# Patient Record
Sex: Female | Born: 1950 | Race: White | Hispanic: No | Marital: Single | Smoking: Never smoker
Health system: Southern US, Community
[De-identification: ages and names within clinical notes are randomized; demographics above are authoritative.]

## PROBLEM LIST (undated history)

## (undated) DIAGNOSIS — K529 Noninfective gastroenteritis and colitis, unspecified: Secondary | ICD-10-CM

## (undated) DIAGNOSIS — I272 Pulmonary hypertension, unspecified: Secondary | ICD-10-CM

## (undated) DIAGNOSIS — Z8719 Personal history of other diseases of the digestive system: Secondary | ICD-10-CM

## (undated) DIAGNOSIS — M1990 Unspecified osteoarthritis, unspecified site: Secondary | ICD-10-CM

## (undated) DIAGNOSIS — E079 Disorder of thyroid, unspecified: Secondary | ICD-10-CM

## (undated) DIAGNOSIS — C801 Malignant (primary) neoplasm, unspecified: Secondary | ICD-10-CM

## (undated) DIAGNOSIS — I509 Heart failure, unspecified: Secondary | ICD-10-CM

## (undated) DIAGNOSIS — J45909 Unspecified asthma, uncomplicated: Secondary | ICD-10-CM

## (undated) DIAGNOSIS — I77819 Aortic ectasia, unspecified site: Secondary | ICD-10-CM

## (undated) DIAGNOSIS — I480 Paroxysmal atrial fibrillation: Secondary | ICD-10-CM

## (undated) DIAGNOSIS — I77810 Thoracic aortic ectasia: Secondary | ICD-10-CM

## (undated) DIAGNOSIS — K219 Gastro-esophageal reflux disease without esophagitis: Secondary | ICD-10-CM

## (undated) DIAGNOSIS — I10 Essential (primary) hypertension: Secondary | ICD-10-CM

## (undated) DIAGNOSIS — E0590 Thyrotoxicosis, unspecified without thyrotoxic crisis or storm: Secondary | ICD-10-CM

## (undated) HISTORY — DX: Thyrotoxicosis, unspecified without thyrotoxic crisis or storm: E05.90

## (undated) HISTORY — DX: Paroxysmal atrial fibrillation: I48.0

## (undated) HISTORY — PX: OTHER SURGICAL HISTORY: SHX169

## (undated) HISTORY — DX: Pulmonary hypertension, unspecified: I27.20

## (undated) HISTORY — DX: Thoracic aortic ectasia: I77.810

## (undated) HISTORY — DX: Aortic ectasia, unspecified site: I77.819

---
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
## 1998-04-22 ENCOUNTER — Ambulatory Visit (HOSPITAL_COMMUNITY): Admission: RE | Admit: 1998-04-22 | Discharge: 1998-04-22 | Payer: Self-pay | Admitting: Obstetrics & Gynecology

## 1998-06-17 ENCOUNTER — Other Ambulatory Visit: Admission: RE | Admit: 1998-06-17 | Discharge: 1998-06-17 | Payer: Self-pay | Admitting: Obstetrics & Gynecology

## 1998-08-04 ENCOUNTER — Emergency Department (HOSPITAL_COMMUNITY): Admission: EM | Admit: 1998-08-04 | Discharge: 1998-08-04 | Payer: Self-pay

## 1998-08-08 ENCOUNTER — Encounter: Payer: Self-pay | Admitting: Internal Medicine

## 1999-01-07 ENCOUNTER — Other Ambulatory Visit: Admission: RE | Admit: 1999-01-07 | Discharge: 1999-01-07 | Payer: Self-pay | Admitting: Obstetrics & Gynecology

## 1999-06-17 ENCOUNTER — Ambulatory Visit (HOSPITAL_COMMUNITY): Admission: RE | Admit: 1999-06-17 | Discharge: 1999-06-17 | Payer: Self-pay | Admitting: Obstetrics & Gynecology

## 1999-06-17 ENCOUNTER — Encounter: Payer: Self-pay | Admitting: Obstetrics & Gynecology

## 1999-12-04 ENCOUNTER — Other Ambulatory Visit: Admission: RE | Admit: 1999-12-04 | Discharge: 1999-12-04 | Payer: Self-pay | Admitting: Obstetrics & Gynecology

## 1999-12-04 ENCOUNTER — Encounter (INDEPENDENT_AMBULATORY_CARE_PROVIDER_SITE_OTHER): Payer: Self-pay

## 1999-12-20 ENCOUNTER — Emergency Department (HOSPITAL_COMMUNITY): Admission: EM | Admit: 1999-12-20 | Discharge: 1999-12-20 | Payer: Self-pay | Admitting: Emergency Medicine

## 1999-12-23 ENCOUNTER — Encounter (HOSPITAL_COMMUNITY): Admission: RE | Admit: 1999-12-23 | Discharge: 1999-12-27 | Payer: Self-pay | Admitting: Emergency Medicine

## 2000-01-03 ENCOUNTER — Encounter (HOSPITAL_COMMUNITY): Admission: RE | Admit: 2000-01-03 | Discharge: 2000-01-03 | Payer: Self-pay | Admitting: Emergency Medicine

## 2000-01-17 ENCOUNTER — Emergency Department (HOSPITAL_COMMUNITY): Admission: EM | Admit: 2000-01-17 | Discharge: 2000-01-17 | Payer: Self-pay

## 2000-09-08 ENCOUNTER — Emergency Department (HOSPITAL_COMMUNITY): Admission: EM | Admit: 2000-09-08 | Discharge: 2000-09-09 | Payer: Self-pay | Admitting: Emergency Medicine

## 2000-09-08 ENCOUNTER — Encounter: Payer: Self-pay | Admitting: Emergency Medicine

## 2000-10-12 ENCOUNTER — Other Ambulatory Visit: Admission: RE | Admit: 2000-10-12 | Discharge: 2000-10-12 | Payer: Self-pay | Admitting: Obstetrics & Gynecology

## 2001-10-18 ENCOUNTER — Other Ambulatory Visit: Admission: RE | Admit: 2001-10-18 | Discharge: 2001-10-18 | Payer: Self-pay | Admitting: Obstetrics & Gynecology

## 2004-04-16 ENCOUNTER — Inpatient Hospital Stay (HOSPITAL_COMMUNITY): Admission: EM | Admit: 2004-04-16 | Discharge: 2004-04-22 | Payer: Self-pay | Admitting: Emergency Medicine

## 2004-04-16 ENCOUNTER — Encounter (INDEPENDENT_AMBULATORY_CARE_PROVIDER_SITE_OTHER): Payer: Self-pay | Admitting: *Deleted

## 2004-04-16 IMAGING — CR DG ABDOMEN ACUTE W/ 1V CHEST
3 series · 3 of 3 positions shown · non-contrast
Comparison: none

CLINICAL DATA: Nausea, vomiting, abdominal pain.
 ACUTE ABDOMEN SERIES WITH CHEST 
 The bowel gas pattern is normal.  There is no free peritoneal air.  The soft tissues have a normal appearance.  The upright chest view included with the study demonstrates borderline cardiac size.  There are no infiltrative or edematous changes. 
 IMPRESSION
 Borderline cardiac size.  Normal bowel gas pattern and no evidence for free peritoneal air.

[view not recorded (1 of 3)]
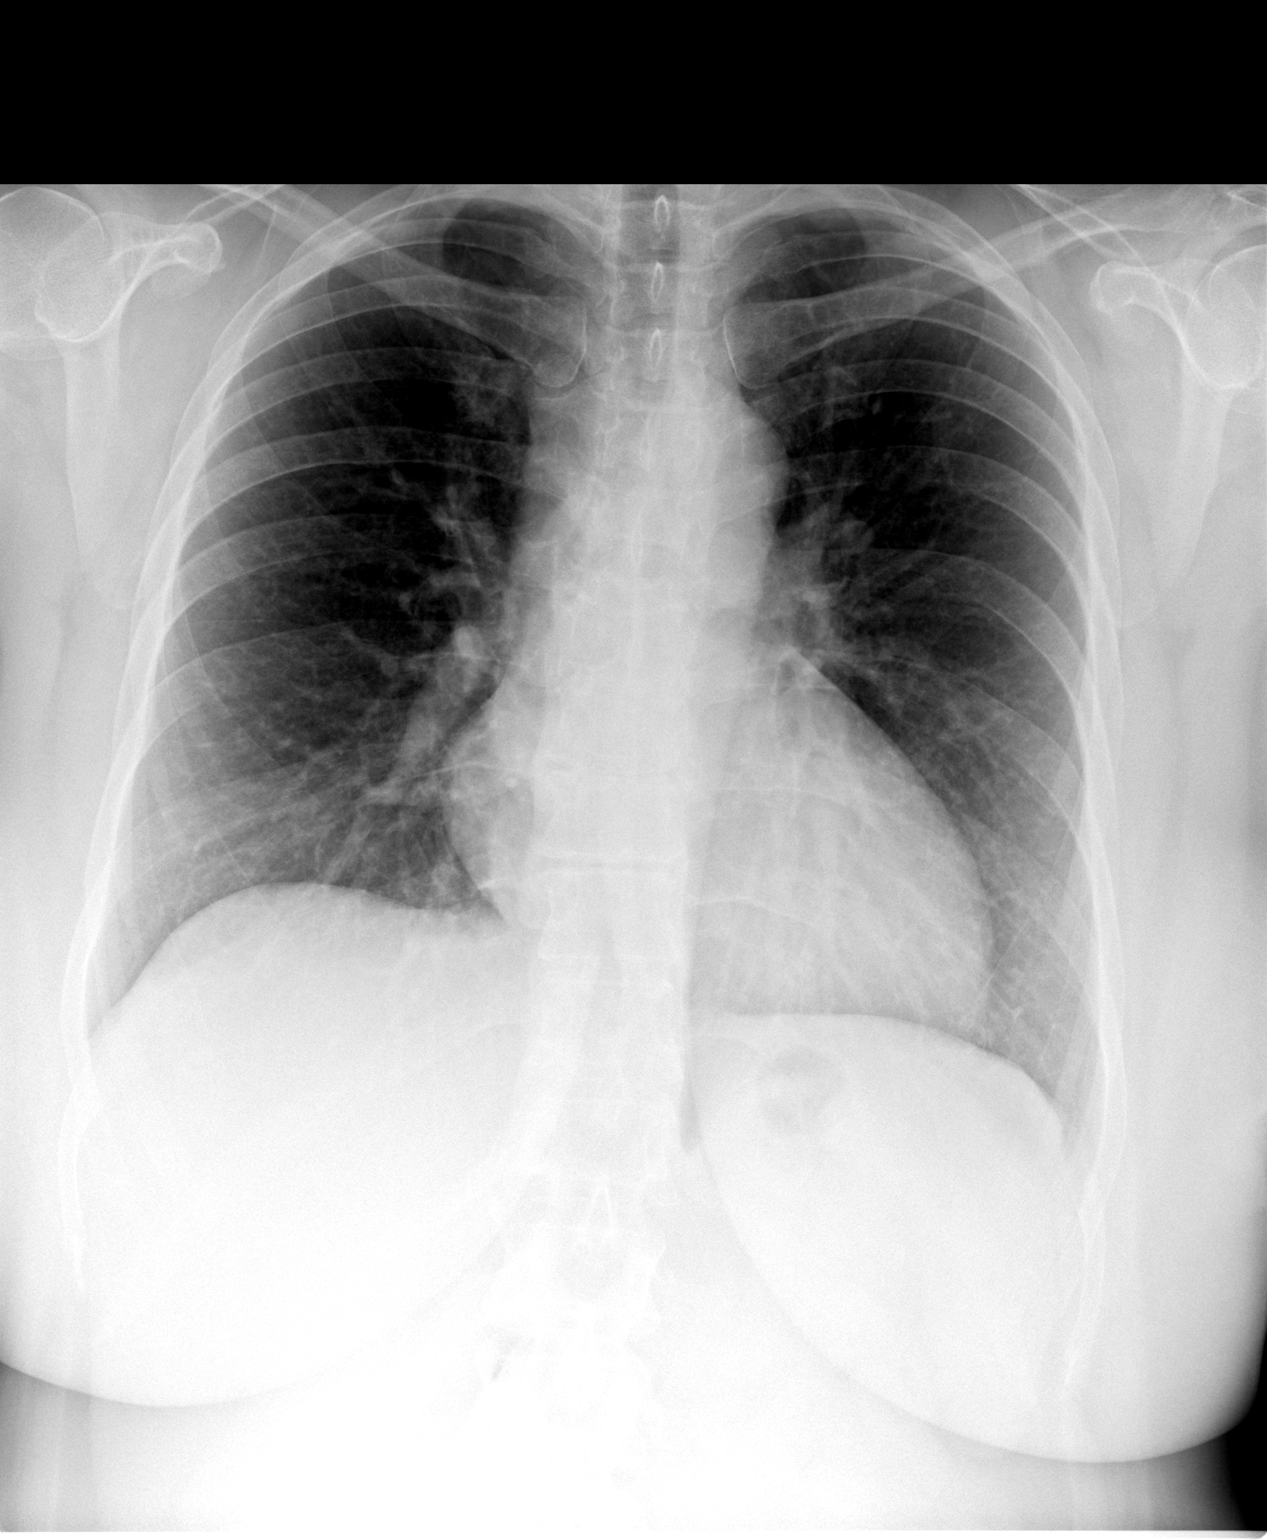

[view not recorded (2 of 3)]
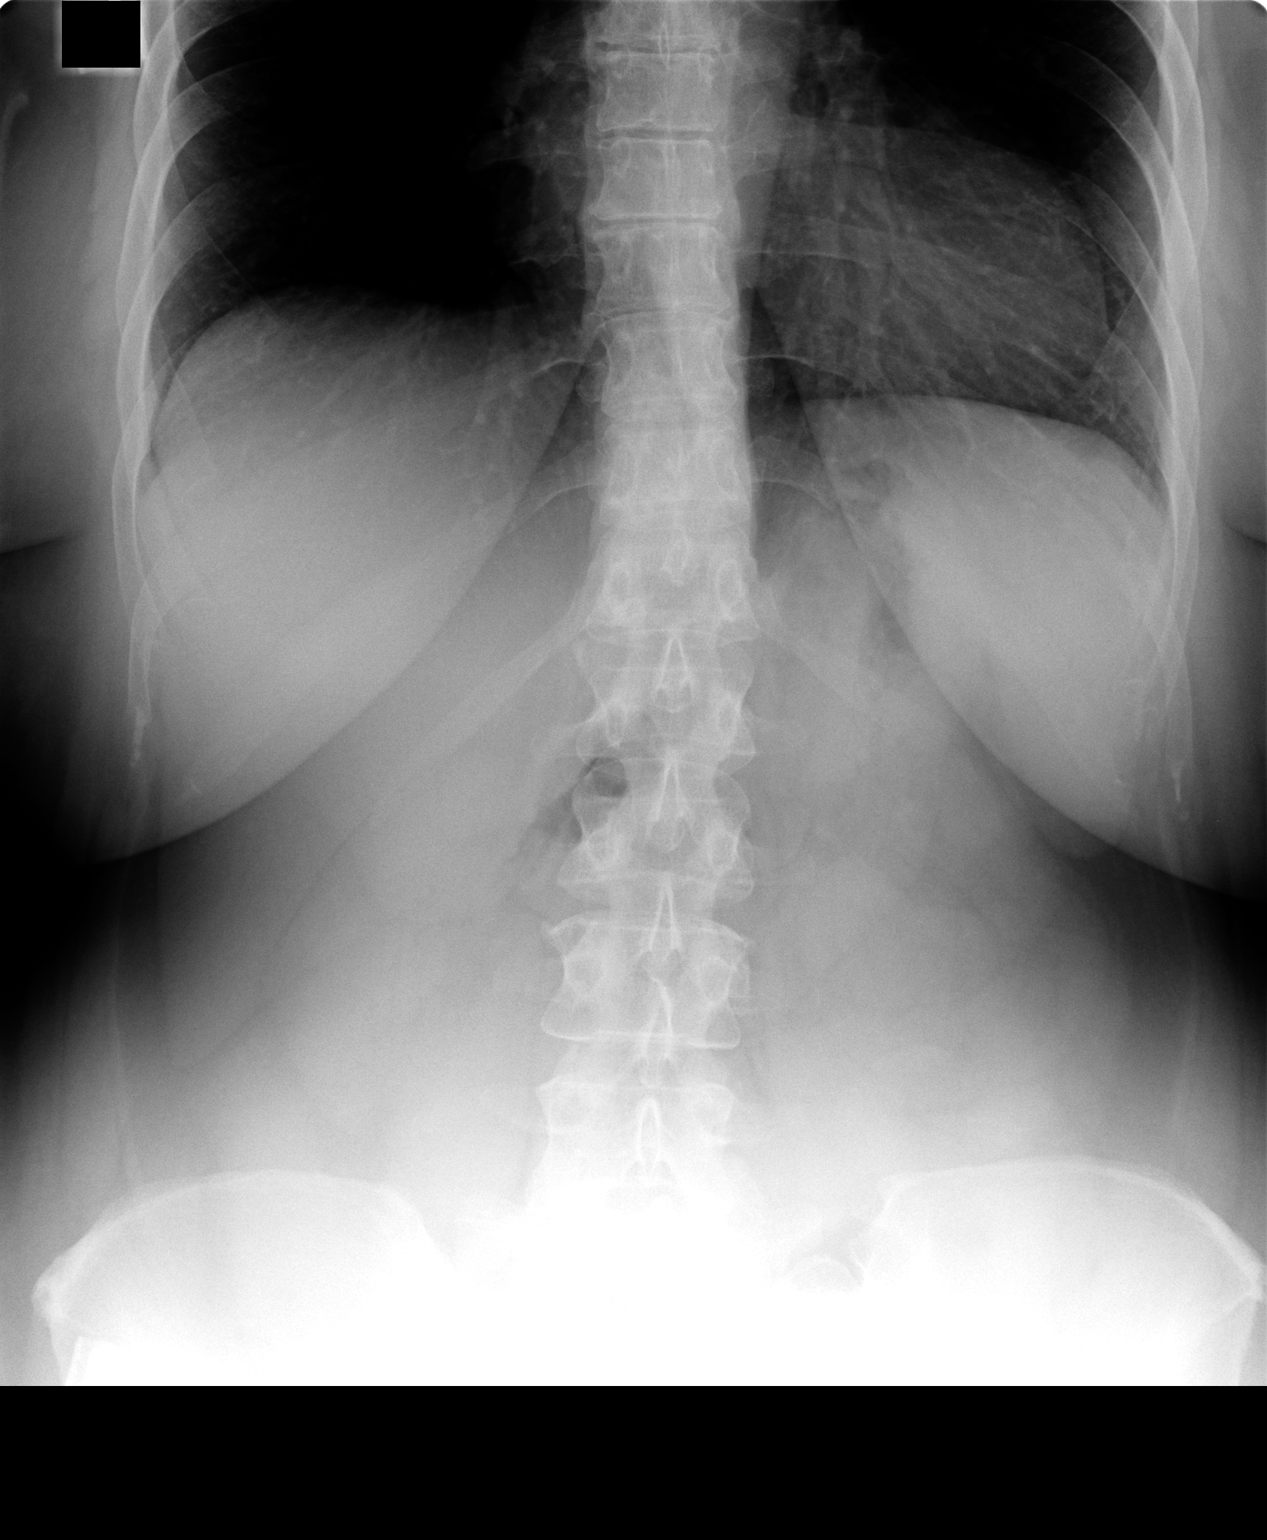

[view not recorded (3 of 3)]
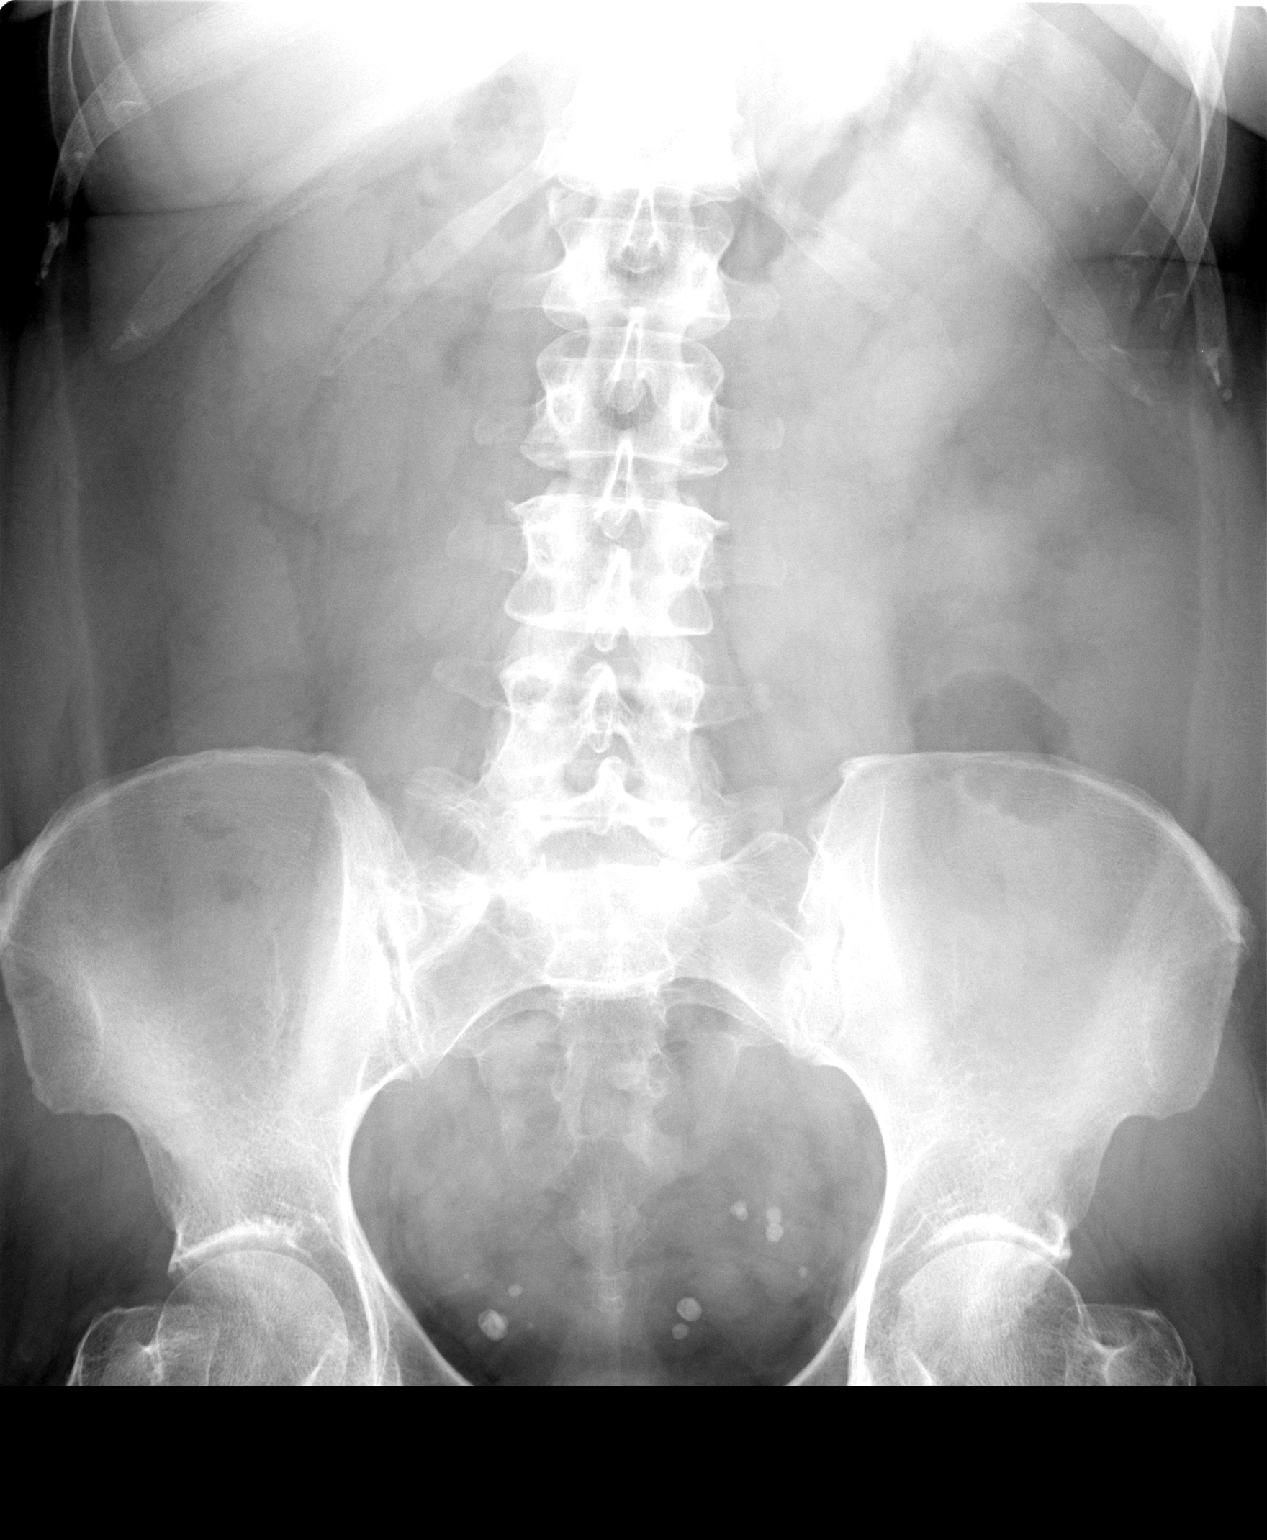

[3 of 3 positions shown; findings below may reference images not displayed]

## 2004-04-16 IMAGING — CT CT PELVIS W/ CM
1 of 6 series · 13 of 32 positions shown, 18 images · IV contrast (omnipaque)
Comparison: none

CLINICAL DATA: Marked diarrhea, diffuse abdominal pain, low grade fever.  Please evaluate.
 CT OF THE ABDOMEN WITH IV CONTRAST ? [DATE].
 Multidetector helical study was performed during IV contrast enhancement with 150 cc of Omnipaque 300.   
 There are two small cysts seen associated with the lower poles of the kidneys.  The kidneys otherwise have a normal appearance.  The liver, spleen, pancreas, and adrenal glands have a normal appearance.  
 There is bowel wall thickening associated with the right colon, particularly the cecum, and extending into the hepatic flexure region.  There is no contrast within the transverse colon and I am uncertain as to whether there is bile wall thickening within the transverse colon as well.  Significant bowel wall thickening is not see associated with the terminal ileum.  There are several mildly enlarged mesenteric lymph nodes seen medial to the cecum.  A lymph node measuring 6 x 17 mm in size is seen medial to the cecum on image sequence #53.  In this size range these may well represent reactive lymph nodes.
 IMPRESSION
 1.  Bowel wall thickening associated with the right colon and possibly extending into the transverse colon.  There is relative sparing of the terminal ileum and these changes favor ulcerative colitis.  The patient gives no history of antibiotic use to suggest pseudomembranous colitis.  
 2.  Several small lower pole right renal cysts.
 CT OF THE PELVIS WITH IV CONTRAST:
 Bowel wall thickening is seen associated with the cecum and right colon.  Please see CT of the abdomen for findings related to this.  There is no pelvic mass or adenopathy.  There is no evidence for diverticulitis.
 1.  Bowel wall thickening associated with the right colon.  Please see CT abdomen report for discussion with regards to these findings.
 2.  There are mildly enlarged mesenteric lymph nodes seen medial to the right colon most likely representing reactive lymph nodes.

[Series 5: — · axial · 0.63mm/px · z∈[-676,-282]mm · 13 of 91 slices shown, 18 images]
[im 6/91  soft-tissue]
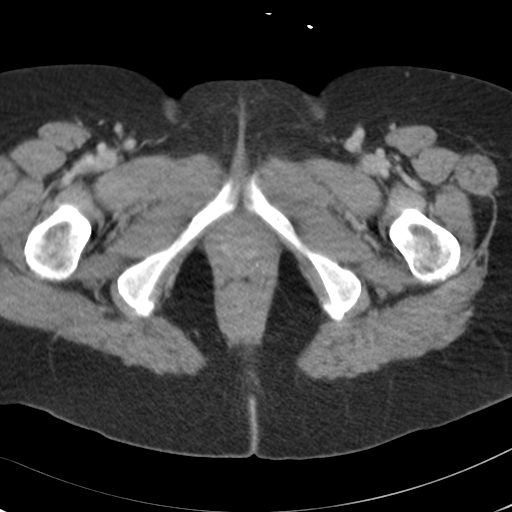
[im 6/91  bone]
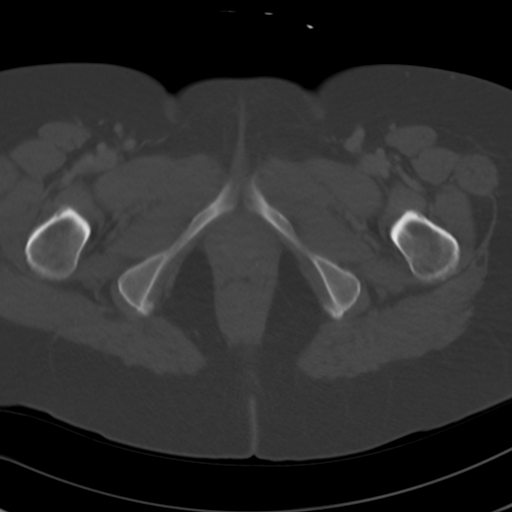
[im 12/91  soft-tissue]
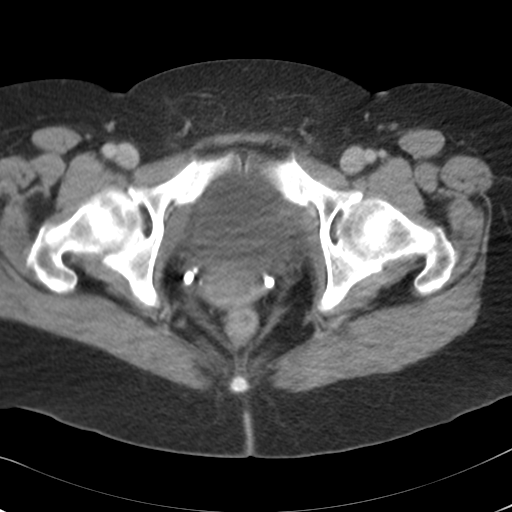
[im 23/91  soft-tissue]
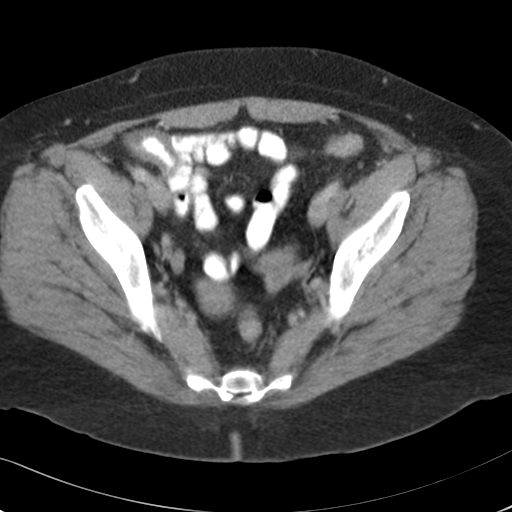
[im 29/91  soft-tissue]
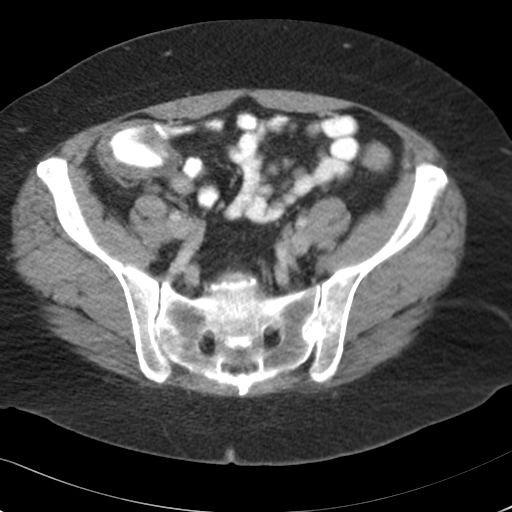
[im 34/91  soft-tissue]
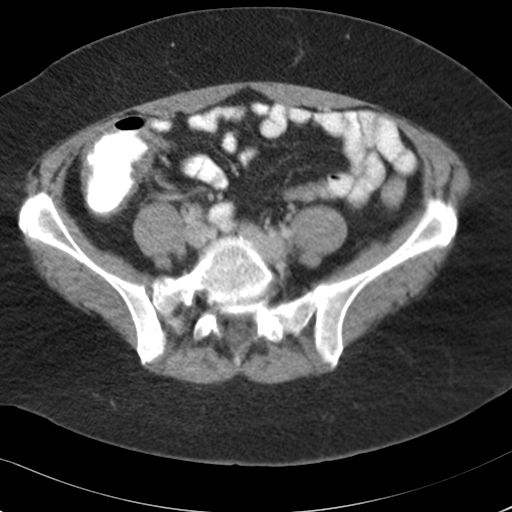
[im 40/91  soft-tissue]
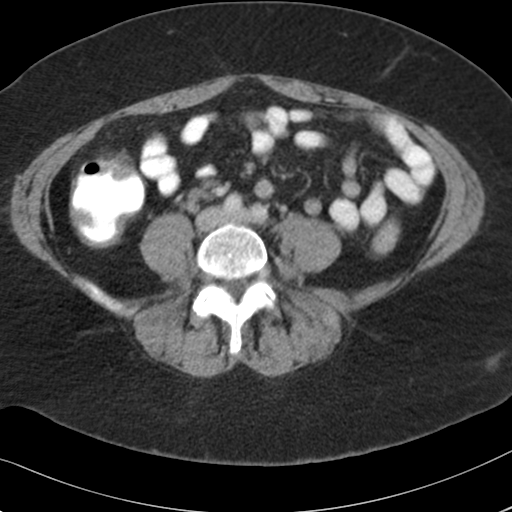
[im 51/91  soft-tissue]
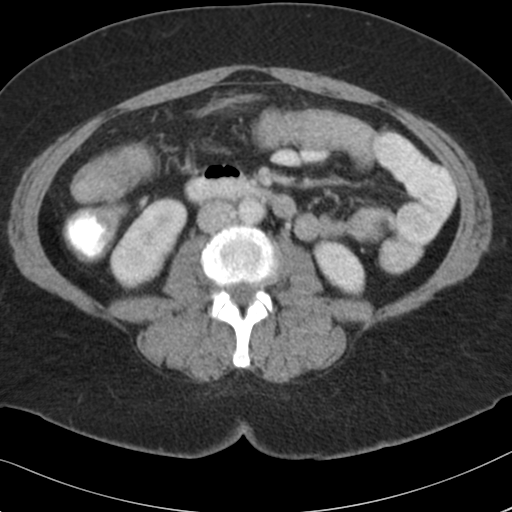
[im 57/91  soft-tissue]
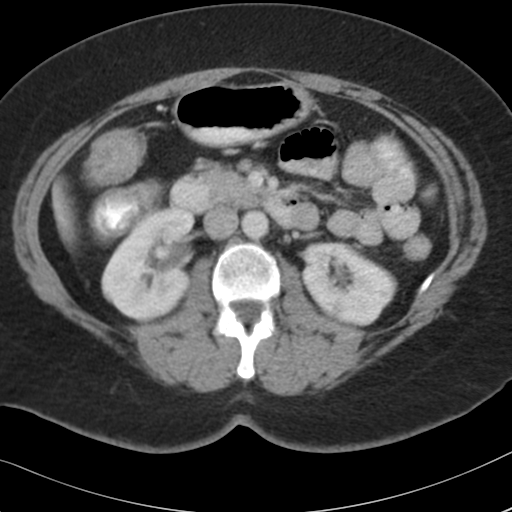
[im 62/91  soft-tissue]
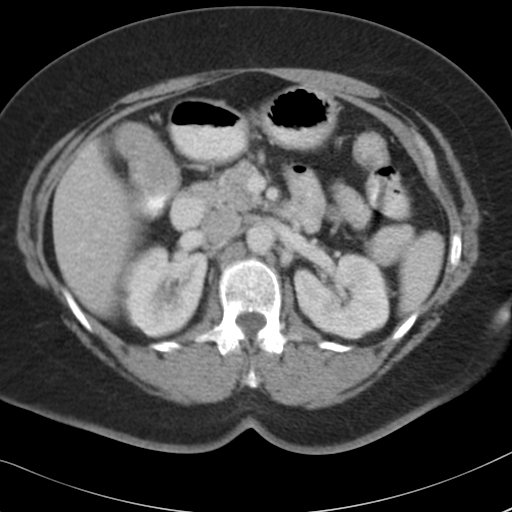
[im 62/91  bone]
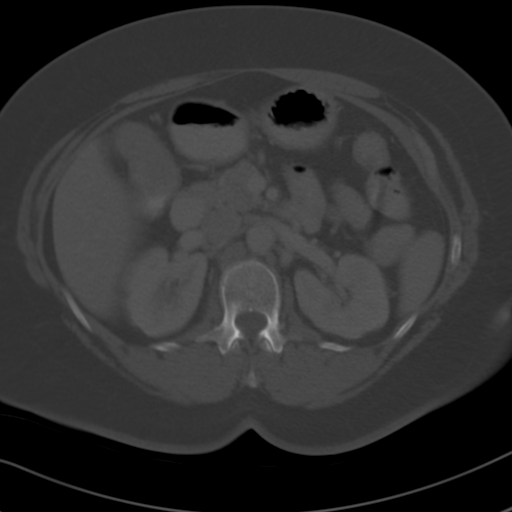
[im 68/91  soft-tissue]
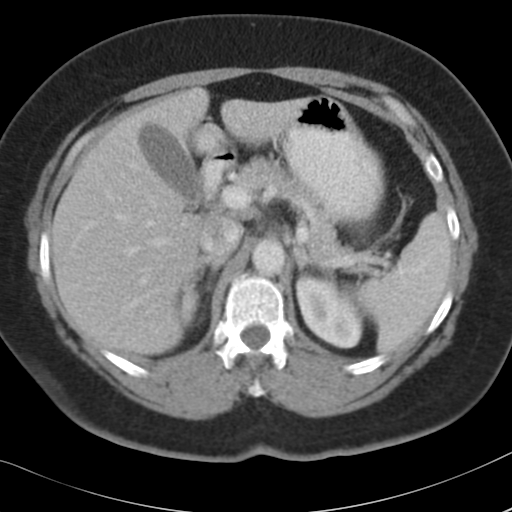
[im 68/91  lung]
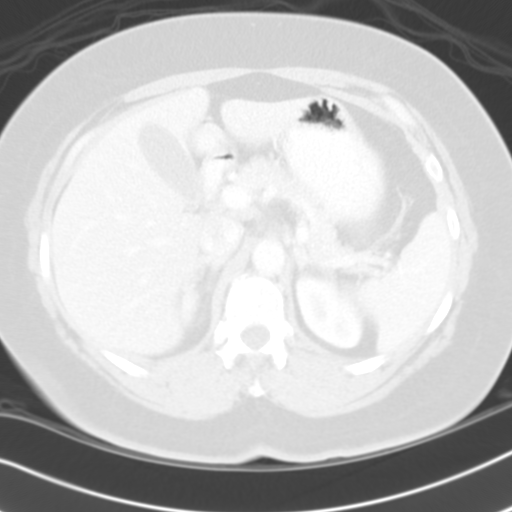
[im 74/91  lung]
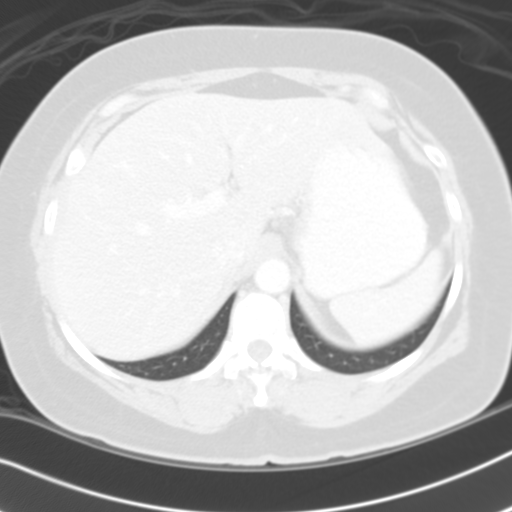
[im 79/91  soft-tissue]
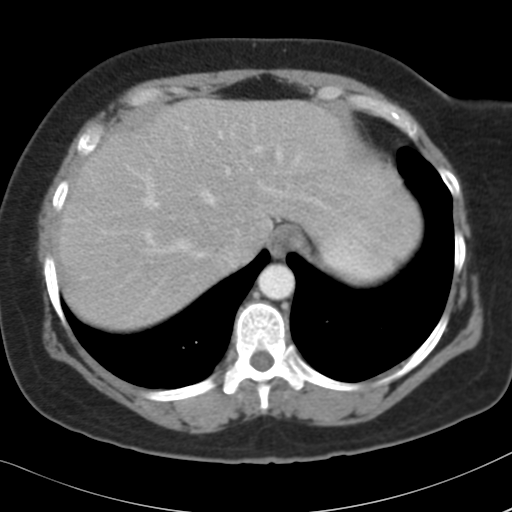
[im 79/91  lung]
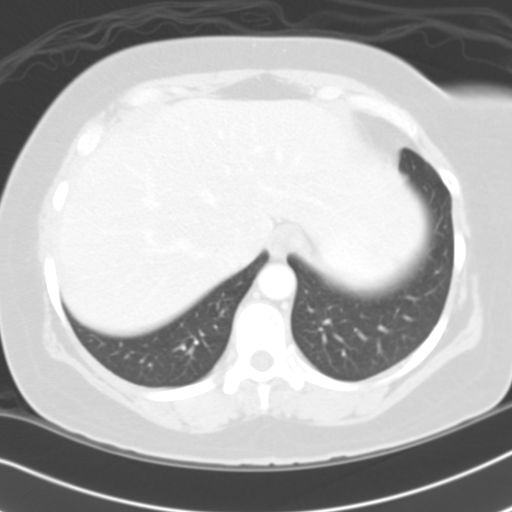
[im 85/91  soft-tissue]
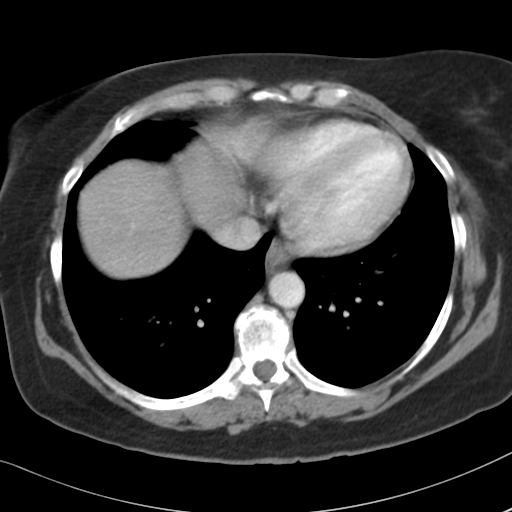
[im 85/91  lung]
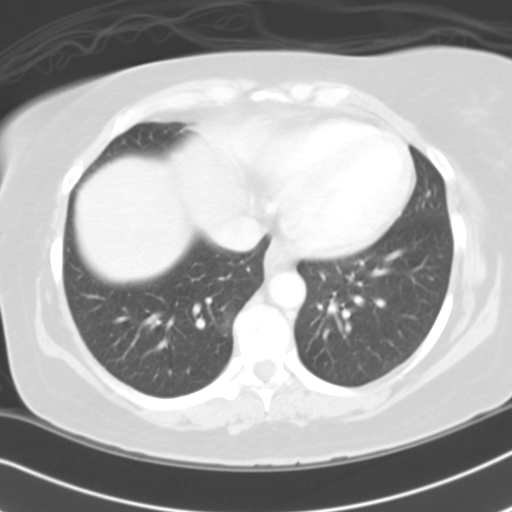

[13 of 32 positions shown; findings below may reference images not displayed]

## 2004-04-22 ENCOUNTER — Encounter (INDEPENDENT_AMBULATORY_CARE_PROVIDER_SITE_OTHER): Payer: Self-pay | Admitting: *Deleted

## 2004-04-22 ENCOUNTER — Encounter: Payer: Self-pay | Admitting: Internal Medicine

## 2004-05-15 ENCOUNTER — Encounter: Payer: Self-pay | Admitting: Internal Medicine

## 2010-08-20 ENCOUNTER — Telehealth: Payer: Self-pay | Admitting: Internal Medicine

## 2010-08-20 ENCOUNTER — Ambulatory Visit: Payer: Self-pay | Admitting: Gastroenterology

## 2010-08-20 DIAGNOSIS — K625 Hemorrhage of anus and rectum: Secondary | ICD-10-CM | POA: Insufficient documentation

## 2010-08-20 DIAGNOSIS — K648 Other hemorrhoids: Secondary | ICD-10-CM | POA: Insufficient documentation

## 2010-08-21 LAB — CONVERTED CEMR LAB
ALT: 21 units/L (ref 0–35)
AST: 18 units/L (ref 0–37)
Albumin: 4.5 g/dL (ref 3.5–5.2)
Alkaline Phosphatase: 99 units/L (ref 39–117)
BUN: 13 mg/dL (ref 6–23)
Basophils Absolute: 0 10*3/uL (ref 0.0–0.1)
Basophils Relative: 0.5 % (ref 0.0–3.0)
CO2: 30 meq/L (ref 19–32)
CRP, High Sensitivity: 3.37 (ref 0.00–5.00)
Calcium: 9.7 mg/dL (ref 8.4–10.5)
Chloride: 100 meq/L (ref 96–112)
Creatinine, Ser: 0.9 mg/dL (ref 0.4–1.2)
Eosinophils Absolute: 0.1 10*3/uL (ref 0.0–0.7)
Eosinophils Relative: 1.9 % (ref 0.0–5.0)
GFR calc non Af Amer: 66.06 mL/min (ref >60)
Glucose, Bld: 89 mg/dL (ref 70–99)
HCT: 40.4 % (ref 36.0–46.0)
Hemoglobin: 13.9 g/dL (ref 12.0–15.0)
Lymphocytes Relative: 28.8 % (ref 12.0–46.0)
Lymphs Abs: 1.9 10*3/uL (ref 0.7–4.0)
MCHC: 34.5 g/dL (ref 30.0–36.0)
MCV: 93.1 fL (ref 78.0–100.0)
Monocytes Absolute: 0.6 10*3/uL (ref 0.1–1.0)
Monocytes Relative: 8.5 % (ref 3.0–12.0)
Neutro Abs: 4 10*3/uL (ref 1.4–7.7)
Neutrophils Relative %: 60.3 % (ref 43.0–77.0)
Platelets: 302 10*3/uL (ref 150.0–400.0)
Potassium: 4.5 meq/L (ref 3.5–5.1)
RBC: 4.34 M/uL (ref 3.87–5.11)
RDW: 13.8 % (ref 11.5–14.6)
Sed Rate: 10 mm/hr (ref 0–22)
Sodium: 138 meq/L (ref 135–145)
Total Bilirubin: 0.5 mg/dL (ref 0.3–1.2)
Total Protein: 7.3 g/dL (ref 6.0–8.3)
WBC: 6.7 10*3/uL (ref 4.5–10.5)

## 2010-08-26 DIAGNOSIS — K5289 Other specified noninfective gastroenteritis and colitis: Secondary | ICD-10-CM

## 2010-08-26 DIAGNOSIS — Z8719 Personal history of other diseases of the digestive system: Secondary | ICD-10-CM | POA: Insufficient documentation

## 2010-08-26 DIAGNOSIS — M1990 Unspecified osteoarthritis, unspecified site: Secondary | ICD-10-CM

## 2010-08-26 DIAGNOSIS — J45909 Unspecified asthma, uncomplicated: Secondary | ICD-10-CM | POA: Insufficient documentation

## 2010-08-26 DIAGNOSIS — H409 Unspecified glaucoma: Secondary | ICD-10-CM | POA: Insufficient documentation

## 2010-09-01 ENCOUNTER — Ambulatory Visit: Payer: Self-pay | Admitting: Internal Medicine

## 2010-10-12 ENCOUNTER — Encounter: Payer: Self-pay | Admitting: Emergency Medicine

## 2010-10-21 NOTE — Assessment & Plan Note (Signed)
Summary: Gastroenterology  Christina [name]   MR#:  [id number] Page #  NAME:  Christina [name], Christina [name]    OFFICE NO:  [id number]  DATE:  05/15/04  DOB:  12-25-2050  The patient returns after hospital visit.  She was hospitalized for acute colitis.  The etiology was not clear.  Inflammatory bowel disease was suspected.  On colonoscopy on April 22, 2004, she had scattered ulcerations throughout the colon which were quite nonspecific.  She was treated for suspected Crohn's disease with mesalamine            1.2 grams 3 times a day and with antibiotics.  After about a week of treatment, she started feeling back to having abdominal pain and diarrhea.  We sent her Cipro 250 mg p.o. b.i.d. which resulted in marked improvement of all of her condition.  Her temperature has decreased, and her diarrhea stopped; and for the past several days, she has been having normal bowel movements.  Biopsies of the colon were quite nonspecific.  It showed focal cryptitis and generally suspected infectious colitis rather than Crohn's disease.    PHYSICAL EXAMINATION:  Blood pressure 142/100, pulse 78, weight 210 pounds which represents about 15 pound weight loss.  Her abdominal exam today was unremarkable.  She had minimal discomfort in the right lower quadrant but normal upper abdomen and left side of the abdomen.  Rectal exam was not repeated.    IMPRESSION:  A 60 year old white female with acute colitis most likely infectious in etiology, although it is not still completely clear.  It appears that the most helpful medication has been the antibiotic.  The stool cultures have been negative, but I still suspect we are dealing with infectious cause.    PLAN:   1.  Continue Cipro until completed which will be a total of 10 days.  2.  Decrease Asacol to 800 mg p.o. b.i.d. for another 4 weeks, then discontinue.   3.  Increase the roughage in her diet.  She is currently on a low-residue diet. 4.  Call us if the symptoms recur.       Hedwig Morton. Juanda Chance,  M.D.  [id number] D:  05/15/04; T:  ; Job #[id number]

## 2010-10-21 NOTE — Procedures (Signed)
Summary: Colonoscopy   Colonoscopy  Procedure date:  04/22/2004  Findings:      Results: Hemorrhoids.     Results: Colitis.       Location:  [facility].    Procedures Next Due Date:    Colonoscopy: 04/2009  Patient Name: Christina [name], Christina [name] MRN: [id number] Procedure Procedures: Colonoscopy CPT: 54098.    with multiple biopsies. CPT: [phone]. There were greater than 10 biopsies taken.  Personnel: Endoscopist: Dora L. Juanda Chance, MD.  Exam Location: Exam performed in Endoscopy Suite.  Patient Consent: Procedure, Alternatives, Risks and Benefits discussed, consent obtained,  Indications  Evaluation of: Inflammatory Bowel Disease. Infectious Colitis.  Symptoms: Diarrhea Patient is having increased frequency of stools. Hematochezia. Abdominal pain / bloating.  History  Current Medications: Patient is not currently taking Coumadin.  Pre-Exam Physical: Performed Apr 22, 2004. Entire physical exam was normal.  Exam Exam: Extent of exam reached: Cecum, extent intended: Cecum.  The cecum was identified by appendiceal orifice and IC valve. Colon retroflexion performed. Images taken. ASA Classification: I. Tolerance: good.  Monitoring: Pulse and BP monitoring, Oximetry used. Supplemental O2 given.  Colon Prep Used Miralax for colon prep. Prep results: good.  Fluoroscopy: Fluoroscopy was not used.  Sedation Meds: Fentanyl 100 mcg. given IV. Versed 9 mg. given IV.  Findings MISCELLANEOUS COLITIS: Ascending Colon to Transverse Colon. Erythema present. Erosions present, bleeding absent, Activity level mild, aphthous ulcers present. Biopsy/Mucosal Abn. taken. ICD9: Colitis, Unspecified: 558.9.  MISCELLANEOUS COLITIS: Cecum to Sigmoid Colon. Erosions present, taken. ICD9: Colitis, Unspecified: 558.9. Comments: 2-3 mm superficial circular ulcers scattered truout the colon, sparing rectu and cecal pouch, more severe in hepatic flexure, no active bleeding.  - HEMORRHOIDS:  Internal. Size: Grade I. Not bleeding. ICD9: Hemorrhoids, Internal: 455.0.   Assessment Abnormal examination, see findings above.  Diagnoses: 558.9: Colitis, Unspecified.  455.0: Hemorrhoids, Internal.   Comments: s/p random biopsies of the R and L colon Comments: mild resolving colitis, appears nonspecific Events  Unplanned Interventions: No intervention was required.  Unplanned Events: There were no complications. Plans Medication Plan: Await pathology. 5-ASA: Asacol 1200 QOD, starting Apr 22, 2004  Antispasmodics: Hyoscyamine SL 0.125 mg ac, starting Apr 22, 2004   Patient Education: Patient given standard instructions for: Yearly hemoccult testing recommended. Patient instructed to get routine colonoscopy every 5 years.  Comments: low residue diet Disposition: After procedure patient sent to recovery.    cc: Crista Curb, MD  This report was created from the original endoscopy report, which was reviewed and signed by the above listed endoscopist.

## 2010-10-21 NOTE — Procedures (Signed)
Summary: EGD  EGD   Imported By: Lamona Curl CMA (AAMA) 08/27/2010 16:18:32  _____________________________________________________________________  External Attachment:    Type:   Image     Comment:   External Document

## 2010-10-21 NOTE — Discharge Summary (Signed)
Summary: Colitis   NAME:  Christina [name], Christina [name]                             ACCOUNT NO.:  [id number]   MEDICAL RECORD NO.:  [id number]                   PATIENT TYPE:  INP   LOCATION:  0340                                 FACILITY:  [facility]   PHYSICIAN:  Corinna L. Lendell Caprice, MD             DATE OF BIRTH:  October 12, 1950   DATE OF ADMISSION:  04/16/2004  DATE OF DISCHARGE:  04/22/2004                                 DISCHARGE SUMMARY   DIAGNOSES:  1. Colitis, rule out Crohn's.  2. Osteoarthritis.  3. History of asthma.  4. History of hypertension.  5. Ocular hypertension.   DISCHARGE MEDICATIONS:  1. Phenergan 25 mg p.o. q.6h. p.r.n. nausea or vomiting.  2. Asacol 400 mg three tablets t.i.d.  3. Levsin 0.125 mg sublingually q.a.c. p.r.n. cramping.  4. She may continue her other outpatient medications which include aspirin a     day.  5. Flexeril as needed.   FOLLOWUP:  She is to follow up with Dr. Juanda Chance in three weeks.  She is to  follow up with Dr. Dagoberto Ligas for eye examination.   ACTIVITY:  She may return to work next week.   DIET:  Low residue.  She may take Tylenol as needed for pain or fever.   CONSULTS:  Dr. Lina Sar   PROCEDURE:  Colonoscopy with biopsy showing nonspecific colitis, aphthous  ulcer, and hemorrhoids.   SPECIAL STUDIES AND RADIOLOGY:  Ova/parasites of the stool negative.  C.  difficile negative.  Stool culture negative.  UA negative for nitrites or  leukocyte esterase.  Specific gravity was greater than 1.040 and trace  ketones.  Complete metabolic panel was significant for a potassium of 3.0,  otherwise fairly unremarkable.  Lipase was 21.  White count on admission was  15,000 with 94% neutrophils.  At discharge her white count had normalized  and her hemoglobin was 11.5, hematocrit 34.7.  Acute abdominal series showed  nothing acute.  CT of the abdomen and pelvis showed thickening of the right  colon bowel wall possibly extending into the transverse  colon.   HISTORY AND HOSPITAL COURSE:  Christina [name] is a very pleasant 60 year old white  female who had been vomiting, having abdominal pain and bloody diarrhea and  presented to the emergency room.  She was seen in her primary care  physician's office and treated empirically for diverticulitis with Cipro but  at that time she had no bleeding per rectum.  She has reported bloody  mucousy stools in the past, but apparently has never had a colonoscopy.  She  had an EGD by Dr. Victorino Dike about 10 years ago.  Her mother had ulcerative  colitis in her 67s.  On examination the patient had a heart rate of 119,  otherwise her vitals were normal.  She appeared in no acute distress.  She  had soft, tender abdomen on the right.  No  rebound tenderness.  She had a  CAT scan which was suggestive of a colitis.  The patient had serial H&Hs  which remained stable.  Her bloody diarrhea resolved and she was started on  empiric IV ciprofloxacin and Flagyl.  She was initially nothing by mouth but  her diet was able to be advanced.  She had vomiting for quite some time  despite resolution of the diarrhea.  She also continued to have right-sided  abdominal pain and some headaches.  I was concerned that the antibiotics may  be causing the vomiting and they were held.  Her vomiting did improve once  these were stopped.  [facility] was consulted and Dr. Juanda Chance performed the  above colonoscopy.  Biopsies of the colon are at this time pending.  She is  started empirically on Asacol pending these results and is to follow up with  her as an outpatient.                                               Corinna L. Lendell Caprice, MD    CLS/MEDQ  D:  04/22/2004  T:  04/22/2004  Job:  [id number]   cc:   Lina Sar, M.D. [facility], M.D.   Oley Balm Georgina Pillion, M.D.  [address]  [place]  [place] [address]  Fax: [phone]

## 2010-10-21 NOTE — Assessment & Plan Note (Signed)
Summary: rectal bleeding/Christina [name]   History of Present Illness Visit Type: Initial Visit Primary GI MD: Lina Sar MD Primary Provider: Lajean Manes, MD Chief Complaint: rectal bleed, BRB on tissue & in toilet; started as bloody mucous- nonw it is blood only in toilet History of Present Illness:   Christina [name] last seen in 2005 when she was evaluated for hematochezia. Over the years she had had occasional rectal bleeding (small volume). One month ago Christina [name] had an episode of severe abdominal pain and since then has had bloody mucoid stools on a regular basis. Her stools are not loose, she hasn't had any further pain.  She takes Naproxen, no other NSAIDS.   GI Review of Systems    Reports abdominal pain, acid reflux, and  bloating.      Denies belching, chest pain, dysphagia with liquids, dysphagia with solids, heartburn, loss of appetite, nausea, vomiting, vomiting blood, weight loss, and  weight gain.      Reports black tarry stools, diarrhea, irritable bowel syndrome, rectal bleeding, and  rectal pain.     Denies anal fissure, change in bowel habit, constipation, diverticulosis, fecal incontinence, heme positive stool, hemorrhoids, jaundice, light color stool, and  liver problems. Preventive Screening-Counseling & Management  Alcohol-Tobacco     Smoking Status: never      Drug Use:  no.     Current Medications (verified): 1)  Bactrim 400-80 Mg Tabs (Sulfamethoxazole-Trimethoprim) .... Two Times A Day 2)  Loratadine-D 24hr 10-240 Mg Xr24h-Tab (Loratadine-Pseudoephedrine) .... As Needed 3)  Acetaminophen 500 Mg Caps (Acetaminophen) .... Take 2 Capsule Two Times A Day 4)  Ultimate Probiotic Formula  Caps (Lactobacillus) .... Once Daily 5)  Flaxseed Oil 1000 Mg Caps (Flaxseed (Linseed)) .... Take 1 Capsule By Mouth Three Times A Day 6)  Resvenatrol 10mg  .... Two Times A Day 7)  Hydrocortisone Acetate 25 Mg Supp (Hydrocortisone Acetate) .... Use Suppository Rectally in The Am and At  Bedtime  Allergies (verified): 1)  ! * Hctz  Past History:  Past Medical History: Arthritis Asthma Infectious Colitis GI Bleed Hypertension Peptic Ulcer Disease  Past Surgical History: Unremarkable  Family History: Family History of Diabetes:Brother Family History of Irritable Bowel Syndrome:Mother  Social History: Occupation:Socail Worker Christina [name] has never smoked.  Alcohol Use - no Daily Caffeine Use Illicit Drug Use - no Smoking Status:  never Drug Use:  no  Review of Systems       The Christina [name] complains of arthritis/joint pain.  The Christina [name] denies allergy/sinus, anemia, anxiety-new, back pain, blood in urine, breast changes/lumps, change in vision, confusion, cough, coughing up blood, depression-new, fainting, fatigue, fever, headaches-new, hearing problems, heart murmur, heart rhythm changes, itching, menstrual pain, muscle pains/cramps, night sweats, nosebleeds, pregnancy symptoms, shortness of breath, skin rash, sleeping problems, sore throat, swelling of feet/legs, swollen lymph glands, thirst - excessive , urination - excessive , urination changes/pain, urine leakage, vision changes, and voice change.    Vital Signs:  Christina [name] profile:   60 year old female Height:      61 inches Weight:      213.13 pounds BMI:     40.42 Pulse rate:   84 / minute Pulse rhythm:   regular BP sitting:   154 / 98  (left arm) Cuff size:   regular  Vitals Entered By: June McMurray CMA Duncan Dull) (August 20, 2010 3:18 PM)  Physical Exam  General:  Well developed, well nourished, no acute distress. Head:  Normocephalic and atraumatic. Neck:  no obvious masses  Lungs:  Clear  throughout to auscultation. Heart:  Regular rate and rhythm; no murmurs, rubs,  or bruits. Abdomen:  Abdomen soft, nontender, nondistended. No obvious masses or hepatomegaly.Normal bowel sounds.  Rectal:  Internal hemorrhoids on anoscopic exam. No external lesions Msk:  Symmetrical with no gross deformities.  Normal posture. Neurologic:  Alert and  oriented x4;  grossly normal neurologically. Skin:  Intact without significant lesions or rashes. Cervical Nodes:  No significant cervical adenopathy. Psych:  Alert and cooperative. Normal mood and affect.  Impression & Recommendations:  Problem # 1:  RECTAL BLEEDING (ICD-569.3) Assessment Deteriorated One month history of bloody, mucoid stools which started with episode of abdominal pain. No abdominal pain since that one episode. Her bleeding could be secondary to internal hemorrhoids but it should be noted that she had acute colitis diagnosed by colonoscopy 2005.  It has been 7 years since her colonoscopy and given her symptoms I feel Christina [name] should have it repeated. Christina [name] is reluctant to proceed  (woke up during last colonoscopy). She wants to try treating the hemorrhoids first. Will obtain basic labs, treat hemorrhoids, hold NSAID and have her return to see Dr. Juanda Chance in a few weeks.  Orders: TLB-CBC Platelet - w/Differential (85025-CBCD) TLB-CMP (Comprehensive Metabolic Pnl) (80053-COMP) TLB-CRP-High Sensitivity (C-Reactive Protein) (86140-FCRP) TLB-Sedimentation Rate (ESR) (85652-ESR)  Problem # 2:  HEMORRHOIDS-INTERNAL (ICD-455.0) Assessment: Deteriorated Treat with steroid suppositories.  Christina [name] Instructions: 1)  Please go to lab, basement level. 2)  We sent a prescription for Hydrocortisone suppositories to [facility] [address] in [place].  3)  Hold Naproxen at least until you next appt. with Dr. Juanda Chance. May use Tylenol instead. 4)  Copy sent to : Lajean Manes, MD 5)  We made you a follow up appointment with Dr. Juanda Chance for 09-04-2010 at 3:15PM.  Prescriptions: HYDROCORTISONE ACETATE 25 MG SUPP (HYDROCORTISONE ACETATE) Use Suppository rectally in the AM and at bedtime  #14 x 0   Entered by:   Lowry Ram NCMA   Authorized by:   Willette Cluster NP   Signed by:   Lowry Ram NCMA on 08/20/2010   Method used:   [facility] to        [facility]   [id number]       [address]       [place], [place]  [address]       Ph: [phone] or [phone]       Fax: [phone]   RxID:   [id number]

## 2010-10-21 NOTE — Procedures (Signed)
Summary: Colonoscopy   Patient Name: Christina, [name] MRN: [id number] Procedure Procedures: Colonoscopy CPT: 25366.    with multiple biopsies. CPT: [phone]. There were greater than 10 biopsies taken.  Personnel: Endoscopist: Deiondre Harrower L. Juanda Chance, MD.  Exam Location: Exam performed in Endoscopy Suite.  Patient Consent: Procedure, Alternatives, Risks and Benefits discussed, consent obtained,  Indications  Evaluation of: Inflammatory Bowel Disease. Infectious Colitis.  Symptoms: Diarrhea Patient is having increased frequency of stools. Hematochezia. Abdominal pain / bloating.  History  Current Medications: Patient is not currently taking Coumadin.  Pre-Exam Physical: Performed Apr 22, 2004. Entire physical exam was normal.  Exam Exam: Extent of exam reached: Cecum, extent intended: Cecum.  The cecum was identified by appendiceal orifice and IC valve. Colon retroflexion performed. Images taken. ASA Classification: I. Tolerance: good.  Monitoring: Pulse and BP monitoring, Oximetry used. Supplemental O2 given.  Colon Prep Used Miralax for colon prep. Prep results: good.  Fluoroscopy: Fluoroscopy was not used.  Sedation Meds: Fentanyl 100 mcg. given IV. Versed 9 mg. given IV.  Findings MISCELLANEOUS COLITIS: Ascending Colon to Transverse Colon. Erythema present. Erosions present, bleeding absent, Activity level mild, aphthous ulcers present. Biopsy/Mucosal Abn. taken. ICD9: Colitis, Unspecified: 558.9.  MISCELLANEOUS COLITIS: Cecum to Sigmoid Colon. Erosions present, taken. ICD9: Colitis, Unspecified: 558.9. Comments: 2-3 mm superficial circular ulcers scattered truout the colon, sparing rectu and cecal pouch, more severe in hepatic flexure, no active bleeding.  - HEMORRHOIDS: Internal. Size: Grade I. Not bleeding. ICD9: Hemorrhoids, Internal: 455.0.   Assessment Abnormal examination, see findings above.  Diagnoses: 558.9: Colitis, Unspecified.  455.0: Hemorrhoids,  Internal.   Comments: s/p random biopsies of the R and L colon Comments: mild resolving colitis, appears nonspecific Events  Unplanned Interventions: No intervention was required.  Unplanned Events: There were no complications. Plans Medication Plan: Await pathology. 5-ASA: Asacol 1200 QOD, starting Apr 22, 2004  Antispasmodics: Hyoscyamine SL 0.125 mg ac, starting Apr 22, 2004   Patient Education: Patient given standard instructions for: Yearly hemoccult testing recommended. Patient instructed to get routine colonoscopy every 5 years.  Comments: low residue diet Disposition: After procedure patient sent to recovery.       cc: Crista Curb, M.D. This report was created from the original endoscopy report, which was reviewed and signed by the above listed endoscopist. .

## 2010-10-21 NOTE — Progress Notes (Signed)
Summary: triage  Phone Note Call from Patient Call back at Home Phone [phone]   Caller: Patient Call For: Dr. Juanda Chance Reason for Call: Talk to Nurse Details for Reason: triage Summary of Call: Pt. hasn't been here in several years, but is a pt. of Dr. Juanda Chance.  She is having "severe rectal bleeding" and needs to be seen by someone. Initial call taken by: Schuyler Amor,  August 20, 2010 9:39 AM  Follow-up for Phone Call        Patient calling to report rectal bleeding off and on for a few weeks that was bloody mucous. For the last few days, the bleeding has increased. Bright red blood in stool and in toilet water.  Patient reports hx of colitis and abcess in 2005 which required hospitalizaton. Patient wants to be seen.. Scheduled with Willette Cluster, RNP today at 3:30 PM. Follow-up by: Jesse Fall RN,  August 20, 2010 9:54 AM  Additional Follow-up for Phone Call Additional follow up Details #1::        reviewed and agree. Additional Follow-up by: Hart Carwin MD,  August 20, 2010 10:06 PM

## 2010-10-23 NOTE — Assessment & Plan Note (Signed)
Summary: F/U Recta; b;eedomg. saw P.Guenther ACNP   History of Present Illness Visit Type: Follow-up Visit Primary GI MD: Lina Sar MD Primary Teyah Rossy: Lajean Manes, MD Chief Complaint: RECTAL BLEEDING HAS CLEARED UP History of Present Illness:   This is a 60 year old white female who is here for a followup visit from 08/20/10 at which time she had rectal bleeding. She was treated with Anusol-HC suppositories for this with complete resolution of her bleeding. She denies any diarrhea or constipation. Her hemoglobin was 13.9. Patient has a history of colitis for which she was hospitalized in August 2005. Her colonoscopy at that time showed acute cryptitis. Her white cell counts were 15,000 and a CT scan of the abdomen showed thickening of the right colon. She was treated with Cipro and Flagyl with complete resolution. Her mother had colitis when she was in her  her 31s. Pt  was treated with mesalamine for a short period of time but discontinued it and really has not had a recurrence. She would take her boyfriends Pentasa occasionally, but they broke up 2 years ago and she has not taken any Pentasa.   GI Review of Systems      Denies abdominal pain, acid reflux, belching, bloating, chest pain, dysphagia with liquids, dysphagia with solids, heartburn, loss of appetite, nausea, vomiting, vomiting blood, weight loss, and  weight gain.        Denies anal fissure, black tarry stools, change in bowel habit, constipation, diarrhea, diverticulosis, fecal incontinence, heme positive stool, hemorrhoids, irritable bowel syndrome, jaundice, light color stool, liver problems, rectal bleeding, and  rectal pain.    Current Medications (verified): 1)  Bactrim 400-80 Mg Tabs (Sulfamethoxazole-Trimethoprim) .... Two Times A Day 2)  Loratadine 10 Mg Tabs (Loratadine) .... As Needed Once Daily 3)  Acetaminophen 500 Mg Caps (Acetaminophen) .... Take 2 Capsule Two Times A Day 4)  Flaxseed Oil 1000 Mg Caps  (Flaxseed (Linseed)) .... Take 1 Capsule By Mouth Three Times A Day 5)  Resvenatrol 10mg  .... Two Times A Day 6)  Aleve 220 Mg Tabs (Naproxen Sodium) .... As Needed  Allergies (verified): 1)  ! * Hctz  Past History:  Past Medical History: Reviewed history from 08/20/2010 and no changes required. Arthritis Asthma Infectious Colitis GI Bleed Hypertension Peptic Ulcer Disease  Past Surgical History: Reviewed history from 08/20/2010 and no changes required. Unremarkable  Family History: Reviewed history from 08/26/2010 and no changes required. Family History of Diabetes:Brother, Maternal Aunts Family History of Irritable Bowel Syndrome:Mother Family History of Colitis: Mother Family History of Breast Cancer: Maternal Aunt Family History of Heart Disease: Father  Social History: Reviewed history from 08/26/2010 and no changes required. Occupation:Social Worker Patient has never smoked.  Alcohol Use - no Daily Caffeine Use Illicit Drug Use - no  Review of Systems       The patient complains of arthritis/joint pain.  The patient denies allergy/sinus, anemia, anxiety-new, back pain, blood in urine, breast changes/lumps, change in vision, confusion, cough, coughing up blood, depression-new, fainting, fatigue, fever, headaches-new, hearing problems, heart murmur, heart rhythm changes, itching, menstrual pain, muscle pains/cramps, night sweats, nosebleeds, pregnancy symptoms, shortness of breath, skin rash, sleeping problems, sore throat, swelling of feet/legs, swollen lymph glands, thirst - excessive , urination - excessive , urination changes/pain, urine leakage, vision changes, and voice change.         Pertinent positive and negative review of systems were noted in the above HPI. All other ROS was otherwise negative.   Vital Signs:  Patient profile:   60 year old female Height:      61 inches Weight:      215.25 pounds BMI:     40.82 Pulse rate:   72 / minute Pulse rhythm:    regular BP sitting:   138 / 90  (left arm) Cuff size:   regular  Vitals Entered By: June McMurray CMA Duncan Dull) (September 01, 2010 2:59 PM)  Physical Exam  General:  Well developed, well nourished, no acute distress.   Impression & Recommendations:  Problem # 1:  HEMORRHOIDS-INTERNAL (ICD-455.0) Patient is status post low-volume rectal bleeding completely resolved on three days of Anusol-HC suppositories. We will give her refills to be used p.r.n.  Problem # 2:  Hx of COLITIS, ACUTE (ICD-558.9) Patient had acute colitis in 2005 rasing the question of possible inflammatory bowel disease. The patient remains asymptomatic. Her last colonoscopy was in 2005. She would like to wait on a repeat colonoscopy at this time. If her bleeding recurs, I would suggest a colonoscopy and repeat biopsies.  Patient Instructions: 1)  Anusol-HC suppositories one q.h.s. 2)  If the bleeding reccurs, consider a repeat colonoscopy. 3)  Otherwise, a repeat colonoscopy will be due in August 2015. 4)  Copy sent to : Dr D.Massey 5)  The medication list was reviewed and reconciled.  All changed / newly prescribed medications were explained.  A complete medication list was provided to the patient / caregiver. Prescriptions: ANUSOL-HC 25 MG SUPP (HYDROCORTISONE ACETATE) Insert 1 suppository into rectum at bedtime  #12 x 0   Entered by:   Lamona Curl CMA (AAMA)   Authorized by:   Hart Carwin MD   Signed by:   Lamona Curl CMA (AAMA) on 09/01/2010   Method used:   Electronically to        [facility]  [address] [id number] (retail)       [address]       [place], [place]  [address]       Ph: [phone] or [phone]       Fax: [phone]   RxID:   [id number]

## 2011-02-06 NOTE — Discharge Summary (Signed)
NAMEJERUSALEM, BROWNSTEIN                             ACCOUNT NO.:  [id number]   MEDICAL RECORD NO.:  [id number]                   PATIENT TYPE:  INP   LOCATION:  0340                                 FACILITY:  [facility]   PHYSICIAN:  Corinna L. Lendell Caprice, MD             DATE OF BIRTH:  Feb 17, 1951   DATE OF ADMISSION:  04/16/2004  DATE OF DISCHARGE:  04/22/2004                                 DISCHARGE SUMMARY   DIAGNOSES:  1. Colitis, rule out Crohn's.  2. Osteoarthritis.  3. History of asthma.  4. History of hypertension.  5. Ocular hypertension.   DISCHARGE MEDICATIONS:  1. Phenergan 25 mg p.o. q.6h. p.r.n. nausea or vomiting.  2. Asacol 400 mg three tablets t.i.d.  3. Levsin 0.125 mg sublingually q.a.c. p.r.n. cramping.  4. She may continue her other outpatient medications which include aspirin a     day.  5. Flexeril as needed.   FOLLOWUP:  She is to follow up with Dr. Juanda Chance in three weeks.  She is to  follow up with Dr. Dagoberto Ligas for eye examination.   ACTIVITY:  She may return to work next week.   DIET:  Low residue.  She may take Tylenol as needed for pain or fever.   CONSULTS:  Dr. Lina Sar   PROCEDURE:  Colonoscopy with biopsy showing nonspecific colitis, aphthous  ulcer, and hemorrhoids.   SPECIAL STUDIES AND RADIOLOGY:  Ova/parasites of the stool negative.  C.  difficile negative.  Stool culture negative.  UA negative for nitrites or  leukocyte esterase.  Specific gravity was greater than 1.040 and trace  ketones.  Complete metabolic panel was significant for a potassium of 3.0,  otherwise fairly unremarkable.  Lipase was 21.  White count on admission was  15,000 with 94% neutrophils.  At discharge her white count had normalized  and her hemoglobin was 11.5, hematocrit 34.7.  Acute abdominal series showed  nothing acute.  CT of the abdomen and pelvis showed thickening of the right  colon bowel wall possibly extending into the transverse colon.   HISTORY AND  HOSPITAL COURSE:  Christina [name] is a very pleasant 60 year old white  female who had been vomiting, having abdominal pain and bloody diarrhea and  presented to the emergency room.  She was seen in her primary care  physician's office and treated empirically for diverticulitis with Cipro but  at that time she had no bleeding per rectum.  She has reported bloody  mucousy stools in the past, but apparently has never had a colonoscopy.  She  had an EGD by Dr. Victorino Dike about 10 years ago.  Her mother had ulcerative  colitis in her 42s.  On examination the patient had a heart rate of 119,  otherwise her vitals were normal.  She appeared in no acute distress.  She  had soft, tender abdomen on the right.  No rebound tenderness.  She  had a  CAT scan which was suggestive of a colitis.  The patient had serial H&Hs  which remained stable.  Her bloody diarrhea resolved and she was started on  empiric IV ciprofloxacin and Flagyl.  She was initially nothing by mouth but  her diet was able to be advanced.  She had vomiting for quite some time  despite resolution of the diarrhea.  She also continued to have right-sided  abdominal pain and some headaches.  I was concerned that the antibiotics may  be causing the vomiting and they were held.  Her vomiting did improve once  these were stopped.  [facility] was consulted and Dr. Juanda Chance performed the  above colonoscopy.  Biopsies of the colon are at this time pending.  She is  started empirically on Asacol pending these results and is to follow up with  her as an outpatient.                                               Corinna L. Lendell Caprice, MD    CLS/MEDQ  D:  04/22/2004  T:  04/22/2004  Job:  [id number]   cc:   Lina Sar, M.D. [facility], M.D.   Oley Balm Georgina Pillion, M.D.  [address]  [place]  [place] [address]  Fax: [phone]

## 2011-02-06 NOTE — H&P (Signed)
Christina [name], Christina [name]                             ACCOUNT NO.:  [id number]   MEDICAL RECORD NO.:  [id number]                   PATIENT TYPE:  EMS   LOCATION:  ED                                   FACILITY:  [facility]   PHYSICIAN:  Melissa L. Ladona Ridgel, MD               DATE OF BIRTH:  [birth year]   DATE OF ADMISSION:  04/16/2004  DATE OF DISCHARGE:                                HISTORY & PHYSICAL   CHIEF COMPLAINT:  Vomiting since Monday and abdominal pain.   PRIMARY CARE PHYSICIAN:  Primary care physician had been Dr. Georgina Pillion, however  she had not seen him due to insurance issues.   HISTORY OF PRESENT ILLNESS:  The patient is a 60 year old white female who  states that she started on Monday around midday with nausea and vomiting.  This was associated with chills and diarrhea. The patient states that she  went to the urgent care center and was given Phenergan and antibiotics,  however she was unable to keep down because of the vomiting. She also noted  today that she had bright red blood in her stool and therefore decided to  come into the emergency room.  She states that no one at home is ill, that  she has been eating at home, although did eat at the [facility] but this  was after the symptoms had started. She does have contact with her two dogs  who are not ill at this time.   REVIEW OF SYSTEMS:  Positive fever, chills, nausea, vomiting, diarrhea, and  abdominal pain. No dysuria, frequency, or hesitancy. Postoperative headache,  dizziness, bright red blood per rectum. No hematuria or hematemesis.   SOCIAL HISTORY:  She denies tobacco use or ethanol use. She works as a  Child psychotherapist.   FAMILY HISTORY:  Mother had ulcerative colitis diagnosed in her 53s and is  deceased secondary to pneumonia. Her father is deceased secondary to CHF and  that was approximately 11 years ago.   PAST SURGICAL HISTORY:  None.   PAST MEDICAL HISTORY:  1. 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  2. Ocular  hypertension.  3. Asthma for which she doe not require treatment.  4. Hypertension for which he is not currently taking any medication.   ALLERGIES:  She states HYDROCHLOROTHIAZIDE caused her blood pressure to go  up and down. She therefore discontinued taking it.   MEDICATIONS:  1. Flexeril.  2. Aspirin 325 mg, coated.  3. Tylenol p.r.n.   PHYSICAL EXAMINATION:  VITAL SIGNS: Temperature 96.7, blood pressure 144/99,  pulse 119, respiratory rate 20, and saturation 97%.  GENERAL: She is comfortable, mildly obese white female in no acute distress.  HEENT: Normocephalic and atraumatic. Pupils equal, round, and reactive to  light. Extraocular movements are intact.  Mucous membranes are slightly dry.  NECK: Supple. There is no JVD, no lymph nodes, no carotid bruits.  CHEST: Clear to  auscultation. No rales, rhonchi, or wheezes.  CARDIOVASCULAR: Regular rate and rhythm. Positive S1 and S2. No S3 or S4.  No murmurs, rubs, or gallops are heard.  ABDOMEN: Soft, nondistended, nontender without guarding or rebound. She does  have diffuse tenderness on palpation, but it is very minimal. She has no  hepatosplenomegaly.  EXTREMITIES: No clubbing, cyanosis, or edema. She has 2+ pulses. Deep tendon  reflexes are 2. Power is 5/5. Cranial nerves II-XII are intact. She appears  nonfocal.   LABORATORY DATA:  White count 14.9  with a 94% neutrophil count, hemoglobin  14.5, hematocrit 43.3, platelet count 315,000. Sodium 133, potassium 3.0,  chloride 99, CO2 24, BUN 9, creatinine 1.1, glucose 130. Her urinalysis is  negative. Her acute abdominal series shows no free air and an appropriate  bowel gas pattern. CT of the abdomen shows bowel thickening at the cecum to  the right colon, sparing the terminal ileum. There are enlarged lymph nodes  related to the medial aspect of the right colon.   ASSESSMENT/PLAN:  This is a 60 year old white female with nausea, vomiting,  diarrhea, and acute abdominal pain  with some bright red blood. She was  treated for diverticular disease times one day as an outpatient, but was  unable to keep the pills down. A CT shows possible distribution of bowel  inflammation consistent with ulcerative colitis or Crohn's. Will  admit the  patient to a general medical bed.  1. GI--will rule out ulcerative colitis or Crohn's colitis. For tonight we     are going to hydrate her, continue her Cipro, and add Flagyl. Will check     stool cultures in the morning. Will consult with GI and have them do a     possible colonoscopy. She will be NPO until then.  2. GU--she has no infection at this time. Will follow I&O's rigidly.  3. Endocrine--she has increased blood sugars, but will check a hemoglobin     A1C and leave sliding scale insulin should she need that.  4. Cardiovascular--she has no current issues or complaints, although she     does give a history of hypertension. We will monitor her here.  5. Pulmonary--she is stable. We will encourage incentive spirometry.  6. Pain control and nausea treatment will be undertaken.  7. We will replete her potassium.                                               Melissa L. Ladona Ridgel, MD    MLT/MEDQ  D:  04/16/2004  T:  04/16/2004  Job:  [id number]   cc:   Oley Balm. Georgina Pillion, M.D.  [address]  [place]  [place] [address]  Fax: [phone]

## 2014-04-23 ENCOUNTER — Encounter (HOSPITAL_COMMUNITY): Payer: Self-pay | Admitting: Pharmacy Technician

## 2014-04-25 NOTE — H&P (Signed)
TOTAL KNEE ADMISSION H&P  Patient is being admitted for left total knee arthroplasty.  Subjective:  Chief Complaint:    Left knee OA / pain.  HPI: Christina [name], 63 y.o. female, has a history of pain and functional disability in the left knee due to arthritis and has failed non-surgical conservative treatments for greater than 12 weeks to include NSAID's and/or analgesics, corticosteriod injections, use of assistive devices and activity modification.  Onset of symptoms was gradual, starting >10 years ago with gradually worsening course since that time. The patient noted no past surgery on the left knee(s).  Patient currently rates pain in the left knee(s) at 10 out of 10 with activity. Patient has worsening of pain with activity and weight bearing, pain that interferes with activities of daily living, pain with passive range of motion, crepitus and joint swelling.  Patient has evidence of periarticular osteophytes and joint space narrowing by imaging studies.  There is no active infection.  Risks, benefits and expectations were discussed with the patient.  Risks including but not limited to the risk of anesthesia, blood clots, nerve damage, blood vessel damage, failure of the prosthesis, infection and up to and including death.  Patient understand the risks, benefits and expectations and wishes to proceed with surgery.   PCP: Mateo Flow, MD  D/C Plans:      Home with HHPT  Post-op Meds:       No Rx given   Tranexamic Acid:      To be given - IV    Decadron:      Is to be given  FYI:     ASA post-op  Norco post-op  ? CPM    Past Medical History  Diagnosis Date  . Hypertension     hx of 10 years ago   . Asthma     hx of   . GERD (gastroesophageal reflux disease)   . H/O hiatal hernia   . Arthritis   . Colitis     mild       No past surgical history on file.    Allergies  Allergen Reactions  . Hctz [Hydrochlorothiazide] Other (See Comments)    Blood pressure way up then way  down      History  Substance Use Topics  . Smoking status: Never Smoker   . Smokeless tobacco: Never Used  . Alcohol Use: No      Review of Systems  Constitutional: Negative.   Eyes: Negative.   Respiratory: Negative.   Cardiovascular: Negative.   Gastrointestinal: Positive for diarrhea.  Genitourinary: Negative.   Musculoskeletal: Positive for joint pain.  Skin: Negative.   Neurological: Positive for headaches.  Endo/Heme/Allergies: Negative.   Psychiatric/Behavioral: Negative.     Objective:  Physical Exam  Constitutional: She is oriented to person, place, and time. She appears well-developed and well-nourished.  HENT:  Head: Normocephalic and atraumatic.  Mouth/Throat: Oropharynx is clear and moist.  Eyes: Pupils are equal, round, and reactive to light.  Neck: Neck supple. No JVD present. No tracheal deviation present. No thyromegaly present.  Cardiovascular: Normal rate, regular rhythm, normal heart sounds and intact distal pulses.   Respiratory: Effort normal and breath sounds normal. No stridor. No respiratory distress. She has no wheezes.  GI: Soft. There is no tenderness. There is no guarding.  Musculoskeletal:       Left knee: She exhibits decreased range of motion, swelling and bony tenderness. She exhibits no deformity, no laceration, no erythema and normal alignment.  Tenderness found.  Lymphadenopathy:    She has no cervical adenopathy.  Neurological: She is alert and oriented to person, place, and time.  Skin: Skin is warm and dry.  Psychiatric: She has a normal mood and affect.     Labs:  Estimated body mass index is 40.69 kg/(m^2) as calculated from the following:   Height as of 09/01/10: 5\' 1"  (1.549 m).   Weight as of 09/01/10: 97.637 kg (215 lb 4 oz).   Imaging Review Plain radiographs demonstrate severe degenerative joint disease of the left knee(s). The overall alignment is neutral. The bone quality appears to be good for age and reported  activity level.  Assessment/Plan:  End stage arthritis, left knee   The patient history, physical examination, clinical judgment of the provider and imaging studies are consistent with end stage degenerative joint disease of the left knee(s) and total knee arthroplasty is deemed medically necessary. The treatment options including medical management, injection therapy arthroscopy and arthroplasty were discussed at length. The risks and benefits of total knee arthroplasty were presented and reviewed. The risks due to aseptic loosening, infection, stiffness, patella tracking problems, thromboembolic complications and other imponderables were discussed. The patient acknowledged the explanation, agreed to proceed with the plan and consent was signed. Patient is being admitted for inpatient treatment for surgery, pain control, PT, OT, prophylactic antibiotics, VTE prophylaxis, progressive ambulation and ADL's and discharge planning. The patient is planning to be discharged home with home health services.     [place] Adeoluwa Silvers   PA-C  04/25/2014, 12:59 PM

## 2014-04-26 NOTE — Progress Notes (Signed)
Need orders in EPIC.  Surgery on 05/07/14 for left knee replacement.  Need order for correct consent form in EPIC. Consent order reads for right consent form.  Thank You.  Preop on 04/30/14 at 0800am.

## 2014-04-27 NOTE — Patient Instructions (Signed)
DESERI LOSS  04/27/2014   Your procedure is scheduled on: 05/07/14     Report to [facility].  Follow the Signs to [place] at 0930       am  Call this number if you have problems the morning of surgery: [phone]   Remember:   Do not eat food or drink liquids after midnight.   Take these medicines the morning of surgery with A SIP OF WATER:    Do not wear jewelry, make-up or nail polish.  Do not wear lotions, powders, or perfumes. , deodorant.    Do not shave 48 hours prior to surgery.   Do not bring valuables to the hospital.  Contacts, dentures or bridgework may not be worn into surgery.  Leave suitcase in the car. After surgery it may be brought to your room.  For patients admitted to the hospital, checkout time is 11:00 AM the day of  discharge.       [place] - Preparing for Surgery Before surgery, you can play an important role.  Because skin is not sterile, your skin needs to be as free of germs as possible.  You can reduce the number of germs on your skin by washing with CHG (chlorahexidine gluconate) soap before surgery.  CHG is an antiseptic cleaner which kills germs and bonds with the skin to continue killing germs even after washing. Please DO NOT use if you have an allergy to CHG or antibacterial soaps.  If your skin becomes reddened/irritated stop using the CHG and inform your nurse when you arrive at Short Stay. Do not shave (including legs and underarms) for at least 48 hours prior to the first CHG shower.  You may shave your face/neck. Please follow these instructions carefully:  1.  Shower with CHG Soap the night before surgery and the  morning of Surgery.  2.  If you choose to wash your hair, wash your hair first as usual with your  normal  shampoo.  3.  After you shampoo, rinse your hair and body thoroughly to remove the  shampoo.                           4.  Use CHG as you would any other liquid soap.  You can apply chg directly  to the skin  and wash                       Gently with a scrungie or clean washcloth.  5.  Apply the CHG Soap to your body ONLY FROM THE NECK DOWN.   Do not use on face/ open                           Wound or open sores. Avoid contact with eyes, ears mouth and genitals (private parts).                       Wash face,  Genitals (private parts) with your normal soap.             6.  Wash thoroughly, paying special attention to the area where your surgery  will be performed.  7.  Thoroughly rinse your body with warm water from the neck down.  8.  DO NOT shower/wash with your normal soap after using and rinsing off  the CHG Soap.  9.  Pat yourself dry with a clean towel.            10.  Wear clean pajamas.            11.  Place clean sheets on your bed the night of your first shower and do not  sleep with pets. Day of Surgery : Do not apply any lotions/deodorants the morning of surgery.  Please wear clean clothes to the hospital/surgery center.  FAILURE TO FOLLOW THESE INSTRUCTIONS MAY RESULT IN THE CANCELLATION OF YOUR SURGERY PATIENT SIGNATURE_________________________________  NURSE SIGNATURE__________________________________  ________________________________________________________________________  WHAT IS A BLOOD TRANSFUSION? Blood Transfusion Information  A transfusion is the replacement of blood or some of its parts. Blood is made up of multiple cells which provide different functions.  Red blood cells carry oxygen and are used for blood loss replacement.  White blood cells fight against infection.  Platelets control bleeding.  Plasma helps clot blood.  Other blood products are available for specialized needs, such as hemophilia or other clotting disorders. BEFORE THE TRANSFUSION  Who gives blood for transfusions?   Healthy volunteers who are fully evaluated to make sure their blood is safe. This is blood bank blood. Transfusion therapy is the safest it has ever been in  the practice of medicine. Before blood is taken from a donor, a complete history is taken to make sure that person has no history of diseases nor engages in risky social behavior (examples are intravenous drug use or sexual activity with multiple partners). The donor's travel history is screened to minimize risk of transmitting infections, such as malaria. The donated blood is tested for signs of infectious diseases, such as HIV and hepatitis. The blood is then tested to be sure it is compatible with you in order to minimize the chance of a transfusion reaction. If you or a relative donates blood, this is often done in anticipation of surgery and is not appropriate for emergency situations. It takes many days to process the donated blood. RISKS AND COMPLICATIONS Although transfusion therapy is very safe and saves many lives, the main dangers of transfusion include:   Getting an infectious disease.  Developing a transfusion reaction. This is an allergic reaction to something in the blood you were given. Every precaution is taken to prevent this. The decision to have a blood transfusion has been considered carefully by your caregiver before blood is given. Blood is not given unless the benefits outweigh the risks. AFTER THE TRANSFUSION  Right after receiving a blood transfusion, you will usually feel much better and more energetic. This is especially true if your red blood cells have gotten low (anemic). The transfusion raises the level of the red blood cells which carry oxygen, and this usually causes an energy increase.  The nurse administering the transfusion will monitor you carefully for complications. HOME CARE INSTRUCTIONS  No special instructions are needed after a transfusion. You may find your energy is better. Speak with your caregiver about any limitations on activity for underlying diseases you may have. SEEK MEDICAL CARE IF:   Your condition is not improving after your  transfusion.  You develop redness or irritation at the intravenous (IV) site. SEEK IMMEDIATE MEDICAL CARE IF:  Any of the following symptoms occur over the next 12 hours:  Shaking chills.  You have a temperature by mouth above 102 F (38.9 C), not controlled by medicine.  Chest, back, or muscle pain.  People around you feel you are not acting correctly or  are confused.  Shortness of breath or difficulty breathing.  Dizziness and fainting.  You get a rash or develop hives.  You have a decrease in urine output.  Your urine turns a dark color or changes to pink, red, or brown. Any of the following symptoms occur over the next 10 days:  You have a temperature by mouth above 102 F (38.9 C), not controlled by medicine.  Shortness of breath.  Weakness after normal activity.  The white part of the eye turns yellow (jaundice).  You have a decrease in the amount of urine or are urinating less often.  Your urine turns a dark color or changes to pink, red, or brown. Document Released: 09/04/2000 Document Revised: 11/30/2011 Document Reviewed: 04/23/2008 ExitCare Patient Information 2014 [place].  _______________________________________________________________________  Incentive Spirometer  An incentive spirometer is a tool that can help keep your lungs clear and active. This tool measures how well you are filling your lungs with each breath. Taking long deep breaths may help reverse or decrease the chance of developing breathing (pulmonary) problems (especially infection) following:  A long period of time when you are unable to move or be active. BEFORE THE PROCEDURE   If the spirometer includes an indicator to show your best effort, your nurse or respiratory therapist will set it to a desired goal.  If possible, sit up straight or lean slightly forward. Try not to slouch.  Hold the incentive spirometer in an upright position. INSTRUCTIONS FOR USE  1. Sit on the  edge of your bed if possible, or sit up as far as you can in bed or on a chair. 2. Hold the incentive spirometer in an upright position. 3. Breathe out normally. 4. Place the mouthpiece in your mouth and seal your lips tightly around it. 5. Breathe in slowly and as deeply as possible, raising the piston or the ball toward the top of the column. 6. Hold your breath for 3-5 seconds or for as long as possible. Allow the piston or ball to fall to the bottom of the column. 7. Remove the mouthpiece from your mouth and breathe out normally. 8. Rest for a few seconds and repeat Steps 1 through 7 at least 10 times every 1-2 hours when you are awake. Take your time and take a few normal breaths between deep breaths. 9. The spirometer may include an indicator to show your best effort. Use the indicator as a goal to work toward during each repetition. 10. After each set of 10 deep breaths, practice coughing to be sure your lungs are clear. If you have an incision (the cut made at the time of surgery), support your incision when coughing by placing a pillow or rolled up towels firmly against it. Once you are able to get out of bed, walk around indoors and cough well. You may stop using the incentive spirometer when instructed by your caregiver.  RISKS AND COMPLICATIONS  Take your time so you do not get dizzy or light-headed.  If you are in pain, you may need to take or ask for pain medication before doing incentive spirometry. It is harder to take a deep breath if you are having pain. AFTER USE  Rest and breathe slowly and easily.  It can be helpful to keep track of a log of your progress. Your caregiver can provide you with a simple table to help with this. If you are using the spirometer at home, follow these instructions: [place] IF:  You are having difficultly using the spirometer.  You have trouble using the spirometer as often as instructed.  Your pain medication is not giving enough  relief while using the spirometer.  You develop fever of 100.5 F (38.1 C) or higher. SEEK IMMEDIATE MEDICAL CARE IF:   You cough up bloody sputum that had not been present before.  You develop fever of 102 F (38.9 C) or greater.  You develop worsening pain at or near the incision site. MAKE SURE YOU:   Understand these instructions.  Will watch your condition.  Will get help right away if you are not doing well or get worse. Document Released: 01/18/2007 Document Revised: 11/30/2011 Document Reviewed: 03/21/2007 ExitCare Patient Information 2014 ExitCare, [place].   ________________________________________________________________________    Please read over the following fact sheets that you were given: MRSA Information, coughing and deep breathing exercises, leg exercises

## 2014-04-30 ENCOUNTER — Encounter (INDEPENDENT_AMBULATORY_CARE_PROVIDER_SITE_OTHER): Payer: Self-pay

## 2014-04-30 ENCOUNTER — Encounter (HOSPITAL_COMMUNITY)
Admission: RE | Admit: 2014-04-30 | Discharge: 2014-04-30 | Disposition: A | Discharge status: Home / Self Care | Payer: BC Managed Care – PPO | Source: Ambulatory Visit | Attending: Orthopedic Surgery | Admitting: Orthopedic Surgery

## 2014-04-30 ENCOUNTER — Ambulatory Visit (HOSPITAL_COMMUNITY)
Admission: RE | Admit: 2014-04-30 | Discharge: 2014-04-30 | Disposition: A | Discharge status: Home / Self Care | Payer: BC Managed Care – PPO | Source: Ambulatory Visit | Attending: Orthopedic Surgery | Admitting: Orthopedic Surgery

## 2014-04-30 ENCOUNTER — Encounter (HOSPITAL_COMMUNITY): Payer: Self-pay

## 2014-04-30 DIAGNOSIS — I10 Essential (primary) hypertension: Secondary | ICD-10-CM | POA: Insufficient documentation

## 2014-04-30 DIAGNOSIS — Z01818 Encounter for other preprocedural examination: Secondary | ICD-10-CM | POA: Insufficient documentation

## 2014-04-30 DIAGNOSIS — Z01812 Encounter for preprocedural laboratory examination: Secondary | ICD-10-CM | POA: Insufficient documentation

## 2014-04-30 DIAGNOSIS — Z01810 Encounter for preprocedural cardiovascular examination: Secondary | ICD-10-CM | POA: Insufficient documentation

## 2014-04-30 HISTORY — DX: Essential (primary) hypertension: I10

## 2014-04-30 HISTORY — DX: Gastro-esophageal reflux disease without esophagitis: K21.9

## 2014-04-30 HISTORY — DX: Unspecified osteoarthritis, unspecified site: M19.90

## 2014-04-30 HISTORY — DX: Personal history of other diseases of the digestive system: Z87.19

## 2014-04-30 HISTORY — DX: Noninfective gastroenteritis and colitis, unspecified: K52.9

## 2014-04-30 HISTORY — DX: Unspecified asthma, uncomplicated: J45.909

## 2014-04-30 LAB — URINALYSIS, ROUTINE W REFLEX MICROSCOPIC
BILIRUBIN URINE: NEGATIVE
Glucose, UA: NEGATIVE mg/dL
Hgb urine dipstick: NEGATIVE
Ketones, ur: NEGATIVE mg/dL
Leukocytes, UA: NEGATIVE
Nitrite: NEGATIVE
PROTEIN: NEGATIVE mg/dL
SPECIFIC GRAVITY, URINE: 1.024 (ref 1.005–1.030)
UROBILINOGEN UA: 0.2 mg/dL (ref 0.0–1.0)
pH: 6 (ref 5.0–8.0)

## 2014-04-30 LAB — BASIC METABOLIC PANEL
Anion gap: 16 — ABNORMAL HIGH (ref 5–15)
BUN: 14 mg/dL (ref 6–23)
CHLORIDE: 99 meq/L (ref 96–112)
CO2: 24 mEq/L (ref 19–32)
Calcium: 9.6 mg/dL (ref 8.4–10.5)
Creatinine, Ser: 1.03 mg/dL (ref 0.50–1.10)
GFR calc non Af Amer: 58 mL/min — ABNORMAL LOW (ref >90)
GFR, EST AFRICAN AMERICAN: 67 mL/min — AB (ref >90)
GLUCOSE: 89 mg/dL (ref 70–99)
POTASSIUM: 4.6 meq/L (ref 3.7–5.3)
Sodium: 139 mEq/L (ref 137–147)

## 2014-04-30 LAB — APTT: aPTT: 27 seconds (ref 24–37)

## 2014-04-30 LAB — CBC
HCT: 37.2 % (ref 36.0–46.0)
HEMOGLOBIN: 12.6 g/dL (ref 12.0–15.0)
MCH: 29.8 pg (ref 26.0–34.0)
MCHC: 33.9 g/dL (ref 30.0–36.0)
MCV: 87.9 fL (ref 78.0–100.0)
Platelets: 319 10*3/uL (ref 150–400)
RBC: 4.23 MIL/uL (ref 3.87–5.11)
RDW: 13.6 % (ref 11.5–15.5)
WBC: 4.1 10*3/uL (ref 4.0–10.5)

## 2014-04-30 LAB — ABO/RH: ABO/RH(D): B POS

## 2014-04-30 LAB — PROTIME-INR
INR: 1.01 (ref 0.00–1.49)
PROTHROMBIN TIME: 13.3 s (ref 11.6–15.2)

## 2014-04-30 LAB — SURGICAL PCR SCREEN
MRSA, PCR: NEGATIVE
Staphylococcus aureus: NEGATIVE

## 2014-04-30 IMAGING — CR DG CHEST 2V
2 series · 2 of 2 positions shown · non-contrast
Comparison: None.

CLINICAL DATA: Preoperative evaluation; hypertension

EXAM:
CHEST  2 VIEW

[w chest pa]
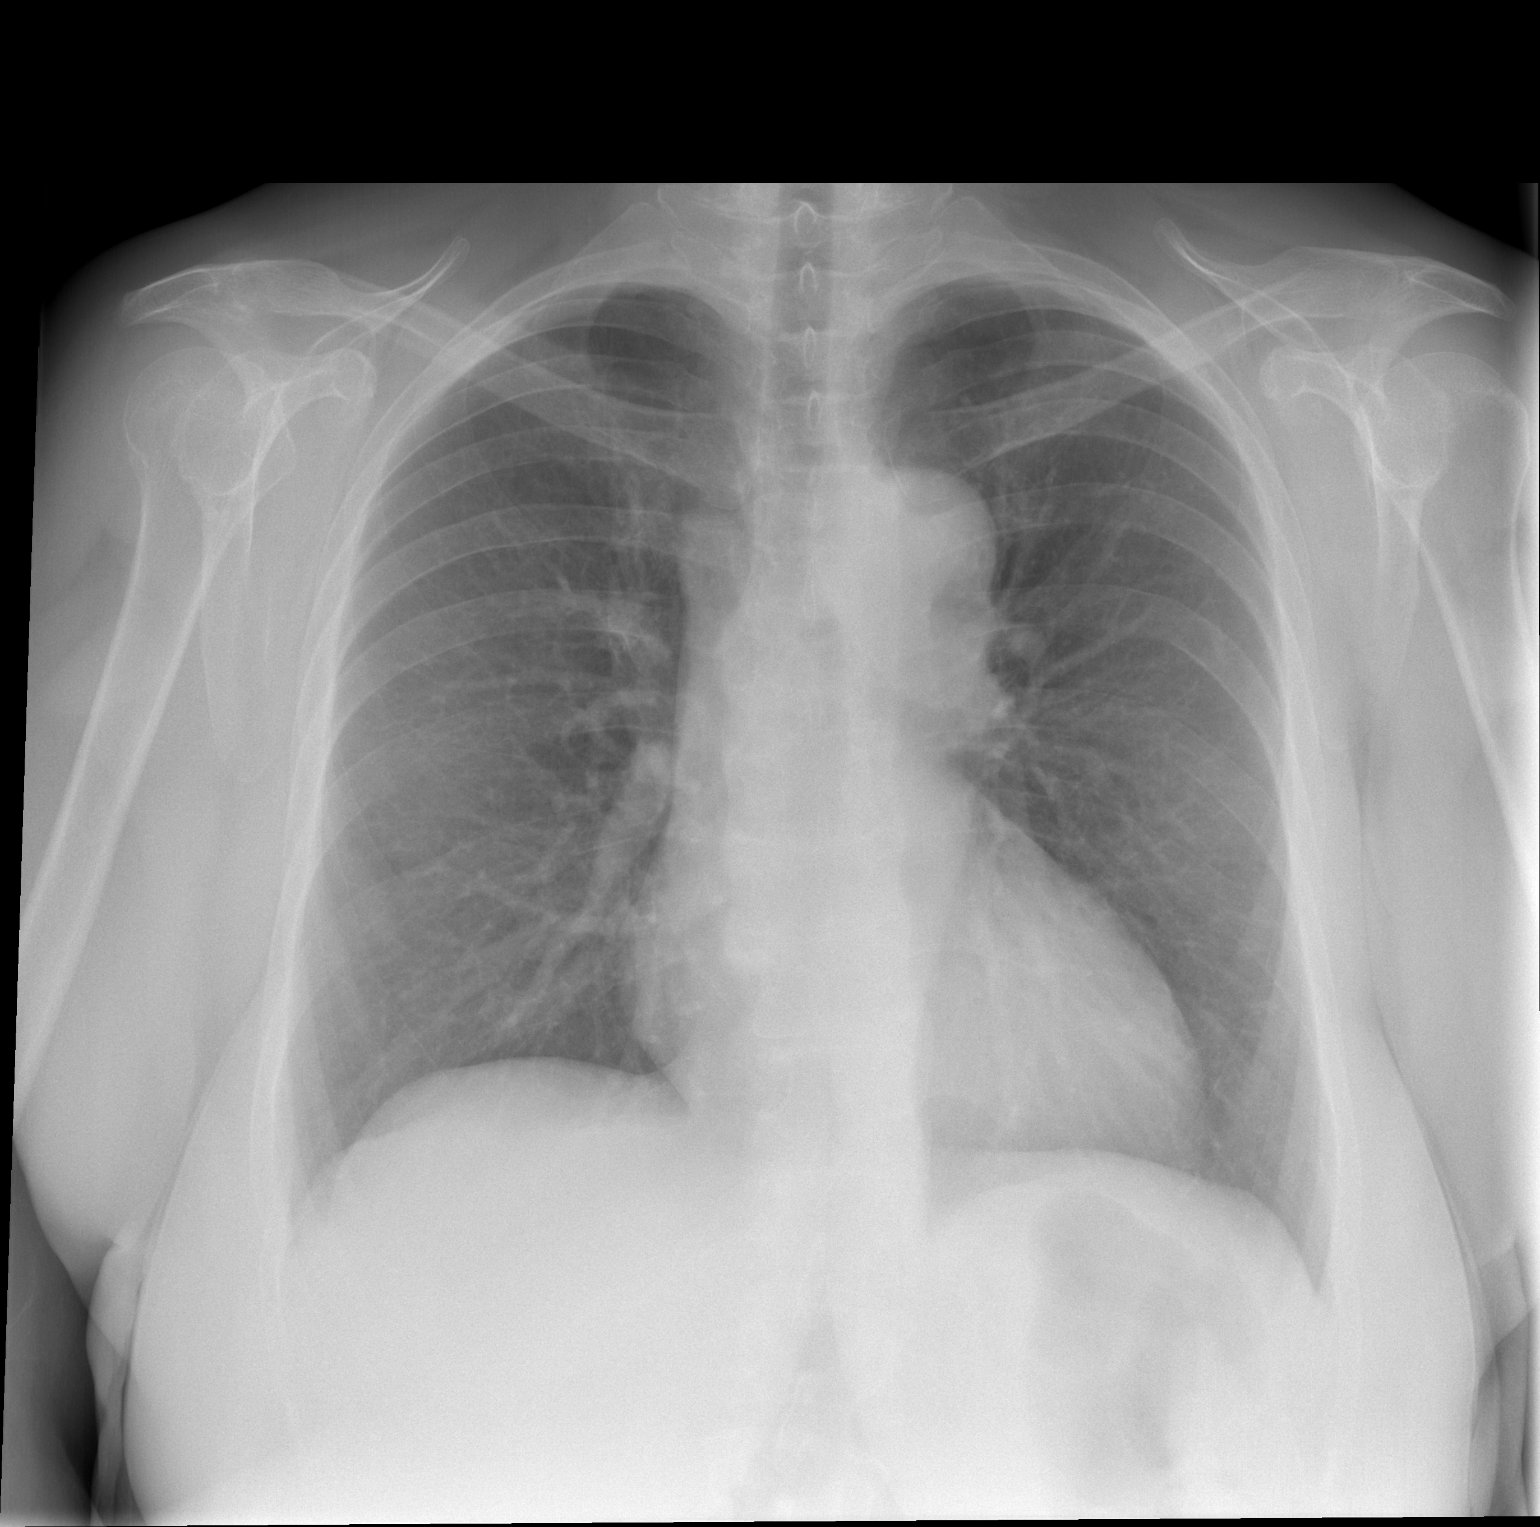

[w chest lat]
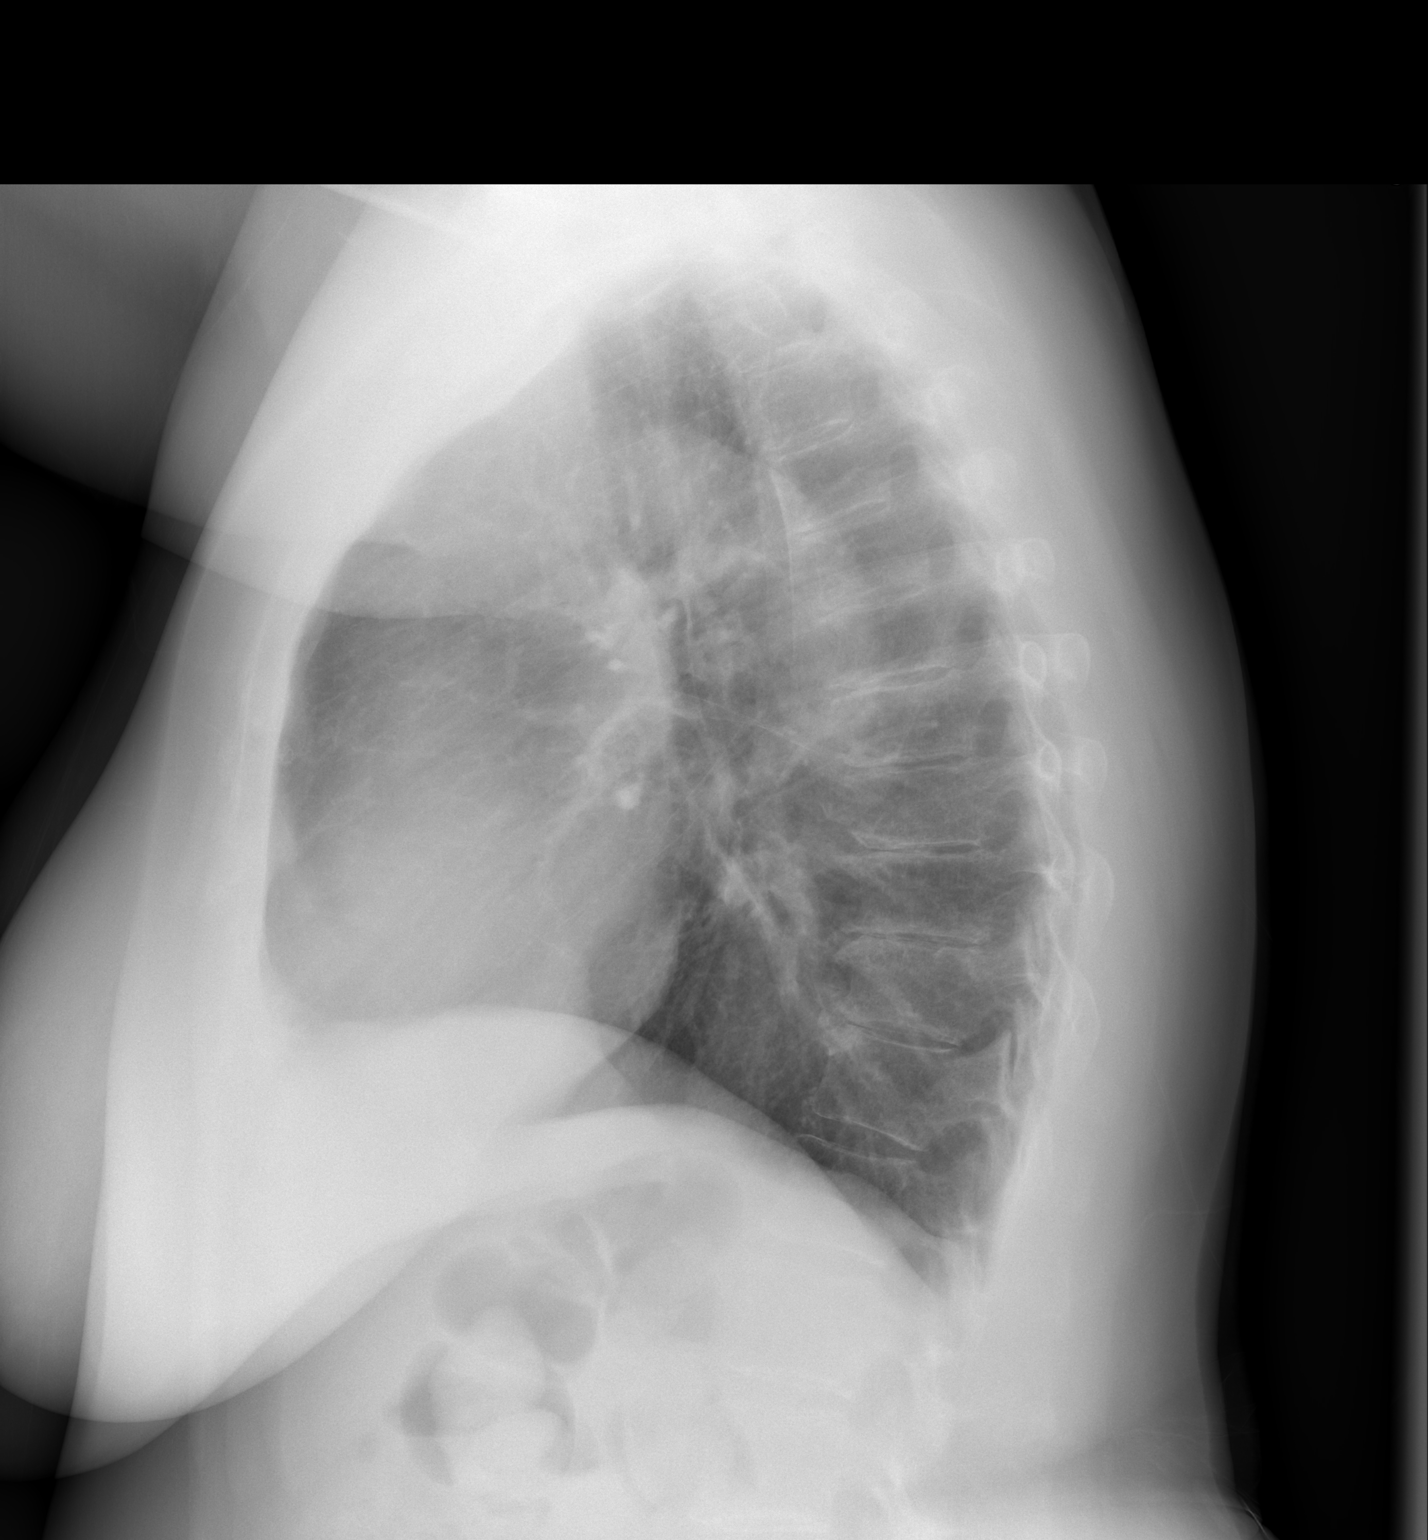

[2 of 2 positions shown; findings below may reference images not displayed]

FINDINGS: No edema or consolidation. Heart size and pulmonary vascularity are
normal. No adenopathy. There is degenerative change in the thoracic
spine.
IMPRESSION: No edema or consolidation.

## 2014-04-30 NOTE — Progress Notes (Signed)
Requested last office visit note from Dr Humphrey Rolls .  Also faxed to Dr Humphrey Rolls blood pressure readings from preop visit.  Have instructed patient to also call with blood pressure readings.

## 2014-04-30 NOTE — Progress Notes (Signed)
Front teeth are crowned.

## 2014-04-30 NOTE — Progress Notes (Signed)
Preop office visit from Dr Humphrey Rolls 04/04/14 on chart.

## 2014-05-07 ENCOUNTER — Encounter (HOSPITAL_COMMUNITY): Payer: BC Managed Care – PPO | Admitting: Certified Registered"

## 2014-05-07 ENCOUNTER — Inpatient Hospital Stay (HOSPITAL_COMMUNITY)
Admission: RE | Admit: 2014-05-07 | Discharge: 2014-05-09 | DRG: 470 | Disposition: A | Discharge status: Home Health | Payer: BC Managed Care – PPO | Source: Ambulatory Visit | Attending: Orthopedic Surgery | Admitting: Orthopedic Surgery

## 2014-05-07 ENCOUNTER — Encounter (HOSPITAL_COMMUNITY)
Admission: RE | Disposition: A | Discharge status: Home Health | Payer: Self-pay | Source: Ambulatory Visit | Attending: Orthopedic Surgery

## 2014-05-07 ENCOUNTER — Inpatient Hospital Stay (HOSPITAL_COMMUNITY): Payer: BC Managed Care – PPO | Admitting: Certified Registered"

## 2014-05-07 ENCOUNTER — Encounter (HOSPITAL_COMMUNITY): Payer: Self-pay | Admitting: Certified Registered"

## 2014-05-07 DIAGNOSIS — M810 Age-related osteoporosis without current pathological fracture: Secondary | ICD-10-CM | POA: Diagnosis present

## 2014-05-07 DIAGNOSIS — I10 Essential (primary) hypertension: Secondary | ICD-10-CM | POA: Diagnosis present

## 2014-05-07 DIAGNOSIS — E669 Obesity, unspecified: Secondary | ICD-10-CM | POA: Diagnosis present

## 2014-05-07 DIAGNOSIS — M6580 Other synovitis and tenosynovitis, unspecified site: Secondary | ICD-10-CM | POA: Diagnosis present

## 2014-05-07 DIAGNOSIS — Z96653 Presence of artificial knee joint, bilateral: Secondary | ICD-10-CM

## 2014-05-07 DIAGNOSIS — M25569 Pain in unspecified knee: Secondary | ICD-10-CM | POA: Diagnosis present

## 2014-05-07 DIAGNOSIS — D62 Acute posthemorrhagic anemia: Secondary | ICD-10-CM | POA: Diagnosis not present

## 2014-05-07 DIAGNOSIS — K219 Gastro-esophageal reflux disease without esophagitis: Secondary | ICD-10-CM | POA: Diagnosis present

## 2014-05-07 DIAGNOSIS — M24569 Contracture, unspecified knee: Secondary | ICD-10-CM | POA: Diagnosis present

## 2014-05-07 DIAGNOSIS — M1710 Unilateral primary osteoarthritis, unspecified knee: Secondary | ICD-10-CM | POA: Diagnosis present

## 2014-05-07 DIAGNOSIS — Z96652 Presence of left artificial knee joint: Secondary | ICD-10-CM

## 2014-05-07 HISTORY — PX: TOTAL KNEE ARTHROPLASTY: SHX125

## 2014-05-07 LAB — TYPE AND SCREEN
ABO/RH(D): B POS
ANTIBODY SCREEN: NEGATIVE

## 2014-05-07 SURGERY — ARTHROPLASTY, KNEE, TOTAL
Anesthesia: Spinal | Site: Knee | Laterality: Left

## 2014-05-07 MED ORDER — HYDROCODONE-ACETAMINOPHEN 7.5-325 MG PO TABS
1.0000 | ORAL_TABLET | ORAL | Status: DC
Start: 2014-05-07 — End: 2014-05-09
  Administered 2014-05-07 (×3): 2 via ORAL
  Administered 2014-05-08: 1 via ORAL
  Administered 2014-05-08 (×2): 2 via ORAL
  Administered 2014-05-08: 1 via ORAL
  Administered 2014-05-09 (×3): 2 via ORAL
  Filled 2014-05-07 (×9): qty 2
  Filled 2014-05-07: qty 1

## 2014-05-07 MED ORDER — PROPOFOL 10 MG/ML IV BOLUS
INTRAVENOUS | Status: DC | PRN
  Administered 2014-05-07 (×2): 20 mg via INTRAVENOUS

## 2014-05-07 MED ORDER — SODIUM CHLORIDE 0.9 % IJ SOLN
INTRAMUSCULAR | Status: DC | PRN
  Administered 2014-05-07: 9 mL

## 2014-05-07 MED ORDER — FERROUS SULFATE 325 (65 FE) MG PO TABS
325.0000 mg | ORAL_TABLET | Freq: Three times a day (TID) | ORAL | Status: DC
  Administered 2014-05-07: 325 mg via ORAL
  Filled 2014-05-07 (×8): qty 1

## 2014-05-07 MED ORDER — MEPERIDINE HCL 50 MG/ML IJ SOLN
6.2500 mg | INTRAMUSCULAR | Status: DC | PRN
  Administered 2014-05-07: 12.5 mg via INTRAVENOUS

## 2014-05-07 MED ORDER — MIDAZOLAM HCL 5 MG/5ML IJ SOLN
INTRAMUSCULAR | Status: DC | PRN
  Administered 2014-05-07: 2 mg via INTRAVENOUS

## 2014-05-07 MED ORDER — CEFAZOLIN SODIUM-DEXTROSE 2-3 GM-% IV SOLR
2.0000 g | INTRAVENOUS | Status: AC
  Administered 2014-05-07: 2 g via INTRAVENOUS

## 2014-05-07 MED ORDER — DEXAMETHASONE SODIUM PHOSPHATE 10 MG/ML IJ SOLN
10.0000 mg | Freq: Once | INTRAMUSCULAR | Status: AC
  Administered 2014-05-07: 10 mg via INTRAVENOUS

## 2014-05-07 MED ORDER — LIDOCAINE HCL (CARDIAC) 20 MG/ML IV SOLN
INTRAVENOUS | Status: AC
  Filled 2014-05-07: qty 5

## 2014-05-07 MED ORDER — METOCLOPRAMIDE HCL 5 MG/ML IJ SOLN
5.0000 mg | Freq: Three times a day (TID) | INTRAMUSCULAR | Status: DC | PRN
  Administered 2014-05-07 – 2014-05-08 (×2): 10 mg via INTRAVENOUS
  Filled 2014-05-07 (×2): qty 2

## 2014-05-07 MED ORDER — ONDANSETRON HCL 4 MG/2ML IJ SOLN
4.0000 mg | Freq: Four times a day (QID) | INTRAMUSCULAR | Status: DC | PRN
Start: 2014-05-07 — End: 2014-05-09
  Administered 2014-05-08: 4 mg via INTRAVENOUS
  Filled 2014-05-07: qty 2

## 2014-05-07 MED ORDER — POTASSIUM CHLORIDE 2 MEQ/ML IV SOLN
INTRAVENOUS | Status: DC
  Administered 2014-05-07 (×2): via INTRAVENOUS
  Filled 2014-05-07 (×11): qty 1000

## 2014-05-07 MED ORDER — PROPOFOL 10 MG/ML IV BOLUS
INTRAVENOUS | Status: AC
  Filled 2014-05-07: qty 20

## 2014-05-07 MED ORDER — BUPIVACAINE-EPINEPHRINE (PF) 0.25% -1:200000 IJ SOLN
INTRAMUSCULAR | Status: AC
  Filled 2014-05-07: qty 30

## 2014-05-07 MED ORDER — PROMETHAZINE HCL 25 MG/ML IJ SOLN: 6.2500 mg | INTRAMUSCULAR | Status: DC | PRN

## 2014-05-07 MED ORDER — BUPIVACAINE-EPINEPHRINE (PF) 0.25% -1:200000 IJ SOLN
INTRAMUSCULAR | Status: DC | PRN
  Administered 2014-05-07: 30 mL

## 2014-05-07 MED ORDER — DEXAMETHASONE SODIUM PHOSPHATE 10 MG/ML IJ SOLN
INTRAMUSCULAR | Status: AC
  Filled 2014-05-07: qty 1

## 2014-05-07 MED ORDER — BUPIVACAINE LIPOSOME 1.3 % IJ SUSP
20.0000 mL | Freq: Once | INTRAMUSCULAR | Status: AC
  Administered 2014-05-07: 20 mL
  Filled 2014-05-07: qty 20

## 2014-05-07 MED ORDER — HYDROMORPHONE HCL PF 1 MG/ML IJ SOLN
0.5000 mg | INTRAMUSCULAR | Status: DC | PRN
  Administered 2014-05-07 (×2): 1 mg via INTRAVENOUS
  Administered 2014-05-09: 0.5 mg via INTRAVENOUS
  Filled 2014-05-07 (×3): qty 1

## 2014-05-07 MED ORDER — ONDANSETRON HCL 4 MG/2ML IJ SOLN
INTRAMUSCULAR | Status: DC | PRN
Start: 2014-05-07 — End: 2014-05-07
  Administered 2014-05-07: 4 mg via INTRAVENOUS

## 2014-05-07 MED ORDER — MAGNESIUM CITRATE PO SOLN: 1.0000 | Freq: Once | ORAL | Status: AC | PRN

## 2014-05-07 MED ORDER — HYDROMORPHONE HCL PF 1 MG/ML IJ SOLN: 0.2500 mg | INTRAMUSCULAR | Status: DC | PRN

## 2014-05-07 MED ORDER — ASPIRIN EC 325 MG PO TBEC
325.0000 mg | DELAYED_RELEASE_TABLET | Freq: Two times a day (BID) | ORAL | Status: DC
  Administered 2014-05-08 – 2014-05-09 (×3): 325 mg via ORAL
  Filled 2014-05-07 (×5): qty 1

## 2014-05-07 MED ORDER — LACTATED RINGERS IV SOLN: INTRAVENOUS | Status: DC

## 2014-05-07 MED ORDER — METHOCARBAMOL 1000 MG/10ML IJ SOLN
500.0000 mg | Freq: Four times a day (QID) | INTRAVENOUS | Status: DC | PRN
  Administered 2014-05-07: 500 mg via INTRAVENOUS
  Filled 2014-05-07: qty 5

## 2014-05-07 MED ORDER — ONDANSETRON HCL 4 MG/2ML IJ SOLN
INTRAMUSCULAR | Status: AC
  Filled 2014-05-07: qty 2

## 2014-05-07 MED ORDER — CEFAZOLIN SODIUM-DEXTROSE 2-3 GM-% IV SOLR
INTRAVENOUS | Status: AC
  Filled 2014-05-07: qty 50

## 2014-05-07 MED ORDER — SODIUM CHLORIDE 0.9 % IR SOLN
Status: DC | PRN
  Administered 2014-05-07: 1000 mL

## 2014-05-07 MED ORDER — 0.9 % SODIUM CHLORIDE (POUR BTL) OPTIME
TOPICAL | Status: DC | PRN
  Administered 2014-05-07: 1000 mL

## 2014-05-07 MED ORDER — KETOROLAC TROMETHAMINE 30 MG/ML IJ SOLN
INTRAMUSCULAR | Status: DC | PRN
  Administered 2014-05-07: 30 mg

## 2014-05-07 MED ORDER — TRANEXAMIC ACID 100 MG/ML IV SOLN
1000.0000 mg | Freq: Once | INTRAVENOUS | Status: AC
  Administered 2014-05-07: 1000 mg via INTRAVENOUS
  Filled 2014-05-07: qty 10

## 2014-05-07 MED ORDER — DIPHENHYDRAMINE HCL 25 MG PO TABS
25.0000 mg | ORAL_TABLET | Freq: Four times a day (QID) | ORAL | Status: DC | PRN
  Filled 2014-05-07: qty 1

## 2014-05-07 MED ORDER — DIPHENHYDRAMINE HCL 25 MG PO CAPS: 25.0000 mg | ORAL_CAPSULE | Freq: Four times a day (QID) | ORAL | Status: DC | PRN

## 2014-05-07 MED ORDER — ALUM & MAG HYDROXIDE-SIMETH 200-200-20 MG/5ML PO SUSP: 30.0000 mL | ORAL | Status: DC | PRN

## 2014-05-07 MED ORDER — DOCUSATE SODIUM 100 MG PO CAPS
100.0000 mg | ORAL_CAPSULE | Freq: Two times a day (BID) | ORAL | Status: DC
  Administered 2014-05-07 – 2014-05-09 (×5): 100 mg via ORAL

## 2014-05-07 MED ORDER — CELECOXIB 200 MG PO CAPS
200.0000 mg | ORAL_CAPSULE | Freq: Two times a day (BID) | ORAL | Status: DC
  Filled 2014-05-07 (×6): qty 1

## 2014-05-07 MED ORDER — CEFAZOLIN SODIUM-DEXTROSE 2-3 GM-% IV SOLR
2.0000 g | Freq: Four times a day (QID) | INTRAVENOUS | Status: AC
  Administered 2014-05-07 (×2): 2 g via INTRAVENOUS
  Filled 2014-05-07 (×2): qty 50

## 2014-05-07 MED ORDER — MEPERIDINE HCL 50 MG/ML IJ SOLN
INTRAMUSCULAR | Status: AC
  Filled 2014-05-07: qty 1

## 2014-05-07 MED ORDER — FENTANYL CITRATE 0.05 MG/ML IJ SOLN
INTRAMUSCULAR | Status: AC
  Filled 2014-05-07: qty 2

## 2014-05-07 MED ORDER — BUPIVACAINE IN DEXTROSE 0.75-8.25 % IT SOLN
INTRATHECAL | Status: DC | PRN
  Administered 2014-05-07: 1.6 mL via INTRATHECAL

## 2014-05-07 MED ORDER — MIDAZOLAM HCL 2 MG/2ML IJ SOLN
INTRAMUSCULAR | Status: AC
  Filled 2014-05-07: qty 2

## 2014-05-07 MED ORDER — LIDOCAINE HCL (CARDIAC) 20 MG/ML IV SOLN
INTRAVENOUS | Status: DC | PRN
  Administered 2014-05-07: 20 mg via INTRAVENOUS

## 2014-05-07 MED ORDER — CHLORHEXIDINE GLUCONATE 4 % EX LIQD: 60.0000 mL | Freq: Once | CUTANEOUS | Status: DC

## 2014-05-07 MED ORDER — PSEUDOEPHEDRINE HCL 30 MG PO TABS
30.0000 mg | ORAL_TABLET | ORAL | Status: DC | PRN
  Filled 2014-05-07: qty 1

## 2014-05-07 MED ORDER — LACTATED RINGERS IV SOLN
INTRAVENOUS | Status: AC
  Administered 2014-05-07: 10:00:00 via INTRAVENOUS
  Administered 2014-05-07: 1000 mL via INTRAVENOUS

## 2014-05-07 MED ORDER — SODIUM CHLORIDE 0.9 % IJ SOLN
INTRAMUSCULAR | Status: AC
  Filled 2014-05-07: qty 10

## 2014-05-07 MED ORDER — ONDANSETRON HCL 4 MG PO TABS: 4.0000 mg | ORAL_TABLET | Freq: Four times a day (QID) | ORAL | Status: DC | PRN

## 2014-05-07 MED ORDER — POLYETHYLENE GLYCOL 3350 17 G PO PACK: 17.0000 g | PACK | Freq: Two times a day (BID) | ORAL | Status: DC

## 2014-05-07 MED ORDER — METHOCARBAMOL 500 MG PO TABS
500.0000 mg | ORAL_TABLET | Freq: Four times a day (QID) | ORAL | Status: DC | PRN
  Administered 2014-05-08 – 2014-05-09 (×4): 500 mg via ORAL
  Filled 2014-05-07 (×4): qty 1

## 2014-05-07 MED ORDER — BISACODYL 10 MG RE SUPP: 10.0000 mg | Freq: Every day | RECTAL | Status: DC | PRN

## 2014-05-07 MED ORDER — PHENYLEPHRINE HCL 10 MG PO TABS: 10.0000 mg | ORAL_TABLET | ORAL | Status: DC | PRN

## 2014-05-07 MED ORDER — PHENOL 1.4 % MT LIQD: 1.0000 | OROMUCOSAL | Status: DC | PRN

## 2014-05-07 MED ORDER — KETOROLAC TROMETHAMINE 30 MG/ML IJ SOLN
INTRAMUSCULAR | Status: AC
  Filled 2014-05-07: qty 1

## 2014-05-07 MED ORDER — MENTHOL 3 MG MT LOZG: 1.0000 | LOZENGE | OROMUCOSAL | Status: DC | PRN

## 2014-05-07 MED ORDER — FENTANYL CITRATE 0.05 MG/ML IJ SOLN
INTRAMUSCULAR | Status: DC | PRN
  Administered 2014-05-07 (×2): 50 ug via INTRAVENOUS

## 2014-05-07 MED ORDER — METOCLOPRAMIDE HCL 10 MG PO TABS: 5.0000 mg | ORAL_TABLET | Freq: Three times a day (TID) | ORAL | Status: DC | PRN

## 2014-05-07 MED ORDER — TRETINOIN 0.1 % EX CREA
TOPICAL_CREAM | Freq: Every day | CUTANEOUS | Status: DC
Start: 2014-05-07 — End: 2014-05-08

## 2014-05-07 MED ORDER — PROPOFOL INFUSION 10 MG/ML OPTIME
INTRAVENOUS | Status: DC | PRN
  Administered 2014-05-07: 100 ug/kg/min via INTRAVENOUS

## 2014-05-07 MED ORDER — DEXAMETHASONE SODIUM PHOSPHATE 10 MG/ML IJ SOLN
10.0000 mg | Freq: Once | INTRAMUSCULAR | Status: DC
  Filled 2014-05-07: qty 1

## 2014-05-07 SURGICAL SUPPLY — 60 items
ADH SKN CLS APL DERMABOND .7 (GAUZE/BANDAGES/DRESSINGS) ×1
BAG SPEC THK2 15X12 ZIP CLS (MISCELLANEOUS)
BAG ZIPLOCK 12X15 (MISCELLANEOUS) IMPLANT
BANDAGE ELASTIC 6 VELCRO ST LF (GAUZE/BANDAGES/DRESSINGS) ×3 IMPLANT
BANDAGE ESMARK 6X9 LF (GAUZE/BANDAGES/DRESSINGS) ×1 IMPLANT
BLADE SAW SGTL 13.0X1.19X90.0M (BLADE) ×3 IMPLANT
BNDG CMPR 9X6 STRL LF SNTH (GAUZE/BANDAGES/DRESSINGS) ×1
BNDG ESMARK 6X9 LF (GAUZE/BANDAGES/DRESSINGS) ×3
BOWL SMART MIX CTS (DISPOSABLE) ×3 IMPLANT
CAP KNEE ATTUNE RP ×2 IMPLANT
CEMENT HV SMART SET (Cement) ×4 IMPLANT
CUFF TOURN SGL QUICK 34 (TOURNIQUET CUFF) ×3
CUFF TRNQT CYL 34X4X40X1 (TOURNIQUET CUFF) ×1 IMPLANT
DERMABOND ADVANCED (GAUZE/BANDAGES/DRESSINGS) ×2
DERMABOND ADVANCED .7 DNX12 (GAUZE/BANDAGES/DRESSINGS) ×1 IMPLANT
DRAPE EXTREMITY T 121X128X90 (DRAPE) ×3 IMPLANT
DRAPE POUCH INSTRU U-SHP 10X18 (DRAPES) ×3 IMPLANT
DRAPE U-SHAPE 47X51 STRL (DRAPES) ×3 IMPLANT
DRSG AQUACEL AG ADV 3.5X10 (GAUZE/BANDAGES/DRESSINGS) ×3 IMPLANT
DURAPREP 26ML APPLICATOR (WOUND CARE) ×6 IMPLANT
ELECT REM PT RETURN 9FT ADLT (ELECTROSURGICAL) ×3
ELECTRODE REM PT RTRN 9FT ADLT (ELECTROSURGICAL) ×1 IMPLANT
FACESHIELD WRAPAROUND (MASK) ×12 IMPLANT
FACESHIELD WRAPAROUND OR TEAM (MASK) ×5 IMPLANT
GLOVE BIO SURGEON STRL SZ8 (GLOVE) ×2 IMPLANT
GLOVE BIOGEL PI IND STRL 6.5 (GLOVE) IMPLANT
GLOVE BIOGEL PI IND STRL 7.5 (GLOVE) ×2 IMPLANT
GLOVE BIOGEL PI IND STRL 8 (GLOVE) ×1 IMPLANT
GLOVE BIOGEL PI IND STRL 8.5 (GLOVE) IMPLANT
GLOVE BIOGEL PI INDICATOR 6.5 (GLOVE) ×2
GLOVE BIOGEL PI INDICATOR 7.5 (GLOVE) ×4
GLOVE BIOGEL PI INDICATOR 8 (GLOVE) ×2
GLOVE BIOGEL PI INDICATOR 8.5 (GLOVE) ×2
GLOVE ECLIPSE 8.0 STRL XLNG CF (GLOVE) ×6 IMPLANT
GLOVE ORTHO TXT STRL SZ7.5 (GLOVE) ×6 IMPLANT
GLOVE SURG SS PI 7.5 STRL IVOR (GLOVE) ×2 IMPLANT
GOWN SPEC L3 XXLG W/TWL (GOWN DISPOSABLE) ×5 IMPLANT
GOWN STRL REUS W/TWL LRG LVL3 (GOWN DISPOSABLE) ×3 IMPLANT
GOWN STRL REUS W/TWL XL LVL3 (GOWN DISPOSABLE) ×3 IMPLANT
HANDPIECE INTERPULSE COAX TIP (DISPOSABLE) ×3
KIT BASIN OR (CUSTOM PROCEDURE TRAY) ×3 IMPLANT
MANIFOLD NEPTUNE II (INSTRUMENTS) ×3 IMPLANT
NDL SAFETY ECLIPSE 18X1.5 (NEEDLE) ×1 IMPLANT
NEEDLE HYPO 18GX1.5 SHARP (NEEDLE) ×3
PACK TOTAL JOINT (CUSTOM PROCEDURE TRAY) ×3 IMPLANT
POSITIONER SURGICAL ARM (MISCELLANEOUS) ×3 IMPLANT
SET HNDPC FAN SPRY TIP SCT (DISPOSABLE) ×1 IMPLANT
SET PAD KNEE POSITIONER (MISCELLANEOUS) ×3 IMPLANT
SUCTION FRAZIER 12FR DISP (SUCTIONS) ×3 IMPLANT
SUT MNCRL AB 4-0 PS2 18 (SUTURE) ×3 IMPLANT
SUT VIC AB 1 CT1 36 (SUTURE) ×3 IMPLANT
SUT VIC AB 2-0 CT1 27 (SUTURE) ×9
SUT VIC AB 2-0 CT1 TAPERPNT 27 (SUTURE) ×3 IMPLANT
SUT VLOC 180 0 24IN GS25 (SUTURE) ×3 IMPLANT
SYRINGE 60CC LL (MISCELLANEOUS) ×3 IMPLANT
TOWEL OR 17X26 10 PK STRL BLUE (TOWEL DISPOSABLE) ×3 IMPLANT
TOWEL OR NON WOVEN STRL DISP B (DISPOSABLE) ×2 IMPLANT
TRAY FOLEY CATH 14FRSI W/METER (CATHETERS) ×3 IMPLANT
WATER STERILE IRR 1500ML POUR (IV SOLUTION) ×3 IMPLANT
WRAP KNEE MAXI GEL POST OP (GAUZE/BANDAGES/DRESSINGS) ×3 IMPLANT

## 2014-05-07 NOTE — Evaluation (Signed)
Physical Therapy Evaluation Patient Details Name: Christina [name] MRN: [id number] DOB: Mar 25, 1951 Today's Date: 05/07/2014   History of Present Illness  LTKA, to have RTKA in 4 weeks  Clinical Impression  Pt tolerated OOB. Pt will benefit from PT to address problems listed in note    Follow Up Recommendations Home health PT;Supervision/Assistance - 24 hour    Equipment Recommendations  Rolling walker with 5" wheels    Recommendations for Other Services       Precautions / Restrictions Precautions Precautions: Knee;Fall      Mobility  Bed Mobility Overal bed mobility: Needs Assistance Bed Mobility: Supine to Sit     Supine to sit: HOB elevated;Min assist     General bed mobility comments: support LLE  Transfers Overall transfer level: Needs assistance Equipment used: Rolling walker (2 wheeled) Transfers: Sit to/from [employer] Transfers Sit to Stand: +2 safety/equipment;Min assist Stand pivot transfers: Min assist;+2 safety/equipment       General transfer comment: pt c/o mildly dizzy, cues for technique  Ambulation/Gait                Stairs            Wheelchair Mobility    Modified Rankin (Stroke Patients Only)       Balance                                             Pertinent Vitals/Pain Pain Assessment: No/denies pain Pain Score: 0-No pain    Home Living Family/patient expects to be discharged to:: Private residence Living Arrangements: Other relatives Available Help at Discharge: Family Type of Home: House       Home Layout: One level Home Equipment: Shower seat Additional Comments: high rise toilet    Prior Function Level of Independence: Independent               Hand Dominance        Extremity/Trunk Assessment   Upper Extremity Assessment: Overall WFL for tasks assessed           Lower Extremity Assessment: RLE deficits/detail;LLE deficits/detail RLE Deficits / Details:  noted decreased knee extension LLE Deficits / Details: SLR with min assist, knee flexion to 60     Communication   Communication: No difficulties  Cognition Arousal/Alertness: Awake/alert Behavior During Therapy: WFL for tasks assessed/performed Overall Cognitive Status: Within Functional Limits for tasks assessed                      General Comments      Exercises Total Joint Exercises Ankle Circles/Pumps: AROM;Both;10 reps Quad Sets: AROM;Both;10 reps;Supine      Assessment/Plan    PT Assessment Patient needs continued PT services  PT Diagnosis Difficulty walking   PT Problem List Decreased strength;Decreased range of motion;Decreased activity tolerance;Decreased mobility;Decreased safety awareness;Decreased knowledge of use of DME;Decreased knowledge of precautions  PT Treatment Interventions DME instruction;Gait training;Stair training;Functional mobility training;Therapeutic exercise;Patient/family education   PT Goals (Current goals can be found in the Care Plan section) Acute Rehab PT Goals Patient Stated Goal: to get these knees fixed PT Goal Formulation: With patient Time For Goal Achievement: 05/11/14 Potential to Achieve Goals: Good    Frequency 7X/week   Barriers to discharge        Co-evaluation  End of Session   Activity Tolerance: Patient tolerated treatment well Patient left: in chair;with call bell/phone within reach Nurse Communication: Mobility status         Time: 0383-3383 PT Time Calculation (min): 28 min   Charges:   PT Evaluation $Initial PT Evaluation Tier I: 1 Procedure PT Treatments $Gait Training: 23-37 mins   PT G Codes:          Claretha Cooper 05/07/2014, 5:32 PM

## 2014-05-07 NOTE — Op Note (Signed)
NAME:  Christina [name]                      MEDICAL RECORD NO.:  [id number]                             FACILITY:  [facility]      PHYSICIAN:  Pietro Cassis. Alvan Dame, M.D.  DATE OF BIRTH:  [birth year]      DATE OF PROCEDURE:  05/07/2014                                     OPERATIVE REPORT         PREOPERATIVE DIAGNOSIS:  Left knee osteoarthritis.      POSTOPERATIVE DIAGNOSIS:  Left knee osteoarthritis.      FINDINGS:  The patient was noted to have complete loss of cartilage and   bone-on-bone arthritis with associated osteophytes in all three compartments of   the knee with a significant synovitis, associated effusion and at least 10 degree flexion contracture.      PROCEDURE:  Left total knee replacement.      COMPONENTS USED:  DePuy Attune rotating platform posterior stabilized knee   system, a size 4 femur, 5 tibia, size 7 mm PS AOX insert, and 35 anatomic AOX patellar   button.      SURGEON:  Pietro Cassis. Alvan Dame, M.D.      ASSISTANT:  Danae Orleans, PA-C.      ANESTHESIA:  Spinal.      SPECIMENS:  None.      COMPLICATION:  None.      DRAINS:  None  EBL: <100cc      TOURNIQUET TIME:   Total Tourniquet Time Documented: Thigh (Left) - 36 minutes Total: Thigh (Left) - 36 minutes  .      The patient was stable to the recovery room.      INDICATION FOR PROCEDURE:  [name] is a 63 y.o. female patient of   mine.  The patient had been seen, evaluated, and treated conservatively in the   office with medication, activity modification, and injections.  The patient had   radiographic changes of bone-on-bone arthritis with endplate sclerosis and osteophytes noted.      The patient failed conservative measures including medication, injections, and activity modification, and at this point was ready for more definitive measures.   Based on the radiographic changes and failed conservative measures, the patient   decided to proceed with total knee replacement.  Risks of infection,   DVT,  component failure, need for revision surgery, postop course, and   expectations were all   discussed and reviewed.  Consent was obtained for benefit of pain   relief.      PROCEDURE IN DETAIL:  The patient was brought to the operative theater.   Once adequate anesthesia, preoperative antibiotics, 2 gm of Ancef administered, the patient was positioned supine with the left thigh tourniquet placed.  The  left lower extremity was prepped and draped in sterile fashion.  A time-   out was performed identifying the patient, planned procedure, and   extremity.      The left lower extremity was placed in the [facility] leg holder.  The leg was   exsanguinated, tourniquet elevated to 250 mmHg.  A midline incision was   made followed by median parapatellar arthrotomy.  Following initial   exposure, attention was first directed to the patella.  Precut   measurement was noted to be 25 mm.  I resected down to 14 mm and used a   35 patellar button to restore patellar height as well as cover the cut   surface.     She was noted at this point to have significantly osteoporotic bone quality.  There were no related complications due to this but care was taken due to these findings.  The lug holes were drilled and a metal shim was placed to protect the   patella from retractors and saw blades.      At this point, attention was now directed to the femur.  The femoral   canal was opened with a drill, irrigated to try to prevent fat emboli.  An   intramedullary rod was passed at 3 degrees valgus, 10 mm of bone was   resected off the distal femur due to her flexion contracture.  Following this resection, the tibia was   subluxated anteriorly.  Using the extramedullary guide, 2 mm of bone was resected off   the proximal medial tibia.  We confirmed the gap would be   stable medially and laterally with a size 6 insert as well as confirmed   the cut was perpendicular in the coronal plane, checking with an alignment  rod.      Once this was done, I sized the femur to be a size 4 in the anterior-   posterior dimension, chose a standard component based on medial and   lateral dimension.  The size 4 rotation block was then pinned in   position anterior referenced using the tensioning device to set rotation and match flexion and extension gaps.  The   anterior, posterior, and  chamfer cuts were made without difficulty nor   notching making certain that I was along the anterior cortex to help   with flexion gap stability.      The final box cut was made off the lateral aspect of distal femur.      At this point, the tibia was sized to be a size 5, the size 5 tray was   then pinned in position through the medial third of the tubercle,   drilled, and keel punched.  Trial reduction was now carried with a 4 femur,  5 tibia, a size7 mm insert, and the 35 patella botton.  The knee was brought to   extension, full extension with good flexion stability with the patella   tracking through the trochlea without application of pressure.  Given   all these findings, the trial components removed.  Final components were   opened and cement was mixed.  The knee was irrigated with normal saline   solution and pulse lavage.  The synovial lining was   then injected with 20cc of Exparel, 30cc 0.25% Marcaine with epinephrine and 1 cc of Toradol,   total of 61 cc.      The knee was irrigated.  Final implants were then cemented onto clean and   dried cut surfaces of bone with the knee brought to extension with a size 7   mm trial insert.      Once the cement had fully cured, the excess cement was removed   throughout the knee.  I confirmed I was satisfied with the range of   motion and stability, and the final size 7 mm PS insert was chosen.  It was  placed into the knee.      The tourniquet had been let down at 36 minutes.  No significant   hemostasis required.  The extensor mechanism was then reapproximated using #1  Vicryl with the knee   in flexion.  The remaining wound was closed with 2-0 Vicryl and running 4-0 Monocryl.   The knee was cleaned, dried, dressed sterilely using Dermabond and   Aquacel dressing.  The patient was then   brought to recovery room in stable condition, tolerating the procedure   well.   Please note that Physician Assistant, Danae Orleans, was present for the entirety of the case, and was utilized for pre-operative positioning, peri-operative retractor management, general facilitation of the procedure.  He was also utilized for primary wound closure at the end of the case.              Pietro Cassis Alvan Dame, M.D.    05/07/2014 12:06 PM

## 2014-05-07 NOTE — Anesthesia Postprocedure Evaluation (Signed)
  Anesthesia Post-op Note  Patient: Christina [name]  Procedure(s) Performed: Procedure(s) (LRB): LEFT TOTAL KNEE ARTHROPLASTY (Left)  Patient Location: PACU  Anesthesia Type: Spinal  Level of Consciousness: awake and alert   Airway and Oxygen Therapy: Patient Spontanous Breathing  Post-op Pain: mild  Post-op Assessment: Post-op Vital signs reviewed, Patient's Cardiovascular Status Stable, Respiratory Function Stable, Patent Airway and No signs of Nausea or vomiting  Last Vitals:  Filed Vitals:   05/07/14 1523  BP: 134/83  Pulse:   Temp: 36.3 C  Resp: 15    Post-op Vital Signs: stable   Complications: No apparent anesthesia complications

## 2014-05-07 NOTE — Progress Notes (Signed)
[facility]  [facility] is providing the following services: RW (patient declined commode)  If patient discharges after hours, please call [phone].   Linward Headland 05/07/2014, 12:41 PM

## 2014-05-07 NOTE — Interval H&P Note (Signed)
History and Physical Interval Note:  05/07/2014 7:26 AM  Christina [name]  has presented today for surgery, with the diagnosis of LEFT KNEE OA  The various methods of treatment have been discussed with the patient and family. After consideration of risks, benefits and other options for treatment, the patient has consented to  Procedure(s): LEFT TOTAL KNEE ARTHROPLASTY (Left) as a surgical intervention .  The patient's history has been reviewed, patient examined, no change in status, stable for surgery.  I have reviewed the patient's chart and labs.  Questions were answered to the patient's satisfaction.     Mauri Pole

## 2014-05-07 NOTE — Anesthesia Preprocedure Evaluation (Addendum)
Anesthesia Evaluation  Patient identified by MRN, date of birth, ID band Patient awake    Reviewed: Allergy & Precautions, H&P , NPO status , Patient's Chart, lab work & pertinent test results  Airway Mallampati: II TM Distance: >3 FB Neck ROM: Full    Dental no notable dental hx. (+) Caps,    Pulmonary neg pulmonary ROS,  breath sounds clear to auscultation  Pulmonary exam normal       Cardiovascular hypertension, negative cardio ROS  Rhythm:Regular Rate:Normal     Neuro/Psych negative neurological ROS  negative psych ROS   GI/Hepatic negative GI ROS, Neg liver ROS,   Endo/Other  negative endocrine ROS  Renal/GU negative Renal ROS  negative genitourinary   Musculoskeletal negative musculoskeletal ROS (+)   Abdominal   Peds negative pediatric ROS (+)  Hematology negative hematology ROS (+)   Anesthesia Other Findings   Reproductive/Obstetrics negative OB ROS                          Anesthesia Physical Anesthesia Plan  ASA: II  Anesthesia Plan: Spinal   Post-op Pain Management:    Induction:   Airway Management Planned: Simple Face Mask  Additional Equipment:   Intra-op Plan:   Post-operative Plan:   Informed Consent: I have reviewed the patients History and Physical, chart, labs and discussed the procedure including the risks, benefits and alternatives for the proposed anesthesia with the patient or authorized representative who has indicated his/her understanding and acceptance.   Dental advisory given  Plan Discussed with: CRNA  Anesthesia Plan Comments:         Anesthesia Quick Evaluation

## 2014-05-07 NOTE — Anesthesia Procedure Notes (Signed)
Spinal  Patient location during procedure: OR Start time: 05/07/2014 8:48 AM End time: 05/07/2014 8:52 AM Staffing Anesthesiologist: Montez Hageman CRNA/Resident: Lajuana Carry E Performed by: resident/CRNA  Preanesthetic Checklist Completed: patient identified, site marked, surgical consent, pre-op evaluation, timeout performed, IV checked, risks and benefits discussed and monitors and equipment checked Spinal Block Patient position: sitting Prep: Betadine Patient monitoring: heart rate, continuous pulse ox and blood pressure Approach: midline Location: L3-4 Injection technique: single-shot Needle Needle type: Sprotte  Needle gauge: 24 G Needle length: 10 cm Assessment Sensory level: T6 Additional Notes Time out performed, kit expiration checked, sitting position, sterile prep and drape. Clear CSF, neg heme first attempt. Clear CSF pre/post injection. Tol well, return to supine

## 2014-05-07 NOTE — Transfer of Care (Signed)
Immediate Anesthesia Transfer of Care Note  Patient: Christina [name]  Procedure(s) Performed: Procedure(s) (LRB): LEFT TOTAL KNEE ARTHROPLASTY (Left)  Patient Location: PACU  Anesthesia Type: Spinal  Level of Consciousness: sedated, patient cooperative and responds to stimulation  Airway & Oxygen Therapy: Patient Spontanous Breathing and Patient connected to face mask oxgen  Post-op Assessment: Report given to PACU RN and Post -op Vital signs reviewed and stable  Post vital signs: Reviewed and stable  Complications: No apparent anesthesia complications

## 2014-05-08 ENCOUNTER — Encounter (HOSPITAL_COMMUNITY): Payer: Self-pay | Admitting: Orthopedic Surgery

## 2014-05-08 LAB — CBC
HCT: 31.8 % — ABNORMAL LOW (ref 36.0–46.0)
HEMOGLOBIN: 10.8 g/dL — AB (ref 12.0–15.0)
MCH: 30.5 pg (ref 26.0–34.0)
MCHC: 34 g/dL (ref 30.0–36.0)
MCV: 89.8 fL (ref 78.0–100.0)
Platelets: 236 10*3/uL (ref 150–400)
RBC: 3.54 MIL/uL — ABNORMAL LOW (ref 3.87–5.11)
RDW: 13.7 % (ref 11.5–15.5)
WBC: 12.7 10*3/uL — AB (ref 4.0–10.5)

## 2014-05-08 LAB — BASIC METABOLIC PANEL
Anion gap: 13 (ref 5–15)
BUN: 9 mg/dL (ref 6–23)
CO2: 20 meq/L (ref 19–32)
Calcium: 8.9 mg/dL (ref 8.4–10.5)
Chloride: 101 mEq/L (ref 96–112)
Creatinine, Ser: 0.71 mg/dL (ref 0.50–1.10)
GFR calc non Af Amer: >90 mL/min (ref >90)
Glucose, Bld: 138 mg/dL — ABNORMAL HIGH (ref 70–99)
POTASSIUM: 4.7 meq/L (ref 3.7–5.3)
Sodium: 134 mEq/L — ABNORMAL LOW (ref 137–147)

## 2014-05-08 MED ORDER — HYDROCODONE-ACETAMINOPHEN 7.5-325 MG PO TABS: 1.0000 | ORAL_TABLET | ORAL | Status: DC | PRN

## 2014-05-08 MED ORDER — METHOCARBAMOL 500 MG PO TABS: 500.0000 mg | ORAL_TABLET | Freq: Four times a day (QID) | ORAL | Status: DC | PRN

## 2014-05-08 MED ORDER — ASPIRIN 325 MG PO TBEC: 325.0000 mg | DELAYED_RELEASE_TABLET | Freq: Two times a day (BID) | ORAL | Status: DC

## 2014-05-08 MED ORDER — PROMETHAZINE HCL 25 MG/ML IJ SOLN
6.2500 mg | Freq: Four times a day (QID) | INTRAMUSCULAR | Status: DC | PRN
  Administered 2014-05-08: 12.5 mg via INTRAVENOUS
  Filled 2014-05-08: qty 1

## 2014-05-08 MED ORDER — DSS 100 MG PO CAPS: 100.0000 mg | ORAL_CAPSULE | Freq: Two times a day (BID) | ORAL | Status: DC

## 2014-05-08 MED ORDER — POLYETHYLENE GLYCOL 3350 17 G PO PACK: 17.0000 g | PACK | Freq: Two times a day (BID) | ORAL | Status: DC

## 2014-05-08 MED ORDER — FERROUS SULFATE 325 (65 FE) MG PO TABS: 325.0000 mg | ORAL_TABLET | Freq: Three times a day (TID) | ORAL | Status: DC

## 2014-05-08 NOTE — Progress Notes (Signed)
Physical Therapy Treatment Patient Details Name: Christina [name] MRN: [id number] DOB: [birth year] Today's Date: 05/08/2014    History of Present Illness LTKA, to have RTKA in 4 weeks    PT Comments    Pt all of sudden became nauseated/emesis.. Plans DC tomorrow.  Follow Up Recommendations  Home health PT;Supervision/Assistance - 24 hour     Equipment Recommendations  Rolling walker with 5" wheels    Recommendations for Other Services       Precautions / Restrictions Precautions Precautions: Knee;Fall Restrictions Weight Bearing Restrictions: No    Mobility  Bed Mobility   Bed Mobility: Supine to Sit     Supine to sit: [facility] elevated;Min assist     General bed mobility comments: cues for safety from high bed.  Transfers Overall transfer level: Needs assistance Equipment used: Rolling walker (2 wheeled) Transfers: Sit to/from Stand Sit to Stand: Min assist         General transfer comment: some dizziness reported. verbal cues for hand placement.   Ambulation/Gait Ambulation/Gait assistance: Min assist Ambulation Distance (Feet): 25 Feet (10' first, limited by N/V then ambulated x 25') Assistive device: Rolling walker (2 wheeled) Gait Pattern/deviations: Step-to pattern;Decreased step length - left     General Gait Details: pt tolerated  well, some dizziness, N/V.    Stairs            Wheelchair Mobility    Modified Rankin (Stroke Patients Only)       Balance                                    Cognition Arousal/Alertness: Awake/alert Behavior During Therapy: WFL for tasks assessed/performed Overall Cognitive Status: Within Functional Limits for tasks assessed                      Exercises Total Joint Exercises Ankle Circles/Pumps: AROM;Both;10 reps Quad Sets: AROM;Both;10 reps;Supine Short Arc Quad: AAROM;Left;10 reps;Supine Heel Slides: AAROM;Left;10 reps;Supine Hip ABduction/ADduction: AAROM;Left;10  reps;Supine Straight Leg Raises: AAROM;Left;10 reps;Supine Goniometric ROM: 10-50 L    General Comments General comments (skin integrity, edema, etc.): min assist in standing to manage gown.      Pertinent Vitals/Pain Pain Assessment: 0-10 Pain Score: 1  Pain Location: L knee Pain Descriptors / Indicators: Aching Pain Intervention(s): Repositioned;Ice applied    Home Living Family/patient expects to be discharged to:: Private residence Living Arrangements: Other relatives Available Help at Discharge: Family Type of Home: House     Home Layout: One level Home Equipment: Shower seat Additional Comments: high rise toilet. has a grab bar that can attach onto tub to help stand at toilet. vanity on the other side.    Prior Function Level of Independence: Independent          PT Goals (current goals can now be found in the care plan section) Acute Rehab PT Goals Patient Stated Goal: to be independent again. Progress towards PT goals: Progressing toward goals    Frequency  7X/week    PT Plan Current plan remains appropriate    Co-evaluation             End of Session   Activity Tolerance: Treatment limited secondary to medical complications (Comment) (N/V)       Time: 9924-2683 PT Time Calculation (min): 41 min  Charges:  $Gait Training: 8-22 mins $Therapeutic Exercise: 8-22 mins  G Codes:      Claretha Cooper 05/08/2014, 10:48 AM Tresa Endo PT [phone]

## 2014-05-08 NOTE — Progress Notes (Signed)
Patient ID: Christina [name], female   DOB: Oct 17, 1950, 63 y.o.   MRN: [id number] Subjective: 1 Day Post-Op Procedure(s) (LRB): LEFT TOTAL KNEE ARTHROPLASTY (Left)    Patient reports pain as mild. Tolerating post op course well.  Anxious about recovery process  Objective:   VITALS:   Filed Vitals:   05/08/14 0634  BP: 117/76  Pulse: 67  Temp: 98.1 F (36.7 C)  Resp:     Neurovascular intact Incision: dressing C/D/I  LABS  Recent Labs  05/08/14 0518  HGB 10.8*  HCT 31.8*  WBC 12.7*  PLT 236     Recent Labs  05/08/14 0518  NA 134*  K 4.7  BUN 9  CREATININE 0.71  GLUCOSE 138*    No results found for this basename: LABPT, INR,  in the last 72 hours   Assessment/Plan: 1 Day Post-Op Procedure(s) (LRB): LEFT TOTAL KNEE ARTHROPLASTY (Left)   Advance diet Up with therapy Plan for discharge tomorrow to home  Plan to go home with CPM per home health

## 2014-05-08 NOTE — Progress Notes (Signed)
CARE MANAGEMENT NOTE 05/08/2014  Patient:  Christina [name], Christina [name]   Account Number:  [id number]  Date Initiated:  05/08/2014  Documentation initiated by:  DAVIS,RHONDA  Subjective/Objective Assessment:   left total knee replacement     Action/Plan:   home with hhc  and dme   Anticipated DC Date:  05/11/2014   Anticipated DC Plan:  [place] referral  NA      DC Planning Services  CM consult      [facility] Choice  NA   Choice offered to / List presented to:  C-1 Patient   DME arranged  [place]      DME agency  [place] arranged  HH-2 PT      [place].   Status of service:  In process, will continue to follow Medicare Important Message given?  NA - LOS <3 / Initial given by admissions (If response is "NO", the following Medicare IM given date fields will be blank) Date Medicare IM given:   Medicare IM given by:   Date Additional Medicare IM given:   Additional Medicare IM given by:    Discharge Disposition:    Per UR Regulation:  Reviewed for med. necessity/level of care/duration of stay  If discussed at [place] of Stay Meetings, dates discussed:    Comments:  Rhonda Davis,RN,BSN,CCM tct-Brent Haskell with [employer]/will contact patient and arrange for delivery ofr cpm for home.

## 2014-05-08 NOTE — Progress Notes (Signed)
Physical Therapy Treatment Patient Details Name: Christina [name] MRN: [id number] DOB: Aug 19, 1951 Today's Date: 05/08/2014    History of Present Illness LTKA, to have RTKA in 4 weeks    PT Comments    Less nausea with meds. Possible Dc tomorrow.  Follow Up Recommendations  Home health PT;Supervision/Assistance - 24 hour     Equipment Recommendations  Rolling walker with 5" wheels    Recommendations for Other Services       Precautions / Restrictions Precautions Precautions: Knee;Fall    Mobility  Bed Mobility   Bed Mobility: Sit to Supine     Supine to sit: Min assist     General bed mobility comments: support L leg  Transfers   Equipment used: Rolling walker (2 wheeled) Transfers: Sit to/from Stand Sit to Stand: Min guard         General transfer comment: cues for hand and L leg position  Ambulation/Gait Ambulation/Gait assistance: Min assist Ambulation Distance (Feet): 20 Feet (x2) Assistive device: Rolling walker (2 wheeled)       General Gait Details: pt has had nausea but tolerated  this session without.   Stairs            Wheelchair Mobility    Modified Rankin (Stroke Patients Only)       Balance                                    Cognition Arousal/Alertness: Awake/alert                          Exercises      General Comments        Pertinent Vitals/Pain Pain Score: 3  Pain Location: L knee Pain Descriptors / Indicators: Aching Pain Intervention(s): RN gave pain meds during session;Ice applied    Home Living                      Prior Function            PT Goals (current goals can now be found in the care plan section) Progress towards PT goals: Progressing toward goals    Frequency  7X/week    PT Plan Current plan remains appropriate    Co-evaluation             End of Session   Activity Tolerance: Patient tolerated treatment well Patient left: in bed;with call  bell/phone within reach     Time: 1510-1526 PT Time Calculation (min): 16 min  Charges:  $Gait Training: 8-22 mins                    G Codes:      Claretha Cooper 05/08/2014, 6:17 PM

## 2014-05-08 NOTE — Evaluation (Signed)
Occupational Therapy Evaluation Patient Details Name: Christina [name] MRN: [id number] DOB: [birth year] Today's Date: 05/08/2014    History of Present Illness LTKA, to have RTKA in 4 weeks   Clinical Impression   Pt became nauseous after toilet transfer and vomited multiple times. Nursing made aware. She mobilized on and off toilet with min assist and walker. She will benefit from continued OT to progress ADL independence.    Follow Up Recommendations  No OT follow up;Supervision/Assistance - 24 hour    Equipment Recommendations  None recommended by OT    Recommendations for Other Services       Precautions / Restrictions Precautions Precautions: Knee;Fall Restrictions Weight Bearing Restrictions: No      Mobility Bed Mobility                  Transfers Overall transfer level: Needs assistance Equipment used: Rolling walker (2 wheeled) Transfers: Sit to/from Stand Sit to Stand: Min assist         General transfer comment: some dizziness reported. verbal cues for hand placement. Min assist to descend safely as pt tried to sit prematurely due to need to vomit.     Balance                                            ADL Overall ADL's : Needs assistance/impaired Eating/Feeding: Independent;Sitting   Grooming: Wash/dry hands;Minimal assistance;Standing   Upper Body Bathing: Set up;Sitting;Supervision/ safety   Lower Body Bathing: Moderate assistance;Sit to/from stand   Upper Body Dressing : Set up;Supervision/safety;Sitting   Lower Body Dressing: Moderate assistance;Sit to/from stand   Toilet Transfer: Minimal assistance;Ambulation;Comfort height toilet;RW;Grab bars   Toileting- Clothing Manipulation and Hygiene: Minimal assistance;Sit to/from stand         General ADL Comments: Pt states she has a clamp on grab bar for tub that she can use to help stand from toilet. She has a vanity on the other side. Demonstrated AE but pt states  sister can assist also. She was nauseous during session and vomited about 5 times at end of session. Nursing called to room.  Pt tried to sit down quickly when nausea came on before she fully was up against chair so educated pt on making sure she backs up to the chair fully before letting go of walker.      Vision                     Perception     Praxis      Pertinent Vitals/Pain Pain Assessment: 0-10 Pain Score: 1  Pain Location: L knee Pain Descriptors / Indicators: Aching Pain Intervention(s): Repositioned;Ice applied     Hand Dominance     Extremity/Trunk Assessment Upper Extremity Assessment Upper Extremity Assessment: Overall WFL for tasks assessed           Communication Communication Communication: No difficulties   Cognition Arousal/Alertness: Awake/alert Behavior During Therapy: WFL for tasks assessed/performed Overall Cognitive Status: Within Functional Limits for tasks assessed                     General Comments       Exercises       Shoulder Instructions      Home Living Family/patient expects to be discharged to:: Private residence Living Arrangements: Other relatives Available Help at Discharge: Family Type of Home:  House       Home Layout: One level     Bathroom Shower/Tub: Teacher, early years/pre: Handicapped height     Home Equipment: Shower seat   Additional Comments: high rise toilet. has a grab bar that can attach onto tub to help stand at toilet. vanity on the other side.      Prior Functioning/Environment Level of Independence: Independent             OT Diagnosis: Generalized weakness   OT Problem List: Decreased strength;Decreased knowledge of use of DME or AE   OT Treatment/Interventions: Self-care/ADL training;Patient/family education;Therapeutic activities;DME and/or AE instruction    OT Goals(Current goals can be found in the care plan section) Acute Rehab OT Goals Patient Stated  Goal: to be independent again. OT Goal Formulation: With patient Time For Goal Achievement: 05/15/14 Potential to Achieve Goals: Good  OT Frequency: Min 2X/week   Barriers to D/C:            Co-evaluation              End of Session Equipment Utilized During Treatment: Gait belt;Rolling walker CPM Left Knee CPM Left Knee: Off  Activity Tolerance: Other (comment) (nausea and some dizziness) Patient left: in chair;with call bell/phone within reach   Time: 9179-1505 OT Time Calculation (min): 24 min Charges:  OT General Charges $OT Visit: 1 Procedure OT Evaluation $Initial OT Evaluation Tier I: 1 Procedure OT Treatments $Therapeutic Activity: 8-22 mins G-Codes:    Jules Schick [phone] 05/08/2014, 10:05 AM

## 2014-05-09 DIAGNOSIS — E669 Obesity, unspecified: Secondary | ICD-10-CM | POA: Diagnosis present

## 2014-05-09 DIAGNOSIS — D62 Acute posthemorrhagic anemia: Secondary | ICD-10-CM | POA: Diagnosis not present

## 2014-05-09 LAB — CBC
HEMATOCRIT: 28.7 % — AB (ref 36.0–46.0)
Hemoglobin: 9.7 g/dL — ABNORMAL LOW (ref 12.0–15.0)
MCH: 30.1 pg (ref 26.0–34.0)
MCHC: 33.8 g/dL (ref 30.0–36.0)
MCV: 89.1 fL (ref 78.0–100.0)
Platelets: 220 10*3/uL (ref 150–400)
RBC: 3.22 MIL/uL — ABNORMAL LOW (ref 3.87–5.11)
RDW: 14.1 % (ref 11.5–15.5)
WBC: 9.4 10*3/uL (ref 4.0–10.5)

## 2014-05-09 LAB — BASIC METABOLIC PANEL
ANION GAP: 10 (ref 5–15)
BUN: 8 mg/dL (ref 6–23)
CO2: 25 mEq/L (ref 19–32)
Calcium: 8.7 mg/dL (ref 8.4–10.5)
Chloride: 100 mEq/L (ref 96–112)
Creatinine, Ser: 0.77 mg/dL (ref 0.50–1.10)
GFR calc non Af Amer: 89 mL/min — ABNORMAL LOW (ref >90)
Glucose, Bld: 95 mg/dL (ref 70–99)
Potassium: 4.2 mEq/L (ref 3.7–5.3)
Sodium: 135 mEq/L — ABNORMAL LOW (ref 137–147)

## 2014-05-09 NOTE — Progress Notes (Signed)
Physical Therapy Treatment Patient Details Name: Christina [name] MRN: [id number] DOB: April 03, 1951 Today's Date: 05/09/2014    History of Present Illness LTKA, to have RTKA in 4 weeks    PT Comments    *Good progress with mobility. Pt walked 29' with RW.  Pain limits activity tolerance. Ready to DC home from PT standpoint. **  Follow Up Recommendations  Home health PT;Supervision/Assistance - 24 hour     Equipment Recommendations  Rolling walker with 5" wheels    Recommendations for Other Services       Precautions / Restrictions Precautions Precautions: Knee;Fall Restrictions Weight Bearing Restrictions: No    Mobility  Bed Mobility Overal bed mobility: Needs Assistance Bed Mobility: Supine to Sit     Supine to sit: Min guard;HOB elevated        Transfers Overall transfer level: Modified independent Equipment used: Rolling walker (2 wheeled) Transfers: Sit to/from Stand Sit to Stand: Modified independent (Device/Increase time)         General transfer comment: verbal cues hand placement.  Ambulation/Gait Ambulation/Gait assistance: Modified independent (Device/Increase time) Ambulation Distance (Feet): 90 Feet Assistive device: Rolling walker (2 wheeled) Gait Pattern/deviations: Step-to pattern   Gait velocity interpretation: Below normal speed for age/gender General Gait Details: good sequencing, no LOB, pt noted RLE feels shorter than LLE   Stairs Stairs: Yes Stairs assistance: Min assist Stair Management: No rails;With walker;Backwards Number of Stairs: 1 General stair comments: pt's sister present for stair training, verbal cues for technique  Wheelchair Mobility    Modified Rankin (Stroke Patients Only)       Balance                                    Cognition Arousal/Alertness: Awake/alert Behavior During Therapy: WFL for tasks assessed/performed Overall Cognitive Status: Within Functional Limits for tasks assessed                      Exercises Total Joint Exercises Ankle Circles/Pumps: AROM;Both;10 reps Quad Sets: AROM;Both;10 reps;Supine Short Arc Quad: AAROM;Left;10 reps;Supine Heel Slides: AAROM;Left;10 reps;Supine Hip ABduction/ADduction: AAROM;Left;10 reps;Supine Straight Leg Raises: AAROM;Left;10 reps;Supine Goniometric ROM: 10-40* L knee    General Comments        Pertinent Vitals/Pain Pain Assessment: 0-10 Pain Score: 6  Pain Location: L knee with walking Pain Descriptors / Indicators: Aching Pain Intervention(s): Monitored during session;Ice applied;Premedicated before session    Home Living                      Prior Function            PT Goals (current goals can now be found in the care plan section) Acute Rehab PT Goals Patient Stated Goal: to be independent again, be able to keep up with a dog Time For Goal Achievement: 05/11/14 Potential to Achieve Goals: Good Progress towards PT goals: Progressing toward goals    Frequency  7X/week    PT Plan Current plan remains appropriate    Co-evaluation             End of Session Equipment Utilized During Treatment: Gait belt Activity Tolerance: Patient tolerated treatment well Patient left: in chair;with call bell/phone within reach;with family/visitor present     Time: 2563-8937 PT Time Calculation (min): 23 min  Charges:  $Gait Training: 8-22 mins $Therapeutic Exercise: 8-22 mins  G Codes:      Philomena Doheny 05/09/2014, 10:25 AM [phone]

## 2014-05-09 NOTE — Care Management Note (Signed)
    Page 1 of 2   05/09/2014     12:07:32 PM CARE MANAGEMENT NOTE 05/09/2014  Patient:  Christina [name], Christina [name]   Account Number:  [id number]  Date Initiated:  05/08/2014  Documentation initiated by:  DAVIS,RHONDA  Subjective/Objective Assessment:   left total knee replacement     Action/Plan:   home with hhc  and dme   Anticipated DC Date:  05/11/2014   Anticipated DC Plan:  [facility] referral  NA      DC Planning Services  CM consult      [facility] Choice  NA   Choice offered to / List presented to:  C-1 Patient   DME arranged  [place]      DME agency  [place] arranged  HH-2 PT      [place].   Status of service:  Completed, signed off Medicare Important Message given?  NA - LOS <3 / Initial given by admissions (If response is "NO", the following Medicare IM given date fields will be blank) Date Medicare IM given:   Medicare IM given by:   Date Additional Medicare IM given:   Additional Medicare IM given by:    Discharge Disposition:  [place]  Per UR Regulation:  Reviewed for med. necessity/level of care/duration of stay  If discussed at [place] of Stay Meetings, dates discussed:    Comments:  Rhonda Davis,RN,BSN,CCM tct-Brent Haskell with [employer]/will contact patient and arrange for delivery ofr cpm for home.

## 2014-05-09 NOTE — Progress Notes (Signed)
Occupational Therapy Treatment Patient Details Name: Christina [name] MRN: [id number] DOB: Jan 01, 1951 Today's Date: 05/09/2014    History of present illness LTKA, to have RTKA in 4 weeks   OT comments  Pt did well. Practiced toilet transfers and dressing tasks. Discussed safety considerations with all.   Follow Up Recommendations  Supervision/Assistance - 24 hour;No OT follow up    Equipment Recommendations  None recommended by OT    Recommendations for Other Services      Precautions / Restrictions Precautions Precautions: Knee;Fall Restrictions Weight Bearing Restrictions: No       Mobility Bed Mobility Overal bed mobility: Needs Assistance Bed Mobility: Supine to Sit     Supine to sit: Min guard;HOB elevated        Transfers Overall transfer level: Needs assistance Equipment used: Rolling walker (2 wheeled) Transfers: Sit to/from Stand Sit to Stand: Min guard         General transfer comment: verbal cues hand placement.    Balance                                   ADL       Grooming: Min guard;Standing               Lower Body Dressing: Min guard;Sit to/from stand Lower Body Dressing Details (indicate cue type and reason): for donning underwear and skirt only. she plans to wear slide on shoe and not wear socks. see below. Toilet Transfer: Min guard;Comfort height toilet;Grab bars;RW   Writer and Hygiene: Min guard;Sit to/from stand         General ADL Comments: Pt only had slide in sandal and discussed slip on shoe with a backing on them for safety. She plans to not wear socks. Her sister is available to assist but she did well with donning underwear and skirt without AE. Needed min cues to remember to use walker all the way to the chair and not leave to the side and step around to the chair and also use walker when standing to pull up clothing. She plans to sponge bathe another day or so and let HH assess  tub transfers. No dizziness reported today.       Vision                     Perception     Praxis      Cognition   Behavior During Therapy: WFL for tasks assessed/performed Overall Cognitive Status: Within Functional Limits for tasks assessed                       Extremity/Trunk Assessment               Exercises     Shoulder Instructions       General Comments      Pertinent Vitals/ Pain       Pain Assessment: 0-10 Pain Score: 2  Pain Descriptors / Indicators: Sore Pain Intervention(s): Ice applied;Repositioned  Home Living                                          Prior Functioning/Environment              Frequency Min 2X/week     Progress Toward Goals  OT Goals(current goals can now be found in the care plan section)  Progress towards OT goals: Progressing toward goals     Plan Discharge plan remains appropriate    Co-evaluation                 End of Session Equipment Utilized During Treatment: Rolling walker CPM Left Knee CPM Left Knee: Off   Activity Tolerance Patient tolerated treatment well   Patient Left in chair;with call bell/phone within reach   Nurse Communication          Time: 6283-1517 OT Time Calculation (min): 36 min  Charges: OT General Charges $OT Visit: 1 Procedure OT Treatments $Self Care/Home Management : 8-22 mins $Therapeutic Activity: 8-22 mins  Jules Schick [phone] 05/09/2014, 9:22 AM

## 2014-05-09 NOTE — Progress Notes (Signed)
     Subjective: 2 Days Post-Op Procedure(s) (LRB): LEFT TOTAL KNEE ARTHROPLASTY (Left)   Patient reports pain as mild, pain controlled. Small increase of pain during the night, otherwise no events throughout the night. Ready to be discharged home.  Objective:   VITALS:   Filed Vitals:   05/09/14 0441  BP: 117/75  Pulse: 64  Temp: 98 F (36.7 C)  Resp: 16    Dorsiflexion/Plantar flexion intact Incision: dressing C/D/I No cellulitis present Compartment soft  LABS  Recent Labs  05/08/14 0518 05/09/14 0424  HGB 10.8* 9.7*  HCT 31.8* 28.7*  WBC 12.7* 9.4  PLT 236 220     Recent Labs  05/08/14 0518 05/09/14 0424  NA 134* 135*  K 4.7 4.2  BUN 9 8  CREATININE 0.71 0.77  GLUCOSE 138* 95     Assessment/Plan: 2 Days Post-Op Procedure(s) (LRB): LEFT TOTAL KNEE ARTHROPLASTY (Left) Up with therapy Discharge home with home health Follow up in 2 weeks at [facility]. Follow up with OLIN,Bernardo Brayman D in 2 weeks.  Contact information:  [facility] [address] [place] [phone]    Expected ABLA  Treated with iron and will observe  Obese (BMI 30-39.9) Estimated body mass index is 37.67 kg/(m^2) as calculated from the following:   Height as of this encounter: 5\' 2"  (1.575 m).   Weight as of this encounter: 93.441 kg (206 lb). Patient also counseled that weight may inhibit the healing process Patient counseled that losing weight will help with future health issues      Christina [name]   PAC  05/09/2014, 9:30 AM

## 2014-05-17 NOTE — Discharge Summary (Signed)
Physician Discharge Summary  Patient ID: GIOVANNINA MUN MRN: [id number] DOB/AGE: 1950-10-25 63 y.o.  Admit date: 05/07/2014 Discharge date: 05/09/2014   Procedures:  Procedure(s) (LRB): LEFT TOTAL KNEE ARTHROPLASTY (Left)  Attending Physician:  Dr. Paralee Cancel   Admission Diagnoses:   Left knee OA / pain  Discharge Diagnoses:  Principal Problem:   S/P left TKA Active Problems:   Postoperative anemia due to acute blood loss   Obese  Past Medical History  Diagnosis Date  . Hypertension     hx of 10 years ago   . Asthma     hx of   . GERD (gastroesophageal reflux disease)   . H/O hiatal hernia   . Arthritis   . Colitis     mild     HPI: Christina [name], 63 y.o. female, has a history of pain and functional disability in the left knee due to arthritis and has failed non-surgical conservative treatments for greater than 12 weeks to include NSAID's and/or analgesics, corticosteriod injections, use of assistive devices and activity modification. Onset of symptoms was gradual, starting >10 years ago with gradually worsening course since that time. The patient noted no past surgery on the left knee(s). Patient currently rates pain in the left knee(s) at 10 out of 10 with activity. Patient has worsening of pain with activity and weight bearing, pain that interferes with activities of daily living, pain with passive range of motion, crepitus and joint swelling. Patient has evidence of periarticular osteophytes and joint space narrowing by imaging studies. There is no active infection. Risks, benefits and expectations were discussed with the patient. Risks including but not limited to the risk of anesthesia, blood clots, nerve damage, blood vessel damage, failure of the prosthesis, infection and up to and including death. Patient understand the risks, benefits and expectations and wishes to proceed with surgery.  PCP: Mateo Flow, MD   Discharged Condition: good  Hospital Course:  Patient  underwent the above stated procedure on 05/07/2014. Patient tolerated the procedure well and brought to the recovery room in good condition and subsequently to the floor.  POD #1 BP: 117/76 ; Pulse: 67 ; Temp: 98.1 F (36.7 C) Patient reports pain as mild. Tolerating post op course well. Anxious about recovery process. Dorsiflexion/plantar flexion intact, incision: dressing C/D/I, no cellulitis present and compartment soft.   LABS  Basename    HGB  10.8  HCT  31.8   POD #2  BP: 117/75 ; Pulse: 64 ; Temp: 98 F (36.7 C) ; Resp: 16  Patient reports pain as mild, pain controlled. Small increase of pain during the night, otherwise no events throughout the night. Ready to be discharged home. Dorsiflexion/plantar flexion intact, incision: dressing C/D/I, no cellulitis present and compartment soft.   LABS  Basename    HGB  9.7  HCT  28.7    Discharge Exam: General appearance: alert, cooperative and no distress Extremities: Homans sign is negative, no sign of DVT, no edema, redness or tenderness in the calves or thighs and no ulcers, gangrene or trophic changes  Disposition: Home with follow up in 2 weeks   Follow-up Information   Follow up with Mauri Pole, MD. Schedule an appointment as soon as possible for a visit in 2 weeks.   Specialty:  Orthopedic Surgery   Contact information:   [address] [place] [address] [phone]       Discharge Instructions   Call MD / Call 911  Complete by:  As directed   If you experience chest pain or shortness of breath, CALL 911 and be transported to the hospital emergency room.  If you develope a fever above 101 F, pus (white drainage) or increased drainage or redness at the wound, or calf pain, call your surgeon's office.     Change dressing    Complete by:  As directed   Maintain surgical dressing for 10-14 days, or until follow up in the clinic.     Constipation Prevention    Complete by:  As directed    Drink plenty of fluids.  Prune juice may be helpful.  You may use a stool softener, such as Colace (over the counter) 100 mg twice a day.  Use MiraLax (over the counter) for constipation as needed.     Diet - low sodium heart healthy    Complete by:  As directed      Discharge instructions    Complete by:  As directed   Maintain surgical dressing for 10-14 days, or until follow up in the clinic. Follow up in 2 weeks at [facility]. Call with any questions or concerns.     Increase activity slowly as tolerated    Complete by:  As directed      TED hose    Complete by:  As directed   Use stockings (TED hose) for 2 weeks on both leg(s).  You may remove them at night for sleeping.     Weight bearing as tolerated    Complete by:  As directed   Laterality:  left  Extremity:  Lower             Medication List    STOP taking these medications       acetaminophen 650 MG CR tablet  Commonly known as:  TYLENOL     meloxicam 15 MG tablet  Commonly known as:  MOBIC      TAKE these medications       ACZONE EX  Apply topically. Patient uses daily in am     aspirin 325 MG EC tablet  Take 1 tablet (325 mg total) by mouth 2 (two) times daily.     chlorpheniramine 4 MG tablet  Commonly known as:  CHLOR-TRIMETON  Take 4 mg by mouth 2 (two) times daily as needed for allergies.     DSS 100 MG Caps  Take 100 mg by mouth 2 (two) times daily.     ferrous sulfate 325 (65 FE) MG tablet  Take 1 tablet (325 mg total) by mouth 3 (three) times daily after meals.     FLAX SEEDS PO  Take by mouth.     HYDROcodone-acetaminophen 7.5-325 MG per tablet  Commonly known as:  NORCO  Take 1-2 tablets by mouth every 4 (four) hours as needed for moderate pain.     methocarbamol 500 MG tablet  Commonly known as:  ROBAXIN  Take 1 tablet (500 mg total) by mouth every 6 (six) hours as needed for muscle spasms.     phenylephrine 10 MG Tabs tablet  Commonly known as:  SUDAFED PE  Take 10 mg  by mouth every 4 (four) hours as needed.     polyethylene glycol packet  Commonly known as:  MIRALAX / GLYCOLAX  Take 17 g by mouth 2 (two) times daily.     RED WINE EXTRACT PO  Take 1 capsule by mouth daily.     sulfamethoxazole-trimethoprim 800-160 MG per tablet  Commonly  known as:  BACTRIM DS  Take 1 tablet by mouth 2 (two) times daily.     tretinoin 0.1 % cream  Commonly known as:  RETIN-A  Apply topically at bedtime.          Signed: [place] P[place]. Donica Derouin   PA-C  05/17/2014, 1:45 PM

## 2014-05-24 ENCOUNTER — Encounter (HOSPITAL_COMMUNITY): Payer: Self-pay | Admitting: Pharmacy Technician

## 2014-05-31 NOTE — Progress Notes (Signed)
To [employer], PA  - Please enter preop orders in epic for Christina [name] - she is coming to [facility] tomorrow for her preop visit.  Thanks

## 2014-06-01 ENCOUNTER — Encounter (HOSPITAL_COMMUNITY): Payer: Self-pay

## 2014-06-01 ENCOUNTER — Encounter (HOSPITAL_COMMUNITY)
Admission: RE | Admit: 2014-06-01 | Discharge: 2014-06-01 | Disposition: A | Discharge status: Home / Self Care | Payer: BC Managed Care – PPO | Source: Ambulatory Visit | Attending: Orthopedic Surgery | Admitting: Orthopedic Surgery

## 2014-06-01 ENCOUNTER — Encounter (INDEPENDENT_AMBULATORY_CARE_PROVIDER_SITE_OTHER): Payer: Self-pay

## 2014-06-01 DIAGNOSIS — M1710 Unilateral primary osteoarthritis, unspecified knee: Secondary | ICD-10-CM | POA: Insufficient documentation

## 2014-06-01 DIAGNOSIS — M24569 Contracture, unspecified knee: Secondary | ICD-10-CM | POA: Insufficient documentation

## 2014-06-01 DIAGNOSIS — Z96659 Presence of unspecified artificial knee joint: Secondary | ICD-10-CM | POA: Insufficient documentation

## 2014-06-01 DIAGNOSIS — M25669 Stiffness of unspecified knee, not elsewhere classified: Secondary | ICD-10-CM | POA: Diagnosis present

## 2014-06-01 DIAGNOSIS — Z01812 Encounter for preprocedural laboratory examination: Secondary | ICD-10-CM | POA: Diagnosis present

## 2014-06-01 LAB — URINALYSIS, ROUTINE W REFLEX MICROSCOPIC
GLUCOSE, UA: NEGATIVE mg/dL
Hgb urine dipstick: NEGATIVE
KETONES UR: NEGATIVE mg/dL
Leukocytes, UA: NEGATIVE
Nitrite: NEGATIVE
PROTEIN: NEGATIVE mg/dL
Specific Gravity, Urine: 1.025 (ref 1.005–1.030)
Urobilinogen, UA: 0.2 mg/dL (ref 0.0–1.0)
pH: 5.5 (ref 5.0–8.0)

## 2014-06-01 LAB — CBC
HCT: 37.3 % (ref 36.0–46.0)
Hemoglobin: 12.2 g/dL (ref 12.0–15.0)
MCH: 30.3 pg (ref 26.0–34.0)
MCHC: 32.7 g/dL (ref 30.0–36.0)
MCV: 92.6 fL (ref 78.0–100.0)
Platelets: 362 10*3/uL (ref 150–400)
RBC: 4.03 MIL/uL (ref 3.87–5.11)
RDW: 14.4 % (ref 11.5–15.5)
WBC: 4.2 10*3/uL (ref 4.0–10.5)

## 2014-06-01 LAB — SURGICAL PCR SCREEN
MRSA, PCR: NEGATIVE
STAPHYLOCOCCUS AUREUS: NEGATIVE

## 2014-06-01 LAB — BASIC METABOLIC PANEL
Anion gap: 13 (ref 5–15)
BUN: 10 mg/dL (ref 6–23)
CO2: 24 mEq/L (ref 19–32)
CREATININE: 0.82 mg/dL (ref 0.50–1.10)
Calcium: 9.5 mg/dL (ref 8.4–10.5)
Chloride: 100 mEq/L (ref 96–112)
GFR, EST AFRICAN AMERICAN: 88 mL/min — AB (ref >90)
GFR, EST NON AFRICAN AMERICAN: 76 mL/min — AB (ref >90)
Glucose, Bld: 92 mg/dL (ref 70–99)
POTASSIUM: 4.5 meq/L (ref 3.7–5.3)
Sodium: 137 mEq/L (ref 137–147)

## 2014-06-01 LAB — PROTIME-INR
INR: 1 (ref 0.00–1.49)
Prothrombin Time: 13.2 seconds (ref 11.6–15.2)

## 2014-06-01 LAB — APTT: aPTT: 29 seconds (ref 24–37)

## 2014-06-01 NOTE — Patient Instructions (Addendum)
YOUR SURGERY IS SCHEDULED AT [facility]  ON:  Tuesday  9/15  REPORT TO  SHORT STAY CENTER AT:  6:00 AM   PLEASE COME IN THE [place] ENTRANCE AND FOLLOW SIGNS TO SHORT STAY CENTER.  DO NOT EAT OR DRINK ANYTHING AFTER MIDNIGHT THE NIGHT BEFORE YOUR SURGERY.  YOU MAY BRUSH YOUR TEETH, RINSE OUT YOUR MOUTH--BUT NO WATER, NO FOOD, NO CHEWING GUM, NO MINTS, NO CANDIES, NO CHEWING TOBACCO.  PLEASE TAKE THE FOLLOWING MEDICATIONS THE AM OF YOUR SURGERY WITH A FEW SIPS OF WATER:  PREVACID.  OXYCODONE IF NEEDED FOR PAIN   DO NOT BRING VALUABLES, MONEY, CREDIT CARDS.  DO NOT WEAR JEWELRY, MAKE-UP, NAIL POLISH AND NO METAL PINS OR CLIPS IN YOUR HAIR. CONTACT LENS, DENTURES / PARTIALS, GLASSES SHOULD NOT BE WORN TO SURGERY AND IN MOST CASES-HEARING AIDS WILL NEED TO BE REMOVED.  BRING YOUR GLASSES CASE, ANY EQUIPMENT NEEDED FOR YOUR CONTACT LENS. FOR PATIENTS ADMITTED TO THE HOSPITAL--CHECK OUT TIME THE DAY OF DISCHARGE IS 11:00 AM.  ALL INPATIENT ROOMS ARE PRIVATE - WITH BATHROOM, TELEPHONE, TELEVISION AND WIFI INTERNET.  PLEASE BE AWARE THAT YOU MAY NEED ADDITIONAL BLOOD DRAWN DAY OF YOUR SURGERY  _______________________________________________________________________   [facility] - Preparing for Surgery Before surgery, you can play an important role.  Because skin is not sterile, your skin needs to be as free of germs as possible.  You can reduce the number of germs on your skin by washing with CHG (chlorahexidine gluconate) soap before surgery.  CHG is an antiseptic cleaner which kills germs and bonds with the skin to continue killing germs even after washing. Please DO NOT use if you have an allergy to CHG or antibacterial soaps.  If your skin becomes reddened/irritated stop using the CHG and inform your nurse when you arrive at Short Stay. Do not shave (including legs and underarms) for at least 48 hours prior to the first CHG shower.  You may shave your  face/neck. Please follow these instructions carefully:  1.  Shower with CHG Soap the night before surgery and the  morning of Surgery.  2.  If you choose to wash your hair, wash your hair first as usual with your  normal  shampoo.  3.  After you shampoo, rinse your hair and body thoroughly to remove the  shampoo.                           4.  Use CHG as you would any other liquid soap.  You can apply chg directly  to the skin and wash                       Gently with a scrungie or clean washcloth.  5.  Apply the CHG Soap to your body ONLY FROM THE NECK DOWN.   Do not use on face/ open                           Wound or open sores. Avoid contact with eyes, ears mouth and genitals (private parts).                       Wash face,  Genitals (private parts) with your normal soap.             6.  Wash thoroughly, paying special attention to the  area where your surgery  will be performed.  7.  Thoroughly rinse your body with warm water from the neck down.  8.  DO NOT shower/wash with your normal soap after using and rinsing off  the CHG Soap.                9.  Pat yourself dry with a clean towel.            10.  Wear clean pajamas.            11.  Place clean sheets on your bed the night of your first shower and do not  sleep with pets. Day of Surgery : Do not apply any lotions/deodorants the morning of surgery.  Please wear clean clothes to the hospital/surgery center.  FAILURE TO FOLLOW THESE INSTRUCTIONS MAY RESULT IN THE CANCELLATION OF YOUR SURGERY PATIENT SIGNATURE_________________________________  NURSE SIGNATURE__________________________________  ________________________________________________________________________   Adam Phenix  An incentive spirometer is a tool that can help keep your lungs clear and active. This tool measures how well you are filling your lungs with each breath. Taking long deep breaths may help reverse or decrease the chance of developing breathing  (pulmonary) problems (especially infection) following:  A long period of time when you are unable to move or be active. BEFORE THE PROCEDURE   If the spirometer includes an indicator to show your best effort, your nurse or respiratory therapist will set it to a desired goal.  If possible, sit up straight or lean slightly forward. Try not to slouch.  Hold the incentive spirometer in an upright position. INSTRUCTIONS FOR USE  1. Sit on the edge of your bed if possible, or sit up as far as you can in bed or on a chair. 2. Hold the incentive spirometer in an upright position. 3. Breathe out normally. 4. Place the mouthpiece in your mouth and seal your lips tightly around it. 5. Breathe in slowly and as deeply as possible, raising the piston or the ball toward the top of the column. 6. Hold your breath for 3-5 seconds or for as long as possible. Allow the piston or ball to fall to the bottom of the column. 7. Remove the mouthpiece from your mouth and breathe out normally. 8. Rest for a few seconds and repeat Steps 1 through 7 at least 10 times every 1-2 hours when you are awake. Take your time and take a few normal breaths between deep breaths. 9. The spirometer may include an indicator to show your best effort. Use the indicator as a goal to work toward during each repetition. 10. After each set of 10 deep breaths, practice coughing to be sure your lungs are clear. If you have an incision (the cut made at the time of surgery), support your incision when coughing by placing a pillow or rolled up towels firmly against it. Once you are able to get out of bed, walk around indoors and cough well. You may stop using the incentive spirometer when instructed by your caregiver.  RISKS AND COMPLICATIONS  Take your time so you do not get dizzy or light-headed.  If you are in pain, you may need to take or ask for pain medication before doing incentive spirometry. It is harder to take a deep breath if you  are having pain. AFTER USE  Rest and breathe slowly and easily.  It can be helpful to keep track of a log of your progress. Your caregiver can provide you with a  simple table to help with this. If you are using the spirometer at home, follow these instructions: [place] IF:   You are having difficultly using the spirometer.  You have trouble using the spirometer as often as instructed.  Your pain medication is not giving enough relief while using the spirometer.  You develop fever of 100.5 F (38.1 C) or higher. SEEK IMMEDIATE MEDICAL CARE IF:   You cough up bloody sputum that had not been present before.  You develop fever of 102 F (38.9 C) or greater.  You develop worsening pain at or near the incision site. MAKE SURE YOU:   Understand these instructions.  Will watch your condition.  Will get help right away if you are not doing well or get worse. Document Released: 01/18/2007 Document Revised: 11/30/2011 Document Reviewed: 03/21/2007 Exit[employer] Patient Information 2014 Exit[employer], [place].   ________________________________________________________________________  WHAT IS A BLOOD TRANSFUSION? Blood Transfusion Information  A transfusion is the replacement of blood or some of its parts. Blood is made up of multiple cells which provide different functions.  Red blood cells carry oxygen and are used for blood loss replacement.  White blood cells fight against infection.  Platelets control bleeding.  Plasma helps clot blood.  Other blood products are available for specialized needs, such as hemophilia or other clotting disorders. BEFORE THE TRANSFUSION  Who gives blood for transfusions?   Healthy volunteers who are fully evaluated to make sure their blood is safe. This is blood bank blood. Transfusion therapy is the safest it has ever been in the practice of medicine. Before blood is taken from a donor, a complete history is taken to make sure that person has  no history of diseases nor engages in risky social behavior (examples are intravenous drug use or sexual activity with multiple partners). The donor's travel history is screened to minimize risk of transmitting infections, such as malaria. The donated blood is tested for signs of infectious diseases, such as HIV and hepatitis. The blood is then tested to be sure it is compatible with you in order to minimize the chance of a transfusion reaction. If you or a relative donates blood, this is often done in anticipation of surgery and is not appropriate for emergency situations. It takes many days to process the donated blood. RISKS AND COMPLICATIONS Although transfusion therapy is very safe and saves many lives, the main dangers of transfusion include:   Getting an infectious disease.  Developing a transfusion reaction. This is an allergic reaction to something in the blood you were given. Every precaution is taken to prevent this. The decision to have a blood transfusion has been considered carefully by your caregiver before blood is given. Blood is not given unless the benefits outweigh the risks. AFTER THE TRANSFUSION  Right after receiving a blood transfusion, you will usually feel much better and more energetic. This is especially true if your red blood cells have gotten low (anemic). The transfusion raises the level of the red blood cells which carry oxygen, and this usually causes an energy increase.  The nurse administering the transfusion will monitor you carefully for complications. HOME CARE INSTRUCTIONS  No special instructions are needed after a transfusion. You may find your energy is better. Speak with your caregiver about any limitations on activity for underlying diseases you may have. SEEK MEDICAL CARE IF:   Your condition is not improving after your transfusion.  You develop redness or irritation at the intravenous (IV) site. [place]  CARE IF:  Any of the following  symptoms occur over the next 12 hours:  Shaking chills.  You have a temperature by mouth above 102 F (38.9 C), not controlled by medicine.  Chest, back, or muscle pain.  People around you feel you are not acting correctly or are confused.  Shortness of breath or difficulty breathing.  Dizziness and fainting.  You get a rash or develop hives.  You have a decrease in urine output.  Your urine turns a dark color or changes to pink, red, or brown. Any of the following symptoms occur over the next 10 days:  You have a temperature by mouth above 102 F (38.9 C), not controlled by medicine.  Shortness of breath.  Weakness after normal activity.  The white part of the eye turns yellow (jaundice).  You have a decrease in the amount of urine or are urinating less often.  Your urine turns a dark color or changes to pink, red, or brown. Document Released: 09/04/2000 Document Revised: 11/30/2011 Document Reviewed: 04/23/2008 [facility] Patient Information 2014 [place], [place].  _______________________________________________________________________

## 2014-06-01 NOTE — Pre-Procedure Instructions (Signed)
CXR  04-30-14 AND EKG 05-07-14 REPORTS ARE IN EPIC.

## 2014-06-02 NOTE — H&P (Signed)
TOTAL KNEE ADMISSION H&P  Patient is being admitted for right total knee arthroplasty.  Subjective:  Chief Complaint:      Right knee OA / pain.  HPI: Christina [name], 63 y.o. female, has a history of pain and functional disability in the right knee due to arthritis and has failed non-surgical conservative treatments for greater than 12 weeks to include NSAID's and/or analgesics, corticosteriod injections, use of assistive devices and activity modification.  Onset of symptoms was gradual, starting >10 years ago with gradually worsening course since that time. The patient noted prior procedures on the knee to include  arthroplasty on the left knee(s).  Patient currently rates pain in the right knee(s) at 10 out of 10 with activity. Patient has worsening of pain with activity and weight bearing, pain that interferes with activities of daily living, pain with passive range of motion, crepitus and joint swelling.  Patient has evidence of periarticular osteophytes and joint space narrowing by imaging studies. There is no active infection.  Risks, benefits and expectations were discussed with the patient.  Risks including but not limited to the risk of anesthesia, blood clots, nerve damage, blood vessel damage, failure of the prosthesis, infection and up to and including death.  Patient understand the risks, benefits and expectations and wishes to proceed with surgery.   PCP: Mateo Flow, MD  D/C Plans:      Home with HHPT  Post-op Meds:       No Rx given  Tranexamic Acid:      To be given - IV    Decadron:      Is to be given  FYI:     ASA post-op  Oxycodone post-op   Patient Active Problem List   Diagnosis Date Noted  . Postoperative anemia due to acute blood loss 05/09/2014  . Obese 05/09/2014  . S/P left TKA 05/07/2014  . GLAUCOMA 08/26/2010  . ASTHMA, UNSPECIFIED 08/26/2010  . COLITIS, ACUTE 08/26/2010  . OSTEOARTHRITIS 08/26/2010  . HIATAL HERNIA, HX OF 08/26/2010  .  HEMORRHOIDS-INTERNAL 08/20/2010  . RECTAL BLEEDING 08/20/2010   Past Medical History  Diagnosis Date  . GERD (gastroesophageal reflux disease)   . H/O hiatal hernia   . Colitis     mild   . Asthma     hx of - PT STATES MILD - NO LONGER REQUIRES INHALERS  . Arthritis     PAIN AND OA RIGHT KNEE; S/P LEFT TOTAL KNEE REPLACEMENT 05-07-14 - STILL IN PHYSICAL THERAPY  . Hypertension     hx of 10 years ago ; PT'S BLOOD PRESSURE RECENTLY ELEVATED WHILE IN HOSP FOR SURG- NOT ON ANY B/P MEDS    Past Surgical History  Procedure Laterality Date  . Total knee arthroplasty Left 05/07/2014    Procedure: LEFT TOTAL KNEE ARTHROPLASTY;  Surgeon: Mauri Pole, MD;  Location: [facility];  Service: Orthopedics;  Laterality: Left;  . Wisdom teeth extractions      No prescriptions prior to admission   Allergies  Allergen Reactions  . Hctz [Hydrochlorothiazide] Other (See Comments)    Blood pressure way up then way down   . Hydrocodone-Acetaminophen Nausea And Vomiting    History  Substance Use Topics  . Smoking status: Never Smoker   . Smokeless tobacco: Never Used  . Alcohol Use: No       Review of Systems  Constitutional: Negative.   HENT: Negative.   Eyes: Negative.   Respiratory: Negative.   Cardiovascular: Negative.   Gastrointestinal:  Positive for heartburn.  Genitourinary: Negative.   Musculoskeletal: Positive for joint pain.  Skin: Negative.   Neurological: Negative.   Endo/Heme/Allergies: Negative.   Psychiatric/Behavioral: Negative.     Objective:  Physical Exam  Constitutional: She is oriented to person, place, and time. She appears well-developed and well-nourished.  HENT:  Head: Normocephalic and atraumatic.  Mouth/Throat: Oropharynx is clear and moist.  Eyes: Pupils are equal, round, and reactive to light.  Neck: Neck supple. No JVD present. No tracheal deviation present. No thyromegaly present.  Cardiovascular: Normal rate, regular rhythm, normal heart sounds and  intact distal pulses.   Respiratory: Effort normal and breath sounds normal. No stridor. No respiratory distress. She has no wheezes.  GI: Soft. There is no tenderness. There is no guarding.  Musculoskeletal:       Right knee: She exhibits decreased range of motion, swelling and bony tenderness. She exhibits no ecchymosis, no deformity, no laceration and no erythema. Tenderness found.  Lymphadenopathy:    She has no cervical adenopathy.  Neurological: She is alert and oriented to person, place, and time.  Skin: Skin is warm and dry.  Psychiatric: She has a normal mood and affect.     Labs:  Estimated body mass index is 38.94 kg/(m^2) as calculated from the following:   Height as of 09/01/10: 5\' 1"  (1.549 m).   Weight as of 05/07/14: 93.441 kg (206 lb).   Imaging Review Plain radiographs demonstrate severe degenerative joint disease of the right knee(s). The overall alignment isneutral. The bone quality appears to be good for age and reported activity level.  Assessment/Plan:  End stage arthritis, right knee   The patient history, physical examination, clinical judgment of the provider and imaging studies are consistent with end stage degenerative joint disease of the right knee(s) and total knee arthroplasty is deemed medically necessary. The treatment options including medical management, injection therapy arthroscopy and arthroplasty were discussed at length. The risks and benefits of total knee arthroplasty were presented and reviewed. The risks due to aseptic loosening, infection, stiffness, patella tracking problems, thromboembolic complications and other imponderables were discussed. The patient acknowledged the explanation, agreed to proceed with the plan and consent was signed. Patient is being admitted for inpatient treatment for surgery, pain control, PT, OT, prophylactic antibiotics, VTE prophylaxis, progressive ambulation and ADL's and discharge planning. The patient is planning  to be discharged home with home health services.    West Pugh Nain Rudd   PA-C  06/02/2014, 2:33 PM

## 2014-06-04 NOTE — Anesthesia Preprocedure Evaluation (Addendum)
Anesthesia Evaluation  Patient identified by MRN, date of birth, ID band Patient awake    Reviewed: Allergy & Precautions, H&P , NPO status , Patient's Chart, lab work & pertinent test results  Airway Mallampati: II TM Distance: >3 FB Neck ROM: Full    Dental no notable dental hx. (+) Caps,    Pulmonary asthma ,  breath sounds clear to auscultation  Pulmonary exam normal       Cardiovascular hypertension, Rhythm:Regular Rate:Normal     Neuro/Psych negative neurological ROS  negative psych ROS   GI/Hepatic Neg liver ROS, hiatal hernia, GERD-  Medicated,  Endo/Other  negative endocrine ROS  Renal/GU negative Renal ROS     Musculoskeletal  (+) Arthritis -, Osteoarthritis,    Abdominal   Peds  Hematology negative hematology ROS (+) anemia ,   Anesthesia Other Findings   Reproductive/Obstetrics negative OB ROS                           Anesthesia Physical  Anesthesia Plan  ASA: II  Anesthesia Plan: Spinal   Post-op Pain Management:    Induction:   Airway Management Planned: Simple Face Mask  Additional Equipment:   Intra-op Plan:   Post-operative Plan:   Informed Consent: I have reviewed the patients History and Physical, chart, labs and discussed the procedure including the risks, benefits and alternatives for the proposed anesthesia with the patient or authorized representative who has indicated his/her understanding and acceptance.   Dental advisory given  Plan Discussed with: CRNA  Anesthesia Plan Comments:         Anesthesia Quick Evaluation

## 2014-06-05 ENCOUNTER — Encounter (HOSPITAL_COMMUNITY): Payer: BC Managed Care – PPO | Admitting: Anesthesiology

## 2014-06-05 ENCOUNTER — Encounter (HOSPITAL_COMMUNITY)
Admission: RE | Disposition: A | Discharge status: Home Health | Payer: Self-pay | Source: Ambulatory Visit | Attending: Orthopedic Surgery

## 2014-06-05 ENCOUNTER — Inpatient Hospital Stay (HOSPITAL_COMMUNITY): Payer: BC Managed Care – PPO | Admitting: Anesthesiology

## 2014-06-05 ENCOUNTER — Inpatient Hospital Stay (HOSPITAL_COMMUNITY)
Admission: RE | Admit: 2014-06-05 | Discharge: 2014-06-07 | DRG: 470 | Disposition: A | Discharge status: Home Health | Payer: BC Managed Care – PPO | Source: Ambulatory Visit | Attending: Orthopedic Surgery | Admitting: Orthopedic Surgery

## 2014-06-05 ENCOUNTER — Encounter (HOSPITAL_COMMUNITY): Payer: Self-pay | Admitting: *Deleted

## 2014-06-05 DIAGNOSIS — Z96653 Presence of artificial knee joint, bilateral: Secondary | ICD-10-CM

## 2014-06-05 DIAGNOSIS — K219 Gastro-esophageal reflux disease without esophagitis: Secondary | ICD-10-CM | POA: Diagnosis present

## 2014-06-05 DIAGNOSIS — M1710 Unilateral primary osteoarthritis, unspecified knee: Secondary | ICD-10-CM | POA: Diagnosis present

## 2014-06-05 DIAGNOSIS — Z01812 Encounter for preprocedural laboratory examination: Secondary | ICD-10-CM

## 2014-06-05 DIAGNOSIS — Z96659 Presence of unspecified artificial knee joint: Secondary | ICD-10-CM | POA: Diagnosis not present

## 2014-06-05 DIAGNOSIS — J45909 Unspecified asthma, uncomplicated: Secondary | ICD-10-CM | POA: Diagnosis present

## 2014-06-05 DIAGNOSIS — M25569 Pain in unspecified knee: Secondary | ICD-10-CM | POA: Diagnosis present

## 2014-06-05 DIAGNOSIS — D62 Acute posthemorrhagic anemia: Secondary | ICD-10-CM | POA: Diagnosis not present

## 2014-06-05 DIAGNOSIS — M2569 Stiffness of other specified joint, not elsewhere classified: Secondary | ICD-10-CM | POA: Diagnosis present

## 2014-06-05 DIAGNOSIS — E669 Obesity, unspecified: Secondary | ICD-10-CM | POA: Diagnosis present

## 2014-06-05 DIAGNOSIS — H409 Unspecified glaucoma: Secondary | ICD-10-CM | POA: Diagnosis present

## 2014-06-05 DIAGNOSIS — Z6838 Body mass index (BMI) 38.0-38.9, adult: Secondary | ICD-10-CM | POA: Diagnosis not present

## 2014-06-05 DIAGNOSIS — I10 Essential (primary) hypertension: Secondary | ICD-10-CM | POA: Diagnosis present

## 2014-06-05 DIAGNOSIS — M2560 Stiffness of unspecified joint, not elsewhere classified: Secondary | ICD-10-CM

## 2014-06-05 DIAGNOSIS — M6580 Other synovitis and tenosynovitis, unspecified site: Secondary | ICD-10-CM | POA: Diagnosis present

## 2014-06-05 HISTORY — PX: KNEE CLOSED REDUCTION: SHX995

## 2014-06-05 HISTORY — PX: TOTAL KNEE ARTHROPLASTY: SHX125

## 2014-06-05 LAB — TYPE AND SCREEN
ABO/RH(D): B POS
Antibody Screen: NEGATIVE

## 2014-06-05 SURGERY — ARTHROPLASTY, KNEE, TOTAL
Anesthesia: Spinal | Site: Knee | Laterality: Right

## 2014-06-05 MED ORDER — SODIUM CHLORIDE 0.9 % IV SOLN
INTRAVENOUS | Status: DC
  Administered 2014-06-05: 17:00:00 via INTRAVENOUS
  Filled 2014-06-05 (×7): qty 1000

## 2014-06-05 MED ORDER — METHOCARBAMOL 1000 MG/10ML IJ SOLN
500.0000 mg | Freq: Four times a day (QID) | INTRAVENOUS | Status: DC | PRN
  Administered 2014-06-05: 500 mg via INTRAVENOUS
  Filled 2014-06-05: qty 5

## 2014-06-05 MED ORDER — KETOROLAC TROMETHAMINE 30 MG/ML IJ SOLN
INTRAMUSCULAR | Status: DC | PRN
  Administered 2014-06-05: 30 mg

## 2014-06-05 MED ORDER — LIDOCAINE HCL (CARDIAC) 20 MG/ML IV SOLN
INTRAVENOUS | Status: AC
  Filled 2014-06-05: qty 5

## 2014-06-05 MED ORDER — BUPIVACAINE IN DEXTROSE 0.75-8.25 % IT SOLN
INTRATHECAL | Status: DC | PRN
  Administered 2014-06-05: 1.8 mL via INTRATHECAL

## 2014-06-05 MED ORDER — SODIUM CHLORIDE 0.9 % IR SOLN
Status: DC | PRN
  Administered 2014-06-05: 1000 mL

## 2014-06-05 MED ORDER — ONDANSETRON HCL 4 MG PO TABS
4.0000 mg | ORAL_TABLET | Freq: Four times a day (QID) | ORAL | Status: DC | PRN
Start: 2014-06-05 — End: 2014-06-07

## 2014-06-05 MED ORDER — FERROUS SULFATE 325 (65 FE) MG PO TABS
325.0000 mg | ORAL_TABLET | Freq: Three times a day (TID) | ORAL | Status: DC
  Administered 2014-06-05 – 2014-06-07 (×5): 325 mg via ORAL
  Filled 2014-06-05 (×8): qty 1

## 2014-06-05 MED ORDER — CEFAZOLIN SODIUM-DEXTROSE 2-3 GM-% IV SOLR
2.0000 g | Freq: Four times a day (QID) | INTRAVENOUS | Status: AC
  Administered 2014-06-05 (×2): 2 g via INTRAVENOUS
  Filled 2014-06-05 (×2): qty 50

## 2014-06-05 MED ORDER — MIDAZOLAM HCL 5 MG/5ML IJ SOLN
INTRAMUSCULAR | Status: DC | PRN
  Administered 2014-06-05: 2 mg via INTRAVENOUS

## 2014-06-05 MED ORDER — CEFAZOLIN SODIUM-DEXTROSE 2-3 GM-% IV SOLR
2.0000 g | INTRAVENOUS | Status: AC
  Administered 2014-06-05: 2 g via INTRAVENOUS

## 2014-06-05 MED ORDER — HYDROMORPHONE HCL PF 1 MG/ML IJ SOLN
0.5000 mg | INTRAMUSCULAR | Status: DC | PRN
  Administered 2014-06-05 (×2): 0.5 mg via INTRAVENOUS
  Administered 2014-06-05: 1 mg via INTRAVENOUS
  Filled 2014-06-05 (×3): qty 1

## 2014-06-05 MED ORDER — METOCLOPRAMIDE HCL 10 MG PO TABS: 5.0000 mg | ORAL_TABLET | Freq: Three times a day (TID) | ORAL | Status: DC | PRN

## 2014-06-05 MED ORDER — DIPHENHYDRAMINE HCL 25 MG PO CAPS: 25.0000 mg | ORAL_CAPSULE | Freq: Four times a day (QID) | ORAL | Status: DC | PRN

## 2014-06-05 MED ORDER — DEXAMETHASONE SODIUM PHOSPHATE 10 MG/ML IJ SOLN
10.0000 mg | Freq: Once | INTRAMUSCULAR | Status: DC
  Filled 2014-06-05: qty 1

## 2014-06-05 MED ORDER — FENTANYL CITRATE 0.05 MG/ML IJ SOLN
INTRAMUSCULAR | Status: AC
  Filled 2014-06-05: qty 2

## 2014-06-05 MED ORDER — PROPOFOL 10 MG/ML IV BOLUS
INTRAVENOUS | Status: DC | PRN
  Administered 2014-06-05: 10 mg via INTRAVENOUS
  Administered 2014-06-05: 20 mg via INTRAVENOUS

## 2014-06-05 MED ORDER — METHOCARBAMOL 500 MG PO TABS
500.0000 mg | ORAL_TABLET | Freq: Four times a day (QID) | ORAL | Status: DC | PRN
  Administered 2014-06-05 – 2014-06-07 (×4): 500 mg via ORAL
  Filled 2014-06-05 (×5): qty 1

## 2014-06-05 MED ORDER — PROPOFOL INFUSION 10 MG/ML OPTIME
INTRAVENOUS | Status: DC | PRN
  Administered 2014-06-05: 100 ug/kg/min via INTRAVENOUS

## 2014-06-05 MED ORDER — PROPOFOL 10 MG/ML IV BOLUS
INTRAVENOUS | Status: AC
  Filled 2014-06-05: qty 20

## 2014-06-05 MED ORDER — BUPIVACAINE-EPINEPHRINE (PF) 0.25% -1:200000 IJ SOLN
INTRAMUSCULAR | Status: DC | PRN
  Administered 2014-06-05: 30 mL

## 2014-06-05 MED ORDER — TRANEXAMIC ACID 100 MG/ML IV SOLN
1000.0000 mg | Freq: Once | INTRAVENOUS | Status: AC
  Administered 2014-06-05: 1000 mg via INTRAVENOUS
  Filled 2014-06-05: qty 10

## 2014-06-05 MED ORDER — PROMETHAZINE HCL 25 MG PO TABS
12.5000 mg | ORAL_TABLET | Freq: Four times a day (QID) | ORAL | Status: DC | PRN
  Administered 2014-06-07: 12.5 mg via ORAL
  Filled 2014-06-05: qty 1

## 2014-06-05 MED ORDER — FENTANYL CITRATE 0.05 MG/ML IJ SOLN
INTRAMUSCULAR | Status: DC | PRN
  Administered 2014-06-05 (×2): 50 ug via INTRAVENOUS

## 2014-06-05 MED ORDER — ALUM & MAG HYDROXIDE-SIMETH 200-200-20 MG/5ML PO SUSP: 30.0000 mL | ORAL | Status: DC | PRN

## 2014-06-05 MED ORDER — ONDANSETRON HCL 4 MG/2ML IJ SOLN
INTRAMUSCULAR | Status: DC | PRN
  Administered 2014-06-05: 4 mg via INTRAVENOUS

## 2014-06-05 MED ORDER — KETOROLAC TROMETHAMINE 30 MG/ML IJ SOLN
INTRAMUSCULAR | Status: AC
  Filled 2014-06-05: qty 1

## 2014-06-05 MED ORDER — LACTATED RINGERS IV SOLN
INTRAVENOUS | Status: DC | PRN
  Administered 2014-06-05 (×2): via INTRAVENOUS

## 2014-06-05 MED ORDER — CELECOXIB 200 MG PO CAPS
200.0000 mg | ORAL_CAPSULE | Freq: Two times a day (BID) | ORAL | Status: DC
  Administered 2014-06-05 – 2014-06-07 (×4): 200 mg via ORAL
  Filled 2014-06-05 (×5): qty 1

## 2014-06-05 MED ORDER — TRETINOIN 0.1 % EX CREA: TOPICAL_CREAM | Freq: Every day | CUTANEOUS | Status: DC

## 2014-06-05 MED ORDER — BUPIVACAINE LIPOSOME 1.3 % IJ SUSP
INTRAMUSCULAR | Status: DC | PRN
  Administered 2014-06-05: 20 mL

## 2014-06-05 MED ORDER — DEXAMETHASONE SODIUM PHOSPHATE 10 MG/ML IJ SOLN
10.0000 mg | Freq: Once | INTRAMUSCULAR | Status: AC
  Administered 2014-06-05: 10 mg via INTRAVENOUS

## 2014-06-05 MED ORDER — STERILE WATER FOR IRRIGATION IR SOLN
Status: DC | PRN
  Administered 2014-06-05: 1500 mL

## 2014-06-05 MED ORDER — CHLORHEXIDINE GLUCONATE 4 % EX LIQD: 60.0000 mL | Freq: Once | CUTANEOUS | Status: DC

## 2014-06-05 MED ORDER — HYDROMORPHONE HCL PF 1 MG/ML IJ SOLN: 0.2500 mg | INTRAMUSCULAR | Status: DC | PRN

## 2014-06-05 MED ORDER — ONDANSETRON HCL 4 MG/2ML IJ SOLN: 4.0000 mg | Freq: Four times a day (QID) | INTRAMUSCULAR | Status: DC | PRN

## 2014-06-05 MED ORDER — BUPIVACAINE-EPINEPHRINE (PF) 0.25% -1:200000 IJ SOLN
INTRAMUSCULAR | Status: AC
  Filled 2014-06-05: qty 30

## 2014-06-05 MED ORDER — LIDOCAINE HCL (CARDIAC) 20 MG/ML IV SOLN
INTRAVENOUS | Status: DC | PRN
  Administered 2014-06-05: 20 mg via INTRAVENOUS

## 2014-06-05 MED ORDER — ONDANSETRON HCL 4 MG/2ML IJ SOLN
INTRAMUSCULAR | Status: AC
  Filled 2014-06-05: qty 2

## 2014-06-05 MED ORDER — BUPIVACAINE LIPOSOME 1.3 % IJ SUSP
20.0000 mL | Freq: Once | INTRAMUSCULAR | Status: DC
  Filled 2014-06-05: qty 20

## 2014-06-05 MED ORDER — SODIUM CHLORIDE 0.9 % IJ SOLN
INTRAMUSCULAR | Status: AC
  Filled 2014-06-05: qty 10

## 2014-06-05 MED ORDER — BISACODYL 10 MG RE SUPP: 10.0000 mg | Freq: Every day | RECTAL | Status: DC | PRN

## 2014-06-05 MED ORDER — LACTATED RINGERS IV SOLN: INTRAVENOUS | Status: DC

## 2014-06-05 MED ORDER — 0.9 % SODIUM CHLORIDE (POUR BTL) OPTIME
TOPICAL | Status: DC | PRN
  Administered 2014-06-05: 1000 mL

## 2014-06-05 MED ORDER — DAPSONE 5 % EX GEL: 1.0000 "application " | Freq: Every day | CUTANEOUS | Status: DC

## 2014-06-05 MED ORDER — PANTOPRAZOLE SODIUM 20 MG PO TBEC
20.0000 mg | DELAYED_RELEASE_TABLET | Freq: Every day | ORAL | Status: DC
  Administered 2014-06-06 – 2014-06-07 (×2): 20 mg via ORAL
  Filled 2014-06-05 (×2): qty 1

## 2014-06-05 MED ORDER — MENTHOL 3 MG MT LOZG: 1.0000 | LOZENGE | OROMUCOSAL | Status: DC | PRN

## 2014-06-05 MED ORDER — MAGNESIUM CITRATE PO SOLN: 1.0000 | Freq: Once | ORAL | Status: AC | PRN

## 2014-06-05 MED ORDER — MIDAZOLAM HCL 2 MG/2ML IJ SOLN
INTRAMUSCULAR | Status: AC
  Filled 2014-06-05: qty 2

## 2014-06-05 MED ORDER — DOCUSATE SODIUM 100 MG PO CAPS
100.0000 mg | ORAL_CAPSULE | Freq: Two times a day (BID) | ORAL | Status: DC
  Administered 2014-06-05 – 2014-06-06 (×3): 100 mg via ORAL

## 2014-06-05 MED ORDER — ASPIRIN EC 325 MG PO TBEC
325.0000 mg | DELAYED_RELEASE_TABLET | Freq: Two times a day (BID) | ORAL | Status: DC
  Administered 2014-06-06 – 2014-06-07 (×3): 325 mg via ORAL
  Filled 2014-06-05 (×5): qty 1

## 2014-06-05 MED ORDER — OXYCODONE HCL 5 MG PO TABS
5.0000 mg | ORAL_TABLET | ORAL | Status: DC
  Administered 2014-06-05: 10 mg via ORAL
  Administered 2014-06-05: 5 mg via ORAL
  Administered 2014-06-05 – 2014-06-07 (×10): 15 mg via ORAL
  Filled 2014-06-05 (×2): qty 3
  Filled 2014-06-05: qty 2
  Filled 2014-06-05 (×3): qty 3
  Filled 2014-06-05: qty 1
  Filled 2014-06-05 (×2): qty 3
  Filled 2014-06-05: qty 2
  Filled 2014-06-05: qty 1
  Filled 2014-06-05 (×2): qty 3

## 2014-06-05 MED ORDER — MEPERIDINE HCL 50 MG/ML IJ SOLN
INTRAMUSCULAR | Status: AC
  Filled 2014-06-05: qty 1

## 2014-06-05 MED ORDER — SULFAMETHOXAZOLE-TMP DS 800-160 MG PO TABS
1.0000 | ORAL_TABLET | Freq: Two times a day (BID) | ORAL | Status: DC
  Administered 2014-06-06 (×2): 1 via ORAL
  Filled 2014-06-05 (×5): qty 1

## 2014-06-05 MED ORDER — PROMETHAZINE HCL 25 MG/ML IJ SOLN: 6.2500 mg | INTRAMUSCULAR | Status: DC | PRN

## 2014-06-05 MED ORDER — POLYETHYLENE GLYCOL 3350 17 G PO PACK
17.0000 g | PACK | Freq: Two times a day (BID) | ORAL | Status: DC
  Administered 2014-06-06 (×2): 17 g via ORAL

## 2014-06-05 MED ORDER — MEPERIDINE HCL 50 MG/ML IJ SOLN
6.2500 mg | INTRAMUSCULAR | Status: DC | PRN
  Administered 2014-06-05 (×2): 6.25 mg via INTRAVENOUS

## 2014-06-05 MED ORDER — METOCLOPRAMIDE HCL 5 MG/ML IJ SOLN: 5.0000 mg | Freq: Three times a day (TID) | INTRAMUSCULAR | Status: DC | PRN

## 2014-06-05 MED ORDER — SODIUM CHLORIDE 0.9 % IJ SOLN
INTRAMUSCULAR | Status: DC | PRN
  Administered 2014-06-05: 9 mL

## 2014-06-05 MED ORDER — PHENOL 1.4 % MT LIQD: 1.0000 | OROMUCOSAL | Status: DC | PRN

## 2014-06-05 MED ORDER — DEXAMETHASONE SODIUM PHOSPHATE 10 MG/ML IJ SOLN
INTRAMUSCULAR | Status: AC
  Filled 2014-06-05: qty 1

## 2014-06-05 MED ORDER — CEFAZOLIN SODIUM-DEXTROSE 2-3 GM-% IV SOLR
INTRAVENOUS | Status: AC
  Filled 2014-06-05: qty 50

## 2014-06-05 SURGICAL SUPPLY — 53 items
ADH SKN CLS APL DERMABOND .7 (GAUZE/BANDAGES/DRESSINGS) ×2
BAG SPEC THK2 15X12 ZIP CLS (MISCELLANEOUS) ×2
BAG ZIPLOCK 12X15 (MISCELLANEOUS) ×3 IMPLANT
BANDAGE ELASTIC 6 VELCRO ST LF (GAUZE/BANDAGES/DRESSINGS) ×4 IMPLANT
BANDAGE ESMARK 6X9 LF (GAUZE/BANDAGES/DRESSINGS) ×2 IMPLANT
BLADE SAW SGTL 13.0X1.19X90.0M (BLADE) ×4 IMPLANT
BNDG CMPR 9X6 STRL LF SNTH (GAUZE/BANDAGES/DRESSINGS) ×2
BNDG ESMARK 6X9 LF (GAUZE/BANDAGES/DRESSINGS) ×4
BOWL SMART MIX CTS (DISPOSABLE) ×4 IMPLANT
CAP KNEE ATTUNE RP ×3 IMPLANT
CEMENT HV SMART SET (Cement) ×4 IMPLANT
CUFF TOURN SGL QUICK 34 (TOURNIQUET CUFF) ×4
CUFF TRNQT CYL 34X4X40X1 (TOURNIQUET CUFF) ×2 IMPLANT
DERMABOND ADVANCED (GAUZE/BANDAGES/DRESSINGS) ×2
DERMABOND ADVANCED .7 DNX12 (GAUZE/BANDAGES/DRESSINGS) ×2 IMPLANT
DRAPE EXTREMITY TIBURON (DRAPES) ×4 IMPLANT
DRAPE POUCH INSTRU U-SHP 10X18 (DRAPES) ×4 IMPLANT
DRAPE U-SHAPE 47X51 STRL (DRAPES) ×4 IMPLANT
DRSG AQUACEL AG ADV 3.5X10 (GAUZE/BANDAGES/DRESSINGS) ×4 IMPLANT
DURAPREP 26ML APPLICATOR (WOUND CARE) ×8 IMPLANT
ELECT REM PT RETURN 9FT ADLT (ELECTROSURGICAL) ×4
ELECTRODE REM PT RTRN 9FT ADLT (ELECTROSURGICAL) ×2 IMPLANT
FACESHIELD WRAPAROUND (MASK) ×20 IMPLANT
FACESHIELD WRAPAROUND OR TEAM (MASK) ×10 IMPLANT
GLOVE BIOGEL PI IND STRL 7.5 (GLOVE) ×2 IMPLANT
GLOVE BIOGEL PI IND STRL 8.5 (GLOVE) ×2 IMPLANT
GLOVE BIOGEL PI INDICATOR 7.5 (GLOVE) ×4
GLOVE BIOGEL PI INDICATOR 8.5 (GLOVE) ×2
GLOVE ECLIPSE 8.0 STRL XLNG CF (GLOVE) ×2 IMPLANT
GLOVE ORTHO TXT STRL SZ7.5 (GLOVE) ×8 IMPLANT
GOWN SPEC L3 XXLG W/TWL (GOWN DISPOSABLE) ×4 IMPLANT
GOWN STRL REUS W/TWL LRG LVL3 (GOWN DISPOSABLE) ×4 IMPLANT
HANDPIECE INTERPULSE COAX TIP (DISPOSABLE) ×4
KIT BASIN OR (CUSTOM PROCEDURE TRAY) ×4 IMPLANT
MANIFOLD NEPTUNE II (INSTRUMENTS) ×4 IMPLANT
NDL SAFETY ECLIPSE 18X1.5 (NEEDLE) ×2 IMPLANT
NEEDLE HYPO 18GX1.5 SHARP (NEEDLE) ×8
PACK TOTAL JOINT (CUSTOM PROCEDURE TRAY) ×4 IMPLANT
POSITIONER SURGICAL ARM (MISCELLANEOUS) ×4 IMPLANT
SET HNDPC FAN SPRY TIP SCT (DISPOSABLE) ×2 IMPLANT
SET PAD KNEE POSITIONER (MISCELLANEOUS) ×4 IMPLANT
SUCTION FRAZIER 12FR DISP (SUCTIONS) ×4 IMPLANT
SUT MNCRL AB 4-0 PS2 18 (SUTURE) ×4 IMPLANT
SUT VIC AB 1 CT1 36 (SUTURE) ×4 IMPLANT
SUT VIC AB 2-0 CT1 27 (SUTURE) ×12
SUT VIC AB 2-0 CT1 TAPERPNT 27 (SUTURE) ×6 IMPLANT
SUT VLOC 180 0 24IN GS25 (SUTURE) ×4 IMPLANT
SYR 50ML LL SCALE MARK (SYRINGE) ×4 IMPLANT
TOWEL OR 17X26 10 PK STRL BLUE (TOWEL DISPOSABLE) ×4 IMPLANT
TOWEL OR NON WOVEN STRL DISP B (DISPOSABLE) ×3 IMPLANT
TRAY FOLEY CATH 14FRSI W/METER (CATHETERS) ×4 IMPLANT
WATER STERILE IRR 1500ML POUR (IV SOLUTION) ×4 IMPLANT
WRAP KNEE MAXI GEL POST OP (GAUZE/BANDAGES/DRESSINGS) ×4 IMPLANT

## 2014-06-05 NOTE — Progress Notes (Signed)
Demerol 6.25 mg IVP given for shaking and shivering 

## 2014-06-05 NOTE — Anesthesia Procedure Notes (Signed)
Spinal  Patient location during procedure: OR Start time: 06/05/2014 8:26 AM End time: 06/05/2014 8:31 AM Staffing Anesthesiologist: Nickie Retort CRNA/Resident: Lajuana Carry E Performed by: resident/CRNA  Preanesthetic Checklist Completed: patient identified, site marked, surgical consent, pre-op evaluation, timeout performed, IV checked, risks and benefits discussed and monitors and equipment checked Spinal Block Patient position: sitting Prep: Betadine Patient monitoring: heart rate, continuous pulse ox and blood pressure Approach: midline Location: L3-4 Injection technique: single-shot Needle Needle type: Sprotte  Needle gauge: 24 G Needle length: 10 cm Additional Notes Kit expiration checked, Time out performed.Sitting position, sterile prep and drape, lido skin wheal @ L3-4, spinal needle to same w/ clear CSF first attempt. Neg heme, neg paresthesia. Clear CSF pre/post injection. Pt. tol well, return to supine.

## 2014-06-05 NOTE — Progress Notes (Signed)
Demerol 6.25mg  IVP repeated for shaking and shivering.

## 2014-06-05 NOTE — Transfer of Care (Signed)
Immediate Anesthesia Transfer of Care Note  Patient: Christina [name]  Procedure(s) Performed: Procedure(s) (LRB): RIGHT TOTAL KNEE ARTHROPLASTY (Right) CLOSED MANIPULATION KNEE (Left)  Patient Location: PACU  Anesthesia Type: Spinal  Level of Consciousness: sedated, patient cooperative and responds to stimulation  Airway & Oxygen Therapy: Patient Spontanous Breathing and Patient connected to face mask oxgen  Post-op Assessment: Report given to PACU RN and Post -op Vital signs reviewed and stable  Post vital signs: Reviewed and stable  Complications: No apparent anesthesia complications

## 2014-06-05 NOTE — Op Note (Signed)
Christina [name], KOHLMAN NO.:  [id number]  MEDICAL RECORD NO.:  [id number]  LOCATION:  9767                         FACILITY:  [facility]  PHYSICIAN:  Pietro Cassis. Alvan Dame, M.D.  DATE OF BIRTH:  [birth year]  DATE OF PROCEDURE:  06/05/2014 DATE OF DISCHARGE:                              OPERATIVE REPORT   PREOPERATIVE DIAGNOSES: 1. Status post left total knee arthroplasty with postoperative pain,     stiffness consistent with arthrofibrosis. 2. Right knee osteoarthritis.  POSTOPERATIVE DIAGNOSES: 1. Status post left total knee arthroplasty with postoperative pain,     stiffness consistent with arthrofibrosis. 2. Right knee osteoarthritis.  PROCEDURE:  This dictated operative note is for the first procedure which is the manipulation of her left total knee arthroplasty.  Please note that her right total knee arthroplasty is in the system by templated total knee arthroplasty template.  SURGEON:  Pietro Cassis. Alvan Dame, M.D.  ASSISTANT:  None for the manipulation.  Nehemiah Massed, PA-C for the right total knee arthroplasty as documented otherwise.  ANESTHESIA:  Spinal.  SPECIMENS:  None.  COMPLICATIONS:  None.  TOURNIQUET:  Not utilized for this manipulation.  INDICATION FOR PROCEDURE:  [name] is a 63 year old female who had presented to the office for evaluation of severe bilateral knee osteoarthritis with severe flexion and extension contractures.  She had undergone a left total knee arthroplasty in August of this year.  She had limited efforts at physical therapy due to some denials to insurance claims.  She had been working hard on her own.  As was discussed with her and her perioperative visits that we would plan to manipulate her to try to maximize her recovery over left knee given the significant contractures preoperatively.  After reviewing risks and benefits, the risks of persistent stiffness based on her preoperative range of motion, any musculoskeletal  complications, consent was obtained in addition to the right total knee arthroplasty for maximal functional improvement.  PROCEDURE IN DETAIL:  The patient was brought to the operative theater. Once adequate anesthesia, preoperative antibiotics, Ancef had been administered for the total knee procedure.  She was positioned supine. The right lower extremity was prepared for the right total knee.  A time- out was performed identifying the patient, planned procedure, this left knee manipulation.  Once this was carried out, I examined her knee under anesthesia found that she lacked just 5 degrees of extension passively and was flexing to about 90 degrees.  This represents a very good range of motion for her.  I was able to passively stretch her out straight and lysing some adhesions posteriorly and then I was able to flex her back to 110 degrees, lysing adhesions anteriorly.  I was satisfied with her range of motion particularly as compared to what her preoperative state was and her current right total knee replacement.  At the conclusion of this case, we began the procedure on the right knee again documented in separate operative note.     Pietro Cassis Alvan Dame, M.D.     MDO/MEDQ  D:  06/05/2014  T:  06/05/2014  Job:  [id number]

## 2014-06-05 NOTE — Evaluation (Signed)
Physical Therapy Evaluation Patient Details Name: Christina [name] MRN: [id number] DOB: Jan 03, 1951 Today's Date: 06/05/2014   History of Present Illness  R TKA  Clinical Impression  Did ambulate 8 feet. Dizzy and nausea. Plans Dc to home > Pty will benefit from PT to address problems listed in chart.   Follow Up Recommendations Home health PT;Supervision/Assistance - 24 hour    Equipment Recommendations  None recommended by PT    Recommendations for Other Services       Precautions / Restrictions Precautions Precautions: Knee Precaution Comments: jusyt had L 1 month ago      Mobility  Bed Mobility Overal bed mobility: Needs Assistance Bed Mobility: Supine to Sit;Sit to Supine     Supine to sit: Min assist Sit to supine: Min assist   General bed mobility comments: self helps with hands  Transfers Overall transfer level: Needs assistance Equipment used: Rolling walker (2 wheeled) Transfers: Sit to/from [facility] Sit to Stand: Min assist;From elevated surface Stand pivot transfers: Min assist       General transfer comment: cuesnfor hand palcement,   Ambulation/Gait Ambulation/Gait assistance: Min assist Ambulation Distance (Feet): 8 Feet Assistive device: Rolling walker (2 wheeled) Gait Pattern/deviations: Step-to pattern;Decreased step length - right;Decreased step length - left     General Gait Details: limited by nausea and dizziness.  Stairs            Wheelchair Mobility    Modified Rankin (Stroke Patients Only)       Balance                                             Pertinent Vitals/Pain Pain Assessment: 0-10 Pain Score: 4  Pain Location: R knee Pain Descriptors / Indicators: Aching Pain Intervention(s): Premedicated before session;Ice applied    Home Living Family/patient expects to be discharged to:: Private residence Living Arrangements: Other relatives Available Help at Discharge:  Family Type of Home: House Home Access: Stairs to enter   Technical brewer of Steps: 2 Home Layout: One level Home Equipment: Shower seat Additional Comments: high rise toilet. has a grab bar that can attach onto tub to help stand at toilet. vanity on the other side.    Prior Function Level of Independence: Independent with assistive device(s)               Hand Dominance        Extremity/Trunk Assessment   Upper Extremity Assessment: Overall WFL for tasks assessed           Lower Extremity Assessment: RLE deficits/detail;LLE deficits/detail RLE Deficits / Details: 10-60 LLE Deficits / Details: 5-60     Communication   Communication: No difficulties  Cognition Arousal/Alertness: Awake/alert Behavior During Therapy: WFL for tasks assessed/performed Overall Cognitive Status: Within Functional Limits for tasks assessed                      General Comments      Exercises        Assessment/Plan    PT Assessment Patient needs continued PT services  PT Diagnosis Abnormality of gait;Acute pain   PT Problem List Decreased strength;Decreased range of motion;Decreased activity tolerance;Decreased safety awareness;Decreased knowledge of precautions;Decreased knowledge of use of DME;Pain  PT Treatment Interventions DME instruction;Gait training;Stair training;Functional mobility training;Therapeutic activities;Therapeutic exercise;Patient/family education   PT Goals (Current goals can be found  in the Care Plan section) Acute Rehab PT Goals Patient Stated Goal: to walk straight PT Goal Formulation: With patient Time For Goal Achievement: 06/08/14 Potential to Achieve Goals: Good    Frequency 7X/week   Barriers to discharge        Co-evaluation               End of Session   Activity Tolerance: Patient limited by fatigue Patient left: in bed;with call bell/phone within reach Nurse Communication: Mobility status         Time:  1840-1905 PT Time Calculation (min): 25 min   Charges:   PT Evaluation $Initial PT Evaluation Tier I: 1 Procedure PT Treatments $Gait Training: 23-37 mins   PT G Codes:          Claretha Cooper 06/05/2014, 7:12 PM

## 2014-06-05 NOTE — Interval H&P Note (Signed)
History and Physical Interval Note:  06/05/2014 6:54 AM  Christina [name]  has presented today for surgery, with the diagnosis of OA RIGHT KNEE  The various methods of treatment have been discussed with the patient and family. After consideration of risks, benefits and other options for treatment, the patient has consented to  Procedure(s): RIGHT TOTAL KNEE ARTHROPLASTY (Right) and manipulation under anesthesia of her LEFT knee status post total knee arthroplasty as a surgical intervention .  The patient's history has been reviewed, patient examined, no change in status, stable for surgery.  I have reviewed the patient's chart and labs.  Questions were answered to the patient's satisfaction.     Mauri Pole

## 2014-06-05 NOTE — Progress Notes (Signed)
Shaking and shivering stopped. 

## 2014-06-05 NOTE — Op Note (Signed)
NAME:  Christina [name]                      MEDICAL RECORD NO.:  [id number]                             FACILITY:  [facility]      PHYSICIAN:  Pietro Cassis. Alvan Dame, M.D.  DATE OF BIRTH:  08/30/1951      DATE OF PROCEDURE:  06/05/2014                                     OPERATIVE REPORT         PREOPERATIVE DIAGNOSIS:  Right knee osteoarthritis.      POSTOPERATIVE DIAGNOSIS:  Right knee osteoarthritis.      FINDINGS:  The patient was noted to have complete loss of cartilage and   bone-on-bone arthritis with associated osteophytes in all three compartments of   the knee with a significant synovitis and associated effusion and flexion and extension contracures      PROCEDURE:  Right total knee replacement.      COMPONENTS USED:  DePuy Attune rotating platform posterior stabilized knee   system, a size 4 femur, 4 tibia, size 7 mm AOX PS insert, and 35 anatomic patellar   button.      SURGEON:  Pietro Cassis. Alvan Dame, M.D.      ASSISTANT:  Nehemiah Massed, PA-C.      ANESTHESIA:  Spinal.      SPECIMENS:  None.      COMPLICATION:  None.      DRAINS:  None.  EBL: <100cc      TOURNIQUET TIME:   Total Tourniquet Time Documented: Thigh (Right) - 34 minutes Total: Thigh (Right) - 34 minutes  .      The patient was stable to the recovery room.      INDICATION FOR PROCEDURE:  Christina [name] is a 63 y.o. female patient of   mine.  The patient had been seen, evaluated, and treated conservatively in the   office with medication, activity modification, and injections.  The patient had   radiographic changes of bone-on-bone arthritis with endplate sclerosis and osteophytes noted.      The patient failed conservative measures including medication, injections, and activity modification, and at this point was ready for more definitive measures.   Based on the radiographic changes and failed conservative measures, the patient   decided to proceed with total knee replacement.  Risks of infection,   DVT,  component failure, need for revision surgery, postop course, and   expectations were all   discussed and reviewed.  Consent was obtained for benefit of pain   relief.      PROCEDURE IN DETAIL:  The patient was brought to the operative theater.   Once adequate anesthesia, preoperative antibiotics, 2 gm of Ancef administered, the patient was positioned supine with the right thigh tourniquet placed.  The  righ lower extremity was prepped and draped in sterile fashion after completion of the left knee manipulation procedure.  A time-   out was performed identifying the patient, planned procedure, and   extremity.      The left lower extremity was placed in the [facility] leg holder.  The leg was   exsanguinated, tourniquet elevated to 250 mmHg.  A midline incision was  made followed by median parapatellar arthrotomy.  Following initial   exposure, attention was first directed to the patella.  Precut   measurement was noted to be 22-23 mm.  I resected down to 13-14 mm and used a   35 anatomic patellar button to restore patellar height as well as cover the cut   surface.      The lug holes were drilled and a metal shim was placed to protect the   patella from retractors and saw blades.      At this point, attention was now directed to the femur.  The femoral   canal was opened with a drill, irrigated to try to prevent fat emboli.  An   intramedullary rod was passed at 3 degrees valgus, 10 mm of bone was   resected off the distal femur due to significant flexion contracture.  Following this resection, the tibia was   subluxated anteriorly.  Using the extramedullary guide, 4 mm of bone was resected off   the proximal medial tibia.  We confirmed the gap would be   stable medially and laterally with a size 6 insert as well as confirmed   the cut was perpendicular in the coronal plane, checking with an alignment rod.      Once this was done, I sized the femur to be a size 4 in the anterior-    posterior dimension, chose a standard component based on medial and   lateral dimension.  The size 4 rotation block was then pinned in   position anterior referenced using the tensioning device to set rotation and also balance extension and flexion gaps.  The   anterior, posterior, and  chamfer cuts were made without difficulty nor   notching making certain that I was along the anterior cortex to help   with flexion gap stability.      The final box cut was made off the lateral aspect of distal femur.      At this point, the tibia was sized to be a size 4, the size 4 tray was   then pinned in position through the medial third of the tubercle,   drilled, and keel punched.  Trial reduction was now carried with a 4 femur,  4 tibia, a size 6 then size 7 mm insert, and the 35 patella botton.  The knee was brought to   extension, full extension with good flexion stability with the patella   tracking through the trochlea without application of pressure.  Given   all these findings, the trial components removed.  Final components were   opened and cement was mixed.  The knee was irrigated with normal saline   solution and pulse lavage.  The synovial lining was   then injected with 20cc of Exparel, 30cc of 0.25% Marcaine with epinephrine and 1 cc of Toradol,   total of 61 cc.      The knee was irrigated.  Final implants were then cemented onto clean and   dried cut surfaces of bone with the knee brought to extension with a 7 mm trial insert.      Once the cement had fully cured, the excess cement was removed   throughout the knee.  I confirmed I was satisfied with the range of   motion and stability, and the final 7 mm AOX insert was chosen.  It was   placed into the knee.      The tourniquet had been let down at 34 minutes.  No significant   hemostasis required.  The   extensor mechanism was then reapproximated using #1 Vicryl and #0 V-lock sutures with the knee   in flexion.  The    remaining wound was closed with 2-0 Vicryl and running 4-0 Monocryl.   The knee was cleaned, dried, dressed sterilely using Dermabond and   Aquacel dressing.  The patient was then   brought to recovery room in stable condition, tolerating the procedure   well.   Please note that Physician Assistant, Nehemiah Massed, PA-C, was present for the entirety of the case, and was utilized for pre-operative positioning, peri-operative retractor management, general facilitation of the procedure.  He was also utilized for primary wound closure at the end of the case.              Pietro Cassis Alvan Dame, M.D.    06/05/2014 10:05 AM

## 2014-06-05 NOTE — Brief Op Note (Signed)
06/05/2014  9:58 AM  PATIENT:  Christina [name]  63 y.o. female  PRE-OPERATIVE DIAGNOSIS:  1. 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  Right knee osteoarthritis with significant contractures  POST-OPERATIVE DIAGNOSIS:  1. 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  Right knee osteoarthritis with significant contractures   PROCEDURE:  Procedure(s): 1. CLOSED MANIPULATION KNEE (Left) 2. RIGHT TOTAL KNEE ARTHROPLASTY (Right)   SURGEON:  Surgeon(s) and Role:    * Mauri Pole, MD - Primary  PHYSICIAN ASSISTANT: None for (1) manipulation, Nehemiah Massed, PA-C for (2) right TKR  ANESTHESIA:   spinal  EBL:  Total I/O In: 1000 [I.V.:1000] Out: -   BLOOD ADMINISTERED:none  DRAINS: none   LOCAL MEDICATIONS USED:  NONE  SPECIMEN:  No Specimen  DISPOSITION OF SPECIMEN:  N/A  COUNTS:  NO closed porcedure, manipulation  TOURNIQUET:   Not used for manipulation  DICTATION: .Other Dictation: Dictation Number [id number]  PLAN OF CARE: Admit to inpatient   PATIENT DISPOSITION:  PACU - hemodynamically stable.   Delay start of Pharmacological VTE agent (>24hrs) due to surgical blood loss or risk of bleeding: no

## 2014-06-06 LAB — CBC
HCT: 31.3 % — ABNORMAL LOW (ref 36.0–46.0)
Hemoglobin: 10.1 g/dL — ABNORMAL LOW (ref 12.0–15.0)
MCH: 30 pg (ref 26.0–34.0)
MCHC: 32.3 g/dL (ref 30.0–36.0)
MCV: 92.9 fL (ref 78.0–100.0)
PLATELETS: 291 10*3/uL (ref 150–400)
RBC: 3.37 MIL/uL — AB (ref 3.87–5.11)
RDW: 13.8 % (ref 11.5–15.5)
WBC: 13.7 10*3/uL — ABNORMAL HIGH (ref 4.0–10.5)

## 2014-06-06 LAB — BASIC METABOLIC PANEL
ANION GAP: 13 (ref 5–15)
BUN: 11 mg/dL (ref 6–23)
CALCIUM: 8.9 mg/dL (ref 8.4–10.5)
CO2: 23 meq/L (ref 19–32)
CREATININE: 0.67 mg/dL (ref 0.50–1.10)
Chloride: 101 mEq/L (ref 96–112)
GFR calc Af Amer: >90 mL/min (ref >90)
GFR calc non Af Amer: >90 mL/min (ref >90)
Glucose, Bld: 130 mg/dL — ABNORMAL HIGH (ref 70–99)
Potassium: 4.1 mEq/L (ref 3.7–5.3)
Sodium: 137 mEq/L (ref 137–147)

## 2014-06-06 MED ORDER — ASPIRIN 325 MG PO TBEC: 325.0000 mg | DELAYED_RELEASE_TABLET | Freq: Two times a day (BID) | ORAL | Status: AC

## 2014-06-06 MED ORDER — FERROUS SULFATE 325 (65 FE) MG PO TABS: 325.0000 mg | ORAL_TABLET | Freq: Three times a day (TID) | ORAL | Status: DC

## 2014-06-06 MED ORDER — OXYCODONE HCL 5 MG PO TABS: 5.0000 mg | ORAL_TABLET | ORAL | Status: DC | PRN

## 2014-06-06 MED ORDER — TIZANIDINE HCL 4 MG PO TABS: 4.0000 mg | ORAL_TABLET | Freq: Four times a day (QID) | ORAL | Status: DC | PRN

## 2014-06-06 MED ORDER — POLYETHYLENE GLYCOL 3350 17 G PO PACK: 17.0000 g | PACK | Freq: Two times a day (BID) | ORAL | Status: DC

## 2014-06-06 NOTE — Progress Notes (Signed)
Physical Therapy Treatment Patient Details Name: Christina [name] MRN: [id number] DOB: [birth year] Today's Date: 06/06/2014    History of Present Illness R TKA    PT Comments    Patient mobilizing well. really working on knee ROM.  Follow Up Recommendations  Home health PT;Supervision/Assistance - 24 hour     Equipment Recommendations  None recommended by PT    Recommendations for Other Services       Precautions / Restrictions Precautions Precautions: Knee Precaution Comments: just had L 1 month ago    Mobility  Bed Mobility   Bed Mobility: Supine to Sit;Sit to Supine     Supine to sit: Supervision Sit to supine: Supervision   General bed mobility comments: self helps with hands  Transfers Overall transfer level: Needs assistance Equipment used: Rolling walker (2 wheeled) Transfers: Sit to/from Stand Sit to Stand: Supervision         General transfer comment: cues for hand placement   Ambulation/Gait Ambulation/Gait assistance: Min guard Ambulation Distance (Feet): 50 Feet Assistive device: Rolling walker (2 wheeled) Gait Pattern/deviations: Step-to pattern;Decreased step length - right;Decreased step length - left;Antalgic     General Gait Details: tolerated well,  mild dizziness.    Stairs            Wheelchair Mobility    Modified Rankin (Stroke Patients Only)       Balance                                    Cognition Arousal/Alertness: Awake/alert                          Exercises Total Joint Exercises Ankle Circles/Pumps: AROM;Both;10 reps;Supine Quad Sets: AROM;Both;10 reps;Supine Short Arc Quad: AAROM;Both;10 reps;Supine Heel Slides: AAROM;Both;10 reps;Supine Hip ABduction/ADduction: AAROM;Both;10 reps;Supine Straight Leg Raises: AAROM;Both;10 reps;Supine Goniometric ROM: L-10-70, R-10-50- supine    General Comments        Pertinent Vitals/Pain Pain Score: 5  Pain Location: both walking Pain  Descriptors / Indicators: Aching;Sharp;Tightness Pain Intervention(s): Premedicated before session;Ice applied    Home Living                      Prior Function            PT Goals (current goals can now be found in the care plan section) Progress towards PT goals: Progressing toward goals    Frequency  7X/week    PT Plan Current plan remains appropriate    Co-evaluation             End of Session   Activity Tolerance: Patient tolerated treatment well Patient left: in bed;in CPM;with call bell/phone within reach     Time: 1022-1105 PT Time Calculation (min): 43 min  Charges:  $Gait Training: 23-37 mins $Therapeutic Exercise: 38-52 mins $Self Care/Home Management: 8-22                    G Codes:      Claretha Cooper 06/06/2014, 1:00 PM Tresa Endo PT [phone]

## 2014-06-06 NOTE — Care Management Note (Signed)
    Page 1 of 1   06/06/2014     9:11:16 AM CARE MANAGEMENT NOTE 06/06/2014  Patient:  Christina [name], Christina [name]   Account Number:  [id number]  Date Initiated:  06/06/2014  Documentation initiated by:  [facility]  Subjective/Objective Assessment:   adm: RIGHT TOTAL KNEE ARTHROPLASTY (Right)  CLOSED MANIPULATION KNEE (Left)     Action/Plan:   discharge planning   Anticipated DC Date:  06/07/2014   Anticipated DC Plan:  [place]  CM consult      [facility] Choice  HOME HEALTH   Choice offered to / List presented to:  C-1 Patient        [place] arranged  HH-2 PT      [place]   Status of service:  Completed, signed off Medicare Important Message given?   (If response is "NO", the following Medicare IM given date fields will be blank) Date Medicare IM given:   Medicare IM given by:   Date Additional Medicare IM given:   Additional Medicare IM given by:    Discharge Disposition:  [place]  Per UR Regulation:    If discussed at Long Length of Stay Meetings, dates discussed:    Comments:  06/06/14 08:00 CM met with pt in room to offer choice.  Pt chooses [facility] for HHPT. No DME needed as she has DME from sx last month.   CM notified [place] Go rep, Debbie (on unit) with referral and requested HHPT and pre-authorization for [facility].  No other CM needs were communicated.  Mariane Masters, BSN, CM [phone].

## 2014-06-06 NOTE — Progress Notes (Signed)
OT Cancellation Note  Patient Details Name: RAKAYLA RICKLEFS MRN: [id number] DOB: 1951-09-13   Cancelled Treatment:    Reason Eval/Treat Not Completed: Other (comment).  Screened for OT needs by PT; pt was here recently for other knee  Lorena Clearman 06/06/2014, 7:18 AM Lesle Chris, OTR/L [phone] 06/06/2014

## 2014-06-06 NOTE — Progress Notes (Signed)
     Subjective: 1 Day Post-Op Procedure(s) (LRB): RIGHT TOTAL KNEE ARTHROPLASTY (Right) CLOSED MANIPULATION KNEE (Left)   Patient reports pain as mild, pain controlled. No events throughout the night. Feels that the left knee is already feeling a little better than prior to surgery.  Objective:   VITALS:   Filed Vitals:   06/06/14 0600  BP: 140/65  Pulse: 93  Temp: 98.1 F (36.7 C)  Resp: 20    Dorsiflexion/Plantar flexion intact Incision: dressing C/D/I No cellulitis present Compartment soft  LABS  Recent Labs  06/06/14 0450  HGB 10.1*  HCT 31.3*  WBC 13.7*  PLT 291     Recent Labs  06/06/14 0450  NA 137  K 4.1  BUN 11  CREATININE 0.67  GLUCOSE 130*     Assessment/Plan: 1 Day Post-Op Procedure(s) (LRB): RIGHT TOTAL KNEE ARTHROPLASTY (Right) CLOSED MANIPULATION KNEE (Left) Foley cath d/c'ed Advance diet Up with therapy D/C IV fluids Discharge home with home health eventually, when ready  Expected ABLA  Treated with iron and will observe  Obese (BMI 30-39.9) Estimated body mass index is 36.57 kg/(m^2) as calculated from the following:   Height as of this encounter: 5\' 2"  (1.575 m).   Weight as of this encounter: 90.719 kg (200 lb). Patient also counseled that weight may inhibit the healing process Patient counseled that losing weight will help with future health issues      Christina [name]   PAC  06/06/2014, 8:16 AM

## 2014-06-06 NOTE — Anesthesia Postprocedure Evaluation (Signed)
Anesthesia Post Note  Patient: Christina [name]  Procedure(s) Performed: Procedure(s) (LRB): RIGHT TOTAL KNEE ARTHROPLASTY (Right) CLOSED MANIPULATION KNEE (Left)  Anesthesia type: Spinal  Patient location: PACU  Post pain: Pain level controlled  Post assessment: Post-op Vital signs reviewed  Last Vitals: BP 128/80  Pulse 89  Temp(Src) 36.5 C (Oral)  Resp 16  Ht 5\' 2"  (1.575 m)  Wt 200 lb (90.719 kg)  BMI 36.57 kg/m2  SpO2 100%  Post vital signs: Reviewed  Level of consciousness: sedated  Complications: No apparent anesthesia complications

## 2014-06-06 NOTE — Progress Notes (Signed)
Physical Therapy Treatment Patient Details Name: DELENA CASEBEER MRN: [id number] DOB: Oct 05, 1950 Today's Date: 06/06/2014    History of Present Illness R TKA    PT Comments    Encouraged patient to perform heelslides  A lot.  Follow Up Recommendations  Home health PT;Supervision/Assistance - 24 hour     Equipment Recommendations  None recommended by PT    Recommendations for Other Services       Precautions / Restrictions Precautions Precautions: Knee Precaution Comments: just had L 1 month ago    Mobility  Bed Mobility                  Transfers                    Ambulation/Gait                 Stairs            Wheelchair Mobility    Modified Rankin (Stroke Patients Only)       Balance                                    Cognition Arousal/Alertness: Awake/alert                          Exercises Total Joint Exercises Ankle Circles/Pumps: AROM;Both;10 reps;Supine Quad Sets: AROM;Both;10 reps;Supine Short Arc Quad: AAROM;Both;10 reps;Supine Heel Slides: AAROM;Both;10 reps;Supine Hip ABduction/ADduction: AAROM;Both;10 reps;Supine Straight Leg Raises: AAROM;Both;10 reps;Supine Goniometric ROM: L-10-70, R-10-50- supine    General Comments        Pertinent Vitals/Pain Pain Score: 5  Pain Location: R knee, L knee 1 Pain Descriptors / Indicators: Aching;Sharp;Tightness Pain Intervention(s): Premedicated before session;Ice applied    Home Living                      Prior Function            PT Goals (current goals can now be found in the care plan section) Progress towards PT goals: Progressing toward goals    Frequency  7X/week    PT Plan Current plan remains appropriate    Co-evaluation             End of Session   Activity Tolerance: Patient tolerated treatment well Patient left: in bed;with call bell/phone within reach     Time: 0815-0859 PT Time Calculation  (min): 44 min  Charges:  $Therapeutic Exercise: 38-52 mins                    G Codes:      Claretha Cooper 06/06/2014, 12:53 PM

## 2014-06-07 LAB — CBC
HCT: 30.7 % — ABNORMAL LOW (ref 36.0–46.0)
Hemoglobin: 9.9 g/dL — ABNORMAL LOW (ref 12.0–15.0)
MCH: 30.1 pg (ref 26.0–34.0)
MCHC: 32.2 g/dL (ref 30.0–36.0)
MCV: 93.3 fL (ref 78.0–100.0)
PLATELETS: 250 10*3/uL (ref 150–400)
RBC: 3.29 MIL/uL — ABNORMAL LOW (ref 3.87–5.11)
RDW: 14 % (ref 11.5–15.5)
WBC: 10.8 10*3/uL — AB (ref 4.0–10.5)

## 2014-06-07 LAB — BASIC METABOLIC PANEL
ANION GAP: 12 (ref 5–15)
BUN: 12 mg/dL (ref 6–23)
CALCIUM: 8.9 mg/dL (ref 8.4–10.5)
CO2: 22 mEq/L (ref 19–32)
CREATININE: 0.66 mg/dL (ref 0.50–1.10)
Chloride: 101 mEq/L (ref 96–112)
GFR calc Af Amer: >90 mL/min (ref >90)
GFR calc non Af Amer: >90 mL/min (ref >90)
Glucose, Bld: 100 mg/dL — ABNORMAL HIGH (ref 70–99)
Potassium: 4 mEq/L (ref 3.7–5.3)
Sodium: 135 mEq/L — ABNORMAL LOW (ref 137–147)

## 2014-06-07 NOTE — Progress Notes (Signed)
Patient asking for scripts celebrex and phenergan, matt babish PA paged...Bethann Punches RN

## 2014-06-07 NOTE — Progress Notes (Signed)
     Subjective: 2 Days Post-Op Procedure(s) (LRB): RIGHT TOTAL KNEE ARTHROPLASTY (Right) CLOSED MANIPULATION KNEE (Left)   Patient reports pain as mild, pain controlled. No events throughout the night. Ready to be discharged home.  Objective:   VITALS:   Filed Vitals:   06/07/14 0555  BP: 135/83  Pulse: 74  Temp: 97.8 F (36.6 C)  Resp: 16    Dorsiflexion/Plantar flexion intact Incision: dressing C/D/I No cellulitis present Compartment soft  LABS  Recent Labs  06/06/14 0450 06/07/14 0623  HGB 10.1* 9.9*  HCT 31.3* 30.7*  WBC 13.7* 10.8*  PLT 291 250     Recent Labs  06/06/14 0450 06/07/14 0535  NA 137 135*  K 4.1 4.0  BUN 11 12  CREATININE 0.67 0.66  GLUCOSE 130* 100*     Assessment/Plan: 2 Days Post-Op Procedure(s) (LRB): RIGHT TOTAL KNEE ARTHROPLASTY (Right) CLOSED MANIPULATION KNEE (Left) Up with therapy Discharge home with home health eventually, when ready Follow up in 2 weeks at [facility]. Follow up with OLIN,Cinderella Christoffersen D in 2 weeks.  Contact information:  [facility] [address] [place]   PAC  06/07/2014, 8:14 AM

## 2014-06-07 NOTE — Progress Notes (Signed)
Pt discharged home. AVS and "my chart" discussed with patient. Pt verbalizes medications, signs and symptoms of infection, follow-up appointments, and dressing changes. Contacted MD for prescription of Celebrex and Phenergan and he is sending the prescription to her pharmacy. Pt says that she will not take Iron and Maylax at home. No current report or evidence of distress. Pt educated to return to ER in cases of SOB, chest pain, or dizziness.

## 2014-06-07 NOTE — Progress Notes (Signed)
Christina [name], will call scripts into patients pharmacy, pt just discharged, called and left a message on home phone... DFranklin RN

## 2014-06-07 NOTE — Progress Notes (Signed)
Physical Therapy Treatment Patient Details Name: Christina [name] MRN: [id number] DOB: 01/29/1951 Today's Date: 06/07/2014    History of Present Illness R TKA    PT Comments    POD # 2 am session.  Found pt in bathroom by herself.  C/o dizziness and fatigue assisted her from toilet to sink to wash hands then amb back to bed.  BP taken 127/45.  Assisted back to bed to rest then applied ICE.   Follow Up Recommendations  Home health PT;Supervision/Assistance - 24 hour     Equipment Recommendations  None recommended by PT    Recommendations for Other Services       Precautions / Restrictions Precautions Precautions: Knee Precaution Comments: just had L 1 month ago Restrictions Weight Bearing Restrictions: No    Mobility  Bed Mobility Overal bed mobility: Needs Assistance Bed Mobility: Supine to Sit;Sit to Supine     Supine to sit: Min guard Sit to supine: Min guard   General bed mobility comments: increased time  Transfers Overall transfer level: Needs assistance Equipment used: Rolling walker (2 wheeled) Transfers: Sit to/from Stand Sit to Stand: Supervision;Min guard            Ambulation/Gait Ambulation/Gait assistance: Min guard;Min assist Ambulation Distance (Feet): 8 Feet Assistive device: Rolling walker (2 wheeled) Gait Pattern/deviations: Step-to pattern;Decreased stance time - right;Trunk flexed Gait velocity: decreased   General Gait Details: mild c/o dizziness   Stairs            Wheelchair Mobility    Modified Rankin (Stroke Patients Only)       Balance                                    Cognition                            Exercises      General Comments        Pertinent Vitals/Pain      Home Living                      Prior Function            PT Goals (current goals can now be found in the care plan section) Progress towards PT goals: Progressing toward goals    Frequency      PT Plan      Co-evaluation             End of Session Equipment Utilized During Treatment: Gait belt Activity Tolerance: Patient limited by fatigue;Patient limited by pain Patient left: in bed;with call bell/phone within reach     Time: 0925-0935 PT Time Calculation (min): 10 min  Charges:  $Gait Training: 8-22 mins                    G Codes:      Rica Koyanagi  PTA [facility]  Acute  Rehab Pager      [phone]

## 2014-06-07 NOTE — Progress Notes (Signed)
Physical Therapy Treatment Patient Details Name: RONIN CRAGER MRN: [id number] DOB: [birth year] Today's Date: 06/07/2014    History of Present Illness R TKA    PT Comments    POD # 2 pt plans to D/C to home today.  Assisted OOB to amb and practice going up/down one step backward with RW.  Pt just had her other knee replaced a month ago and is struggling with weakness in that knee.  Follow Up Recommendations  Home health PT;Supervision/Assistance - 24 hour     Equipment Recommendations  None recommended by PT    Recommendations for Other Services       Precautions / Restrictions Precautions Precautions: Knee Precaution Comments: just had L 1 month ago Restrictions Weight Bearing Restrictions: No    Mobility  Bed Mobility Overal bed mobility: Needs Assistance Bed Mobility: Supine to Sit;Sit to Supine     Supine to sit: Min guard Sit to supine: Min guard   General bed mobility comments: increased time  Transfers Overall transfer level: Needs assistance Equipment used: Rolling walker (2 wheeled) Transfers: Sit to/from Stand Sit to Stand: Supervision;Min guard            Ambulation/Gait Ambulation/Gait assistance: Supervision;Min guard Ambulation Distance (Feet): 20 Feet Assistive device: Rolling walker (2 wheeled) Gait Pattern/deviations: Step-to pattern;Decreased stance time - right;Trunk flexed Gait velocity: decreased   General Gait Details: mild c/o dizziness and MAX c/o fatigue    Stairs Stairs: Yes Stairs assistance: Min assist Stair Management: No rails;Step to pattern;Backwards;With walker Number of Stairs: 1 General stair comments: increased time and X 2 attempts.  Performed with great difficulty.  Wheelchair Mobility    Modified Rankin (Stroke Patients Only)       Balance                                    Cognition                            Exercises      General Comments        Pertinent  Vitals/Pain      Home Living                      Prior Function            PT Goals (current goals can now be found in the care plan section) Progress towards PT goals: Progressing toward goals    Frequency       PT Plan      Co-evaluation             End of Session Equipment Utilized During Treatment: Gait belt Activity Tolerance: Patient limited by fatigue;Patient limited by pain Patient left: in bed;with call bell/phone within reach     Time: 1142-1208 PT Time Calculation (min): 26 min  Charges:  $Gait Training: 8-22 mins $Therapeutic Activity: 8-22 mins                    G Codes:      Rica Koyanagi  PTA [facility]  Acute  Rehab Pager      [phone]

## 2014-06-11 NOTE — Discharge Summary (Signed)
Physician Discharge Summary  Patient ID: Christina [name] MRN: [id number] DOB/AGE: [birth year] 63 y.o.  Admit date: 06/05/2014 Discharge date: 06/07/2014   Procedures:  Procedure(s) (LRB): RIGHT TOTAL KNEE ARTHROPLASTY (Right) CLOSED MANIPULATION KNEE (Left)  Attending Physician:  Dr. Paralee Cancel   Admission Diagnoses:   Right knee OA / pain  Discharge Diagnoses:  Principal Problem:   S/P right TKA Active Problems:   Expected blood loss anemia   Obese   S/P knee replacement  Past Medical History  Diagnosis Date  . GERD (gastroesophageal reflux disease)   . H/O hiatal hernia   . Colitis     mild   . Asthma     hx of - PT STATES MILD - NO LONGER REQUIRES INHALERS  . Arthritis     PAIN AND OA RIGHT KNEE; S/P LEFT TOTAL KNEE REPLACEMENT 05-07-14 - STILL IN PHYSICAL THERAPY  . Hypertension     hx of 10 years ago ; PT'S BLOOD PRESSURE RECENTLY ELEVATED WHILE IN HOSP FOR SURG- NOT ON ANY B/P MEDS    HPI: Christina [name], 63 y.o. female, has a history of pain and functional disability in the right knee due to arthritis and has failed non-surgical conservative treatments for greater than 12 weeks to include NSAID's and/or analgesics, corticosteriod injections, use of assistive devices and activity modification. Onset of symptoms was gradual, starting >10 years ago with gradually worsening course since that time. The patient noted prior procedures on the knee to include arthroplasty on the left knee(s). Patient currently rates pain in the right knee(s) at 10 out of 10 with activity. Patient has worsening of pain with activity and weight bearing, pain that interferes with activities of daily living, pain with passive range of motion, crepitus and joint swelling. Patient has evidence of periarticular osteophytes and joint space narrowing by imaging studies. There is no active infection. Risks, benefits and expectations were discussed with the patient. Risks including but not limited to the risk  of anesthesia, blood clots, nerve damage, blood vessel damage, failure of the prosthesis, infection and up to and including death. Patient understand the risks, benefits and expectations and wishes to proceed with surgery.   PCP: Mateo Flow, MD   Discharged Condition: good  Hospital Course:  Patient underwent the above stated procedure on 06/05/2014. Patient tolerated the procedure well and brought to the recovery room in good condition and subsequently to the floor.  POD #1 BP: 140/65 ; Pulse: 93 ; Temp: 98.1 F (36.7 C) ; Resp: 20 Patient reports pain as mild, pain controlled. No events throughout the night. Feels that the left knee is already feeling a little better than prior to surgery. Dorsiflexion/plantar flexion intact, incision: dressing C/D/I, no cellulitis present and compartment soft.   LABS  Basename    HGB  10.1  HCT  31.3   POD #2  BP: 135/83 ; Pulse: 74 ; Temp: 97.8 F (36.6 C) ; Resp: 16  Patient reports pain as mild, pain controlled. No events throughout the night. Ready to be discharged home. Dorsiflexion/plantar flexion intact, incision: dressing C/D/I, no cellulitis present and compartment soft.   LABS  Basename    HGB  9.9  HCT  30.7    Discharge Exam: General appearance: alert, cooperative and no distress Extremities: Homans sign is negative, no sign of DVT, no edema, redness or tenderness in the calves or thighs and no ulcers, gangrene or trophic changes  Disposition: Home with follow up in 2 weeks  Follow-up Information   Follow up with [facility] (home health physical therapy)    Contact information:   [address] [place] [address] [phone]       Follow up with Mauri Pole, MD. Schedule an appointment as soon as possible for a visit in 2 weeks.   Specialty:  Orthopedic Surgery   Contact information:   [address] [place] [address] [phone]       Discharge Instructions    Call MD / Call 911    Complete by:  As directed   If you experience chest pain or shortness of breath, CALL 911 and be transported to the hospital emergency room.  If you develope a fever above 101 F, pus (white drainage) or increased drainage or redness at the wound, or calf pain, call your surgeon's office.     Change dressing    Complete by:  As directed   Maintain surgical dressing for 10-14 days, or until follow up in the clinic.     Constipation Prevention    Complete by:  As directed   Drink plenty of fluids.  Prune juice may be helpful.  You may use a stool softener, such as Colace (over the counter) 100 mg twice a day.  Use MiraLax (over the counter) for constipation as needed.     Diet - low sodium heart healthy    Complete by:  As directed      Discharge instructions    Complete by:  As directed   Maintain surgical dressing for 10-14 days, or until follow up in the clinic. Follow up in 2 weeks at [facility]. Call with any questions or concerns.     Driving restrictions    Complete by:  As directed   No driving for 4 weeks     Increase activity slowly as tolerated    Complete by:  As directed      TED hose    Complete by:  As directed   Use stockings (TED hose) for 2 weeks on both leg(s).  You may remove them at night for sleeping.     Weight bearing as tolerated    Complete by:  As directed   Laterality:  bilateral  Extremity:  Lower             Medication List    STOP taking these medications       aspirin 325 MG tablet  Replaced by:  aspirin 325 MG EC tablet      TAKE these medications       acetaminophen 650 MG CR tablet  Commonly known as:  TYLENOL  Take 1,300 mg by mouth every 8 (eight) hours as needed for pain.     ACZONE 5 % topical gel  Generic drug:  Dapsone  Apply 1 application topically daily.     aspirin 325 MG EC tablet  Take 1 tablet (325 mg total) by mouth 2 (two) times daily.     celecoxib 200 MG capsule  Commonly known as:   CELEBREX  Take 200 mg by mouth 2 (two) times daily.     docusate sodium 100 MG capsule  Commonly known as:  COLACE  Take 100 mg by mouth 2 (two) times daily as needed.     ferrous sulfate 325 (65 FE) MG tablet  Take 1 tablet (325 mg total) by mouth 3 (three) times daily after meals.     FLAX SEEDS PO  Take  1,400 mg by mouth daily.     lansoprazole 15 MG capsule  Commonly known as:  PREVACID  Take 15 mg by mouth daily. TAKES IN AM     oxyCODONE 5 MG immediate release tablet  Commonly known as:  Oxy IR/ROXICODONE  Take 1-3 tablets (5-15 mg total) by mouth every 4 (four) hours as needed for severe pain.     polyethylene glycol packet  Commonly known as:  MIRALAX / GLYCOLAX  Take 17 g by mouth 2 (two) times daily.     promethazine 12.5 MG tablet  Commonly known as:  PHENERGAN  Take 12.5 mg by mouth every 6 (six) hours as needed for nausea or vomiting.     RED WINE EXTRACT PO  Take 1 capsule by mouth daily.     sulfamethoxazole-trimethoprim 800-160 MG per tablet  Commonly known as:  BACTRIM DS  Take 1 tablet by mouth 2 (two) times daily.     tiZANidine 4 MG tablet  Commonly known as:  ZANAFLEX  Take 1 tablet (4 mg total) by mouth every 6 (six) hours as needed for muscle spasms.     tretinoin 0.1 % cream  Commonly known as:  RETIN-A  Apply topically at bedtime.         Signed: [place]. Katiana Ruland   PA-C  06/11/2014, 1:11 PM

## 2018-08-29 DIAGNOSIS — Z0000 Encounter for general adult medical examination without abnormal findings: Secondary | ICD-10-CM | POA: Diagnosis not present

## 2018-12-05 DIAGNOSIS — L700 Acne vulgaris: Secondary | ICD-10-CM | POA: Diagnosis not present

## 2019-04-14 DIAGNOSIS — L700 Acne vulgaris: Secondary | ICD-10-CM | POA: Diagnosis not present

## 2019-04-14 DIAGNOSIS — Z79899 Other long term (current) drug therapy: Secondary | ICD-10-CM | POA: Diagnosis not present

## 2019-05-30 ENCOUNTER — Emergency Department (HOSPITAL_COMMUNITY): Payer: Medicare Other

## 2019-05-30 ENCOUNTER — Inpatient Hospital Stay (HOSPITAL_COMMUNITY)
Admission: EM | Admit: 2019-05-30 | Discharge: 2019-06-04 | DRG: 286 | Disposition: A | Discharge status: Home / Self Care | Payer: Medicare Other | Attending: Internal Medicine | Admitting: Internal Medicine

## 2019-05-30 ENCOUNTER — Encounter (HOSPITAL_COMMUNITY): Payer: Self-pay | Admitting: Emergency Medicine

## 2019-05-30 DIAGNOSIS — N179 Acute kidney failure, unspecified: Secondary | ICD-10-CM

## 2019-05-30 DIAGNOSIS — E669 Obesity, unspecified: Secondary | ICD-10-CM | POA: Diagnosis present

## 2019-05-30 DIAGNOSIS — Z8249 Family history of ischemic heart disease and other diseases of the circulatory system: Secondary | ICD-10-CM

## 2019-05-30 DIAGNOSIS — J810 Acute pulmonary edema: Secondary | ICD-10-CM

## 2019-05-30 DIAGNOSIS — H409 Unspecified glaucoma: Secondary | ICD-10-CM | POA: Diagnosis present

## 2019-05-30 DIAGNOSIS — R000 Tachycardia, unspecified: Secondary | ICD-10-CM | POA: Diagnosis not present

## 2019-05-30 DIAGNOSIS — Z792 Long term (current) use of antibiotics: Secondary | ICD-10-CM

## 2019-05-30 DIAGNOSIS — K219 Gastro-esophageal reflux disease without esophagitis: Secondary | ICD-10-CM | POA: Diagnosis present

## 2019-05-30 DIAGNOSIS — Z6841 Body Mass Index (BMI) 40.0 and over, adult: Secondary | ICD-10-CM | POA: Diagnosis not present

## 2019-05-30 DIAGNOSIS — Z7951 Long term (current) use of inhaled steroids: Secondary | ICD-10-CM

## 2019-05-30 DIAGNOSIS — L709 Acne, unspecified: Secondary | ICD-10-CM | POA: Diagnosis present

## 2019-05-30 DIAGNOSIS — Z885 Allergy status to narcotic agent status: Secondary | ICD-10-CM

## 2019-05-30 DIAGNOSIS — M170 Bilateral primary osteoarthritis of knee: Secondary | ICD-10-CM | POA: Diagnosis present

## 2019-05-30 DIAGNOSIS — I110 Hypertensive heart disease with heart failure: Secondary | ICD-10-CM | POA: Diagnosis not present

## 2019-05-30 DIAGNOSIS — I5021 Acute systolic (congestive) heart failure: Secondary | ICD-10-CM | POA: Diagnosis not present

## 2019-05-30 DIAGNOSIS — I509 Heart failure, unspecified: Secondary | ICD-10-CM | POA: Diagnosis not present

## 2019-05-30 DIAGNOSIS — I248 Other forms of acute ischemic heart disease: Secondary | ICD-10-CM | POA: Diagnosis present

## 2019-05-30 DIAGNOSIS — Z8349 Family history of other endocrine, nutritional and metabolic diseases: Secondary | ICD-10-CM

## 2019-05-30 DIAGNOSIS — I9589 Other hypotension: Secondary | ICD-10-CM | POA: Diagnosis not present

## 2019-05-30 DIAGNOSIS — R0789 Other chest pain: Secondary | ICD-10-CM | POA: Diagnosis not present

## 2019-05-30 DIAGNOSIS — Z6839 Body mass index (BMI) 39.0-39.9, adult: Secondary | ICD-10-CM

## 2019-05-30 DIAGNOSIS — I214 Non-ST elevation (NSTEMI) myocardial infarction: Secondary | ICD-10-CM | POA: Diagnosis not present

## 2019-05-30 DIAGNOSIS — I420 Dilated cardiomyopathy: Secondary | ICD-10-CM | POA: Diagnosis present

## 2019-05-30 DIAGNOSIS — R0902 Hypoxemia: Secondary | ICD-10-CM | POA: Diagnosis not present

## 2019-05-30 DIAGNOSIS — E876 Hypokalemia: Secondary | ICD-10-CM | POA: Diagnosis not present

## 2019-05-30 DIAGNOSIS — Z20828 Contact with and (suspected) exposure to other viral communicable diseases: Secondary | ICD-10-CM | POA: Diagnosis present

## 2019-05-30 DIAGNOSIS — Z888 Allergy status to other drugs, medicaments and biological substances status: Secondary | ICD-10-CM

## 2019-05-30 DIAGNOSIS — Z79899 Other long term (current) drug therapy: Secondary | ICD-10-CM

## 2019-05-30 DIAGNOSIS — J9601 Acute respiratory failure with hypoxia: Secondary | ICD-10-CM

## 2019-05-30 DIAGNOSIS — J45909 Unspecified asthma, uncomplicated: Secondary | ICD-10-CM | POA: Diagnosis present

## 2019-05-30 DIAGNOSIS — I4891 Unspecified atrial fibrillation: Secondary | ICD-10-CM | POA: Diagnosis not present

## 2019-05-30 DIAGNOSIS — Z209 Contact with and (suspected) exposure to unspecified communicable disease: Secondary | ICD-10-CM | POA: Diagnosis not present

## 2019-05-30 DIAGNOSIS — I428 Other cardiomyopathies: Secondary | ICD-10-CM | POA: Diagnosis present

## 2019-05-30 DIAGNOSIS — R609 Edema, unspecified: Secondary | ICD-10-CM | POA: Diagnosis not present

## 2019-05-30 DIAGNOSIS — R079 Chest pain, unspecified: Secondary | ICD-10-CM | POA: Diagnosis not present

## 2019-05-30 DIAGNOSIS — R0602 Shortness of breath: Secondary | ICD-10-CM | POA: Diagnosis not present

## 2019-05-30 DIAGNOSIS — Z96653 Presence of artificial knee joint, bilateral: Secondary | ICD-10-CM | POA: Diagnosis present

## 2019-05-30 DIAGNOSIS — E0590 Thyrotoxicosis, unspecified without thyrotoxic crisis or storm: Secondary | ICD-10-CM | POA: Diagnosis present

## 2019-05-30 DIAGNOSIS — Z96659 Presence of unspecified artificial knee joint: Secondary | ICD-10-CM

## 2019-05-30 DIAGNOSIS — Z832 Family history of diseases of the blood and blood-forming organs and certain disorders involving the immune mechanism: Secondary | ICD-10-CM

## 2019-05-30 LAB — CBC WITH DIFFERENTIAL/PLATELET
Abs Immature Granulocytes: 0.02 10*3/uL (ref 0.00–0.07)
Basophils Absolute: 0 10*3/uL (ref 0.0–0.1)
Basophils Relative: 0 %
Eosinophils Absolute: 0.1 10*3/uL (ref 0.0–0.5)
Eosinophils Relative: 1 %
HCT: 45.2 % (ref 36.0–46.0)
Hemoglobin: 14.6 g/dL (ref 12.0–15.0)
Immature Granulocytes: 0 %
Lymphocytes Relative: 10 %
Lymphs Abs: 0.9 10*3/uL (ref 0.7–4.0)
MCH: 27.8 pg (ref 26.0–34.0)
MCHC: 32.3 g/dL (ref 30.0–36.0)
MCV: 85.9 fL (ref 80.0–100.0)
Monocytes Absolute: 0.7 10*3/uL (ref 0.1–1.0)
Monocytes Relative: 7 %
Neutro Abs: 7.4 10*3/uL (ref 1.7–7.7)
Neutrophils Relative %: 82 %
Platelets: 292 10*3/uL (ref 150–400)
RBC: 5.26 MIL/uL — ABNORMAL HIGH (ref 3.87–5.11)
RDW: 14.1 % (ref 11.5–15.5)
WBC: 9.1 10*3/uL (ref 4.0–10.5)
nRBC: 0 % (ref 0.0–0.2)

## 2019-05-30 LAB — LIPID PANEL
Cholesterol: 142 mg/dL (ref 0–200)
HDL: 55 mg/dL (ref >40)
LDL Cholesterol: 79 mg/dL (ref 0–99)
Total CHOL/HDL Ratio: 2.6 RATIO
Triglycerides: 42 mg/dL (ref <150)
VLDL: 8 mg/dL (ref 0–40)

## 2019-05-30 LAB — COMPREHENSIVE METABOLIC PANEL
ALT: 21 U/L (ref 0–44)
AST: 20 U/L (ref 15–41)
Albumin: 3.5 g/dL (ref 3.5–5.0)
Alkaline Phosphatase: 102 U/L (ref 38–126)
Anion gap: 13 (ref 5–15)
BUN: 12 mg/dL (ref 8–23)
CO2: 19 mmol/L — ABNORMAL LOW (ref 22–32)
Calcium: 9.1 mg/dL (ref 8.9–10.3)
Chloride: 107 mmol/L (ref 98–111)
Creatinine, Ser: 1.04 mg/dL — ABNORMAL HIGH (ref 0.44–1.00)
GFR calc Af Amer: >60 mL/min (ref >60)
GFR calc non Af Amer: 56 mL/min — ABNORMAL LOW (ref >60)
Glucose, Bld: 130 mg/dL — ABNORMAL HIGH (ref 70–99)
Potassium: 4.2 mmol/L (ref 3.5–5.1)
Sodium: 139 mmol/L (ref 135–145)
Total Bilirubin: 0.3 mg/dL (ref 0.3–1.2)
Total Protein: 6.7 g/dL (ref 6.5–8.1)

## 2019-05-30 LAB — CBC
HCT: 43.5 % (ref 36.0–46.0)
Hemoglobin: 14.1 g/dL (ref 12.0–15.0)
MCH: 27.6 pg (ref 26.0–34.0)
MCHC: 32.4 g/dL (ref 30.0–36.0)
MCV: 85.3 fL (ref 80.0–100.0)
Platelets: 298 10*3/uL (ref 150–400)
RBC: 5.1 MIL/uL (ref 3.87–5.11)
RDW: 14.2 % (ref 11.5–15.5)
WBC: 10.9 10*3/uL — ABNORMAL HIGH (ref 4.0–10.5)
nRBC: 0 % (ref 0.0–0.2)

## 2019-05-30 LAB — SARS CORONAVIRUS 2 BY RT PCR (HOSPITAL ORDER, PERFORMED IN ~~LOC~~ HOSPITAL LAB): SARS Coronavirus 2: NEGATIVE

## 2019-05-30 LAB — TROPONIN I (HIGH SENSITIVITY)
Troponin I (High Sensitivity): 108 ng/L (ref <18)
Troponin I (High Sensitivity): 578 ng/L (ref <18)

## 2019-05-30 LAB — CREATININE, SERUM
Creatinine, Ser: 1.07 mg/dL — ABNORMAL HIGH (ref 0.44–1.00)
GFR calc Af Amer: >60 mL/min (ref >60)
GFR calc non Af Amer: 54 mL/min — ABNORMAL LOW (ref >60)

## 2019-05-30 LAB — BRAIN NATRIURETIC PEPTIDE: B Natriuretic Peptide: 243.5 pg/mL — ABNORMAL HIGH (ref 0.0–100.0)

## 2019-05-30 LAB — PHOSPHORUS: Phosphorus: 4.4 mg/dL (ref 2.5–4.6)

## 2019-05-30 LAB — MAGNESIUM: Magnesium: 1.7 mg/dL (ref 1.7–2.4)

## 2019-05-30 LAB — TSH: TSH: <0.01 u[IU]/mL — ABNORMAL LOW (ref 0.350–4.500)

## 2019-05-30 IMAGING — DX DG CHEST 1V PORT
1 series · 1 of 1 positions shown · non-contrast
Comparison: [DATE].

CLINICAL DATA: 67-year-old presenting with acute onset of shortness
of breath that began earlier today. Patient is tachycardic. Patient
awakened with mid chest pain and tightness yesterday but that has
subsequently resolved. Current history of hypertension and asthma.

EXAM:
PORTABLE CHEST 1 VIEW

[chest]
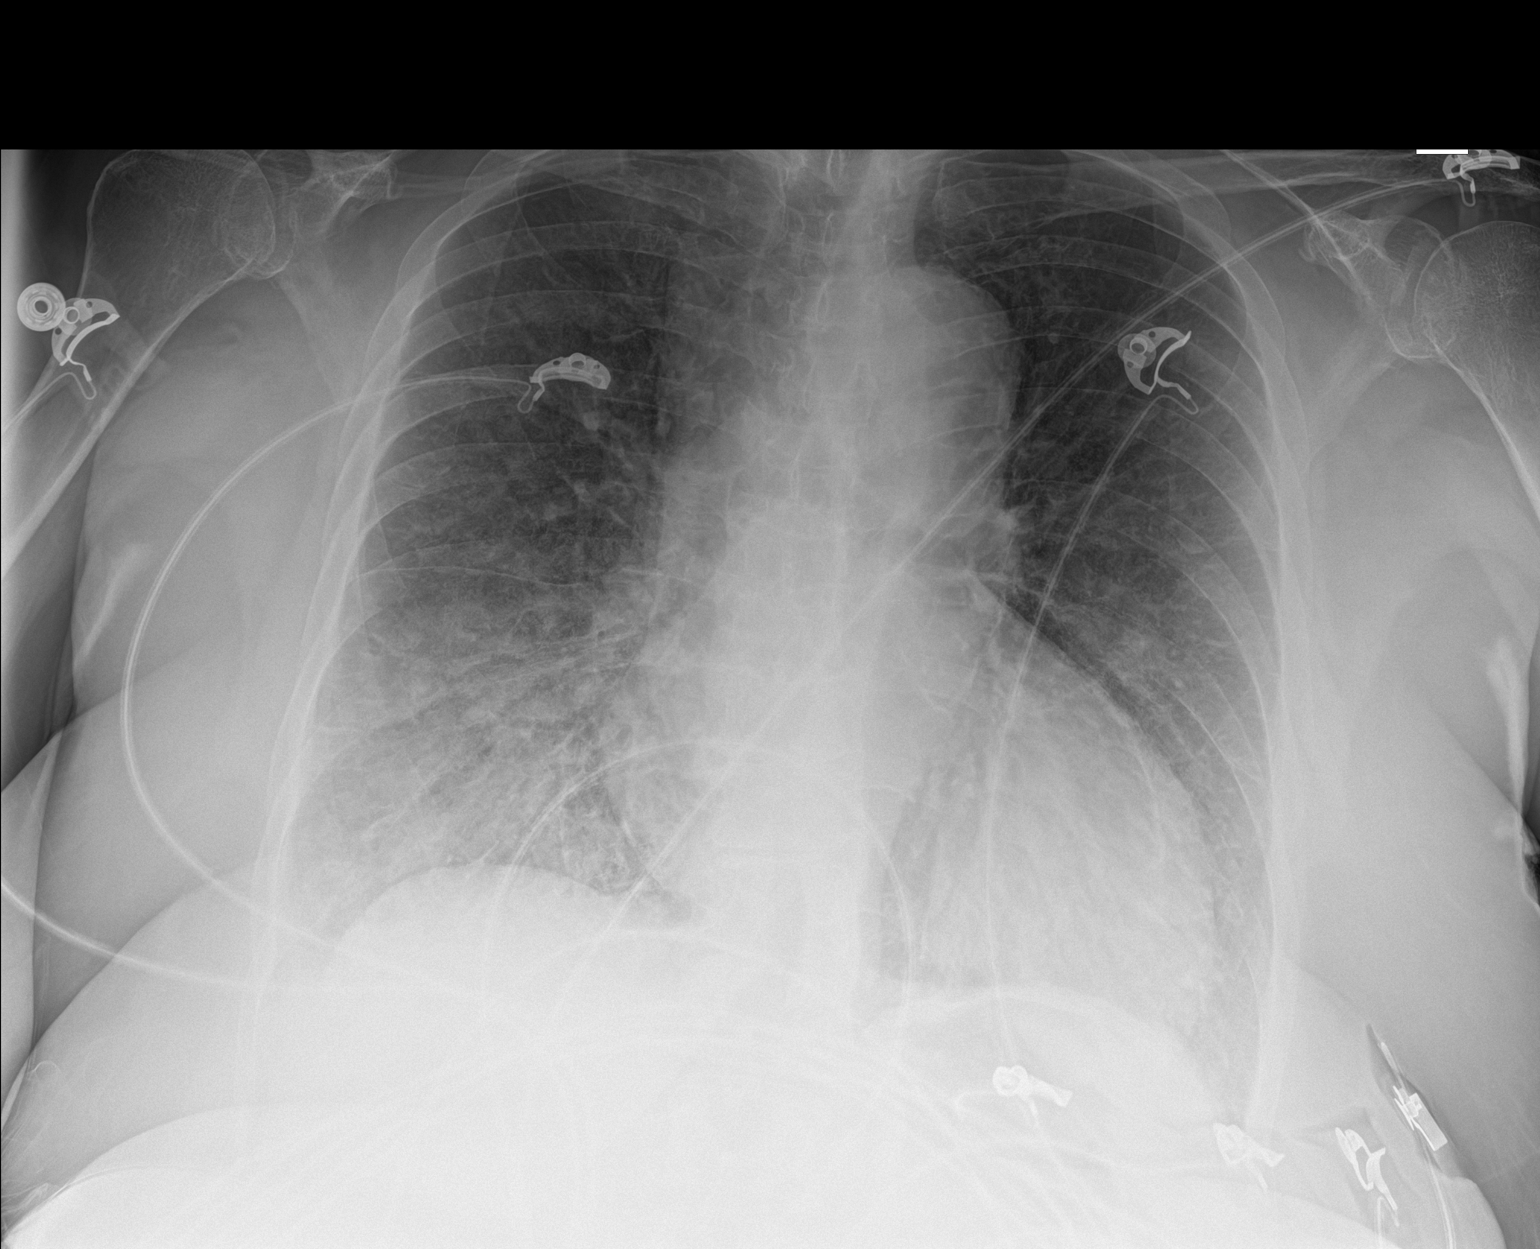

[1 of 1 positions shown; findings below may reference images not displayed]

FINDINGS: Cardiac silhouette upper normal in size to slightly enlarged for AP
portable technique. Thoracic aorta atherosclerotic. Hilar and
mediastinal contours otherwise unremarkable. Diffuse interstitial
pulmonary edema with Kerley B lines present diffusely bilaterally.
Asymmetric airspace opacities at the RIGHT lung base. Possible small
RIGHT pleural effusion.
IMPRESSION: 1. Mild CHF, with borderline to mild cardiomegaly and mild diffuse
interstitial pulmonary edema.
2. Asymmetric airspace opacities at the RIGHT lung base which may
represent asymmetric airspace edema or pneumonia. Possible small
RIGHT pleural effusion.

## 2019-05-30 MED ORDER — HEPARIN BOLUS VIA INFUSION
4000.0000 [IU] | Freq: Once | INTRAVENOUS | Status: AC
  Administered 2019-05-30: 4000 [IU] via INTRAVENOUS
  Filled 2019-05-30: qty 4000

## 2019-05-30 MED ORDER — FUROSEMIDE 10 MG/ML IJ SOLN: 40.0000 mg | Freq: Two times a day (BID) | INTRAMUSCULAR | Status: DC

## 2019-05-30 MED ORDER — ONDANSETRON HCL 4 MG PO TABS: 4.0000 mg | ORAL_TABLET | Freq: Four times a day (QID) | ORAL | Status: DC | PRN

## 2019-05-30 MED ORDER — ONDANSETRON HCL 4 MG/2ML IJ SOLN
4.0000 mg | Freq: Four times a day (QID) | INTRAMUSCULAR | Status: DC | PRN
  Administered 2019-05-30: 22:00:00 4 mg via INTRAVENOUS
  Filled 2019-05-30: qty 2

## 2019-05-30 MED ORDER — CARVEDILOL 3.125 MG PO TABS: 3.1250 mg | ORAL_TABLET | Freq: Two times a day (BID) | ORAL | Status: DC

## 2019-05-30 MED ORDER — IOHEXOL 350 MG/ML SOLN
100.0000 mL | Freq: Once | INTRAVENOUS | Status: AC | PRN
  Administered 2019-05-30: 55 mL via INTRAVENOUS

## 2019-05-30 MED ORDER — FUROSEMIDE 10 MG/ML IJ SOLN
40.0000 mg | Freq: Two times a day (BID) | INTRAMUSCULAR | Status: DC
  Administered 2019-05-30 – 2019-06-01 (×4): 40 mg via INTRAVENOUS
  Filled 2019-05-30 (×4): qty 4

## 2019-05-30 MED ORDER — ENOXAPARIN SODIUM 40 MG/0.4ML ~~LOC~~ SOLN: 40.0000 mg | SUBCUTANEOUS | Status: DC

## 2019-05-30 MED ORDER — GUAIFENESIN-DM 100-10 MG/5ML PO SYRP: 5.0000 mL | ORAL_SOLUTION | ORAL | Status: DC | PRN

## 2019-05-30 MED ORDER — FUROSEMIDE 10 MG/ML IJ SOLN
40.0000 mg | Freq: Once | INTRAMUSCULAR | Status: AC
  Administered 2019-05-30: 13:00:00 40 mg via INTRAVENOUS
  Filled 2019-05-30: qty 4

## 2019-05-30 MED ORDER — ASPIRIN 81 MG PO CHEW
81.0000 mg | CHEWABLE_TABLET | Freq: Every day | ORAL | Status: DC
  Administered 2019-05-30 – 2019-06-03 (×5): 81 mg via ORAL
  Filled 2019-05-30 (×5): qty 1

## 2019-05-30 MED ORDER — HEPARIN (PORCINE) 25000 UT/250ML-% IV SOLN
1300.0000 [IU]/h | INTRAVENOUS | Status: DC
  Administered 2019-05-30: 19:00:00 900 [IU]/h via INTRAVENOUS
  Filled 2019-05-30: qty 250

## 2019-05-30 MED ORDER — PANTOPRAZOLE SODIUM 40 MG PO TBEC
40.0000 mg | DELAYED_RELEASE_TABLET | Freq: Every day | ORAL | Status: DC
  Administered 2019-05-31 – 2019-06-04 (×5): 40 mg via ORAL
  Filled 2019-05-30 (×4): qty 1
  Filled 2019-05-30: qty 2

## 2019-05-30 MED ORDER — ATORVASTATIN CALCIUM 80 MG PO TABS
80.0000 mg | ORAL_TABLET | Freq: Every day | ORAL | Status: DC
  Filled 2019-05-30 (×5): qty 1

## 2019-05-30 NOTE — Plan of Care (Signed)
  Problem: Education: Goal: Ability to demonstrate management of disease process will improve Outcome: Progressing Goal: Ability to verbalize understanding of medication therapies will improve Outcome: Progressing Goal: Individualized Educational Video(s) Outcome: Progressing   Problem: Activity: Goal: Capacity to carry out activities will improve Outcome: Progressing   Problem: Cardiac: Goal: Ability to achieve and maintain adequate cardiopulmonary perfusion will improve Outcome: Progressing   Problem: Education: Goal: Understanding of cardiac disease, CV risk reduction, and recovery process will improve Outcome: Progressing Goal: Individualized Educational Video(s) Outcome: Progressing   Problem: Activity: Goal: Ability to tolerate increased activity will improve Outcome: Progressing   Problem: Cardiac: Goal: Ability to achieve and maintain adequate cardiovascular perfusion will improve Outcome: Progressing   Problem: Health Behavior/Discharge Planning: Goal: Ability to safely manage health-related needs after discharge will improve Outcome: Progressing   Problem: Education: Goal: Knowledge of General Education information will improve Description: Including pain rating scale, medication(s)/side effects and non-pharmacologic comfort measures Outcome: Progressing   Problem: Health Behavior/Discharge Planning: Goal: Ability to manage health-related needs will improve Outcome: Progressing   Problem: Clinical Measurements: Goal: Ability to maintain clinical measurements within normal limits will improve Outcome: Progressing Goal: Will remain free from infection Outcome: Progressing Goal: Diagnostic test results will improve Outcome: Progressing Goal: Respiratory complications will improve Outcome: Progressing Goal: Cardiovascular complication will be avoided Outcome: Progressing   Problem: Activity: Goal: Risk for activity intolerance will decrease Outcome:  Progressing   Problem: Nutrition: Goal: Adequate nutrition will be maintained Outcome: Progressing   Problem: Coping: Goal: Level of anxiety will decrease Outcome: Progressing   Problem: Elimination: Goal: Will not experience complications related to bowel motility Outcome: Progressing Goal: Will not experience complications related to urinary retention Outcome: Progressing   Problem: Pain Managment: Goal: General experience of comfort will improve Outcome: Progressing   Problem: Safety: Goal: Ability to remain free from injury will improve Outcome: Progressing   Problem: Skin Integrity: Goal: Risk for impaired skin integrity will decrease Outcome: Progressing   

## 2019-05-30 NOTE — Progress Notes (Signed)
ANTICOAGULATION CONSULT NOTE - Initial Consult  Pharmacy Consult for heparin Indication: chest pain/ACS  Allergies  Allergen Reactions  . Hydrochlorothiazide Other (See Comments) and Hypertension    Wildly fluctuates B/P low-to-high, then high-to-low, in a matter of minutes  . Hydrocodone-Acetaminophen Nausea And Vomiting  . Other Nausea And Vomiting and Other (See Comments)    Stronger pain meds (post-op) caused dehydration and constipation    Patient Measurements: Height: 5\' 1"  (154.9 cm) Weight: 216 lb 9.6 oz (98.2 kg) IBW/kg (Calculated) : 47.8 Heparin Dosing Weight: 71.3 kg  Vital Signs: Temp: 97.8 F (36.6 C) (09/08 1753) Temp Source: Oral (09/08 1753) BP: 149/84 (09/08 1753) Pulse Rate: 117 (09/08 1753)  Labs: Recent Labs    05/30/19 1134 05/30/19 1449  HGB 14.6  --   HCT 45.2  --   PLT 292  --   CREATININE 1.04*  --   TROPONINIHS 108* 578*    Estimated Creatinine Clearance: 57.9 mL/min (A) (by C-G formula based on SCr of 1.04 mg/dL (H)).   Medical History: Past Medical History:  Diagnosis Date  . 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 Asthma    hx of - PT STATES MILD - NO LONGER REQUIRES INHALERS  . Colitis    mild   . GERD (gastroesophageal reflux disease)   . H/O hiatal hernia   . Hypertension    hx of 10 years ago ; PT'S BLOOD PRESSURE RECENTLY ELEVATED WHILE IN HOSP FOR SURG- NOT ON ANY B/P MEDS    Medications:  Scheduled:  . aspirin  81 mg Oral Daily  . atorvastatin  80 mg Oral q1800  . enoxaparin (LOVENOX) injection  40 mg Subcutaneous Q24H  . furosemide  40 mg Intravenous BID  . [START ON 05/31/2019] pantoprazole  40 mg Oral Daily   Infusions:    Assessment: 68 yoF admitted for NSTEMI. CTA with no PE. Possibility of inferior wall MI. No AC PTA. Pharmacy consulted to dose heparin. Cardiac cath planned once pt stable.  CBC wnl. No bleeding noted. Scr 1.04.   Goal of  Therapy:  Heparin level 0.3-0.7 units/ml Monitor platelets by anticoagulation protocol: Yes   Plan:  Heparin bolus 4000 units Heparin infusion at 900 units/hr Heparin level in 6 hours Monitor daily heparin level, CBC, and s/sx of bleeding F/u [facility] plan post-cath  Berenice Bouton, PharmD PGY1 Pharmacy Resident Phone until 9 pm: [phone] 05/30/2019,6:00 PM

## 2019-05-30 NOTE — Progress Notes (Signed)
Vitals improving.

## 2019-05-30 NOTE — ED Provider Notes (Signed)
[facility] EMERGENCY DEPARTMENT Provider Note   CSN: [id number] Arrival date & time: 05/30/19  1117     History   Chief Complaint Chief Complaint  Patient presents with  . Shortness of Breath  . Cough    HPI Christina [name] is a 68 y.o. female with h/o GERD, HTN presents to ER by EMS for evaluation of sudden onset, persistent exertional shortness of breath that began while she was walking into [employer] at around 0945.  Reports associated wet sounding cough, exertional fatigue and palpitations.  Found to be hypoxemia SpO2 88% by EMS with HR 150s.  She was placed on 16 L NRB then 4 L [place] and SpO2 95% upon arrival.  Yesterday morning when she woke up she had diffuse chest "pressure" and "heaviness" while still laying in bed. She thought it was related to picking up a heavy toy the night before, this chest discomfort has resolved.  She denies current CP.  Reports having exertional shortness of breath and fatigue "for years".  States if she walks across her house she usually gets winded and has to sit down. This has been going on for years.  Yesterday she vacuumed her home 1400 sq ft and had to sit down several times due to exertional SOB. Has also noticed intermittent leg swelling.  When she does yard work she also gets winded and has a dry cough that eventually resolved without interventions.  She never has exertional chest pain or pressure. H/o childhood asthma but not treated for over 20 years.  She denies h/o heart failure, CAD, Mis, DVT/PE.   No recent fevers, chills, congestion, sore throat, vomiting, diarrhea.  No orthopnea, PND, calf pain, hemoptysis.       HPI  Past Medical History:  Diagnosis Date  . 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 Asthma    hx of - PT STATES MILD - NO LONGER REQUIRES INHALERS  . Colitis    mild   . GERD (gastroesophageal reflux disease)   . H/O hiatal hernia   .  Hypertension    hx of 10 years ago ; PT'S BLOOD PRESSURE RECENTLY ELEVATED WHILE IN HOSP FOR SURG- NOT ON ANY B/P MEDS    Patient Active Problem List   Diagnosis Date Noted  . CHF (congestive heart failure) ([place]) 05/30/2019  . S/P knee replacement 06/05/2014  . Expected blood loss anemia 05/09/2014  . Obese 05/09/2014  . S/P right TKA 05/07/2014  . GLAUCOMA 08/26/2010  . ASTHMA, UNSPECIFIED 08/26/2010  . COLITIS, ACUTE 08/26/2010  . OSTEOARTHRITIS 08/26/2010  . HIATAL HERNIA, HX OF 08/26/2010  . HEMORRHOIDS-INTERNAL 08/20/2010  . RECTAL BLEEDING 08/20/2010    Past Surgical History:  Procedure Laterality Date  . KNEE CLOSED REDUCTION Left 06/05/2014   Procedure: CLOSED MANIPULATION KNEE;  Surgeon: Mauri Pole, MD;  Location: [facility];  Service: Orthopedics;  Laterality: Left;  . TOTAL KNEE ARTHROPLASTY Left 05/07/2014   Procedure: LEFT TOTAL KNEE ARTHROPLASTY;  Surgeon: Mauri Pole, MD;  Location: [facility];  Service: Orthopedics;  Laterality: Left;  . TOTAL KNEE ARTHROPLASTY Right 06/05/2014   Procedure: RIGHT TOTAL KNEE ARTHROPLASTY;  Surgeon: Mauri Pole, MD;  Location: [facility];  Service: Orthopedics;  Laterality: Right;  . WISDOM TEETH EXTRACTIONS       OB History   No obstetric history on file.      Home Medications  Prior to Admission medications   Medication Sig Start Date End Date Taking? Authorizing Provider  amoxicillin (AMOXIL) 500 MG capsule Take 2,000 mg by mouth See admin instructions. Take 2,000 mg by mouth one hour prior to dental appointments 02/16/19  Yes [provider]  aspirin (GOODSENSE ASPIRIN) 325 MG tablet Take 325 mg by mouth daily.    Yes [provider]  Cholecalciferol (VITAMIN D3) 25 MCG (1000 UT) CAPS Take 1,000 Units by mouth daily.    Yes [provider]  Dapsone (ACZONE) 5 % topical gel Apply 1 application topically See admin instructions. Apply to the face daily as directed   Yes [provider]   esomeprazole (NEXIUM 24HR) 20 MG capsule Take 20 mg by mouth daily before breakfast.   Yes [provider]  Flaxseed Oil (LINSEED OIL) OIL Take 1 capsule by mouth daily.    Yes [provider]  fluticasone (FLONASE) 50 MCG/ACT nasal spray Place 1-2 sprays into both nostrils daily as needed (for seasonal allergies).   Yes [provider]  loratadine (CLARITIN) 10 MG tablet Take 10 mg by mouth daily.   Yes [provider]  Multiple Vitamins-Minerals (LUTEIN-ZEAXANTHIN) TABS Take 1 tablet by mouth daily.   Yes [provider]  naproxen sodium (ALEVE) 220 MG tablet Take 220-440 mg by mouth daily.   Yes [provider]  RESVERATROL PO Take 1 capsule by mouth daily.   Yes [provider]  sulfamethoxazole-trimethoprim (BACTRIM DS) 800-160 MG per tablet Take 1 tablet by mouth 2 (two) times daily.   Yes [provider]  tretinoin (RETIN-A) 0.1 % cream Apply 1 application topically See admin instructions. Apply to face nightly at bedtime   Yes [provider]  ferrous sulfate 325 (65 FE) MG tablet Take 1 tablet (325 mg total) by mouth 3 (three) times daily after meals. Patient not taking: Reported on 05/30/2019 06/06/14   Danae Orleans, PA-C  oxyCODONE (OXY IR/ROXICODONE) 5 MG immediate release tablet Take 1-3 tablets (5-15 mg total) by mouth every 4 (four) hours as needed for severe pain. Patient not taking: Reported on 05/30/2019 06/06/14   Danae Orleans, PA-C  polyethylene glycol (MIRALAX / Floria Raveling) packet Take 17 g by mouth 2 (two) times daily. Patient not taking: Reported on 05/30/2019 06/06/14   Danae Orleans, PA-C  tiZANidine (ZANAFLEX) 4 MG tablet Take 1 tablet (4 mg total) by mouth every 6 (six) hours as needed for muscle spasms. Patient not taking: Reported on 05/30/2019 06/06/14   Danae Orleans, PA-C    Family History No family history on file.  Social History Social History   Tobacco Use  . Smoking status:  Never Smoker  . Smokeless tobacco: Never Used  Substance Use Topics  . Alcohol use: No  . Drug use: No     Allergies   Hydrochlorothiazide, Hydrocodone-acetaminophen, and Other   Review of Systems Review of Systems  Respiratory: Positive for cough and shortness of breath.   Cardiovascular: Positive for chest pain (last night) and palpitations.  All other systems reviewed and are negative.    Physical Exam Updated Vital Signs BP 121/78   Pulse (!) 110   Temp 98.3 F (36.8 C) (Oral)   Resp (!) 23   SpO2 99%   Physical Exam Vitals signs and nursing note reviewed.  Constitutional:      Appearance: She is well-developed.     Comments: Awake, alert.  Nontoxic.  HENT:     Head: Normocephalic and atraumatic.  Nose: Nose normal.  Eyes:     Conjunctiva/sclera: Conjunctivae normal.  Neck:     Musculoskeletal: Normal range of motion.  Cardiovascular:     Rate and Rhythm: Normal rate and regular rhythm.     Comments: 1+ radial and DP pulses bilaterally.  No lower extremity edema.  No calf tenderness.  Palpable pulse appears regular.  No obvious JVD. Pulmonary:     Effort: Pulmonary effort is normal.     Breath sounds: Rales present.     Comments: Diffuse inspiratory and expiratory crackles in all lung fields more significantly in the middle and lower lobes.  On 4 L [place] with SPO2 greater than 95%.  Tachypneic in the high 20s.  Speaking in full sentences.  Dry sounding cough during exam. Abdominal:     General: Bowel sounds are normal.     Palpations: Abdomen is soft.     Tenderness: There is no abdominal tenderness.     Comments: No G/R/R. No suprapubic or CVA tenderness. Negative Murphy's and McBurney's. Active BS to lower quadrants.   Musculoskeletal: Normal range of motion.  Skin:    General: Skin is warm and dry.     Capillary Refill: Capillary refill takes less than 2 seconds.  Neurological:     Mental Status: She is alert.  Psychiatric:        Behavior: Behavior  normal.      ED Treatments / Results  Labs (all labs ordered are listed, but only abnormal results are displayed) Labs Reviewed  CBC WITH DIFFERENTIAL/PLATELET - Abnormal; Notable for the following components:      Result Value   RBC 5.26 (*)    All other components within normal limits  COMPREHENSIVE METABOLIC PANEL - Abnormal; Notable for the following components:   CO2 19 (*)    Glucose, Bld 130 (*)    Creatinine, Ser 1.04 (*)    GFR calc non Af Amer 56 (*)    All other components within normal limits  BRAIN NATRIURETIC PEPTIDE - Abnormal; Notable for the following components:   B Natriuretic Peptide 243.5 (*)    All other components within normal limits  TROPONIN I (HIGH SENSITIVITY) - Abnormal; Notable for the following components:   Troponin I (High Sensitivity) 108 (*)    All other components within normal limits  TROPONIN I (HIGH SENSITIVITY) - Abnormal; Notable for the following components:   Troponin I (High Sensitivity) 578 (*)    All other components within normal limits  SARS CORONAVIRUS 2 (HOSPITAL ORDER, [place] [facility])  CBC  CREATININE, SERUM  HIV ANTIBODY (ROUTINE TESTING W REFLEX)  MAGNESIUM  PHOSPHORUS  TSH  URINALYSIS, ROUTINE W REFLEX MICROSCOPIC  LIPID PANEL  HEMOGLOBIN [id number]  BASIC METABOLIC PANEL  CBC    EKG EKG Interpretation  Date/Time:  Tuesday May 30 2019 11:18:57 EDT Ventricular Rate:  126 PR Interval:    QRS Duration: 82 QT Interval:  340 QTC Calculation: 493 R Axis:   34 Text Interpretation:  Sinus tachycardia Probable left atrial enlargement Anteroseptal infarct, old Confirmed by Davonna Belling [phone]) on 05/30/2019 11:21:50 AM   Radiology Ct Angio Chest Pe W And/or Wo Contrast  Result Date: 05/30/2019 CLINICAL DATA:  Acute onset of shortness of breath. Hypoxemia. EXAM: CT ANGIOGRAPHY CHEST WITH CONTRAST TECHNIQUE: Multidetector CT imaging of the chest was performed using the standard protocol during  bolus administration of intravenous contrast. Multiplanar CT image reconstructions and MIPs were obtained to evaluate the vascular anatomy. CONTRAST:  29mL OMNIPAQUE IOHEXOL 350 MG/ML SOLN COMPARISON:  Chest x-ray dated 05/30/2019 FINDINGS: Cardiovascular: No pulmonary emboli. Chronic cardiomegaly. No significant pericardial effusion. Mediastinum/Nodes: No enlarged mediastinal, hilar, or axillary lymph nodes. Thyroid gland, trachea, and esophagus demonstrate no significant findings. Lungs/Pleura: There are extensive bilateral pulmonary infiltrates without effusions. There is peripheral sparing of the pulmonary parenchyma bilaterally suggesting acute pulmonary edema. There is interstitial edema at the lung bases. Upper Abdomen: No acute abnormality. Musculoskeletal: No chest wall abnormality. No acute or significant osseous findings. Review of the MIP images confirms the above findings. IMPRESSION: 1. No pulmonary emboli. 2. Extensive bilateral pulmonary infiltrates without effusions, likely pulmonary edema. 3. Interstitial edema at the lung bases.  Cardiomegaly. 4. Given the acute onset of probable flash pulmonary edema, the possibility of an inferior wall myocardial infarction should be considered. Electronically Signed   By: Lorriane Shire M.D.   On: 05/30/2019 14:44   Dg Chest Port 1 View  Result Date: 05/30/2019 CLINICAL DATA:  68 year old presenting with acute onset of shortness of breath that began earlier today. Patient is tachycardic. Patient awakened with mid chest pain and tightness yesterday but that has subsequently resolved. Current history of hypertension and asthma. EXAM: PORTABLE CHEST 1 VIEW COMPARISON:  04/30/2014. FINDINGS: Cardiac silhouette upper normal in size to slightly enlarged for AP portable technique. Thoracic aorta atherosclerotic. Hilar and mediastinal contours otherwise unremarkable. Diffuse interstitial pulmonary edema with Kerley B lines present diffusely bilaterally. Asymmetric  airspace opacities at the RIGHT lung base. Possible small RIGHT pleural effusion. IMPRESSION: 1. Mild CHF, with borderline to mild cardiomegaly and mild diffuse interstitial pulmonary edema. 2. Asymmetric airspace opacities at the RIGHT lung base which may represent asymmetric airspace edema or pneumonia. Possible small RIGHT pleural effusion. Electronically Signed   By: Evangeline Dakin M.D.   On: 05/30/2019 12:37    Procedures Procedures (including critical care time)  Medications Ordered in ED Medications  enoxaparin (LOVENOX) injection 40 mg (has no administration in time range)  ondansetron (ZOFRAN) tablet 4 mg (has no administration in time range)    Or  ondansetron (ZOFRAN) injection 4 mg (has no administration in time range)  pantoprazole (PROTONIX) EC tablet 40 mg (has no administration in time range)  aspirin chewable tablet 81 mg (has no administration in time range)  atorvastatin (LIPITOR) tablet 80 mg (has no administration in time range)  furosemide (LASIX) injection 40 mg (40 mg Intravenous Given 05/30/19 1306)  iohexol (OMNIPAQUE) 350 MG/ML injection 100 mL (55 mLs Intravenous Contrast Given 05/30/19 1409)     Initial Impression / Assessment and Plan / ED Course  I have reviewed the triage vital signs and the nursing notes.  Pertinent labs & imaging results that were available during my care of the patient were reviewed by me and considered in my medical decision making (see chart for details).  Clinical Course as of May 29 1746  Tue May 30, 2019  1247 1. Mild CHF, with borderline to mild  cardiomegaly and mild diffuse interstitial pulmonary edema. 2. Asymmetric airspace opacities at the RIGHT lung base which may represent asymmetric airspace edema or pneumonia. Possible small RIGHT pleural effusion.  DG Chest Port 1 View [CG]  1335 B Natriuretic Peptide(!): 243.5 [CG]  1336 Troponin I (High Sensitivity)(!!): 108 [CG]  1336 SARS Coronavirus 2: NEGATIVE [CG]    Clinical  Course User Index [CG] Kinnie Feil, PA-C   68 yo F here with sudden onset exertional SOB, palpitations, productive cough around 0945 today.  Upon further  questioning actually admits to long standing, exertional fatigue, SOB for "years". She gets a cough when she is winded.  She gets SOB with walking across her 1400 sq ft home and doing yard work.  No CP currently but had chest pressure yesterday morning at rest while laying in bed.  In the field she was hypoxemic in the 80s, tachycardic 150s. Placed on NRB and [place].   Arrives tachycardic in 120s and requiring supplemental oxygen.  She has diffuse crackles on exam, tachypnic.    She reports some warning signs of cardiac etiology.  Ddx includes unstable angina vs deconditioning vs HF.  She has no underlying lung disease.  Given acute onset SOB however other etiologies considered as well including PE although she has no risks for this.    ER work up reviewed by me. EKG shows sinus tachycardia 120s, RA enlargement and Q waves in septal leads.  She denies CP but had chest pressure yesterday morning. Trop [id number].  She was re-evaluated and she denied CP.  CXR shows effusions, pulmonary effusions.  CTA obtained negative for PE but confirms cardiomegaly, effusions and edema.  Radiology suggests considered of inferior MI which was considered here. We repeated EKG and no ischemic changes.  She continues to deny CP.  BNP 243.  COVID is negative.  Work up suggestive of new onset HF.    She was given IV lasix.   Re-evaluated pt.  Tachycardia slightly improved. No clinical decline or respiratory decline. Updated pt on work up and plan to admit.   Discussed with medicine team who will admit for respiratory failure likely from new onset HF.  Final Clinical Impressions(s) / ED Diagnoses   Final diagnoses:  Acute respiratory failure with hypoxia ([place])  Acute pulmonary edema [facility])    ED Discharge Orders    None       Arlean Hopping  05/30/19 1747    Davonna Belling, MD 06/02/19 2328

## 2019-05-30 NOTE — ED Triage Notes (Signed)
Pt arrives by ems where she came from a [employer] store after walking into the store she had a sudden onset of SOB and cough. Pt state yesterday she was having some chest pressure but denies cough or fever. Pt was 88% on roomair with HR 150 with ems. Pt was placed on 15L NRB. Pt was placed on 4L [place] on arrival to ED is 95%. HR 125- pt is having a dry cough.

## 2019-05-30 NOTE — ED Notes (Signed)
Patient's sister, Jacqlyn Larsen, would like to visit patient once Covid test result comes back - she may be reached at [phone].

## 2019-05-30 NOTE — Consult Note (Signed)
Cardiology Consultation     Patient ID: Christina [name] MRN: [id number], DOB/AGE: Oct 11, 1950   Admit date: 05/30/2019  Primary Physician: Mateo Flow, MD Primary Cardiologist: New to [facility]- Dr. Percival Spanish   Patient Profile    Christina [name] is a 68yo F with a hx of HTN, GERD, remote asthma and arthritis who presented to the ED after acute onset of SOB which occurred earlier today while shopping at [employer].   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 - STILL IN PHYSICAL THERAPY   Asthma    hx of - PT STATES MILD - NO LONGER REQUIRES INHALERS   Colitis    mild    GERD (gastroesophageal reflux disease)    H/O hiatal hernia    Hypertension    hx of 10 years ago ; PT'S BLOOD PRESSURE RECENTLY ELEVATED WHILE IN HOSP FOR SURG- NOT ON ANY B/P MEDS    Past Surgical History:  Procedure Laterality Date   KNEE CLOSED REDUCTION Left 06/05/2014   Procedure: CLOSED MANIPULATION KNEE;  Surgeon: Mauri Pole, MD;  Location: [facility];  Service: Orthopedics;  Laterality: Left;   TOTAL KNEE ARTHROPLASTY Left 05/07/2014   Procedure: LEFT TOTAL KNEE ARTHROPLASTY;  Surgeon: Mauri Pole, MD;  Location: [facility];  Service: Orthopedics;  Laterality: Left;   TOTAL KNEE ARTHROPLASTY Right 06/05/2014   Procedure: RIGHT TOTAL KNEE ARTHROPLASTY;  Surgeon: Mauri Pole, MD;  Location: [facility];  Service: Orthopedics;  Laterality: Right;   WISDOM TEETH EXTRACTIONS       Allergies  Allergies  Allergen Reactions   Hydrochlorothiazide Other (See Comments) and Hypertension    Wildly fluctuates B/P low-to-high, then high-to-low, in a matter of minutes   Hydrocodone-Acetaminophen Nausea And Vomiting   Other Nausea And Vomiting and Other (See Comments)    Stronger pain meds (post-op) caused dehydration and constipation    History of Present Illness    Pt reports that she was shopping at [employer] earlier  today, 05/30/2019 when she developed sudden onset of SOB with cough, exertional fatigue and palpitations that began approximately 0945. Given her acute symptoms, EMS was called for transport to the ED for further evaluation. She reports one episode of chest pain described as a pressure with associated heaviness while laying in bed yesterday morning which resolved spontaneously. She initially thought this was related to heavy lifting the night before. She does state that she has had years of fatigue and dyspnea however had to sit down several times while vacuuming which is different for her. She reports never having chest pain in the past. She has no prior hx of CAD. She denies tobacco or alcohol use.   In the ED, HsT found to be 108 with a repeat 578. EKG with ST with HR 126bpm and no acute ischemic changes. Creatinine mildly elevated above her baseline from 2015. BNP elevated 243. CXR with mild CHF, with borderline to mild cardiomegaly and mild diffuse interstitial pulmonary edema. Asymmetric airspace opacities at the right lung base which may represent asymmetric airspace edema or pneumonia. Possible small right pleural effusion. Follow up CTA with no PE, chronic cardiomegaly, extensive bilateral pulmonary infiltrates without effusions likely pulmonary edema. Given the acute onset of probable flash pulmonary edema, the possibility of an inferior wall myocardial infarction should be considered.  On ED presentation, O2 saturations were 88% on RA. HR was  elevated at 150bpm. She was placed on 15L NRB >>then 4 L [place] with improvement. She was given IV Lasix '40mg'$ .   Home Medications    Prior to Admission medications   Medication Sig Start Date End Date Taking? Authorizing Provider  amoxicillin (AMOXIL) 500 MG capsule Take 2,000 mg by mouth See admin instructions. Take 2,000 mg by mouth one hour prior to dental appointments 02/16/19  Yes [provider]  aspirin (GOODSENSE ASPIRIN) 325 MG tablet Take  325 mg by mouth daily.    Yes [provider]  Cholecalciferol (VITAMIN D3) 25 MCG (1000 UT) CAPS Take 1,000 Units by mouth daily.    Yes [provider]  Dapsone (ACZONE) 5 % topical gel Apply 1 application topically See admin instructions. Apply to the face daily as directed   Yes [provider]  esomeprazole (NEXIUM 24HR) 20 MG capsule Take 20 mg by mouth daily before breakfast.   Yes [provider]  Flaxseed Oil (LINSEED OIL) OIL Take 1 capsule by mouth daily.    Yes [provider]  fluticasone (FLONASE) 50 MCG/ACT nasal spray Place 1-2 sprays into both nostrils daily as needed (for seasonal allergies).   Yes [provider]  loratadine (CLARITIN) 10 MG tablet Take 10 mg by mouth daily.   Yes [provider]  Multiple Vitamins-Minerals (LUTEIN-ZEAXANTHIN) TABS Take 1 tablet by mouth daily.   Yes [provider]  naproxen sodium (ALEVE) 220 MG tablet Take 220-440 mg by mouth daily.   Yes [provider]  RESVERATROL PO Take 1 capsule by mouth daily.   Yes [provider]  sulfamethoxazole-trimethoprim (BACTRIM DS) 800-160 MG per tablet Take 1 tablet by mouth 2 (two) times daily.   Yes [provider]  tretinoin (RETIN-A) 0.1 % cream Apply 1 application topically See admin instructions. Apply to face nightly at bedtime   Yes [provider]  ferrous sulfate 325 (65 FE) MG tablet Take 1 tablet (325 mg total) by mouth 3 (three) times daily after meals. Patient not taking: Reported on 05/30/2019 06/06/14   Danae Orleans, PA-C  oxyCODONE (OXY IR/ROXICODONE) 5 MG immediate release tablet Take 1-3 tablets (5-15 mg total) by mouth every 4 (four) hours as needed for severe pain. Patient not taking: Reported on 05/30/2019 06/06/14   Danae Orleans, PA-C  polyethylene glycol (MIRALAX / Floria Raveling) packet Take 17 g by mouth 2 (two) times daily. Patient not taking: Reported on 05/30/2019 06/06/14   Danae Orleans, PA-C  tiZANidine (ZANAFLEX) 4 MG tablet Take 1 tablet (4 mg total) by mouth every 6 (six) hours as needed for muscle spasms. Patient not taking: Reported on 05/30/2019 06/06/14   Danae Orleans, PA-C    Family History    There is a family history apparently in her brother and father of hereditary hemochromatosis.    Social History    Social History   Socioeconomic History   Marital status: Single    Spouse name: Not on file   Number of children: Not on file   Years of education: Not on file   Highest education level: Not on file  Occupational History   Not on file  Social Needs   Financial resource strain: Not on file   Food insecurity    Worry: Not on file    Inability: Not on file   Transportation needs    Medical: Not on file    Non-medical: Not on file  Tobacco Use   Smoking status: Never Smoker  Smokeless tobacco: Never Used  Substance and Sexual Activity   Alcohol use: No   Drug use: No   Sexual activity: Not on file  Lifestyle   Physical activity    Days per week: Not on file    Minutes per session: Not on file   Stress: Not on file  Relationships   Social connections    Talks on phone: Not on file    Gets together: Not on file    Attends religious service: Not on file    Active member of club or organization: Not on file    Attends meetings of clubs or organizations: Not on file    Relationship status: Not on file   Intimate partner violence    Fear of current or ex partner: Not on file    Emotionally abused: Not on file    Physically abused: Not on file    Forced sexual activity: Not on file  Other Topics Concern   Not on file  Social History Narrative   Not on file     Review of Systems   See HPI  All other systems reviewed and are otherwise negative except as noted above.  Physical Exam    Blood pressure 113/77, pulse (!) 114, temperature 98.3 F (36.8 C), temperature source Oral, resp. rate 20, SpO2 98 %.    General: Pleasant, well-nourished, A&O in NAD, appears stated age Psych: Normal affect. Neuro: Alert and oriented X 3. Moves all extremities spontaneously. HEENT: Eyes symmetric, no orbital edema. Conjunctive pink, sclera white, PERRLA, vision grossly intact.  Neck: Supple neck veins, no bruits .  Positive JVD.Trachea midline, no cervical lymphadenopathy.  Lungs: Symmetrical chest wall expansion.CTA w/ RRR. No accessory muscle use.  Heart: RRR positive s3, no s4, or murmurs.  Abdomen: Soft, non-tender, non-distended, BS + x 4.  Extremities: No clubbing, cyanosis.  Positive trace tredema. DP/PT/Radials 2+ and equal bilaterally. MSK: No palpable chest wall or thoracic tenderness  Labs    Troponin (Point of Care Test) No results for input(s): TROPIPOC in the last 72 hours. No results for input(s): CKTOTAL, CKMB, TROPONINI in the last 72 hours. Lab Results  Component Value Date   WBC 9.1 05/30/2019   HGB 14.6 05/30/2019   HCT 45.2 05/30/2019   MCV 85.9 05/30/2019   PLT 292 05/30/2019    Recent Labs  Lab 05/30/19 1134  NA 139  K 4.2  CL 107  CO2 19*  BUN 12  CREATININE 1.04*  CALCIUM 9.1  PROT 6.7  BILITOT 0.3  ALKPHOS 102  ALT 21  AST 20  GLUCOSE 130*   No results found for: CHOL, HDL, LDLCALC, TRIG No results found for: [facility]   Radiology Studies    Ct Angio Chest Pe W And/or Wo Contrast  Result Date: 05/30/2019 CLINICAL DATA:  Acute onset of shortness of breath. Hypoxemia. EXAM: CT ANGIOGRAPHY CHEST WITH CONTRAST TECHNIQUE: Multidetector CT imaging of the chest was performed using the standard protocol during bolus administration of intravenous contrast. Multiplanar CT image reconstructions and MIPs were obtained to evaluate the vascular anatomy. CONTRAST:  60m OMNIPAQUE IOHEXOL 350 MG/ML SOLN COMPARISON:  Chest x-ray dated 05/30/2019 FINDINGS: Cardiovascular: No pulmonary emboli. Chronic cardiomegaly. No significant pericardial effusion. Mediastinum/Nodes: No  enlarged mediastinal, hilar, or axillary lymph nodes. Thyroid gland, trachea, and esophagus demonstrate no significant findings. Lungs/Pleura: There are extensive bilateral pulmonary infiltrates without effusions. There is peripheral sparing of the pulmonary parenchyma bilaterally suggesting acute pulmonary edema. There is interstitial edema at  the lung bases. Upper Abdomen: No acute abnormality. Musculoskeletal: No chest wall abnormality. No acute or significant osseous findings. Review of the MIP images confirms the above findings. IMPRESSION: 1. No pulmonary emboli. 2. Extensive bilateral pulmonary infiltrates without effusions, likely pulmonary edema. 3. Interstitial edema at the lung bases.  Cardiomegaly. 4. Given the acute onset of probable flash pulmonary edema, the possibility of an inferior wall myocardial infarction should be considered. Electronically Signed   By: Lorriane Shire M.D.   On: 05/30/2019 14:44   Dg Chest Port 1 View  Result Date: 05/30/2019 CLINICAL DATA:  68 year old presenting with acute onset of shortness of breath that began earlier today. Patient is tachycardic. Patient awakened with mid chest pain and tightness yesterday but that has subsequently resolved. Current history of hypertension and asthma. EXAM: PORTABLE CHEST 1 VIEW COMPARISON:  04/30/2014. FINDINGS: Cardiac silhouette upper normal in size to slightly enlarged for AP portable technique. Thoracic aorta atherosclerotic. Hilar and mediastinal contours otherwise unremarkable. Diffuse interstitial pulmonary edema with Kerley B lines present diffusely bilaterally. Asymmetric airspace opacities at the RIGHT lung base. Possible small RIGHT pleural effusion. IMPRESSION: 1. Mild CHF, with borderline to mild cardiomegaly and mild diffuse interstitial pulmonary edema. 2. Asymmetric airspace opacities at the RIGHT lung base which may represent asymmetric airspace edema or pneumonia. Possible small RIGHT pleural effusion. Electronically  Signed   By: Evangeline Dakin M.D.   On: 05/30/2019 12:37   ECG & Cardiac Imaging    05/30/2019 NSR ST with HR 126 and no acute changes. No tracings for comparison.   Assessment & Plan    1. NSTEMI: -Pt presented [facility] on [facility]30[facility]2019 after developing sudden onset of SOB with cough, exertional fatigue and palpitations that began approximately 0945 while chopping at [facility] earlier today. Given her acute symptoms, EMS was called for transport to the ED for further evaluation. She reports one episode of chest pain described as a pressure with associated heaviness while laying in bed yesterday morning which resolved spontaneously. She initially thought this was related to heavy lifting the night before. She does state that she has had years of fatigue and dyspnea however had to sit down several times while vacuuming which is different for her. She reports never having chest pain in the past. She has no prior hx of CAD. She denies tobacco or alcohol use.  -EKG without concern for ACS, no ST changes -HsT found to be 108 with a repeat 578.  -EKG with ST with HR 126bpm and no acute ischemic changes. -BNP elevated 243>>given one dose of IV Lasix '40mg'$    -CXR with mild CHF, with borderline to mild cardiomegaly and mild diffuse interstitial pulmonary edema. Follow up CTA with no PE, chronic cardiomegaly, extensive bilateral pulmonary infiltrates without effusions likely pulmonary edema. Given the acute onset of probable flash pulmonary edema, the possibility of an inferior wall myocardial infarction should be considered. -Place on Hep gtt and plan for cardiac catheterization once respiratory pattern more stable   -Echocardiogram pending. Given CTA report>>worrisome for acute pulmonary edema in the setting of MI  -Continue IV Lasix '40mg'$  BID -Start ASA, BB -Continue statin  -Obtain lipid panel and Hb A1c for risk stratification  2. HTN: -Stable, 121/78>119/78>113/77>105/86 -Start low dose carvedilol in the  setting of ACS     Signed, Kathyrn Drown NP-C [facility] Pager: [phone] 05/30/2019, '@NOW'$     History and all data above reviewed.  Patient examined.  The patient has no past cardiac history.  She does have chronic  dyspnea on exertion.  She says this is been for years and has blamed it on weight in her knees.   She was in her usual state of health although she did have an episode of chest pressure when she woke up yesterday morning.  She said that she felt a heaviness in her chest and she is not had this before.  It was moderate in intensity.  There was no associated symptoms.  It resolved spontaneously.  She had no radiation to her jaw or to her arms.  She woke up this morning and felt okay.  She was a little lightheaded upon standing.  She said she has noticed her heart rate going up and down for years.  She will have occasional rapid rates.  However, she not have any had any presyncope or syncope.  She drove her self to [facility].  She was fine she got when she got in the car but she was very short of breath just walking to the store and only made it past the greeter.  She had to sit down.  She eventually had to be wheeled out of the store.  Her sister met her at the front and because she was so short of breath they called 911.  He was found to have edema on chest x-ray and CT.  She has a mildly elevated troponin.  She has sinus tachycardia.  She was initially hypoxemic as above.  She feels better after IV Lasix.  He was initially treated with a nonrebreather but is doing better on 4 L.  Of note interestingly she tells me that her brother has a history of hereditary hemochromatosis that her father had heart failure.  She is never had any cardiac work-up.  I agree with the findings as above.  The patient exam reveals COR:RRR, positive S3  ,  Lungs: Decreased breath sounds with bibasilar crackles.  ,  Abd: Positive bowel sounds, no rebound no guarding, Ext No edema  .  All available labs, radiology testing,  previous records reviewed. Agree with documented assessment and plan.   ACUTE SYSTOLIC HF: Her exam is consistent with acute systolic heart failure.  She is had no prior cardiac work-up.  She has minimal risk factors for obstructive coronary disease.  Interestingly there is a family history of [place] and she has never had any testing for this.  She needs to be diuresed.  Because of the elevated enzymes and yesterday's chest pressure I will treat her with heparin.  She will get aspirin.  We will start with an echocardiogram.  She will eventually need right and left heart cardiac catheterization but she will have to be able to lie flat without coughing to get this.  I will assess the timing of this of the next day or so.  Jeneen Rinks Makinsley Schiavi  5:59 PM  05/30/2019

## 2019-05-30 NOTE — ED Notes (Signed)
ED Provider at bedside. 

## 2019-05-30 NOTE — H&P (Addendum)
History and Physical    Christina [name] [id number] DOB: Apr 04, 1951 DOA: 05/30/2019  PCP: Mateo Flow, MD  Patient coming from: Home  I have personally briefly reviewed patient's old medical records in [place]  Chief Complaint: Acute onset of shortness of breath, productive cough started this morning.  HPI: Christina [name] is a 68 y.o. female with medical history significant of well-controlled hypertension, acid reflux, arthritis, asthma presents to emergency department after acute onset of shortness of breath with productive cough while she was shopping at [facility] this morning.  Patient reports chronic exertional shortness of breath and fatigue for years however this morning she was unable to catch her breath, EMS was called and patient was transported to the emergency department for further evaluation and management.  Patient reports chest pressure, heaviness, no aggravating or relieving factors since last night, she initially thought that her chest pain is likely secondary to heavy lifting.   Patient reports shortness of breath associated with mild leg swelling however denies orthopnea, PND, palpitation, fever, chills, sore throat, runny nose, recent exposure to COVID-19, headache, blurry vision, nausea, vomiting, epigastric pain, melena, sleep or appetite changes.  Her blood pressure is well controlled and she currently does not take any medication at home.  She only takes Bactrim for acne, naproxen for arthritis.  She lives alone and independent on daily life activities.  She denies smoking, alcohol, illicit drug use.  She denies previous history of coronary artery disease.  ED Course: In ED: She was found to have hypoxic with oxygen saturation of 88% on room air.  She was placed on oxygen 15 L NRB and then 4 L nasal cannula.  Her heart rate was in 150s.  Troponin was initially 108 and then trended up to 578.  She was tachycardic, EKG shows sinus tachycardia with heart rate of 126, no  ST-T wave changes noted.  proBNP: 243.  Chest x-ray shows mild CHF with borderline to mild cardiomegaly and interstitial pulmonary edema.  CT angiogram was also performed which came back negative for pulmonary embolism.  She was given 40 mg of IV Lasix once  Review of Systems: As per HPI otherwise negative.    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 - STILL IN PHYSICAL THERAPY   Asthma    hx of - PT STATES MILD - NO LONGER REQUIRES INHALERS   Colitis    mild    GERD (gastroesophageal reflux disease)    H/O hiatal hernia    Hypertension    hx of 10 years ago ; PT'S BLOOD PRESSURE RECENTLY ELEVATED WHILE IN HOSP FOR SURG- NOT ON ANY B/P MEDS    Past Surgical History:  Procedure Laterality Date   KNEE CLOSED REDUCTION Left 06/05/2014   Procedure: CLOSED MANIPULATION KNEE;  Surgeon: Mauri Pole, MD;  Location: [facility];  Service: Orthopedics;  Laterality: Left;   TOTAL KNEE ARTHROPLASTY Left 05/07/2014   Procedure: LEFT TOTAL KNEE ARTHROPLASTY;  Surgeon: Mauri Pole, MD;  Location: [facility];  Service: Orthopedics;  Laterality: Left;   TOTAL KNEE ARTHROPLASTY Right 06/05/2014   Procedure: RIGHT TOTAL KNEE ARTHROPLASTY;  Surgeon: Mauri Pole, MD;  Location: [facility];  Service: Orthopedics;  Laterality: Right;   WISDOM TEETH EXTRACTIONS       reports that she has never smoked. She has never used smokeless tobacco. She reports that she does not drink  alcohol or use drugs.  Allergies  Allergen Reactions   Hydrochlorothiazide Other (See Comments) and Hypertension    Wildly fluctuates B/P low-to-high, then high-to-low, in a matter of minutes   Hydrocodone-Acetaminophen Nausea And Vomiting   Other Nausea And Vomiting and Other (See Comments)    Stronger pain meds (post-op) caused dehydration and constipation    No family history on file.  Prior to Admission medications   Medication Sig Start Date  End Date Taking? Authorizing Provider  amoxicillin (AMOXIL) 500 MG capsule Take 2,000 mg by mouth See admin instructions. Take 2,000 mg by mouth one hour prior to dental appointments 02/16/19  Yes [provider]  aspirin (GOODSENSE ASPIRIN) 325 MG tablet Take 325 mg by mouth daily.    Yes [provider]  Cholecalciferol (VITAMIN D3) 25 MCG (1000 UT) CAPS Take 1,000 Units by mouth daily.    Yes [provider]  Dapsone (ACZONE) 5 % topical gel Apply 1 application topically See admin instructions. Apply to the face daily as directed   Yes [provider]  esomeprazole (NEXIUM 24HR) 20 MG capsule Take 20 mg by mouth daily before breakfast.   Yes [provider]  Flaxseed Oil (LINSEED OIL) OIL Take 1 capsule by mouth daily.    Yes [provider]  fluticasone (FLONASE) 50 MCG/ACT nasal spray Place 1-2 sprays into both nostrils daily as needed (for seasonal allergies).   Yes [provider]  loratadine (CLARITIN) 10 MG tablet Take 10 mg by mouth daily.   Yes [provider]  Multiple Vitamins-Minerals (LUTEIN-ZEAXANTHIN) TABS Take 1 tablet by mouth daily.   Yes [provider]  naproxen sodium (ALEVE) 220 MG tablet Take 220-440 mg by mouth daily.   Yes [provider]  RESVERATROL PO Take 1 capsule by mouth daily.   Yes [provider]  sulfamethoxazole-trimethoprim (BACTRIM DS) 800-160 MG per tablet Take 1 tablet by mouth 2 (two) times daily.   Yes [provider]  tretinoin (RETIN-A) 0.1 % cream Apply 1 application topically See admin instructions. Apply to face nightly at bedtime   Yes [provider]  ferrous sulfate 325 (65 FE) MG tablet Take 1 tablet (325 mg total) by mouth 3 (three) times daily after meals. Patient not taking: Reported on 05/30/2019 06/06/14   Danae Orleans, PA-C  oxyCODONE (OXY IR/ROXICODONE) 5 MG immediate release tablet Take 1-3 tablets (5-15 mg total) by mouth  every 4 (four) hours as needed for severe pain. Patient not taking: Reported on 05/30/2019 06/06/14   Danae Orleans, PA-C  polyethylene glycol (MIRALAX / Floria Raveling) packet Take 17 g by mouth 2 (two) times daily. Patient not taking: Reported on 05/30/2019 06/06/14   Danae Orleans, PA-C  tiZANidine (ZANAFLEX) 4 MG tablet Take 1 tablet (4 mg total) by mouth every 6 (six) hours as needed for muscle spasms. Patient not taking: Reported on 05/30/2019 06/06/14   Danae Orleans, PA-C    Physical Exam: Vitals:   05/30/19 1645 05/30/19 1700 05/30/19 1753 05/30/19 2001  BP: 119/78 121/78 (!) 149/84 127/77  Pulse: (!) 115 (!) 110 (!) 117 (!) 115  Resp: (!) 21 (!) 23 (!) 22 16  Temp:   97.8 F (36.6 C) 97.9 F (36.6 C)  TempSrc:   Oral Oral  SpO2: 97% 99% 98% 100%  Weight:   98.2 kg   Height:   5\' 1"  (1.549 m)     Constitutional: NAD, calm, comfortable Vitals:   05/30/19 1645 05/30/19 1700 05/30/19 1753 05/30/19  2001  BP: 119/78 121/78 (!) 149/84 127/77  Pulse: (!) 115 (!) 110 (!) 117 (!) 115  Resp: (!) 21 (!) 23 (!) 22 16  Temp:   97.8 F (36.6 C) 97.9 F (36.6 C)  TempSrc:   Oral Oral  SpO2: 97% 99% 98% 100%  Weight:   98.2 kg   Height:   5\' 1"  (1.549 m)    Constitutional: Sitting comfortably on the back, not in acute distress, alert and oriented x3, communicating well.   Eyes: PERRL, lids and conjunctivae normal ENMT: Mucous membranes are moist. Posterior pharynx clear of any exudate or lesions.Normal dentition.  Neck: normal, supple, no masses, no thyromegaly Respiratory: clear to auscultation bilaterally, no wheezing, no crackles. Normal respiratory effort. No accessory muscle use.  Cardiovascular: Tachycardic,, no murmurs / rubs / gallops. No extremity edema. 2+ pedal pulses. No carotid bruits.  Abdomen: no tenderness, no masses palpated. No hepatosplenomegaly. Bowel sounds positive.  Musculoskeletal: no clubbing / cyanosis. No joint deformity upper and lower extremities. Good ROM, no  contractures. Normal muscle tone.  Skin: no rashes, lesions, ulcers. No induration Neurologic: CN 2-12 grossly intact. Sensation intact, DTR normal. Strength 5/5 in all 4.  Psychiatric: Normal judgment and insight. Alert and oriented x 3. Normal mood.    Labs on Admission: I have personally reviewed following labs and imaging studies  CBC: Recent Labs  Lab 05/30/19 1134 05/30/19 1852  WBC 9.1 10.9*  NEUTROABS 7.4  --   HGB 14.6 14.1  HCT 45.2 43.5  MCV 85.9 85.3  PLT 292 [id number]   Basic Metabolic Panel: Recent Labs  Lab 05/30/19 1134 05/30/19 1852  NA 139  --   K 4.2  --   CL 107  --   CO2 19*  --   GLUCOSE 130*  --   BUN 12  --   CREATININE 1.04* 1.07*  CALCIUM 9.1  --   MG  --  1.7  PHOS  --  4.4   GFR: Estimated Creatinine Clearance: 56.3 mL/min (A) (by C-G formula based on SCr of 1.07 mg/dL (H)). Liver Function Tests: Recent Labs  Lab 05/30/19 1134  AST 20  ALT 21  ALKPHOS 102  BILITOT 0.3  PROT 6.7  ALBUMIN 3.5   No results for input(s): LIPASE, AMYLASE in the last 168 hours. No results for input(s): AMMONIA in the last 168 hours. Coagulation Profile: No results for input(s): INR, PROTIME in the last 168 hours. Cardiac Enzymes: No results for input(s): CKTOTAL, CKMB, CKMBINDEX, TROPONINI in the last 168 hours. BNP (last 3 results) No results for input(s): PROBNP in the last 8760 hours. HbA1C: No results for input(s): HGBA1C in the last 72 hours. CBG: No results for input(s): GLUCAP in the last 168 hours. Lipid Profile: Recent Labs    05/30/19 1852  CHOL 142  HDL 55  LDLCALC 79  TRIG 42  CHOLHDL 2.6   Thyroid Function Tests: Recent Labs    05/30/19 1853  TSH <0.010*   Anemia Panel: No results for input(s): VITAMINB12, FOLATE, FERRITIN, TIBC, IRON, RETICCTPCT in the last 72 hours. Urine analysis:    Component Value Date/Time   COLORURINE YELLOW 06/01/2014 0842   APPEARANCEUR CLEAR 06/01/2014 0842   LABSPEC 1.025 06/01/2014 0842    PHURINE 5.5 06/01/2014 0842   GLUCOSEU NEGATIVE 06/01/2014 0842   HGBUR NEGATIVE 06/01/2014 0842   BILIRUBINUR SMALL (A) 06/01/2014 0842   KETONESUR NEGATIVE 06/01/2014 0842   PROTEINUR NEGATIVE 06/01/2014 0842   UROBILINOGEN 0.2 06/01/2014 [id number]  NITRITE NEGATIVE 06/01/2014 0842   LEUKOCYTESUR NEGATIVE 06/01/2014 0842    Radiological Exams on Admission: Ct Angio Chest Pe W And/or Wo Contrast  Result Date: 05/30/2019 CLINICAL DATA:  Acute onset of shortness of breath. Hypoxemia. EXAM: CT ANGIOGRAPHY CHEST WITH CONTRAST TECHNIQUE: Multidetector CT imaging of the chest was performed using the standard protocol during bolus administration of intravenous contrast. Multiplanar CT image reconstructions and MIPs were obtained to evaluate the vascular anatomy. CONTRAST:  41mL OMNIPAQUE IOHEXOL 350 MG/ML SOLN COMPARISON:  Chest x-ray dated 05/30/2019 FINDINGS: Cardiovascular: No pulmonary emboli. Chronic cardiomegaly. No significant pericardial effusion. Mediastinum/Nodes: No enlarged mediastinal, hilar, or axillary lymph nodes. Thyroid gland, trachea, and esophagus demonstrate no significant findings. Lungs/Pleura: There are extensive bilateral pulmonary infiltrates without effusions. There is peripheral sparing of the pulmonary parenchyma bilaterally suggesting acute pulmonary edema. There is interstitial edema at the lung bases. Upper Abdomen: No acute abnormality. Musculoskeletal: No chest wall abnormality. No acute or significant osseous findings. Review of the MIP images confirms the above findings. IMPRESSION: 1. No pulmonary emboli. 2. Extensive bilateral pulmonary infiltrates without effusions, likely pulmonary edema. 3. Interstitial edema at the lung bases.  Cardiomegaly. 4. Given the acute onset of probable flash pulmonary edema, the possibility of an inferior wall myocardial infarction should be considered. Electronically Signed   By: Lorriane Shire M.D.   On: 05/30/2019 14:44   Dg Chest Port 1  View  Result Date: 05/30/2019 CLINICAL DATA:  68 year old presenting with acute onset of shortness of breath that began earlier today. Patient is tachycardic. Patient awakened with mid chest pain and tightness yesterday but that has subsequently resolved. Current history of hypertension and asthma. EXAM: PORTABLE CHEST 1 VIEW COMPARISON:  04/30/2014. FINDINGS: Cardiac silhouette upper normal in size to slightly enlarged for AP portable technique. Thoracic aorta atherosclerotic. Hilar and mediastinal contours otherwise unremarkable. Diffuse interstitial pulmonary edema with Kerley B lines present diffusely bilaterally. Asymmetric airspace opacities at the RIGHT lung base. Possible small RIGHT pleural effusion. IMPRESSION: 1. Mild CHF, with borderline to mild cardiomegaly and mild diffuse interstitial pulmonary edema. 2. Asymmetric airspace opacities at the RIGHT lung base which may represent asymmetric airspace edema or pneumonia. Possible small RIGHT pleural effusion. Electronically Signed   By: Evangeline Dakin M.D.   On: 05/30/2019 12:37    EKG: Sinus tachycardia, no ST-T wave changes noted. Assessment/Plan Active Problems:   Obese   S/P knee replacement   CHF (congestive heart failure) (HCC)   NSTEMI (non-ST elevated myocardial infarction) ([place])   AKI (acute kidney injury) ([place])   NSTEMI: -Patient presented with acute onset of shortness of breath with productive cough -EKG: No ST-T wave changes noted.  Troponin: Initial 108 and then trended up to 578.,  proBNP is elevated at 243. -Reviewed chest x-ray and CT angiogram results which shows pulmonary edema and mild CHF. -COVID-19 came back negative.  She received Lasix 40 mg IV once in the ED. -Place patient under observation. -Started on Lasix 40 mg IV twice daily, strict input and output, daily weight, monitor electrolytes closely. -Started on aspirin, atorvastatin and low-dose beta-blocker. -Ordered A1c and lipid panel. -Consulted  cardiology-appreciate help-plan for cardiac catheterization.  New onset CHF: -Patient complaining of acute onset of shortness of breath, leg swelling -proBNP is elevated, chest x-ray and CT angios came back positive for pulmonary embolism and mild CHF -Continue Lasix 40 mg IV twice daily -Ordered transthoracic echo and is pending -Cardiology is following  Acute hypoxic respiratory failure -Likely secondary to fluid overload -On  4 L of oxygen via nasal cannula, continuous pulse ox  Hypertension: Blood pressure is stable -Patient currently not on any antihypertensive medication at home. -Started on low-dose Coreg -Monitor blood pressure closely.  AKI: -Likely secondary to dehydration -Avoid nephrotoxic medication and NSAIDs -Monitor kidney function closely  GERD: Stable we will continue Protonix  Arthritis: Started on Tylenol -Avoid NSAIDs due to AKI.  Morbid obesity with BMI of 40: -Patient consulted regarding dietary modification, exercise and weight loss   DVT prophylaxis: Lovenox Code Status: Full code Family Communication:  None present at bedside.  Plan of care discussed with patient in length and he verbalized understanding and agreed with it. Disposition Plan: TBD Consults called: Cardiology  Dr. Percival Spanish  admission status: Observation  Mckinley Jewel MD [facility] Pager [phone]  If 7PM-7AM, please contact night-coverage [email or web address] Password [facility]  05/30/2019, 8:17 PM

## 2019-05-31 ENCOUNTER — Observation Stay (HOSPITAL_COMMUNITY): Payer: Medicare Other

## 2019-05-31 ENCOUNTER — Encounter (HOSPITAL_COMMUNITY)
Admission: EM | Disposition: A | Discharge status: Home / Self Care | Payer: Self-pay | Source: Home / Self Care | Attending: Internal Medicine

## 2019-05-31 DIAGNOSIS — I4891 Unspecified atrial fibrillation: Secondary | ICD-10-CM

## 2019-05-31 DIAGNOSIS — I428 Other cardiomyopathies: Secondary | ICD-10-CM | POA: Diagnosis not present

## 2019-05-31 DIAGNOSIS — Z6839 Body mass index (BMI) 39.0-39.9, adult: Secondary | ICD-10-CM | POA: Diagnosis not present

## 2019-05-31 DIAGNOSIS — I5031 Acute diastolic (congestive) heart failure: Secondary | ICD-10-CM

## 2019-05-31 DIAGNOSIS — I340 Nonrheumatic mitral (valve) insufficiency: Secondary | ICD-10-CM | POA: Diagnosis not present

## 2019-05-31 DIAGNOSIS — L709 Acne, unspecified: Secondary | ICD-10-CM | POA: Diagnosis present

## 2019-05-31 DIAGNOSIS — E0500 Thyrotoxicosis with diffuse goiter without thyrotoxic crisis or storm: Secondary | ICD-10-CM | POA: Diagnosis not present

## 2019-05-31 DIAGNOSIS — E0590 Thyrotoxicosis, unspecified without thyrotoxic crisis or storm: Secondary | ICD-10-CM | POA: Diagnosis not present

## 2019-05-31 DIAGNOSIS — Z20828 Contact with and (suspected) exposure to other viral communicable diseases: Secondary | ICD-10-CM | POA: Diagnosis not present

## 2019-05-31 DIAGNOSIS — I509 Heart failure, unspecified: Secondary | ICD-10-CM

## 2019-05-31 DIAGNOSIS — I361 Nonrheumatic tricuspid (valve) insufficiency: Secondary | ICD-10-CM | POA: Diagnosis not present

## 2019-05-31 DIAGNOSIS — I110 Hypertensive heart disease with heart failure: Secondary | ICD-10-CM | POA: Diagnosis not present

## 2019-05-31 DIAGNOSIS — R7989 Other specified abnormal findings of blood chemistry: Secondary | ICD-10-CM | POA: Diagnosis not present

## 2019-05-31 DIAGNOSIS — I5021 Acute systolic (congestive) heart failure: Secondary | ICD-10-CM | POA: Diagnosis not present

## 2019-05-31 DIAGNOSIS — R0602 Shortness of breath: Secondary | ICD-10-CM | POA: Diagnosis not present

## 2019-05-31 DIAGNOSIS — I429 Cardiomyopathy, unspecified: Secondary | ICD-10-CM | POA: Diagnosis not present

## 2019-05-31 DIAGNOSIS — J9601 Acute respiratory failure with hypoxia: Secondary | ICD-10-CM

## 2019-05-31 DIAGNOSIS — I5020 Unspecified systolic (congestive) heart failure: Secondary | ICD-10-CM

## 2019-05-31 DIAGNOSIS — Z8349 Family history of other endocrine, nutritional and metabolic diseases: Secondary | ICD-10-CM | POA: Diagnosis not present

## 2019-05-31 DIAGNOSIS — Z832 Family history of diseases of the blood and blood-forming organs and certain disorders involving the immune mechanism: Secondary | ICD-10-CM | POA: Diagnosis not present

## 2019-05-31 DIAGNOSIS — I248 Other forms of acute ischemic heart disease: Secondary | ICD-10-CM | POA: Diagnosis not present

## 2019-05-31 DIAGNOSIS — H409 Unspecified glaucoma: Secondary | ICD-10-CM | POA: Diagnosis present

## 2019-05-31 DIAGNOSIS — M170 Bilateral primary osteoarthritis of knee: Secondary | ICD-10-CM | POA: Diagnosis present

## 2019-05-31 DIAGNOSIS — Z8249 Family history of ischemic heart disease and other diseases of the circulatory system: Secondary | ICD-10-CM | POA: Diagnosis not present

## 2019-05-31 DIAGNOSIS — N179 Acute kidney failure, unspecified: Secondary | ICD-10-CM | POA: Diagnosis not present

## 2019-05-31 DIAGNOSIS — Z96653 Presence of artificial knee joint, bilateral: Secondary | ICD-10-CM | POA: Diagnosis present

## 2019-05-31 DIAGNOSIS — I9589 Other hypotension: Secondary | ICD-10-CM | POA: Diagnosis not present

## 2019-05-31 DIAGNOSIS — J45909 Unspecified asthma, uncomplicated: Secondary | ICD-10-CM | POA: Diagnosis present

## 2019-05-31 DIAGNOSIS — K219 Gastro-esophageal reflux disease without esophagitis: Secondary | ICD-10-CM | POA: Diagnosis present

## 2019-05-31 DIAGNOSIS — I420 Dilated cardiomyopathy: Secondary | ICD-10-CM | POA: Diagnosis not present

## 2019-05-31 DIAGNOSIS — E876 Hypokalemia: Secondary | ICD-10-CM | POA: Diagnosis not present

## 2019-05-31 DIAGNOSIS — I480 Paroxysmal atrial fibrillation: Secondary | ICD-10-CM | POA: Diagnosis not present

## 2019-05-31 HISTORY — PX: RIGHT/LEFT HEART CATH AND CORONARY ANGIOGRAPHY: CATH118266

## 2019-05-31 LAB — POCT I-STAT EG7
Acid-Base Excess: 2 mmol/L (ref 0.0–2.0)
Bicarbonate: 26.4 mmol/L (ref 20.0–28.0)
Calcium, Ion: 1.2 mmol/L (ref 1.15–1.40)
HCT: 39 % (ref 36.0–46.0)
Hemoglobin: 13.3 g/dL (ref 12.0–15.0)
O2 Saturation: 65 %
Potassium: 3.3 mmol/L — ABNORMAL LOW (ref 3.5–5.1)
Sodium: 137 mmol/L (ref 135–145)
TCO2: 28 mmol/L (ref 22–32)
pCO2, Ven: 41.9 mmHg — ABNORMAL LOW (ref 44.0–60.0)
pH, Ven: 7.409 (ref 7.250–7.430)
pO2, Ven: 34 mmHg (ref 32.0–45.0)

## 2019-05-31 LAB — IRON AND TIBC
Iron: 131 ug/dL (ref 28–170)
Saturation Ratios: 34 % — ABNORMAL HIGH (ref 10.4–31.8)
TIBC: 385 ug/dL (ref 250–450)
UIBC: 254 ug/dL

## 2019-05-31 LAB — HEPARIN LEVEL (UNFRACTIONATED)
Heparin Unfractionated: 0.13 IU/mL — ABNORMAL LOW (ref 0.30–0.70)
Heparin Unfractionated: 0.16 IU/mL — ABNORMAL LOW (ref 0.30–0.70)

## 2019-05-31 LAB — BASIC METABOLIC PANEL
Anion gap: 13 (ref 5–15)
BUN: 13 mg/dL (ref 8–23)
CO2: 23 mmol/L (ref 22–32)
Calcium: 8.6 mg/dL — ABNORMAL LOW (ref 8.9–10.3)
Chloride: 98 mmol/L (ref 98–111)
Creatinine, Ser: 1.04 mg/dL — ABNORMAL HIGH (ref 0.44–1.00)
GFR calc Af Amer: >60 mL/min (ref >60)
GFR calc non Af Amer: 56 mL/min — ABNORMAL LOW (ref >60)
Glucose, Bld: 137 mg/dL — ABNORMAL HIGH (ref 70–99)
Potassium: 3.6 mmol/L (ref 3.5–5.1)
Sodium: 134 mmol/L — ABNORMAL LOW (ref 135–145)

## 2019-05-31 LAB — CBC
HCT: 39.9 % (ref 36.0–46.0)
Hemoglobin: 13.3 g/dL (ref 12.0–15.0)
MCH: 28.2 pg (ref 26.0–34.0)
MCHC: 33.3 g/dL (ref 30.0–36.0)
MCV: 84.5 fL (ref 80.0–100.0)
Platelets: 267 10*3/uL (ref 150–400)
RBC: 4.72 MIL/uL (ref 3.87–5.11)
RDW: 14.1 % (ref 11.5–15.5)
WBC: 12 10*3/uL — ABNORMAL HIGH (ref 4.0–10.5)
nRBC: 0 % (ref 0.0–0.2)

## 2019-05-31 LAB — ECHOCARDIOGRAM COMPLETE
Height: 61 in
Weight: 3449.76 oz

## 2019-05-31 LAB — POCT I-STAT 7, (LYTES, BLD GAS, ICA,H+H)
Acid-Base Excess: 1 mmol/L (ref 0.0–2.0)
Bicarbonate: 25.3 mmol/L (ref 20.0–28.0)
Calcium, Ion: 1.19 mmol/L (ref 1.15–1.40)
HCT: 38 % (ref 36.0–46.0)
Hemoglobin: 12.9 g/dL (ref 12.0–15.0)
O2 Saturation: 96 %
Potassium: 3.3 mmol/L — ABNORMAL LOW (ref 3.5–5.1)
Sodium: 136 mmol/L (ref 135–145)
TCO2: 26 mmol/L (ref 22–32)
pCO2 arterial: 38 mmHg (ref 32.0–48.0)
pH, Arterial: 7.432 (ref 7.350–7.450)
pO2, Arterial: 77 mmHg — ABNORMAL LOW (ref 83.0–108.0)

## 2019-05-31 LAB — HEMOGLOBIN A1C
Hgb A1c MFr Bld: 5.4 % (ref 4.8–5.6)
Mean Plasma Glucose: 108 mg/dL

## 2019-05-31 LAB — HIV ANTIBODY (ROUTINE TESTING W REFLEX): HIV Screen 4th Generation wRfx: NONREACTIVE

## 2019-05-31 LAB — T4, FREE: Free T4: 1.86 ng/dL — ABNORMAL HIGH (ref 0.61–1.12)

## 2019-05-31 SURGERY — RIGHT/LEFT HEART CATH AND CORONARY ANGIOGRAPHY
Anesthesia: LOCAL

## 2019-05-31 MED ORDER — VERAPAMIL HCL 2.5 MG/ML IV SOLN
INTRAVENOUS | Status: DC | PRN
  Administered 2019-05-31: 10 mL via INTRA_ARTERIAL

## 2019-05-31 MED ORDER — HEPARIN SODIUM (PORCINE) 1000 UNIT/ML IJ SOLN
INTRAMUSCULAR | Status: AC
  Filled 2019-05-31: qty 1

## 2019-05-31 MED ORDER — METOPROLOL TARTRATE 5 MG/5ML IV SOLN
INTRAVENOUS | Status: AC
  Filled 2019-05-31: qty 5

## 2019-05-31 MED ORDER — SODIUM CHLORIDE 0.9% FLUSH
3.0000 mL | INTRAVENOUS | Status: DC | PRN
  Administered 2019-06-01: 3 mL via INTRAVENOUS
  Filled 2019-05-31: qty 3

## 2019-05-31 MED ORDER — FENTANYL CITRATE (PF) 100 MCG/2ML IJ SOLN
INTRAMUSCULAR | Status: AC
  Filled 2019-05-31: qty 2

## 2019-05-31 MED ORDER — ASPIRIN 81 MG PO CHEW: 81.0000 mg | CHEWABLE_TABLET | ORAL | Status: DC

## 2019-05-31 MED ORDER — DILTIAZEM LOAD VIA INFUSION
10.0000 mg | Freq: Once | INTRAVENOUS | Status: AC
  Administered 2019-05-31: 10:00:00 10 mg via INTRAVENOUS
  Filled 2019-05-31: qty 10

## 2019-05-31 MED ORDER — VERAPAMIL HCL 2.5 MG/ML IV SOLN
INTRAVENOUS | Status: AC
  Filled 2019-05-31: qty 2

## 2019-05-31 MED ORDER — CARVEDILOL 6.25 MG PO TABS
6.2500 mg | ORAL_TABLET | Freq: Two times a day (BID) | ORAL | Status: DC
  Administered 2019-05-31 – 2019-06-02 (×5): 6.25 mg via ORAL
  Filled 2019-05-31 (×5): qty 1

## 2019-05-31 MED ORDER — SODIUM CHLORIDE 0.9% FLUSH
3.0000 mL | Freq: Two times a day (BID) | INTRAVENOUS | Status: DC
  Administered 2019-06-03 – 2019-06-04 (×2): 3 mL via INTRAVENOUS

## 2019-05-31 MED ORDER — FENTANYL CITRATE (PF) 100 MCG/2ML IJ SOLN
INTRAMUSCULAR | Status: DC | PRN
  Administered 2019-05-31: 25 ug via INTRAVENOUS

## 2019-05-31 MED ORDER — METOPROLOL TARTRATE 12.5 MG HALF TABLET
12.5000 mg | ORAL_TABLET | Freq: Four times a day (QID) | ORAL | Status: DC
  Filled 2019-05-31: qty 1

## 2019-05-31 MED ORDER — SODIUM CHLORIDE 0.9 % IV SOLN: 250.0000 mL | INTRAVENOUS | Status: DC | PRN

## 2019-05-31 MED ORDER — MIDAZOLAM HCL 2 MG/2ML IJ SOLN
INTRAMUSCULAR | Status: AC
  Filled 2019-05-31: qty 2

## 2019-05-31 MED ORDER — METOPROLOL TARTRATE 5 MG/5ML IV SOLN
5.0000 mg | Freq: Once | INTRAVENOUS | Status: AC
  Administered 2019-05-31: 5 mg via INTRAVENOUS

## 2019-05-31 MED ORDER — MIDAZOLAM HCL 2 MG/2ML IJ SOLN
INTRAMUSCULAR | Status: DC | PRN
  Administered 2019-05-31: 1 mg via INTRAVENOUS

## 2019-05-31 MED ORDER — IOHEXOL 350 MG/ML SOLN
INTRAVENOUS | Status: DC | PRN
  Administered 2019-05-31: 110 mL

## 2019-05-31 MED ORDER — LIDOCAINE HCL (PF) 1 % IJ SOLN
INTRAMUSCULAR | Status: DC | PRN
  Administered 2019-05-31 (×2): 2 mL
  Administered 2019-05-31: 15 mL

## 2019-05-31 MED ORDER — HEPARIN (PORCINE) IN NACL 1000-0.9 UT/500ML-% IV SOLN
INTRAVENOUS | Status: DC | PRN
  Administered 2019-05-31 (×3): 500 mL

## 2019-05-31 MED ORDER — SODIUM CHLORIDE 0.9% FLUSH: 3.0000 mL | INTRAVENOUS | Status: DC | PRN

## 2019-05-31 MED ORDER — HEPARIN (PORCINE) IN NACL 1000-0.9 UT/500ML-% IV SOLN
INTRAVENOUS | Status: AC
  Filled 2019-05-31: qty 1000

## 2019-05-31 MED ORDER — SODIUM CHLORIDE 0.9% FLUSH
3.0000 mL | Freq: Two times a day (BID) | INTRAVENOUS | Status: DC
  Administered 2019-06-01 – 2019-06-03 (×4): 3 mL via INTRAVENOUS

## 2019-05-31 MED ORDER — LIDOCAINE HCL (PF) 1 % IJ SOLN
INTRAMUSCULAR | Status: AC
  Filled 2019-05-31: qty 30

## 2019-05-31 MED ORDER — SODIUM CHLORIDE 0.9 % IV SOLN
INTRAVENOUS | Status: DC
  Administered 2019-05-31: 14:00:00 via INTRAVENOUS

## 2019-05-31 MED ORDER — DILTIAZEM HCL-DEXTROSE 100-5 MG/100ML-% IV SOLN (PREMIX)
5.0000 mg/h | INTRAVENOUS | Status: DC
  Administered 2019-05-31 – 2019-06-01 (×2): 5 mg/h via INTRAVENOUS
  Filled 2019-05-31 (×3): qty 100

## 2019-05-31 MED ORDER — HEPARIN (PORCINE) IN NACL 1000-0.9 UT/500ML-% IV SOLN
INTRAVENOUS | Status: AC
  Filled 2019-05-31: qty 500

## 2019-05-31 MED ORDER — HEPARIN (PORCINE) 25000 UT/250ML-% IV SOLN
1500.0000 [IU]/h | INTRAVENOUS | Status: DC
  Administered 2019-05-31: 1300 [IU]/h via INTRAVENOUS
  Filled 2019-05-31: qty 250

## 2019-05-31 MED ORDER — SODIUM CHLORIDE 0.9 % IV SOLN
250.0000 mL | INTRAVENOUS | Status: DC | PRN
  Administered 2019-06-03: 17:00:00 250 mL via INTRAVENOUS

## 2019-05-31 MED ORDER — SODIUM CHLORIDE 0.9 % WEIGHT BASED INFUSION
1.0000 mL/kg/h | INTRAVENOUS | Status: AC
  Administered 2019-05-31: 1 mL/kg/h via INTRAVENOUS

## 2019-05-31 SURGICAL SUPPLY — 19 items
CATH 5FR JL3.5 JR4 ANG PIG MP (CATHETERS) ×1 IMPLANT
CATH BALLN WEDGE 5F 110CM (CATHETERS) ×1 IMPLANT
CATH INFINITI 5 FR 3DRC (CATHETERS) ×1 IMPLANT
CATH INFINITI 5FR JL4 (CATHETERS) ×1 IMPLANT
CLOSURE MYNX CONTROL 5F (Vascular Products) ×1 IMPLANT
DEVICE RAD COMP TR BAND LRG (VASCULAR PRODUCTS) ×1 IMPLANT
GLIDESHEATH SLEND SS 6F .021 (SHEATH) ×1 IMPLANT
GUIDEWIRE INQWIRE 1.5J.035X260 (WIRE) IMPLANT
INQWIRE 1.5J .035X260CM (WIRE) ×2
KIT HEART LEFT (KITS) ×2 IMPLANT
PACK CARDIAC CATHETERIZATION (CUSTOM PROCEDURE TRAY) ×2 IMPLANT
SHEATH GLIDE SLENDER 4/5FR (SHEATH) ×2 IMPLANT
SHEATH PINNACLE 5F 10CM (SHEATH) ×1 IMPLANT
SHEATH PROBE COVER 6X72 (BAG) ×2 IMPLANT
SYR MEDRAD MARK 7 150ML (SYRINGE) ×2 IMPLANT
TRANSDUCER W/STOPCOCK (MISCELLANEOUS) ×2 IMPLANT
TUBING CIL FLEX 10 FLL-RA (TUBING) ×2 IMPLANT
WIRE EMERALD 3MM-J .035X150CM (WIRE) ×1 IMPLANT
WIRE HI TORQ VERSACORE-J 145CM (WIRE) ×1 IMPLANT

## 2019-05-31 NOTE — Progress Notes (Signed)
Called d/t MEWS-4 for HR-130s, BP-92/54. Pt went into Afib with RVR-130s-150s @ 0215.  MD paged by bedside RN-EKG and 5mg  metoprolol IV ordered and given at 0229. Pt's HR still 140s after IV metoprolol so 12.5mg  metoprolol PO ordered. This was not given d/t pt c/o being lightheaded. MD updated and wants to monitor pt and recheck BP at 0600. Please increase VS per RED MEWS guidelines and call RRT if assistance needed.

## 2019-05-31 NOTE — Progress Notes (Signed)
ANTICOAGULATION CONSULT NOTE - [place] for Heparin Indication: Atrial fibrillation  Allergies  Allergen Reactions  . Hydrochlorothiazide Other (See Comments) and Hypertension    Wildly fluctuates B/P low-to-high, then high-to-low, in a matter of minutes  . Hydrocodone-Acetaminophen Nausea And Vomiting  . Other Nausea And Vomiting and Other (See Comments)    Stronger pain meds (post-op) caused dehydration and constipation    Patient Measurements: Height: 5\' 1"  (154.9 cm) Weight: 215 lb 9.8 oz (97.8 kg)(pt unable to stand high heart rate) IBW/kg (Calculated) : 47.8 Heparin Dosing Weight: 71.3 kg  Vital Signs: BP: 122/60 (09/09 1521) Pulse Rate: 124 (09/09 1521)  Labs: Recent Labs    05/30/19 1134 05/30/19 1449 05/30/19 1852 05/31/19 0027 05/31/19 0851  HGB 14.6  --  14.1 13.3  --   HCT 45.2  --  43.5 39.9  --   PLT 292  --  298 267  --   HEPARINUNFRC  --   --   --  0.13* 0.16*  CREATININE 1.04*  --  1.07* 1.04*  --   TROPONINIHS 108* 578*  --   --   --     Estimated Creatinine Clearance: 57.7 mL/min (A) (by C-G formula based on SCr of 1.04 mg/dL (H)).   Medical History: Past Medical History:  Diagnosis Date  . 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 Asthma    hx of - PT STATES MILD - NO LONGER REQUIRES INHALERS  . Colitis    mild   . GERD (gastroesophageal reflux disease)   . H/O hiatal hernia   . Hypertension    hx of 10 years ago ; PT'S BLOOD PRESSURE RECENTLY ELEVATED WHILE IN HOSP FOR SURG- NOT ON ANY B/P MEDS    Medications:  Scheduled:  . aspirin  81 mg Oral Daily  . atorvastatin  80 mg Oral q1800  . carvedilol  6.25 mg Oral BID WC  . furosemide  40 mg Intravenous BID  . pantoprazole  40 mg Oral Daily  . sodium chloride flush  3 mL Intravenous Q12H   Infusions:  . diltiazem (CARDIZEM) infusion 5 mg/hr (05/31/19 0939)  . heparin Stopped (05/31/19  1339)    Assessment: 68 yo F admitted for NSTEMI. CTA with no PE. Possibility of inferior wall MI. No AC PTA. Pharmacy consulted to dose heparin. Pt also in AFib with RVR.  Pt is S/P cath, which showed normal coronary anatomy, low normal to mildly reduced LV function (limited by a fib), normal right heart pressures, upper normal LV filling pressure; with plan for medical management. Pharmacy consult to re-start heparin 8 hrs after sheath pulled (per RN, sheath pulled at 15:18 this afternoon); no signs of bleeding post-cath.  Heparin infusion had been increased to 1300 units/hr earlier today, due to subtherapeutic heparin level of 0.16 units/ml this AM, prior to stopping at 13:39 for procedure; CBC stable.  Goal of Therapy:  Heparin level 0.3-0.7 units/ml Monitor platelets by anticoagulation protocol: Yes   Plan:  Restart heparin at 1300 units/hr 8 hrs after sheath removal (restart at 23:18 PM) Check 6-hr heparin level, then daily Monitor CBC daily Monitor for signs/symptoms of bleeding  Gillermina Hu, PharmD, BCPS, [facility] Clinical Pharmacist 05/31/2019

## 2019-05-31 NOTE — Progress Notes (Signed)
BP 100/64 manually. HR sustaining 130s-140s, going up to 150s. Pt resting in bed with no c/o voiced. MD on call paged.

## 2019-05-31 NOTE — Interval H&P Note (Signed)
History and Physical Interval Note:  05/31/2019 2:17 PM  Christina [name]  has presented today for surgery, with the diagnosis of hf.  The various methods of treatment have been discussed with the patient and family. After consideration of risks, benefits and other options for treatment, the patient has consented to  Procedure(s): RIGHT/LEFT HEART CATH AND CORONARY ANGIOGRAPHY (N/A) as a surgical intervention.  The patient's history has been reviewed, patient examined, no change in status, stable for surgery.  I have reviewed the patient's chart and labs.  Questions were answered to the patient's satisfaction.   Cath Lab Visit (complete for each Cath Lab visit)  Clinical Evaluation Leading to the Procedure:   ACS: Yes.    Non-ACS:    Anginal Classification: CCS III  Anti-ischemic medical therapy: No Therapy  Non-Invasive Test Results: No non-invasive testing performed  Prior CABG: No previous CABG        Collier Salina [facility] 05/31/2019 2:17 PM'

## 2019-05-31 NOTE — Progress Notes (Signed)
ANTICOAGULATION CONSULT NOTE - Initial Consult  Pharmacy Consult for Heparin Indication: chest pain/ACS  Allergies  Allergen Reactions  . Hydrochlorothiazide Other (See Comments) and Hypertension    Wildly fluctuates B/P low-to-high, then high-to-low, in a matter of minutes  . Hydrocodone-Acetaminophen Nausea And Vomiting  . Other Nausea And Vomiting and Other (See Comments)    Stronger pain meds (post-op) caused dehydration and constipation    Patient Measurements: Height: 5\' 1"  (154.9 cm) Weight: 216 lb 9.6 oz (98.2 kg) IBW/kg (Calculated) : 47.8 Heparin Dosing Weight: 71.3 kg  Vital Signs: Temp: 98.5 F (36.9 C) (09/08 2326) Temp Source: Oral (09/08 2326) BP: 114/62 (09/08 2326) Pulse Rate: 110 (09/08 2326)  Labs: Recent Labs    05/30/19 1134 05/30/19 1449 05/30/19 1852 05/31/19 0027  HGB 14.6  --  14.1 13.3  HCT 45.2  --  43.5 39.9  PLT 292  --  298 267  HEPARINUNFRC  --   --   --  0.13*  CREATININE 1.04*  --  1.07* 1.04*  TROPONINIHS 108* 578*  --   --     Estimated Creatinine Clearance: 57.9 mL/min (A) (by C-G formula based on SCr of 1.04 mg/dL (H)).   Medical History: Past Medical History:  Diagnosis Date  . 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 Asthma    hx of - PT STATES MILD - NO LONGER REQUIRES INHALERS  . Colitis    mild   . GERD (gastroesophageal reflux disease)   . H/O hiatal hernia   . Hypertension    hx of 10 years ago ; PT'S BLOOD PRESSURE RECENTLY ELEVATED WHILE IN HOSP FOR SURG- NOT ON ANY B/P MEDS    Medications:  Scheduled:  . aspirin  81 mg Oral Daily  . atorvastatin  80 mg Oral q1800  . carvedilol  3.125 mg Oral BID WC  . furosemide  40 mg Intravenous BID  . pantoprazole  40 mg Oral Daily   Infusions:  . heparin 1,100 Units/hr (05/31/19 0146)    Assessment: 89 yoF admitted for NSTEMI. CTA with no PE. Possibility of inferior wall MI. No AC PTA. Pharmacy  consulted to dose heparin. Cardiac cath planned once pt stable.  CBC wnl. No bleeding noted. Scr 1.04.   9/9 AM update: initial heparin level low, no issues per RN.  Goal of Therapy:  Heparin level 0.3-0.7 units/ml Monitor platelets by anticoagulation protocol: Yes   Plan:  Inc heparin to 1100 units/hr 1000 heparin level  Narda Bonds, PharmD, BCPS Clinical Pharmacist Phone: [phone]

## 2019-05-31 NOTE — H&P (View-Only) (Signed)
Progress Note  Patient Name: Christina [name] Date of Encounter: 05/31/2019  Primary Cardiologist: Minus Breeding, MD   Subjective   A bit anxious. Asking when she can go home. Worried about her dog at home, with no one to care for it. In rapid afib, but fairly asymptomatic. No palpitations, CP or dyspnea currently.   Inpatient Medications    Scheduled Meds:  aspirin  81 mg Oral Daily   atorvastatin  80 mg Oral q1800   carvedilol  6.25 mg Oral BID WC   diltiazem  10 mg Intravenous Once   furosemide  40 mg Intravenous BID   metoprolol tartrate       pantoprazole  40 mg Oral Daily   Continuous Infusions:  diltiazem (CARDIZEM) infusion     heparin 1,100 Units/hr (05/31/19 0146)   PRN Meds: guaiFENesin-dextromethorphan, ondansetron **OR** ondansetron (ZOFRAN) IV   Vital Signs    Vitals:   05/31/19 0415 05/31/19 0511 05/31/19 0512 05/31/19 0614  BP: 92/60  98/74 100/64  Pulse: (!) 142  (!) 126 (!) 124  Resp:      Temp:      TempSrc:      SpO2:      Weight:  97.8 kg    Height:        Intake/Output Summary (Last 24 hours) at 05/31/2019 0758 Last data filed at 05/31/2019 0400 Gross per 24 hour  Intake 482.71 ml  Output 1700 ml  Net -1217.29 ml   Last 3 Weights 05/31/2019 05/30/2019 06/05/2014  Weight (lbs) 215 lb 9.8 oz 216 lb 9.6 oz 200 lb  Weight (kg) 97.8 kg 98.249 kg 90.719 kg      Telemetry    Atrial fibrillation w/  RVR 130s-140s - Personally Reviewed  ECG    New atrial fibrillation w/ RVR - Personally Reviewed  Physical Exam   GEN: obese middle aged WF, a bit anxious but no acute distress.   Neck: No JVD Cardiac: irregularly irregular rhythm, tachy rate, no murmurs, rubs, or gallops.  Respiratory: Clear to auscultation bilaterally. GI: obese abdomen, soft, nontender, non-distended  MS: obese LEs, bilateral knee replacement scars, no pitting edema Neuro:  Nonfocal  Psych: Normal affect   Labs    High Sensitivity Troponin:   Recent Labs  Lab  05/30/19 1134 05/30/19 1449  TROPONINIHS 108* 578*      Chemistry Recent Labs  Lab 05/30/19 1134 05/30/19 1852 05/31/19 0027  NA 139  --  134*  K 4.2  --  3.6  CL 107  --  98  CO2 19*  --  23  GLUCOSE 130*  --  137*  BUN 12  --  13  CREATININE 1.04* 1.07* 1.04*  CALCIUM 9.1  --  8.6*  PROT 6.7  --   --   ALBUMIN 3.5  --   --   AST 20  --   --   ALT 21  --   --   ALKPHOS 102  --   --   BILITOT 0.3  --   --   GFRNONAA 56* 54* 56*  GFRAA >60 >60 >60  ANIONGAP 13  --  13     Hematology Recent Labs  Lab 05/30/19 1134 05/30/19 1852 05/31/19 0027  WBC 9.1 10.9* 12.0*  RBC 5.26* 5.10 4.72  HGB 14.6 14.1 13.3  HCT 45.2 43.5 39.9  MCV 85.9 85.3 84.5  MCH 27.8 27.6 28.2  MCHC 32.3 32.4 33.3  RDW 14.1 14.2 14.1  PLT 292  298 267    BNP Recent Labs  Lab 05/30/19 1134  BNP 243.5*     DDimer No results for input(s): DDIMER in the last 168 hours.   Radiology    Ct Angio Chest Pe W And/or Wo Contrast  Result Date: 05/30/2019 CLINICAL DATA:  Acute onset of shortness of breath. Hypoxemia. EXAM: CT ANGIOGRAPHY CHEST WITH CONTRAST TECHNIQUE: Multidetector CT imaging of the chest was performed using the standard protocol during bolus administration of intravenous contrast. Multiplanar CT image reconstructions and MIPs were obtained to evaluate the vascular anatomy. CONTRAST:  32mL OMNIPAQUE IOHEXOL 350 MG/ML SOLN COMPARISON:  Chest x-ray dated 05/30/2019 FINDINGS: Cardiovascular: No pulmonary emboli. Chronic cardiomegaly. No significant pericardial effusion. Mediastinum/Nodes: No enlarged mediastinal, hilar, or axillary lymph nodes. Thyroid gland, trachea, and esophagus demonstrate no significant findings. Lungs/Pleura: There are extensive bilateral pulmonary infiltrates without effusions. There is peripheral sparing of the pulmonary parenchyma bilaterally suggesting acute pulmonary edema. There is interstitial edema at the lung bases. Upper Abdomen: No acute abnormality.  Musculoskeletal: No chest wall abnormality. No acute or significant osseous findings. Review of the MIP images confirms the above findings. IMPRESSION: 1. No pulmonary emboli. 2. Extensive bilateral pulmonary infiltrates without effusions, likely pulmonary edema. 3. Interstitial edema at the lung bases.  Cardiomegaly. 4. Given the acute onset of probable flash pulmonary edema, the possibility of an inferior wall myocardial infarction should be considered. Electronically Signed   By: Lorriane Shire M.D.   On: 05/30/2019 14:44   Dg Chest Port 1 View  Result Date: 05/30/2019 CLINICAL DATA:  68 year old presenting with acute onset of shortness of breath that began earlier today. Patient is tachycardic. Patient awakened with mid chest pain and tightness yesterday but that has subsequently resolved. Current history of hypertension and asthma. EXAM: PORTABLE CHEST 1 VIEW COMPARISON:  04/30/2014. FINDINGS: Cardiac silhouette upper normal in size to slightly enlarged for AP portable technique. Thoracic aorta atherosclerotic. Hilar and mediastinal contours otherwise unremarkable. Diffuse interstitial pulmonary edema with Kerley B lines present diffusely bilaterally. Asymmetric airspace opacities at the RIGHT lung base. Possible small RIGHT pleural effusion. IMPRESSION: 1. Mild CHF, with borderline to mild cardiomegaly and mild diffuse interstitial pulmonary edema. 2. Asymmetric airspace opacities at the RIGHT lung base which may represent asymmetric airspace edema or pneumonia. Possible small RIGHT pleural effusion. Electronically Signed   By: Evangeline Dakin M.D.   On: 05/30/2019 12:37    Cardiac Studies   2D Echo  1. The left ventricle has moderate-severely reduced systolic function, with an ejection fraction of 30-35%. The cavity size was normal. There is moderately increased left ventricular wall thickness. Left ventricular diastolic function could not be evaluated secondary to atrial fibrillation. Left  ventricular diffuse hypokinesis. 2. Severe akinesis of the left ventricular, entire anterior wall, anteroseptal wall and apical segment. 3. The right ventricle has normal systolic function. The cavity was normal. There is no increase in right ventricular wall thickness. 4. The mitral valve is abnormal. Mild thickening of the mitral valve leaflet. 5. The aortic valve is tricuspid. Mild sclerosis of the aortic valve. No stenosis of the aortic valve. 6. The aorta is abnormal unless otherwise noted. 7. There is mild dilatation of the ascending aorta measuring 38 mm. 8. The inferior vena cava was normal in size with <50% respiratory variability. 9. The tricuspid valve is grossly normal. Left Ventricle: The left ventricle has moderate-severely reduced systolic function, with an ejection fraction of 30-35%. The cavity size was normal. There is moderately increased left ventricular wall  thickness. Left ventricular diastolic function could not be evaluated secondary to atrial fibrillation. Left ventricular diffuse hypokinesis. Severe akinesis of the left ventricular, entire anterior wall, anteroseptal wall and apical segment. Right Ventricle: The right ventricle has normal systolic function. The cavity was normal. There is no increase in right ventricular wall thickness.   Patient Profile     Christina [name] is a 68yo F with a hx of HTN, GERD, remote asthma and arthritis who presented to the ED after acute onset of SOB which occurred while shopping at [employer]>>ED w/u c/w acute CHF, elevated hs Troponin and now new onset atrial fibrillation w/ RVR.   Assessment & Plan    1. Acute CHF: presented w/ acute dyspnea>>pulmonary edema. BNP 243 on admit.  Symptomatic improvement w/ IV Lasix. No resting dyspnea.   1.7L out in UOP overnight  Scr and electrolytes stable  Echo today to help assess cardiac function  Obesity makes volume assessment difficult. Would recommend R/LHC  Continue Diuretics, Strict  I/Os and daily weights  Daily BMPs to closely monitor renal function and electrolytes   2. Atrial Fibrillation w/ RVR: new onset. Sinus tach on admit. Went into rapid afib w/ rates in the 130s-140s on 05/31/19 around 0200. Initially given IV metoprolol w/ little improvement. Sustained rates in the 130s-140s currently.  Fairly asymptomatic  Hemodynamically stable w/ SBP in the 120s  Primary team has ordered IV cardizem  Concern for hyperthyroidism>>TSH extremely low at <0.010. F/u Free T3/T4 labs pending. Sister has Graves disease, per pt report. If diagnosis of hyperthyroidism is made,  blocker therapy would be drug of choice for rate control of afib.  2D echo pending  CHA2DS2 VASc score is at least 3 for CHF, Age 18-74 and female sex. This patients CHA2DS2-VASc Score and unadjusted Ischemic Stroke Rate (% per year) is equal to 3.2 % stroke rate/year from a score of 3.  Will continue IV heparin for now. If afib persists, would recommend initiation of oral anticoagulation, once all potential invasive procedures have been completed. Will likely need R/LHC.    3. Elevated Hs Troponin: 108>>578. Denies CP but new onset CHF and rapid afib.  2D echo pending to assess LVF and wall motion  ? Demand ischemia 2/2 acute CHF and rapid afib vs true type I NSTEMI/ACS  Continue IV heparin  Plan R/LHC prior to d/c  4. Abnormal TFTs: Concern for hyperthyroidism>>TSH extremely low at <0.010. F/u Free T3/T4 labs pending. Sister has Graves disease, per pt report.   If hyperthyroidism, will likely need treatment w/ methimazole and  blocker for rate control of atrial arrhthymia  5. AKI: SCr 1.0 (previously 0.6).  Dark urine noted. UA pending  Monitor while diuresing    For questions or updates, please contact [place] Please consult [email or web address] for contact info under     Signed, Lyda Jester, PA-C  05/31/2019, 7:58 AM    History and all data above reviewed.  Patient examined.   I agree with the findings as above.  The patient exam reveals [id number]  ,  Lungs: Few basilar crackles  ,  Abd: Positive bowel sounds, no rebound no guarding, Ext Mild edema  .  All available labs, radiology testing, previous records reviewed. Agree with documented assessment and plan.   DILATED CARDIOMYOPATHY: Interesting in that she is also noted to have atrial fib.  He has a history of [place] in her family.  Could this be CM related caused by the fib or could the fib be secondary to  the cardiomyopathy and could the cardiomyopathy be secondary to [facility].  We do need to rule out CAD and I will plan right and left cath tomorrow.  She will need med titration but this will be difficult with low BP.  In the meantime, we need to work up the possibility of [place].  I will talk to Dr. Broadus John about genetic screening.  I will order FE studies.  Of note liver enzymes are normal.    Plan right and left cath today.  On Coreg.  Unable to titrate meds otherwise.    Jeneen Rinks Avalina Benko  11:16 AM  05/31/2019

## 2019-05-31 NOTE — Progress Notes (Signed)
  Echocardiogram 2D Echocardiogram has been performed.  Christina [name] 05/31/2019, 8:53 AM

## 2019-05-31 NOTE — Progress Notes (Addendum)
Progress Note  Patient Name: Christina [name] Date of Encounter: 05/31/2019  Primary Cardiologist: Minus Breeding, MD   Subjective   A bit anxious. Asking when she can go home. Worried about her dog at home, with no one to care for it. In rapid afib, but fairly asymptomatic. No palpitations, CP or dyspnea currently.   Inpatient Medications    Scheduled Meds:  aspirin  81 mg Oral Daily   atorvastatin  80 mg Oral q1800   carvedilol  6.25 mg Oral BID WC   diltiazem  10 mg Intravenous Once   furosemide  40 mg Intravenous BID   metoprolol tartrate       pantoprazole  40 mg Oral Daily   Continuous Infusions:  diltiazem (CARDIZEM) infusion     heparin 1,100 Units/hr (05/31/19 0146)   PRN Meds: guaiFENesin-dextromethorphan, ondansetron **OR** ondansetron (ZOFRAN) IV   Vital Signs    Vitals:   05/31/19 0415 05/31/19 0511 05/31/19 0512 05/31/19 0614  BP: 92/60  98/74 100/64  Pulse: (!) 142  (!) 126 (!) 124  Resp:      Temp:      TempSrc:      SpO2:      Weight:  97.8 kg    Height:        Intake/Output Summary (Last 24 hours) at 05/31/2019 0758 Last data filed at 05/31/2019 0400 Gross per 24 hour  Intake 482.71 ml  Output 1700 ml  Net -1217.29 ml   Last 3 Weights 05/31/2019 05/30/2019 06/05/2014  Weight (lbs) 215 lb 9.8 oz 216 lb 9.6 oz 200 lb  Weight (kg) 97.8 kg 98.249 kg 90.719 kg      Telemetry    Atrial fibrillation w/  RVR 130s-140s - Personally Reviewed  ECG    New atrial fibrillation w/ RVR - Personally Reviewed  Physical Exam   GEN: obese middle aged WF, a bit anxious but no acute distress.   Neck: No JVD Cardiac: irregularly irregular rhythm, tachy rate, no murmurs, rubs, or gallops.  Respiratory: Clear to auscultation bilaterally. GI: obese abdomen, soft, nontender, non-distended  MS: obese LEs, bilateral knee replacement scars, no pitting edema Neuro:  Nonfocal  Psych: Normal affect   Labs    High Sensitivity Troponin:   Recent Labs  Lab  05/30/19 1134 05/30/19 1449  TROPONINIHS 108* 578*      Chemistry Recent Labs  Lab 05/30/19 1134 05/30/19 1852 05/31/19 0027  NA 139  --  134*  K 4.2  --  3.6  CL 107  --  98  CO2 19*  --  23  GLUCOSE 130*  --  137*  BUN 12  --  13  CREATININE 1.04* 1.07* 1.04*  CALCIUM 9.1  --  8.6*  PROT 6.7  --   --   ALBUMIN 3.5  --   --   AST 20  --   --   ALT 21  --   --   ALKPHOS 102  --   --   BILITOT 0.3  --   --   GFRNONAA 56* 54* 56*  GFRAA >60 >60 >60  ANIONGAP 13  --  13     Hematology Recent Labs  Lab 05/30/19 1134 05/30/19 1852 05/31/19 0027  WBC 9.1 10.9* 12.0*  RBC 5.26* 5.10 4.72  HGB 14.6 14.1 13.3  HCT 45.2 43.5 39.9  MCV 85.9 85.3 84.5  MCH 27.8 27.6 28.2  MCHC 32.3 32.4 33.3  RDW 14.1 14.2 14.1  PLT 292  298 267    BNP Recent Labs  Lab 05/30/19 1134  BNP 243.5*     DDimer No results for input(s): DDIMER in the last 168 hours.   Radiology    Ct Angio Chest Pe W And/or Wo Contrast  Result Date: 05/30/2019 CLINICAL DATA:  Acute onset of shortness of breath. Hypoxemia. EXAM: CT ANGIOGRAPHY CHEST WITH CONTRAST TECHNIQUE: Multidetector CT imaging of the chest was performed using the standard protocol during bolus administration of intravenous contrast. Multiplanar CT image reconstructions and MIPs were obtained to evaluate the vascular anatomy. CONTRAST:  90mL OMNIPAQUE IOHEXOL 350 MG/ML SOLN COMPARISON:  Chest x-ray dated 05/30/2019 FINDINGS: Cardiovascular: No pulmonary emboli. Chronic cardiomegaly. No significant pericardial effusion. Mediastinum/Nodes: No enlarged mediastinal, hilar, or axillary lymph nodes. Thyroid gland, trachea, and esophagus demonstrate no significant findings. Lungs/Pleura: There are extensive bilateral pulmonary infiltrates without effusions. There is peripheral sparing of the pulmonary parenchyma bilaterally suggesting acute pulmonary edema. There is interstitial edema at the lung bases. Upper Abdomen: No acute abnormality.  Musculoskeletal: No chest wall abnormality. No acute or significant osseous findings. Review of the MIP images confirms the above findings. IMPRESSION: 1. No pulmonary emboli. 2. Extensive bilateral pulmonary infiltrates without effusions, likely pulmonary edema. 3. Interstitial edema at the lung bases.  Cardiomegaly. 4. Given the acute onset of probable flash pulmonary edema, the possibility of an inferior wall myocardial infarction should be considered. Electronically Signed   By: Lorriane Shire M.D.   On: 05/30/2019 14:44   Dg Chest Port 1 View  Result Date: 05/30/2019 CLINICAL DATA:  68 year old presenting with acute onset of shortness of breath that began earlier today. Patient is tachycardic. Patient awakened with mid chest pain and tightness yesterday but that has subsequently resolved. Current history of hypertension and asthma. EXAM: PORTABLE CHEST 1 VIEW COMPARISON:  04/30/2014. FINDINGS: Cardiac silhouette upper normal in size to slightly enlarged for AP portable technique. Thoracic aorta atherosclerotic. Hilar and mediastinal contours otherwise unremarkable. Diffuse interstitial pulmonary edema with Kerley B lines present diffusely bilaterally. Asymmetric airspace opacities at the RIGHT lung base. Possible small RIGHT pleural effusion. IMPRESSION: 1. Mild CHF, with borderline to mild cardiomegaly and mild diffuse interstitial pulmonary edema. 2. Asymmetric airspace opacities at the RIGHT lung base which may represent asymmetric airspace edema or pneumonia. Possible small RIGHT pleural effusion. Electronically Signed   By: Evangeline Dakin M.D.   On: 05/30/2019 12:37    Cardiac Studies   2D Echo  1. The left ventricle has moderate-severely reduced systolic function, with an ejection fraction of 30-35%. The cavity size was normal. There is moderately increased left ventricular wall thickness. Left ventricular diastolic function could not be evaluated secondary to atrial fibrillation. Left  ventricular diffuse hypokinesis. 2. Severe akinesis of the left ventricular, entire anterior wall, anteroseptal wall and apical segment. 3. The right ventricle has normal systolic function. The cavity was normal. There is no increase in right ventricular wall thickness. 4. The mitral valve is abnormal. Mild thickening of the mitral valve leaflet. 5. The aortic valve is tricuspid. Mild sclerosis of the aortic valve. No stenosis of the aortic valve. 6. The aorta is abnormal unless otherwise noted. 7. There is mild dilatation of the ascending aorta measuring 38 mm. 8. The inferior vena cava was normal in size with <50% respiratory variability. 9. The tricuspid valve is grossly normal. Left Ventricle: The left ventricle has moderate-severely reduced systolic function, with an ejection fraction of 30-35%. The cavity size was normal. There is moderately increased left ventricular wall  thickness. Left ventricular diastolic function could not be evaluated secondary to atrial fibrillation. Left ventricular diffuse hypokinesis. Severe akinesis of the left ventricular, entire anterior wall, anteroseptal wall and apical segment. Right Ventricle: The right ventricle has normal systolic function. The cavity was normal. There is no increase in right ventricular wall thickness.   Patient Profile     Christina [name] is a 68yo F with a hx of HTN, GERD, remote asthma and arthritis who presented to the ED after acute onset of SOB which occurred while shopping at [employer]>>ED w/u c/w acute CHF, elevated hs Troponin and now new onset atrial fibrillation w/ RVR.   Assessment & Plan    1. Acute CHF: presented w/ acute dyspnea>>pulmonary edema. BNP 243 on admit.  Symptomatic improvement w/ IV Lasix. No resting dyspnea.   1.7L out in UOP overnight  Scr and electrolytes stable  Echo today to help assess cardiac function  Obesity makes volume assessment difficult. Would recommend R/LHC  Continue Diuretics, Strict  I/Os and daily weights  Daily BMPs to closely monitor renal function and electrolytes   2. Atrial Fibrillation w/ RVR: new onset. Sinus tach on admit. Went into rapid afib w/ rates in the 130s-140s on 05/31/19 around 0200. Initially given IV metoprolol w/ little improvement. Sustained rates in the 130s-140s currently.  Fairly asymptomatic  Hemodynamically stable w/ SBP in the 120s  Primary team has ordered IV cardizem  Concern for hyperthyroidism>>TSH extremely low at <0.010. F/u Free T3/T4 labs pending. Sister has Graves disease, per pt report. If diagnosis of hyperthyroidism is made,  blocker therapy would be drug of choice for rate control of afib.  2D echo pending  CHA2DS2 VASc score is at least 3 for CHF, Age 48-74 and female sex. This patients CHA2DS2-VASc Score and unadjusted Ischemic Stroke Rate (% per year) is equal to 3.2 % stroke rate/year from a score of 3.  Will continue IV heparin for now. If afib persists, would recommend initiation of oral anticoagulation, once all potential invasive procedures have been completed. Will likely need R/LHC.    3. Elevated Hs Troponin: 108>>578. Denies CP but new onset CHF and rapid afib.  2D echo pending to assess LVF and wall motion  ? Demand ischemia 2/2 acute CHF and rapid afib vs true type I NSTEMI/ACS  Continue IV heparin  Plan R/LHC prior to d/c  4. Abnormal TFTs: Concern for hyperthyroidism>>TSH extremely low at <0.010. F/u Free T3/T4 labs pending. Sister has Graves disease, per pt report.   If hyperthyroidism, will likely need treatment w/ methimazole and  blocker for rate control of atrial arrhthymia  5. AKI: SCr 1.0 (previously 0.6).  Dark urine noted. UA pending  Monitor while diuresing    For questions or updates, please contact [place] Please consult [email or web address] for contact info under     Signed, Lyda Jester, PA-C  05/31/2019, 7:58 AM    History and all data above reviewed.  Patient examined.   I agree with the findings as above.  The patient exam reveals [id number]  ,  Lungs: Few basilar crackles  ,  Abd: Positive bowel sounds, no rebound no guarding, Ext Mild edema  .  All available labs, radiology testing, previous records reviewed. Agree with documented assessment and plan.   DILATED CARDIOMYOPATHY: Interesting in that she is also noted to have atrial fib.  He has a history of [place] in her family.  Could this be CM related caused by the fib or could the fib be secondary to  the cardiomyopathy and could the cardiomyopathy be secondary to [facility].  We do need to rule out CAD and I will plan right and left cath tomorrow.  She will need med titration but this will be difficult with low BP.  In the meantime, we need to work up the possibility of [place].  I will talk to Dr. Broadus John about genetic screening.  I will order FE studies.  Of note liver enzymes are normal.    Plan right and left cath today.  On Coreg.  Unable to titrate meds otherwise.    Jeneen Rinks Ismael Karge  11:16 AM  05/31/2019

## 2019-05-31 NOTE — Progress Notes (Signed)
Pt states she no longer feels "lightheaded". No other c/o at this time.

## 2019-05-31 NOTE — Progress Notes (Signed)
ANTICOAGULATION CONSULT NOTE - [place] for Heparin Indication: chest pain/ACS  Allergies  Allergen Reactions  . Hydrochlorothiazide Other (See Comments) and Hypertension    Wildly fluctuates B/P low-to-high, then high-to-low, in a matter of minutes  . Hydrocodone-Acetaminophen Nausea And Vomiting  . Other Nausea And Vomiting and Other (See Comments)    Stronger pain meds (post-op) caused dehydration and constipation    Patient Measurements: Height: 5\' 1"  (154.9 cm) Weight: 215 lb 9.8 oz (97.8 kg)(pt unable to stand high heart rate) IBW/kg (Calculated) : 47.8 Heparin Dosing Weight: 71.3 kg  Vital Signs: Temp: 98.5 F (36.9 C) (09/08 2326) Temp Source: Oral (09/08 2326) BP: 110/90 (09/09 0846) Pulse Rate: 86 (09/09 0846)  Labs: Recent Labs    05/30/19 1134 05/30/19 1449 05/30/19 1852 05/31/19 0027 05/31/19 0851  HGB 14.6  --  14.1 13.3  --   HCT 45.2  --  43.5 39.9  --   PLT 292  --  298 267  --   HEPARINUNFRC  --   --   --  0.13* 0.16*  CREATININE 1.04*  --  1.07* 1.04*  --   TROPONINIHS 108* 578*  --   --   --     Estimated Creatinine Clearance: 57.7 mL/min (A) (by C-G formula based on SCr of 1.04 mg/dL (H)).   Medical History: Past Medical History:  Diagnosis Date  . 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 Asthma    hx of - PT STATES MILD - NO LONGER REQUIRES INHALERS  . Colitis    mild   . GERD (gastroesophageal reflux disease)   . H/O hiatal hernia   . Hypertension    hx of 10 years ago ; PT'S BLOOD PRESSURE RECENTLY ELEVATED WHILE IN HOSP FOR SURG- NOT ON ANY B/P MEDS    Medications:  Scheduled:  . aspirin  81 mg Oral Daily  . atorvastatin  80 mg Oral q1800  . carvedilol  6.25 mg Oral BID WC  . furosemide  40 mg Intravenous BID  . metoprolol tartrate      . pantoprazole  40 mg Oral Daily   Infusions:  . diltiazem (CARDIZEM) infusion 5 mg/hr  (05/31/19 0939)  . heparin 1,100 Units/hr (05/31/19 0146)    Assessment: 72 yoF admitted for NSTEMI. CTA with no PE. Possibility of inferior wall MI. No AC PTA. Pharmacy consulted to dose heparin. Cardiac cath planned once pt stable, pt also in AFib RVR.  Repeat heparin level drawn early but remains subtherapeutic, CBC stable.  Goal of Therapy:  Heparin level 0.3-0.7 units/ml Monitor platelets by anticoagulation protocol: Yes   Plan:  -Increase heparin to 1300 units/hr -Recheck heparin level in 6h  Arrie Senate, PharmD, BCPS Clinical Pharmacist [phone] Please check AMION for all [place] numbers 05/31/2019

## 2019-05-31 NOTE — Progress Notes (Signed)
Pt's still in afib with HR up to 140's after IV metoprolol given. MD paged.

## 2019-05-31 NOTE — Progress Notes (Signed)
CCMD notified this nurse that pt has gone into afib with rate up to 150's. Pt is asymptomatic. BP 102/71, O2 sat 100% 3L. NT obtaining EKG.  MD on call paged.

## 2019-05-31 NOTE — Progress Notes (Signed)
Pt's HR sustaining around 130's and BP 92/54 manually. States she feels "lightheaded". MD notified and order to hold po lopressor for now and recheck BP at 0600 for re-eval.

## 2019-05-31 NOTE — Progress Notes (Signed)
PROGRESS NOTE   SHALAYNA CRAIL  [id number]    DOB: 05/27/1951    DOA: 05/30/2019  PCP: Mateo Flow, MD   I have briefly reviewed patients previous medical records in [facility].  Chief Complaint  Patient presents with   Shortness of Breath   Cough    Brief Narrative:  68 year old female with PMH of HTN, GERD, remote asthma, arthritis, chronic DOE, longstanding intermittent palpitations without diagnosis of A. fib, family history of hemochromatosis (brother), Graves' disease (sister) presented to ED on 05/30/2019 due to acute dyspnea and chest pressure, noted to be hypoxic in ED, tachycardic up to 150s and admitted for suspected acute systolic CHF, chest pressure with elevated cardiac enzymes and concern for NSTEMI, cardiology consulted, started on IV heparin drip, plan for eventual right and left heart catheterization.  Overnight of admission developed new onset A. fib with RVR, initiated IV Cardizem drip along with IV heparin.  Suppressed TSH, checking free T3 and free T4.  Assessment & Plan:   Active Problems:   Obese   S/P knee replacement   CHF (congestive heart failure) (HCC)   NSTEMI (non-ST elevated myocardial infarction) ([place])   AKI (acute kidney injury) ([place])   Acute pulmonary edema (HCC)   Acute respiratory failure with hypoxia (HCC)   Acute CHF ([place])   Acute CHF  Unknown type in the absence of an echo.  TTE done this morning and report pending.  BMP 243 on admission.  This may not be fully accurate due to morbid obesity.  Cardiology consulted, initiated on IV furosemide 40 mg twice daily on admission.  -1.2 L since admission, weight down by 1 pound, cough improved but remains volume overloaded.  Continue same dose of IV Lasix.  Continue carvedilol 6.25 twice daily.  Consider Entresto pending echo results.  A. fib with RVR  Presented initially with sinus tachycardia on admission.  Overnight of admission developed A. fib with RVR up to 130s-140s with  associated hypotension and dizziness.  No significant response to IV metoprolol.  IV Cardizem initiated 9/9 however if EF low, will need to consider amiodarone rather than CCB's.  TSH suppressed, checking free T3 and free T4.  Does have family history of Graves' disease.  Follow TTE results.  TTE done this morning.  CHA2DS2 VASc score is at least 3, currently on IV heparin infusion but will likely need to change to DOAC pending procedures.  Reports prior history of longstanding intermittent palpitations which could be related to A. Fib.  Chest discomfort and elevated troponin  HS troponin I08 > 578.  Had chest discomfort PTA.  Could be demand ischemia from new onset acute CHF, hypoxia, A. fib with RVR but cannot rule out ACS/NSTEMI  Continue aspirin, carvedilol, statins and IV heparin.  Cardiology follow-up appreciated and plan R/L heart cath prior to discharge.  Acute respiratory failure with hypoxia  Present on admission.  Oxygen saturation 88% on room air and briefly on 15 L/min oxygen.  Secondary to decompensated CHF.  CTA chest negative for PE.  SARS coronavirus 2 testing negative.  Resolved.  Essential hypertension  Appeared to be controlled without medications at home.  Currently controlled on carvedilol.  Abnormal TFTs/suppressed TSH  TSH <0.010.  Follow-up free T3 and free T4  Concern for hyperthyroid given family history of Graves' disease and current A. fib with RVR.  Clinically without features of hyperthyroid except A. Fib  GERD without esophagitis  Continue pantoprazole  Morbid obesity/Body mass index is 40.74  kg/m.    DVT prophylaxis: IV heparin infusion Code Status: Full Family Communication: None at bed Disposition: DC home pending clinical improvement.  Patient was admitted as observation.  However she has ongoing CHF features despite IV Lasix which will be continued, TTE pending, has new onset A. fib with RVR on IV Cardizem that requires  control on oral medications prior to discharge, plans for right/left heart cath when clinically improved.  Thereby she will stay at least overnight tonight if not longer.  Hence changed to inpatient on 9/9   Consultants:  Cardiology  Procedures:  None  Antimicrobials:  None   Subjective: Patient interviewed and examined along with her RN in room.  Reports chronic history of intermittent but regular palpitations.  Reportedly had both knee surgeries done 5 years ago when her EKG was reviewed and she was told not to have A. fib.  Cough significantly improved and asking if she can have her cardiac cath soon and return home soon because her dog will not eat with anyone else except her.  Currently without chest pain, palpitations, dizziness or lightheadedness.  Objective:  Vitals:   05/31/19 0832 05/31/19 0846 05/31/19 0901 05/31/19 0932  BP: 95/68 110/90 105/86 122/81  Pulse: (!) 107 86 (!) 101 76  Resp:      Temp:      TempSrc:      SpO2: 99% 99% 100% 100%  Weight:      Height:        Examination:  General exam: Pleasant middle-aged female, moderately built and morbidly obese sitting up comfortably in bed without distress. Respiratory system: Slightly diminished breath sounds in the bases with few basal crackles.  Rest of lung fields clear to auscultation without wheezing or rhonchi.  No increased work of breathing. Cardiovascular system: S1 & S2 heard, irregularly irregular and tachycardic. No JVD, murmurs, rubs, gallops or clicks.  Trace bilateral ankle edema.  Telemetry personally reviewed: Was in sinus tachycardia up to 110s until 2 AM last night and then transitioned to A. fib with RVR persistently in the 130s. Gastrointestinal system: Abdomen is nondistended, soft and nontender. No organomegaly or masses felt. Normal bowel sounds heard. Central nervous system: Alert and oriented. No focal neurological deficits. Extremities: Symmetric 5 x 5 power. Skin: No rashes, lesions or  ulcers Psychiatry: Judgement and insight appear normal. Mood & affect appropriate.     Data Reviewed: I have personally reviewed following labs and imaging studies  CBC: Recent Labs  Lab 05/30/19 1134 05/30/19 1852 05/31/19 0027  WBC 9.1 10.9* 12.0*  NEUTROABS 7.4  --   --   HGB 14.6 14.1 13.3  HCT 45.2 43.5 39.9  MCV 85.9 85.3 84.5  PLT 292 298 [id number]   Basic Metabolic Panel: Recent Labs  Lab 05/30/19 1134 05/30/19 1852 05/31/19 0027  NA 139  --  134*  K 4.2  --  3.6  CL 107  --  98  CO2 19*  --  23  GLUCOSE 130*  --  137*  BUN 12  --  13  CREATININE 1.04* 1.07* 1.04*  CALCIUM 9.1  --  8.6*  MG  --  1.7  --   PHOS  --  4.4  --    Liver Function Tests: Recent Labs  Lab 05/30/19 1134  AST 20  ALT 21  ALKPHOS 102  BILITOT 0.3  PROT 6.7  ALBUMIN 3.5    Cardiac Enzymes: No results for input(s): CKTOTAL, CKMB, CKMBINDEX, TROPONINI in the last 168  hours.  CBG: No results for input(s): GLUCAP in the last 168 hours.  Recent Results (from the past 240 hour(s))  SARS Coronavirus 2 [facility] order, Performed in [facility] hospital lab) Nasopharyngeal Nasopharyngeal Swab     Status: None   Collection Time: 05/30/19 11:31 AM   Specimen: Nasopharyngeal Swab  Result Value Ref Range Status   SARS Coronavirus 2 NEGATIVE NEGATIVE Final    Comment: (NOTE) If result is NEGATIVE SARS-CoV-2 target nucleic acids are NOT DETECTED. The SARS-CoV-2 RNA is generally detectable in upper and lower  respiratory specimens during the acute phase of infection. The lowest  concentration of SARS-CoV-2 viral copies this assay can detect is 250  copies / mL. A negative result does not preclude SARS-CoV-2 infection  and should not be used as the sole basis for treatment or other  patient management decisions.  A negative result may occur with  improper specimen collection / handling, submission of specimen other  than nasopharyngeal swab, presence of viral mutation(s) within the  areas  targeted by this assay, and inadequate number of viral copies  (<250 copies / mL). A negative result must be combined with clinical  observations, patient history, and epidemiological information. If result is POSITIVE SARS-CoV-2 target nucleic acids are DETECTED. The SARS-CoV-2 RNA is generally detectable in upper and lower  respiratory specimens dur ing the acute phase of infection.  Positive  results are indicative of active infection with SARS-CoV-2.  Clinical  correlation with patient history and other diagnostic information is  necessary to determine patient infection status.  Positive results do  not rule out bacterial infection or co-infection with other viruses. If result is PRESUMPTIVE POSTIVE SARS-CoV-2 nucleic acids MAY BE PRESENT.   A presumptive positive result was obtained on the submitted specimen  and confirmed on repeat testing.  While 2019 novel coronavirus  (SARS-CoV-2) nucleic acids may be present in the submitted sample  additional confirmatory testing may be necessary for epidemiological  and / or clinical management purposes  to differentiate between  SARS-CoV-2 and other Sarbecovirus currently known to infect humans.  If clinically indicated additional testing with an alternate test  methodology [phone]) is advised. The SARS-CoV-2 RNA is generally  detectable in upper and lower respiratory sp ecimens during the acute  phase of infection. The expected result is Negative. Fact Sheet for Patients:  StrictlyIdeas.no Fact Sheet for Healthcare Providers: BankingDealers.co.za This test is not yet approved or cleared by the [place] FDA and has been authorized for detection and/or diagnosis of SARS-CoV-2 by FDA under an Emergency Use Authorization (EUA).  This EUA will remain in effect (meaning this test can be used) for the duration of the COVID-19 declaration under Section 564(b)(1) of the Act, 21 U.S.C. section  360bbb-3(b)(1), unless the authorization is terminated or revoked sooner. Performed at [facility], [place] [address] [place], [place] [address]          Radiology Studies: Ct Angio Chest Pe W And/or Wo Contrast  Result Date: 05/30/2019 CLINICAL DATA:  Acute onset of shortness of breath. Hypoxemia. EXAM: CT ANGIOGRAPHY CHEST WITH CONTRAST TECHNIQUE: Multidetector CT imaging of the chest was performed using the standard protocol during bolus administration of intravenous contrast. Multiplanar CT image reconstructions and MIPs were obtained to evaluate the vascular anatomy. CONTRAST:  27mL OMNIPAQUE IOHEXOL 350 MG/ML SOLN COMPARISON:  Chest x-ray dated 05/30/2019 FINDINGS: Cardiovascular: No pulmonary emboli. Chronic cardiomegaly. No significant pericardial effusion. Mediastinum/Nodes: No enlarged mediastinal, hilar, or axillary lymph nodes. Thyroid gland,  trachea, and esophagus demonstrate no significant findings. Lungs/Pleura: There are extensive bilateral pulmonary infiltrates without effusions. There is peripheral sparing of the pulmonary parenchyma bilaterally suggesting acute pulmonary edema. There is interstitial edema at the lung bases. Upper Abdomen: No acute abnormality. Musculoskeletal: No chest wall abnormality. No acute or significant osseous findings. Review of the MIP images confirms the above findings. IMPRESSION: 1. No pulmonary emboli. 2. Extensive bilateral pulmonary infiltrates without effusions, likely pulmonary edema. 3. Interstitial edema at the lung bases.  Cardiomegaly. 4. Given the acute onset of probable flash pulmonary edema, the possibility of an inferior wall myocardial infarction should be considered. Electronically Signed   By: Lorriane Shire M.D.   On: 05/30/2019 14:44   Dg Chest Port 1 View  Result Date: 05/30/2019 CLINICAL DATA:  69 year old presenting with acute onset of shortness of breath that began earlier today. Patient is tachycardic. Patient awakened with  mid chest pain and tightness yesterday but that has subsequently resolved. Current history of hypertension and asthma. EXAM: PORTABLE CHEST 1 VIEW COMPARISON:  04/30/2014. FINDINGS: Cardiac silhouette upper normal in size to slightly enlarged for AP portable technique. Thoracic aorta atherosclerotic. Hilar and mediastinal contours otherwise unremarkable. Diffuse interstitial pulmonary edema with Kerley B lines present diffusely bilaterally. Asymmetric airspace opacities at the RIGHT lung base. Possible small RIGHT pleural effusion. IMPRESSION: 1. Mild CHF, with borderline to mild cardiomegaly and mild diffuse interstitial pulmonary edema. 2. Asymmetric airspace opacities at the RIGHT lung base which may represent asymmetric airspace edema or pneumonia. Possible small RIGHT pleural effusion. Electronically Signed   By: Evangeline Dakin M.D.   On: 05/30/2019 12:37        Scheduled Meds:  aspirin  81 mg Oral Daily   atorvastatin  80 mg Oral q1800   carvedilol  6.25 mg Oral BID WC   furosemide  40 mg Intravenous BID   metoprolol tartrate       pantoprazole  40 mg Oral Daily   Continuous Infusions:  diltiazem (CARDIZEM) infusion 5 mg/hr (05/31/19 0939)   heparin 1,100 Units/hr (05/31/19 0146)     LOS: 0 days     Vernell Leep, MD, FACP, [facility]. [facility]  To contact the attending provider between 7A-7P or the covering provider during after hours 7P-7A, please log into the web site [email or web address] and access using universal [place] password for that web site. If you do not have the password, please call the hospital operator.  05/31/2019, 10:07 AM

## 2019-06-01 ENCOUNTER — Encounter (HOSPITAL_COMMUNITY): Payer: Self-pay | Admitting: Cardiology

## 2019-06-01 DIAGNOSIS — I429 Cardiomyopathy, unspecified: Secondary | ICD-10-CM

## 2019-06-01 DIAGNOSIS — E876 Hypokalemia: Secondary | ICD-10-CM

## 2019-06-01 DIAGNOSIS — I480 Paroxysmal atrial fibrillation: Secondary | ICD-10-CM

## 2019-06-01 DIAGNOSIS — E0590 Thyrotoxicosis, unspecified without thyrotoxic crisis or storm: Secondary | ICD-10-CM

## 2019-06-01 LAB — BASIC METABOLIC PANEL
Anion gap: 12 (ref 5–15)
BUN: 15 mg/dL (ref 8–23)
CO2: 24 mmol/L (ref 22–32)
Calcium: 8.7 mg/dL — ABNORMAL LOW (ref 8.9–10.3)
Chloride: 100 mmol/L (ref 98–111)
Creatinine, Ser: 1.06 mg/dL — ABNORMAL HIGH (ref 0.44–1.00)
GFR calc Af Amer: >60 mL/min (ref >60)
GFR calc non Af Amer: 55 mL/min — ABNORMAL LOW (ref >60)
Glucose, Bld: 97 mg/dL (ref 70–99)
Potassium: 3.2 mmol/L — ABNORMAL LOW (ref 3.5–5.1)
Sodium: 136 mmol/L (ref 135–145)

## 2019-06-01 LAB — MAGNESIUM: Magnesium: 1.6 mg/dL — ABNORMAL LOW (ref 1.7–2.4)

## 2019-06-01 LAB — CBC
HCT: 36.6 % (ref 36.0–46.0)
Hemoglobin: 12.1 g/dL (ref 12.0–15.0)
MCH: 27.8 pg (ref 26.0–34.0)
MCHC: 33.1 g/dL (ref 30.0–36.0)
MCV: 84.1 fL (ref 80.0–100.0)
Platelets: 233 10*3/uL (ref 150–400)
RBC: 4.35 MIL/uL (ref 3.87–5.11)
RDW: 14.2 % (ref 11.5–15.5)
WBC: 7.8 10*3/uL (ref 4.0–10.5)
nRBC: 0 % (ref 0.0–0.2)

## 2019-06-01 LAB — FERRITIN: Ferritin: 54 ng/mL (ref 11–307)

## 2019-06-01 LAB — HEPARIN LEVEL (UNFRACTIONATED): Heparin Unfractionated: 0.18 IU/mL — ABNORMAL LOW (ref 0.30–0.70)

## 2019-06-01 LAB — T3, FREE: T3, Free: 5.5 pg/mL — ABNORMAL HIGH (ref 2.0–4.4)

## 2019-06-01 MED ORDER — POTASSIUM CHLORIDE CRYS ER 20 MEQ PO TBCR
40.0000 meq | EXTENDED_RELEASE_TABLET | Freq: Once | ORAL | Status: AC
  Administered 2019-06-01: 40 meq via ORAL
  Filled 2019-06-01: qty 2

## 2019-06-01 MED ORDER — METHIMAZOLE 5 MG PO TABS
5.0000 mg | ORAL_TABLET | Freq: Two times a day (BID) | ORAL | Status: DC
  Administered 2019-06-01 – 2019-06-04 (×7): 5 mg via ORAL
  Filled 2019-06-01 (×8): qty 1

## 2019-06-01 MED ORDER — FUROSEMIDE 40 MG PO TABS
40.0000 mg | ORAL_TABLET | Freq: Two times a day (BID) | ORAL | Status: DC
  Administered 2019-06-01 – 2019-06-04 (×6): 40 mg via ORAL
  Filled 2019-06-01 (×6): qty 1

## 2019-06-01 MED ORDER — SODIUM CHLORIDE 0.9 % IV BOLUS
250.0000 mL | Freq: Once | INTRAVENOUS | Status: AC
  Administered 2019-06-01: 01:00:00 250 mL via INTRAVENOUS

## 2019-06-01 MED ORDER — SODIUM CHLORIDE 0.9 % IV BOLUS: 500.0000 mL | Freq: Once | INTRAVENOUS | Status: DC

## 2019-06-01 MED ORDER — APIXABAN 5 MG PO TABS
5.0000 mg | ORAL_TABLET | Freq: Two times a day (BID) | ORAL | Status: DC
  Administered 2019-06-01 – 2019-06-04 (×7): 5 mg via ORAL
  Filled 2019-06-01 (×7): qty 1

## 2019-06-01 NOTE — Progress Notes (Signed)
ANTICOAGULATION CONSULT NOTE - [place] for Heparin Indication: Atrial fibrillation, s/p cath  Allergies  Allergen Reactions  . Hydrochlorothiazide Other (See Comments) and Hypertension    Wildly fluctuates B/P low-to-high, then high-to-low, in a matter of minutes  . Hydrocodone-Acetaminophen Nausea And Vomiting  . Other Nausea And Vomiting and Other (See Comments)    Stronger pain meds (post-op) caused dehydration and constipation    Patient Measurements: Height: 5\' 1"  (154.9 cm) Weight: 216 lb 7.9 oz (98.2 kg) IBW/kg (Calculated) : 47.8 Heparin Dosing Weight: 71.3 kg  Vital Signs: Temp: 98.6 F (37 C) (09/10 0334) Temp Source: Oral (09/10 0334) BP: 103/59 (09/10 0334) Pulse Rate: 51 (09/10 0334)  Labs: Recent Labs    05/30/19 1134 05/30/19 1449 05/30/19 1852 05/31/19 0027 05/31/19 0851 05/31/19 1443 05/31/19 1446 06/01/19 0507  HGB 14.6  --  14.1 13.3  --  12.9 13.3 12.1  HCT 45.2  --  43.5 39.9  --  38.0 39.0 36.6  PLT 292  --  298 267  --   --   --  233  HEPARINUNFRC  --   --   --  0.13* 0.16*  --   --  0.18*  CREATININE 1.04*  --  1.07* 1.04*  --   --   --   --   TROPONINIHS 108* 578*  --   --   --   --   --   --     Estimated Creatinine Clearance: 57.9 mL/min (A) (by C-G formula based on SCr of 1.04 mg/dL (H)).   Medical History: Past Medical History:  Diagnosis Date  . 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 Asthma    hx of - PT STATES MILD - NO LONGER REQUIRES INHALERS  . Colitis    mild   . GERD (gastroesophageal reflux disease)   . H/O hiatal hernia   . Hypertension    hx of 10 years ago ; PT'S BLOOD PRESSURE RECENTLY ELEVATED WHILE IN HOSP FOR SURG- NOT ON ANY B/P MEDS    Medications:  Scheduled:  . aspirin  81 mg Oral Daily  . atorvastatin  80 mg Oral q1800  . carvedilol  6.25 mg Oral BID WC  . furosemide  40 mg Intravenous BID  .  pantoprazole  40 mg Oral Daily  . sodium chloride flush  3 mL Intravenous Q12H  . sodium chloride flush  3 mL Intravenous Q12H   Infusions:  . sodium chloride    . diltiazem (CARDIZEM) infusion 5 mg/hr (05/31/19 0939)  . heparin 1,300 Units/hr (05/31/19 2340)    Assessment: 68 yo F admitted for NSTEMI. CTA with no PE. Possibility of inferior wall MI. No AC PTA. Pharmacy consulted to dose heparin. Pt also in AFib with RVR.  Pt is S/P cath, which showed normal coronary anatomy, low normal to mildly reduced LV function (limited by a fib), normal right heart pressures, upper normal LV filling pressure; with plan for medical management. Pharmacy consult to re-start heparin 8 hrs after sheath pulled (per RN, sheath pulled at 15:18 this afternoon); no signs of bleeding post-cath.  Heparin infusion had been increased to 1300 units/hr earlier today, due to subtherapeutic heparin level of 0.16 units/ml this AM, prior to stopping at 13:39 for procedure; CBC stable.  9/10 AM update:  Heparin level remains low No issues per RN  Goal of Therapy:  Heparin  level 0.3-0.7 units/ml Monitor platelets by anticoagulation protocol: Yes   Plan:  Inc heparin to 1500 units/hr Re-check heparin level in 8 hours  Narda Bonds, PharmD, [place] Pharmacist Phone: [phone]

## 2019-06-01 NOTE — Progress Notes (Addendum)
PROGRESS NOTE   JOHANN KIRKHAM  [id number]    DOB: Jun 12, 1951    DOA: 05/30/2019  PCP: Mateo Flow, MD   I have briefly reviewed patients previous medical records in [facility].  Chief Complaint  Patient presents with  . Shortness of Breath  . Cough    Brief Narrative:  68 year old female with PMH of HTN, GERD, remote asthma, arthritis, chronic DOE, longstanding intermittent palpitations without diagnosis of A. fib, family history of hemochromatosis (brother), Graves' disease (sister) presented to ED on 05/30/2019 due to acute dyspnea and chest pressure, noted to be hypoxic in ED, tachycardic up to 150s and admitted for suspected acute systolic CHF, chest pressure with elevated cardiac enzymes and concern for NSTEMI, cardiology consulted, started on IV heparin drip & s/p R/L heart cath with normal coronaries.  Hospital course complicated by A. fib with RVR, remains on IV Cardizem and IV heparin drip.  Also had an episode of fever without clear source, monitoring.  Assessment & Plan:   Active Problems:   Obese   S/P knee replacement   CHF (congestive heart failure) (HCC)   NSTEMI (non-ST elevated myocardial infarction) ([place])   AKI (acute kidney injury) ([place])   Acute pulmonary edema (HCC)   Acute respiratory failure with hypoxia (HCC)   Acute CHF (HCC)   Acute systolic CHF/NICM  Discrepancy in LVEF between TTE (30-35% with WM abnormalities) and cardiac cath (50-55%).  BMP 243 on admission.  This may not be fully accurate due to morbid obesity.  Cardiology consulted, initiated on IV furosemide 40 mg twice daily on admission.  Volume status has improved, cardiology plans to transition to oral Lasix.  Intake/output and weight recording likely inaccurate.  Weight unchanged compared to admission i.e. 216 pounds.  -819 mL since admission.  Continue carvedilol 6.25 twice daily.  Defer decision regarding Entresto to cardiology >plan limited echo after controlled heart rate due to  discrepancy in EF as noted above.  Given family history of hemochromatosis, some concern that her cardiomyopathy could be from that which needs to be worked up versus rate related from A. fib with RVR.  Iron: 131.  No ferritin requested, added to labs.  A. fib with RVR  Presented initially with sinus tachycardia on admission.  Overnight of admission developed A. fib with RVR up to 130s-140s with associated hypotension and dizziness.  No significant response to IV metoprolol.  IV Cardizem initiated 9/9 however if EF low, will need to consider amiodarone rather than CCB's.  TFTs suggestive of hyperthyroidism, see discussion below.  TTE and cath results as noted above.  CHA2DS2 VASc score is at least 3, currently on IV heparin infusion but will likely need to change to DOAC.  Reports prior history of longstanding intermittent palpitations which could be related to A. Fib.  Still has persistent RVR with ventricular rate fluctuating between 100s- 120s despite IV Cardizem drip and carvedilol.?  Amiodarone, however may affect her thyroid function.  Fever  Fever of 101.8 overnight 9/9.  Patient did not realize she had a fever.  No symptomatology to suggest source.  Monitor for now but if she has recurrence then consider further evaluation and management.  Chest discomfort and elevated troponin  HS troponin I08 > 578.  Had chest discomfort PTA.  Could be demand ischemia from new onset acute CHF, hypoxia, A. fib with RVR.  Continue aspirin, carvedilol, statins and IV heparin.  Status post R/L cath 9/9 with normal coronaries.  No recurrence of chest  pain.  Acute respiratory failure with hypoxia  Present on admission.  Oxygen saturation 88% on room air and briefly on 15 L/min oxygen.  Secondary to decompensated CHF.  CTA chest negative for PE.  SARS coronavirus 2 testing negative.  Resolved.  Saturating in the low 90s on room air.  Essential hypertension  Appeared to be controlled  without medications at home.  Currently controlled on carvedilol and IV Cardizem drip.  Hyperthyroidism  TSH <0.010, free T3: 5.5 & free T4: 1.86  Family history of Graves' disease and current A. fib with RVR.  Clinically without features of hyperthyroid except A. Fib  Consider starting Methimazole and close outpatient follow-up including possible endocrinology consultation.  Beta-blockers for heart rate control  I discussed in detail with Dr. Elayne Snare, Endocrinology who reviewed case with me.  He reviewed labs and CTA chest.  No goiter noted on CT chest.  As per recommendations, started methimazole 5 mg twice daily, added thyrotropin receptor antibodies to labs, unable to do I 131 scan at this time due to recent contrast and this can be considered as outpatient, he is happy to follow in the office if patient agreeable.  GERD without esophagitis  Continue pantoprazole  Morbid obesity/Body mass index is 40.91 kg/m.  Hypokalemia  Replace and follow.  Check magnesium.  Family history of hemochromatosis   DVT prophylaxis: IV heparin infusion Code Status: Full Family Communication: None at bedside Disposition: Not medically ready for discharge yet.  DC home pending clinical improvement.   Consultants:  Cardiology  Procedures:   TTE 05/31/2019:  IMPRESSIONS    1. The left ventricle has moderate-severely reduced systolic function, with an ejection fraction of 30-35%. The cavity size was normal. There is moderately increased left ventricular wall thickness. Left ventricular diastolic function could not be  evaluated secondary to atrial fibrillation. Left ventricular diffuse hypokinesis.  2. Severe akinesis of the left ventricular, entire anterior wall, anteroseptal wall and apical segment.  3. The right ventricle has normal systolic function. The cavity was normal. There is no increase in right ventricular wall thickness.  4. The mitral valve is abnormal. Mild thickening  of the mitral valve leaflet.  5. The aortic valve is tricuspid. Mild sclerosis of the aortic valve. No stenosis of the aortic valve.  6. The aorta is abnormal unless otherwise noted.  7. There is mild dilatation of the ascending aorta measuring 38 mm.  8. The inferior vena cava was normal in size with <50% respiratory variability.  9. The tricuspid valve is grossly normal.  SUMMARY   LVEF 30-35%, severe anterior, anteroseptal and apical hypokinesis to akinesis, global hypokinsis, afib with RVR noted, LV filling pressure is elevated, normal RV function, aortic valve sclerosis, mildly dilated ascending aorta to 3.8 cm, IVC suggests elevated RA pressure of 8 mmHg   R/L heart cath and coronary angiography 9/9:  Conclusion  There is mild left ventricular systolic dysfunction.  LV end diastolic pressure is normal.  The left ventricular ejection fraction is 50-55% by visual estimate.   1. Normal coronary anatomy 2. Low normal to mildly reduced LV function. Assessment limited by Afib. 3. Normal right heart pressures  4. LV filling pressure are upper normal 5. Cardiac index 2.37.  Plan: medical management. Would avoid using right radial access in the future.  Antimicrobials:  None   Subjective: Notified of fever overnight but says she did not realize she had a fever.  Mild sore throat but reports that it is related to her cough  which is almost resolved.  No dyspnea or chest pain.  Remains anxious to return home to her dog.  ROS: No headache, earache, productive cough, nausea, vomiting, diarrhea, dysuria, urinary frequency, joint pains or swelling, skin rash, exposure to sick contacts including [place] contacts.  Objective:  Vitals:   06/01/19 0016 06/01/19 0235 06/01/19 0334 06/01/19 0819  BP: (!) 105/55  (!) 103/59 (!) 112/55  Pulse: (!) 101  (!) 51 (!) 104  Resp: 16  18 20   Temp: (!) 101.8 F (38.8 C) 98.6 F (37 C) 98.6 F (37 C) 98.2 F (36.8 C)  TempSrc: Oral Oral  Oral Oral  SpO2: 92%  92% 93%  Weight:   98.2 kg   Height:        Examination:  General exam: Pleasant middle-aged female, moderately built and morbidly obese lying comfortably propped up in bed without distress. Respiratory system: Occasional basal crackles but otherwise clear to auscultation.  No increased work of breathing. Cardiovascular system: S1 and S2 heard, irregularly irregular and mildly tachycardic.  No JVD or murmurs.  Trace bilateral ankle edema.  Telemetry personally reviewed: A. fib with RVR in the 100s-120s. Gastrointestinal system: Abdomen is nondistended, soft and nontender. No organomegaly or masses felt. Normal bowel sounds heard. Central nervous system: Alert and oriented. No focal neurological deficits. Extremities: Symmetric 5 x 5 power. Skin: No rashes, lesions or ulcers Psychiatry: Judgement and insight appear normal. Mood & affect appropriate.     Data Reviewed: I have personally reviewed following labs and imaging studies  CBC: Recent Labs  Lab 05/30/19 1134 05/30/19 1852 05/31/19 0027 05/31/19 1443 05/31/19 1446 06/01/19 0507  WBC 9.1 10.9* 12.0*  --   --  7.8  NEUTROABS 7.4  --   --   --   --   --   HGB 14.6 14.1 13.3 12.9 13.3 12.1  HCT 45.2 43.5 39.9 38.0 39.0 36.6  MCV 85.9 85.3 84.5  --   --  84.1  PLT 292 298 267  --   --  0000000   Basic Metabolic Panel: Recent Labs  Lab 05/30/19 1134 05/30/19 1852 05/31/19 0027 05/31/19 1443 05/31/19 1446 06/01/19 0507  NA 139  --  134* 136 137 136  K 4.2  --  3.6 3.3* 3.3* 3.2*  CL 107  --  98  --   --  100  CO2 19*  --  23  --   --  24  GLUCOSE 130*  --  137*  --   --  97  BUN 12  --  13  --   --  15  CREATININE 1.04* 1.07* 1.04*  --   --  1.06*  CALCIUM 9.1  --  8.6*  --   --  8.7*  MG  --  1.7  --   --   --   --   PHOS  --  4.4  --   --   --   --    Liver Function Tests: Recent Labs  Lab 05/30/19 1134  AST 20  ALT 21  ALKPHOS 102  BILITOT 0.3  PROT 6.7  ALBUMIN 3.5    Cardiac  Enzymes: No results for input(s): CKTOTAL, CKMB, CKMBINDEX, TROPONINI in the last 168 hours.  CBG: No results for input(s): GLUCAP in the last 168 hours.  Recent Results (from the past 240 hour(s))  SARS Coronavirus 2 [facility] order, Performed in [facility] hospital lab) Nasopharyngeal Nasopharyngeal Swab     Status: None  Collection Time: 05/30/19 11:31 AM   Specimen: Nasopharyngeal Swab  Result Value Ref Range Status   SARS Coronavirus 2 NEGATIVE NEGATIVE Final    Comment: (NOTE) If result is NEGATIVE SARS-CoV-2 target nucleic acids are NOT DETECTED. The SARS-CoV-2 RNA is generally detectable in upper and lower  respiratory specimens during the acute phase of infection. The lowest  concentration of SARS-CoV-2 viral copies this assay can detect is 250  copies / mL. A negative result does not preclude SARS-CoV-2 infection  and should not be used as the sole basis for treatment or other  patient management decisions.  A negative result may occur with  improper specimen collection / handling, submission of specimen other  than nasopharyngeal swab, presence of viral mutation(s) within the  areas targeted by this assay, and inadequate number of viral copies  (<250 copies / mL). A negative result must be combined with clinical  observations, patient history, and epidemiological information. If result is POSITIVE SARS-CoV-2 target nucleic acids are DETECTED. The SARS-CoV-2 RNA is generally detectable in upper and lower  respiratory specimens dur ing the acute phase of infection.  Positive  results are indicative of active infection with SARS-CoV-2.  Clinical  correlation with patient history and other diagnostic information is  necessary to determine patient infection status.  Positive results do  not rule out bacterial infection or co-infection with other viruses. If result is PRESUMPTIVE POSTIVE SARS-CoV-2 nucleic acids MAY BE PRESENT.   A presumptive positive result was obtained  on the submitted specimen  and confirmed on repeat testing.  While 2019 novel coronavirus  (SARS-CoV-2) nucleic acids may be present in the submitted sample  additional confirmatory testing may be necessary for epidemiological  and / or clinical management purposes  to differentiate between  SARS-CoV-2 and other Sarbecovirus currently known to infect humans.  If clinically indicated additional testing with an alternate test  methodology [phone]) is advised. The SARS-CoV-2 RNA is generally  detectable in upper and lower respiratory sp ecimens during the acute  phase of infection. The expected result is Negative. Fact Sheet for Patients:  StrictlyIdeas.no Fact Sheet for Healthcare Providers: BankingDealers.co.za This test is not yet approved or cleared by the [place] FDA and has been authorized for detection and/or diagnosis of SARS-CoV-2 by FDA under an Emergency Use Authorization (EUA).  This EUA will remain in effect (meaning this test can be used) for the duration of the COVID-19 declaration under Section 564(b)(1) of the Act, 21 U.S.C. section 360bbb-3(b)(1), unless the authorization is terminated or revoked sooner. Performed at [facility], [place] [address], [place], [place] [address]          Radiology Studies: Ct Angio Chest Pe W And/or Wo Contrast  Result Date: 05/30/2019 CLINICAL DATA:  Acute onset of shortness of breath. Hypoxemia. EXAM: CT ANGIOGRAPHY CHEST WITH CONTRAST TECHNIQUE: Multidetector CT imaging of the chest was performed using the standard protocol during bolus administration of intravenous contrast. Multiplanar CT image reconstructions and MIPs were obtained to evaluate the vascular anatomy. CONTRAST:  74mL OMNIPAQUE IOHEXOL 350 MG/ML SOLN COMPARISON:  Chest x-ray dated 05/30/2019 FINDINGS: Cardiovascular: No pulmonary emboli. Chronic cardiomegaly. No significant pericardial effusion. Mediastinum/Nodes:  No enlarged mediastinal, hilar, or axillary lymph nodes. Thyroid gland, trachea, and esophagus demonstrate no significant findings. Lungs/Pleura: There are extensive bilateral pulmonary infiltrates without effusions. There is peripheral sparing of the pulmonary parenchyma bilaterally suggesting acute pulmonary edema. There is interstitial edema at the lung bases. Upper Abdomen: No acute abnormality. Musculoskeletal: No  chest wall abnormality. No acute or significant osseous findings. Review of the MIP images confirms the above findings. IMPRESSION: 1. No pulmonary emboli. 2. Extensive bilateral pulmonary infiltrates without effusions, likely pulmonary edema. 3. Interstitial edema at the lung bases.  Cardiomegaly. 4. Given the acute onset of probable flash pulmonary edema, the possibility of an inferior wall myocardial infarction should be considered. Electronically Signed   By: Lorriane Shire M.D.   On: 05/30/2019 14:44   Dg Chest Port 1 View  Result Date: 05/30/2019 CLINICAL DATA:  68 year old presenting with acute onset of shortness of breath that began earlier today. Patient is tachycardic. Patient awakened with mid chest pain and tightness yesterday but that has subsequently resolved. Current history of hypertension and asthma. EXAM: PORTABLE CHEST 1 VIEW COMPARISON:  04/30/2014. FINDINGS: Cardiac silhouette upper normal in size to slightly enlarged for AP portable technique. Thoracic aorta atherosclerotic. Hilar and mediastinal contours otherwise unremarkable. Diffuse interstitial pulmonary edema with Kerley B lines present diffusely bilaterally. Asymmetric airspace opacities at the RIGHT lung base. Possible small RIGHT pleural effusion. IMPRESSION: 1. Mild CHF, with borderline to mild cardiomegaly and mild diffuse interstitial pulmonary edema. 2. Asymmetric airspace opacities at the RIGHT lung base which may represent asymmetric airspace edema or pneumonia. Possible small RIGHT pleural effusion.  Electronically Signed   By: Evangeline Dakin M.D.   On: 05/30/2019 12:37        Scheduled Meds: . apixaban  5 mg Oral BID  . aspirin  81 mg Oral Daily  . atorvastatin  80 mg Oral q1800  . carvedilol  6.25 mg Oral BID WC  . furosemide  40 mg Intravenous BID  . pantoprazole  40 mg Oral Daily  . potassium chloride  40 mEq Oral Once  . sodium chloride flush  3 mL Intravenous Q12H  . sodium chloride flush  3 mL Intravenous Q12H   Continuous Infusions: . sodium chloride    . diltiazem (CARDIZEM) infusion 5 mg/hr (05/31/19 0939)     LOS: 1 day     Vernell Leep, MD, FACP, [facility]. [facility]  To contact the attending provider between 7A-7P or the covering provider during after hours 7P-7A, please log into the web site [email or web address] and access using universal [place] password for that web site. If you do not have the password, please call the hospital operator.  06/01/2019, 10:33 AM

## 2019-06-01 NOTE — Plan of Care (Signed)
  Problem: Education: Goal: Ability to demonstrate management of disease process will improve Outcome: Progressing Goal: Ability to verbalize understanding of medication therapies will improve Outcome: Progressing Goal: Individualized Educational Video(s) Outcome: Progressing   Problem: Activity: Goal: Capacity to carry out activities will improve Outcome: Progressing   Problem: Cardiac: Goal: Ability to achieve and maintain adequate cardiopulmonary perfusion will improve Outcome: Progressing   Problem: Education: Goal: Understanding of cardiac disease, CV risk reduction, and recovery process will improve Outcome: Progressing Goal: Individualized Educational Video(s) Outcome: Progressing   Problem: Activity: Goal: Ability to tolerate increased activity will improve Outcome: Progressing   Problem: Cardiac: Goal: Ability to achieve and maintain adequate cardiovascular perfusion will improve Outcome: Progressing   Problem: Health Behavior/Discharge Planning: Goal: Ability to safely manage health-related needs after discharge will improve Outcome: Progressing   Problem: Education: Goal: Knowledge of General Education information will improve Description: Including pain rating scale, medication(s)/side effects and non-pharmacologic comfort measures Outcome: Progressing   Problem: Health Behavior/Discharge Planning: Goal: Ability to manage health-related needs will improve Outcome: Progressing   Problem: Clinical Measurements: Goal: Ability to maintain clinical measurements within normal limits will improve Outcome: Progressing Goal: Will remain free from infection Outcome: Progressing Goal: Diagnostic test results will improve Outcome: Progressing Goal: Respiratory complications will improve Outcome: Progressing Goal: Cardiovascular complication will be avoided Outcome: Progressing   Problem: Activity: Goal: Risk for activity intolerance will decrease Outcome:  Progressing   Problem: Nutrition: Goal: Adequate nutrition will be maintained Outcome: Progressing   Problem: Coping: Goal: Level of anxiety will decrease Outcome: Progressing   Problem: Elimination: Goal: Will not experience complications related to bowel motility Outcome: Progressing Goal: Will not experience complications related to urinary retention Outcome: Progressing   Problem: Pain Managment: Goal: General experience of comfort will improve Outcome: Progressing   Problem: Safety: Goal: Ability to remain free from injury will improve Outcome: Progressing   Problem: Skin Integrity: Goal: Risk for impaired skin integrity will decrease Outcome: Progressing   

## 2019-06-01 NOTE — Progress Notes (Signed)
Removed L AC PIV-  Patient bleeding - held pressure for 3-4 minutes then placed Gauze dressing.   2 Minutes later- Gauze was soaked.

## 2019-06-01 NOTE — Progress Notes (Addendum)
L AC PIV bleeding.  Has Heparin running.   Called Pharmacy.   Spoke to MD earlier- reported possibly stopping Heparin.  Paged MD to find out if still planning to stop.     Placed order for IV team to place another IV in case Heparin is not stopped.   Per pharmacy- will try another IV 1st while waiting for MD to return    Pharmacy returned call- Per MD will be stopping  Heparin and starting Eliquis.   Will hold off on getting 2nd IV.

## 2019-06-01 NOTE — Progress Notes (Addendum)
Progress Note  Patient Name: MAPLE MOWAT Date of Encounter: 06/01/2019  Primary Cardiologist: Minus Breeding, MD   Subjective   Feeling better today. Appears less anxious but still eager to return home to her dog, Josph Macho.   Inpatient Medications    Scheduled Meds:  aspirin  81 mg Oral Daily   atorvastatin  80 mg Oral q1800   carvedilol  6.25 mg Oral BID WC   furosemide  40 mg Intravenous BID   pantoprazole  40 mg Oral Daily   sodium chloride flush  3 mL Intravenous Q12H   sodium chloride flush  3 mL Intravenous Q12H   Continuous Infusions:  sodium chloride     diltiazem (CARDIZEM) infusion 5 mg/hr (05/31/19 0939)   heparin 1,500 Units/hr (06/01/19 0753)   PRN Meds: sodium chloride, guaiFENesin-dextromethorphan, ondansetron **OR** ondansetron (ZOFRAN) IV, sodium chloride flush   Vital Signs    Vitals:   06/01/19 0016 06/01/19 0235 06/01/19 0334 06/01/19 0819  BP: (!) 105/55  (!) 103/59 (!) 112/55  Pulse: (!) 101  (!) 51 (!) 104  Resp: 16  18 20   Temp: (!) 101.8 F (38.8 C) 98.6 F (37 C) 98.6 F (37 C) 98.2 F (36.8 C)  TempSrc: Oral Oral Oral Oral  SpO2: 92%  92% 93%  Weight:   98.2 kg   Height:        Intake/Output Summary (Last 24 hours) at 06/01/2019 0909 Last data filed at 06/01/2019 0844 Gross per 24 hour  Intake 2283.2 ml  Output 2125 ml  Net 158.2 ml   Last 3 Weights 06/01/2019 05/31/2019 05/30/2019  Weight (lbs) 216 lb 7.9 oz 215 lb 9.8 oz 216 lb 9.6 oz  Weight (kg) 98.2 kg 97.8 kg 98.249 kg      Telemetry    Atrial fibrillation  120s  - Personally Reviewed  ECG    No new EKGs to review today - Personally Reviewed  Physical Exam   GEN: Pleasant obese middle aged WF in No acute distress.   Neck: No JVD Cardiac: irregularly irregular rhythm, tachy rate, no murmurs, rubs, or gallops.  Respiratory: Clear to auscultation bilaterally. GI: obese, soft, nontender, non-distended  MS: obese LEs, bilateral TKR scars, no pitting edema Neuro:   Nonfocal  Psych: Normal affect   Labs    High Sensitivity Troponin:   Recent Labs  Lab 05/30/19 1134 05/30/19 1449  TROPONINIHS 108* 578*      Chemistry Recent Labs  Lab 05/30/19 1134 05/30/19 1852 05/31/19 0027 05/31/19 1443 05/31/19 1446 06/01/19 0507  NA 139  --  134* 136 137 136  K 4.2  --  3.6 3.3* 3.3* 3.2*  CL 107  --  98  --   --  100  CO2 19*  --  23  --   --  24  GLUCOSE 130*  --  137*  --   --  97  BUN 12  --  13  --   --  15  CREATININE 1.04* 1.07* 1.04*  --   --  1.06*  CALCIUM 9.1  --  8.6*  --   --  8.7*  PROT 6.7  --   --   --   --   --   ALBUMIN 3.5  --   --   --   --   --   AST 20  --   --   --   --   --   ALT 21  --   --   --   --   --  ALKPHOS 102  --   --   --   --   --   BILITOT 0.3  --   --   --   --   --   GFRNONAA 56* 54* 56*  --   --  55*  GFRAA >60 >60 >60  --   --  >60  ANIONGAP 13  --  13  --   --  12     Hematology Recent Labs  Lab 05/30/19 1852 05/31/19 0027 05/31/19 1443 05/31/19 1446 06/01/19 0507  WBC 10.9* 12.0*  --   --  7.8  RBC 5.10 4.72  --   --  4.35  HGB 14.1 13.3 12.9 13.3 12.1  HCT 43.5 39.9 38.0 39.0 36.6  MCV 85.3 84.5  --   --  84.1  MCH 27.6 28.2  --   --  27.8  MCHC 32.4 33.3  --   --  33.1  RDW 14.2 14.1  --   --  14.2  PLT 298 267  --   --  233    BNP Recent Labs  Lab 05/30/19 1134  BNP 243.5*     DDimer No results for input(s): DDIMER in the last 168 hours.   Radiology    Ct Angio Chest Pe W And/or Wo Contrast  Result Date: 05/30/2019 CLINICAL DATA:  Acute onset of shortness of breath. Hypoxemia. EXAM: CT ANGIOGRAPHY CHEST WITH CONTRAST TECHNIQUE: Multidetector CT imaging of the chest was performed using the standard protocol during bolus administration of intravenous contrast. Multiplanar CT image reconstructions and MIPs were obtained to evaluate the vascular anatomy. CONTRAST:  39mL OMNIPAQUE IOHEXOL 350 MG/ML SOLN COMPARISON:  Chest x-ray dated 05/30/2019 FINDINGS: Cardiovascular: No  pulmonary emboli. Chronic cardiomegaly. No significant pericardial effusion. Mediastinum/Nodes: No enlarged mediastinal, hilar, or axillary lymph nodes. Thyroid gland, trachea, and esophagus demonstrate no significant findings. Lungs/Pleura: There are extensive bilateral pulmonary infiltrates without effusions. There is peripheral sparing of the pulmonary parenchyma bilaterally suggesting acute pulmonary edema. There is interstitial edema at the lung bases. Upper Abdomen: No acute abnormality. Musculoskeletal: No chest wall abnormality. No acute or significant osseous findings. Review of the MIP images confirms the above findings. IMPRESSION: 1. No pulmonary emboli. 2. Extensive bilateral pulmonary infiltrates without effusions, likely pulmonary edema. 3. Interstitial edema at the lung bases.  Cardiomegaly. 4. Given the acute onset of probable flash pulmonary edema, the possibility of an inferior wall myocardial infarction should be considered. Electronically Signed   By: Lorriane Shire M.D.   On: 05/30/2019 14:44   Dg Chest Port 1 View  Result Date: 05/30/2019 CLINICAL DATA:  68 year old presenting with acute onset of shortness of breath that began earlier today. Patient is tachycardic. Patient awakened with mid chest pain and tightness yesterday but that has subsequently resolved. Current history of hypertension and asthma. EXAM: PORTABLE CHEST 1 VIEW COMPARISON:  04/30/2014. FINDINGS: Cardiac silhouette upper normal in size to slightly enlarged for AP portable technique. Thoracic aorta atherosclerotic. Hilar and mediastinal contours otherwise unremarkable. Diffuse interstitial pulmonary edema with Kerley B lines present diffusely bilaterally. Asymmetric airspace opacities at the RIGHT lung base. Possible small RIGHT pleural effusion. IMPRESSION: 1. Mild CHF, with borderline to mild cardiomegaly and mild diffuse interstitial pulmonary edema. 2. Asymmetric airspace opacities at the RIGHT lung base which may  represent asymmetric airspace edema or pneumonia. Possible small RIGHT pleural effusion. Electronically Signed   By: Evangeline Dakin M.D.   On: 05/30/2019 12:37    Cardiac Studies   2D  Echo 05/31/2019  IMPRESSIONS    1. The left ventricle has moderate-severely reduced systolic function, with an ejection fraction of 30-35%. The cavity size was normal. There is moderately increased left ventricular wall thickness. Left ventricular diastolic function could not be  evaluated secondary to atrial fibrillation. Left ventricular diffuse hypokinesis.  2. Severe akinesis of the left ventricular, entire anterior wall, anteroseptal wall and apical segment.  3. The right ventricle has normal systolic function. The cavity was normal. There is no increase in right ventricular wall thickness.  4. The mitral valve is abnormal. Mild thickening of the mitral valve leaflet.  5. The aortic valve is tricuspid. Mild sclerosis of the aortic valve. No stenosis of the aortic valve.  6. The aorta is abnormal unless otherwise noted.  7. There is mild dilatation of the ascending aorta measuring 38 mm.  8. The inferior vena cava was normal in size with <50% respiratory variability.  9. The tricuspid valve is grossly normal.  [facility] 05/31/19  Conclusion    There is mild left ventricular systolic dysfunction.  LV end diastolic pressure is normal.  The left ventricular ejection fraction is 50-55% by visual estimate.   1. Normal coronary anatomy 2. Low normal to mildly reduced LV function. Assessment limited by Afib. 3. Normal right heart pressures  4. LV filling pressure are upper normal 5. Cardiac index 2.37.  Plan: medical management. Would avoid using right radial access in the future.     RHC Data Hemo Data   Most Recent Value  Fick Cardiac Output 4.63 L/min  Fick Cardiac Output Index 2.37 (L/min)/BSA  RA A Wave 10 mmHg  RA V Wave 10 mmHg  RA Mean 8 mmHg  RV Systolic Pressure 36 mmHg  RV  Diastolic Pressure 1 mmHg  RV EDP 10 mmHg  PA Systolic Pressure 38 mmHg  PA Diastolic Pressure 3 mmHg  PA Mean 22 mmHg  PW A Wave 22 mmHg  PW V Wave 21 mmHg  PW Mean 19 mmHg  AO Systolic Pressure [id number] mmHg  AO Diastolic Pressure 67 mmHg  AO Mean 85 mmHg  LV Systolic Pressure 123XX123 mmHg  LV Diastolic Pressure 0 mmHg  LV EDP 16 mmHg  AOp Systolic Pressure [id number] mmHg  AOp Diastolic Pressure 62 mmHg  AOp Mean Pressure 84 mmHg  LVp Systolic Pressure 123456 mmHg  LVp Diastolic Pressure 3 mmHg  LVp EDP Pressure 9 mmHg  QP/QS 1  TPVR Index 9.28 HRUI  TSVR Index 35.84 HRUI  PVR SVR Ratio 0.04  TPVR/TSVR Ratio 0.26     Patient Profile     Nekia Reid is a 68yo F with a hx of HTN, GERD, remote asthma and arthritis and a family h/o hemochromatosis, who presented to the ED after acute onset of SOB which occurred while shopping at [employer]>>ED w/u c/w acute CHF, elevated hs Troponin and now new onset atrial fibrillation w/ RVR.  Assessment & Plan    1. Acute Systolic CHF/ NICM: presented w/ acute dyspnea>>pulmonary edema. BNP 243 on admit. 2D echo showed moderately to severely reduced EF at 30-35%, however EF noted to be ~50-55% by visual estimate on cath yesterday.  Given discrepancy between initial 2D echo EF assessment and cardiac cath estimate, recommend repeat limited Echo, once her HR has improved, to reassess EF, as measurement will dictate further management and decision regarding guide lines therapy for systolic HF  Significant symptomatic improvement w/ IV Lasix. No resting dyspnea. No orthopnea. No edema on exam.  1.7L out in  UOP overnight  LVEDP normal on cath at 9 mmHg and RA pressure also normal at 8 mmH  Scr stable  Transition from IV to PO diuretics today  Give supplemental K for hypokalemia   F/u BMP in the am   2. Atrial Fibrillation w/ RVR: new onset. Sinus tach on admit. Went into rapid afib w/ rates in the 130s-140s on 05/31/19 around 0200. Initially given IV  metoprolol w/ little improvement. Had sustained rates in the 130s-140s >>Started on IV Cardizem drip.   Fairly asymptomatic  Hemodynamically stable w/ SBP in the 110s  Remains on IV Cardizem and PO Coreg with sustained RVR in the 110s  TFTs c/w hyperthyroidism>>TSH extremely low at <0.010. F/u Free T3 elevated at 5.5 and Free T4 elevated at 1.86. Sister has Graves disease, per pt report. Differ treatment to primary team. ? initiation of methimazole.   2D echo w/ reduce EF but normal on cath. See above. RA and LA normal size  Cath ruled out underlying coronary ischemia. No CAD.  She needs better rate control>>back away from IV diuretics to allow more BP room to uptitrate Coreg. If BP remains soft, limiting upward titration of AV nodal blocking agents, may need to add IV amiodarone for additional rate control. May need TEE guided DCCV prior to d/c if unable to achieve adequate rate control.   May also consider digoxin given reduced EF.   Recommend avoidance of long term Cardizem use given reduced LVF.   CHA2DS2 VASc score is at least 3 for CHF, Age 75-74 and female sex. This patients CHA2DS2-VASc Score and unadjusted Ischemic Stroke Rate (% per year) is equal to 3.2 % stroke rate/year from a score of 3.  All invasive procedures completed. Can change to oral anticoagulant. Will start Eliquis 5 mg BID.  Given obesity/ body habitus, consider outpatient sleep study to r/o OSA.     3. Elevated Hs Troponin: 108>>578. LHC 9/9 showed no CAD. Normal coronaries.    2/2 demand ischemia from acute CHF and rapid afib    4. Hyperthyroidism: TSH extremely low at <0.010. F/u Free T3 elevated at 5.5 and Free T4 elevated at 1.86. Sister also has Graves disease, per pt report.   Differ treatment to primary team.   ? initiation of methimazole.  Continue  blocker for HR control and management of afib.   5. Renal Insuffiency: ? Acute vs chronic, as last baseline labs were 5 years ago. SCr 1.0 on  admit (previously 0.6). GFr 55 mL/min  Stable w/ diuresis  Continue to monitor  6. Hypokalemia: K 3.2 today, in the setting of IV diuretics  Give Kdur 40 mEq now  F/u BMP in the AM   7. Family History of Hemochromatosis: brother w/ diagnosis   pt's Iron studies not c/w hemochromatosis   For questions or updates, please contact [facility] Please consult [email or web address] for contact info under        Signed, Lyda Jester, PA-C  06/01/2019, 9:09 AM    History and all data above reviewed.  Patient examined.  I agree with the findings as above.   She feels like her breathing is back to baseline.  HR is still up The patient exam reveals [id number]  ,  Lungs: Few basilar crackles  ,  Abd: Positive bowel sounds, no rebound no guarding, Ext Mild edema  .  All available labs, radiology testing, previous records reviewed. Agree with documented assessment and plan. Atrial fib:  Likely will not be able to control  rhythm until thyroid is corrected but we will work on rate control with beta blockers.  Will defer on starting meds like amio or DCCV until her TSH normalizes and we are told by endocrine that she is euvolemic.  She will have med titration as we are able with volume management.  Does not look like she has HH but ferritin is pending.  Stop Cardizem IV today and manage with PO beta blockers.  Started on DOAC.    Jeneen Rinks Ta [place]  10:50 AM  06/01/2019

## 2019-06-01 NOTE — TOC Benefit Eligibility Note (Signed)
Transition of Care [facility]) Benefit Eligibility Note    Patient Details  Name: Christina [name] MRN: [id number] Date of Birth: 04/14/1951   Medication/Dose: Arne Cleveland  2.5 MG  BID  AND ELIQUIS  5 MG BID  Covered?: Yes  Tier: 3 Drug  Prescription Coverage Preferred Pharmacy: Roseanne Kaufman with Person/Company/Phone Number:: [facility]  @ [place] [id number] # [id number]  Co-Pay: $340.00  Prior Approval: No  Deductible: Unmet       Memory Argue Phone Number: 06/01/2019, 12:27 PM

## 2019-06-01 NOTE — Progress Notes (Signed)
Per MD- at 8:30 pm- if Heart rate does not sustain over 100 we can discontinue Cardizem drip (hold on to the bag).  If heart rate goes up and sustains above 110 bpm- restart Cardizem at 24ml/hr.    Patient also refused lipitor - per patient will not take it until MD explains why she needs it.

## 2019-06-01 NOTE — Progress Notes (Signed)
Transitions of Care Pharmacist Note  Christina [name] is a 68 y.o. female that has been diagnosed with Atrial fibrillation and will be prescribed Eliquis (apixaban) at discharge.   Patient Education: I provided the following education on Eliquis to the patient: How to take the medication Described what the medication is Signs of bleeding Signs/symptoms of VTE and stroke  Answered their questions  Discharge Medications Plan: The patient wants to have their discharge medications filled by the Transitions of Care pharmacy rather than their usual pharmacy.  The primary doctor for this patient has been contacted to send all discharge medication prescriptions to the Transitions of Care pharmacy, the discharge orders pharmacy has been changed to the Transitions of Care pharmacy, the patient will receive a phone call regarding co-pay, and their medications will be delivered by the Transitions of Care pharmacy.   Insurance information: N/A: Unable to obtain since patient did not have insurance card with her. Pt's pharmacy closed. Follow up tomorrow to obtain insurance information.  Thank you,   Sherren Kerns, PharmD PGY1 Acute Care Pharmacy Resident [phone] June 01, 2019

## 2019-06-02 DIAGNOSIS — E0500 Thyrotoxicosis with diffuse goiter without thyrotoxic crisis or storm: Secondary | ICD-10-CM

## 2019-06-02 LAB — BASIC METABOLIC PANEL
Anion gap: 11 (ref 5–15)
BUN: 15 mg/dL (ref 8–23)
CO2: 24 mmol/L (ref 22–32)
Calcium: 9.3 mg/dL (ref 8.9–10.3)
Chloride: 102 mmol/L (ref 98–111)
Creatinine, Ser: 1.06 mg/dL — ABNORMAL HIGH (ref 0.44–1.00)
GFR calc Af Amer: >60 mL/min (ref >60)
GFR calc non Af Amer: 55 mL/min — ABNORMAL LOW (ref >60)
Glucose, Bld: 107 mg/dL — ABNORMAL HIGH (ref 70–99)
Potassium: 3.8 mmol/L (ref 3.5–5.1)
Sodium: 137 mmol/L (ref 135–145)

## 2019-06-02 LAB — THYROTROPIN RECEPTOR AUTOABS: Thyrotropin Receptor Ab: 3.92 IU/L — ABNORMAL HIGH (ref 0.00–1.75)

## 2019-06-02 MED ORDER — METOPROLOL TARTRATE 25 MG PO TABS
25.0000 mg | ORAL_TABLET | Freq: Four times a day (QID) | ORAL | Status: DC
  Administered 2019-06-02 – 2019-06-03 (×5): 25 mg via ORAL
  Filled 2019-06-02 (×5): qty 1

## 2019-06-02 MED ORDER — POTASSIUM CHLORIDE CRYS ER 20 MEQ PO TBCR
40.0000 meq | EXTENDED_RELEASE_TABLET | Freq: Once | ORAL | Status: AC
  Administered 2019-06-02: 40 meq via ORAL
  Filled 2019-06-02: qty 2

## 2019-06-02 MED ORDER — MAGNESIUM SULFATE 2 GM/50ML IV SOLN
2.0000 g | Freq: Once | INTRAVENOUS | Status: AC
  Administered 2019-06-02: 11:00:00 2 g via INTRAVENOUS
  Filled 2019-06-02: qty 50

## 2019-06-02 NOTE — Progress Notes (Addendum)
Progress Note  Patient Name: Christina [name] Date of Encounter: 06/02/2019  Primary Cardiologist: Minus Breeding, MD   Subjective   Doing fairly well. No current dyspnea. Asymptomatic w/ her afib. Rates in the 110s-120s. Asking if she can go home today.   Inpatient Medications    Scheduled Meds: . apixaban  5 mg Oral BID  . aspirin  81 mg Oral Daily  . atorvastatin  80 mg Oral q1800  . carvedilol  6.25 mg Oral BID WC  . furosemide  40 mg Oral BID  . methimazole  5 mg Oral BID  . pantoprazole  40 mg Oral Daily  . sodium chloride flush  3 mL Intravenous Q12H  . sodium chloride flush  3 mL Intravenous Q12H   Continuous Infusions: . sodium chloride    . diltiazem (CARDIZEM) infusion 5 mg/hr (06/01/19 2055)  . magnesium sulfate bolus IVPB     PRN Meds: sodium chloride, guaiFENesin-dextromethorphan, ondansetron **OR** ondansetron (ZOFRAN) IV, sodium chloride flush   Vital Signs    Vitals:   06/01/19 1831 06/01/19 1835 06/01/19 1930 06/02/19 0438  BP: 122/70 122/70 117/78 132/68  Pulse: (!) 103 84 78 94  Resp:   17 18  Temp:  98.5 F (36.9 C) 98.4 F (36.9 C) 98.1 F (36.7 C)  TempSrc:  Oral Oral Oral  SpO2:  95% 96% 97%  Weight:    96.9 kg  Height:        Intake/Output Summary (Last 24 hours) at 06/02/2019 0820 Last data filed at 06/02/2019 [id number] Gross per 24 hour  Intake 1356.83 ml  Output 2150 ml  Net -793.17 ml   Last 3 Weights 06/02/2019 06/01/2019 05/31/2019  Weight (lbs) 213 lb 11.2 oz 216 lb 7.9 oz 215 lb 9.8 oz  Weight (kg) 96.934 kg 98.2 kg 97.8 kg      Telemetry    Atrial fibrillation 110s-120s - Personally Reviewed  ECG    No new EKGs to review today - Personally Reviewed  Physical Exam   GEN: pleasant, obese middle aged WF in no acute distress.   Neck: No JVD Cardiac: irregularly irregular rhythm, tachy rate, no murmurs, rubs, or gallops.  Respiratory: Clear to auscultation bilaterally. GI: obese abdomen, soft, nontender, non-distended  MS:  obese LEs. Bilateral TKR scars. No pitting edema; No deformity. Neuro:  Nonfocal  Psych: Normal affect   Labs    High Sensitivity Troponin:   Recent Labs  Lab 05/30/19 1134 05/30/19 1449  TROPONINIHS 108* 578*      Chemistry Recent Labs  Lab 05/30/19 1134  05/31/19 0027  05/31/19 1446 06/01/19 0507 06/02/19 0420  NA 139  --  134*   < > 137 136 137  K 4.2  --  3.6   < > 3.3* 3.2* 3.8  CL 107  --  98  --   --  100 102  CO2 19*  --  23  --   --  24 24  GLUCOSE 130*  --  137*  --   --  97 107*  BUN 12  --  13  --   --  15 15  CREATININE 1.04*   < > 1.04*  --   --  1.06* 1.06*  CALCIUM 9.1  --  8.6*  --   --  8.7* 9.3  PROT 6.7  --   --   --   --   --   --   ALBUMIN 3.5  --   --   --   --   --   --  AST 20  --   --   --   --   --   --   ALT 21  --   --   --   --   --   --   ALKPHOS 102  --   --   --   --   --   --   BILITOT 0.3  --   --   --   --   --   --   GFRNONAA 56*   < > 56*  --   --  55* 55*  GFRAA >60   < > >60  --   --  >60 >60  ANIONGAP 13  --  13  --   --  12 11   < > = values in this interval not displayed.     Hematology Recent Labs  Lab 05/30/19 1852 05/31/19 0027 05/31/19 1443 05/31/19 1446 06/01/19 0507  WBC 10.9* 12.0*  --   --  7.8  RBC 5.10 4.72  --   --  4.35  HGB 14.1 13.3 12.9 13.3 12.1  HCT 43.5 39.9 38.0 39.0 36.6  MCV 85.3 84.5  --   --  84.1  MCH 27.6 28.2  --   --  27.8  MCHC 32.4 33.3  --   --  33.1  RDW 14.2 14.1  --   --  14.2  PLT 298 267  --   --  233    BNP Recent Labs  Lab 05/30/19 1134  BNP 243.5*     DDimer No results for input(s): DDIMER in the last 168 hours.   Radiology    No results found.  Cardiac Studies   2D Echo 05/31/2019  IMPRESSIONS   1. The left ventricle has moderate-severely reduced systolic function, with an ejection fraction of 30-35%. The cavity size was normal. There is moderately increased left ventricular wall thickness. Left ventricular diastolic function could not be  evaluated  secondary to atrial fibrillation. Left ventricular diffuse hypokinesis. 2. Severe akinesis of the left ventricular, entire anterior wall, anteroseptal wall and apical segment. 3. The right ventricle has normal systolic function. The cavity was normal. There is no increase in right ventricular wall thickness. 4. The mitral valve is abnormal. Mild thickening of the mitral valve leaflet. 5. The aortic valve is tricuspid. Mild sclerosis of the aortic valve. No stenosis of the aortic valve. 6. The aorta is abnormal unless otherwise noted. 7. There is mild dilatation of the ascending aorta measuring 38 mm. 8. The inferior vena cava was normal in size with <50% respiratory variability. 9. The tricuspid valve is grossly normal.  [facility] 05/31/19  Conclusion    There is mild left ventricular systolic dysfunction.  LV end diastolic pressure is normal.  The left ventricular ejection fraction is 50-55% by visual estimate.  1. Normal coronary anatomy 2. Low normal to mildly reduced LV function. Assessment limited by Afib. 3. Normal right heart pressures  4. LV filling pressure are upper normal 5. Cardiac index 2.37.  Plan: medical management. Would avoid using right radial access in the future.     RHC Data Hemo Data   Most Recent Value  Fick Cardiac Output 4.63 L/min  Fick Cardiac Output Index 2.37 (L/min)/BSA  RA A Wave 10 mmHg  RA V Wave 10 mmHg  RA Mean 8 mmHg  RV Systolic Pressure 36 mmHg  RV Diastolic Pressure 1 mmHg  RV EDP 10 mmHg  PA Systolic Pressure 38 mmHg  PA Diastolic Pressure 3 mmHg  PA Mean 22 mmHg  PW A Wave 22 mmHg  PW V Wave 21 mmHg  PW Mean 19 mmHg  AO Systolic Pressure [id number] mmHg  AO Diastolic Pressure 67 mmHg  AO Mean 85 mmHg  LV Systolic Pressure 123XX123 mmHg  LV Diastolic Pressure 0 mmHg  LV EDP 16 mmHg  AOp Systolic Pressure [id number] mmHg  AOp Diastolic Pressure 62 mmHg  AOp Mean Pressure 84 mmHg  LVp Systolic Pressure 123456 mmHg  LVp Diastolic  Pressure 3 mmHg  LVp EDP Pressure 9 mmHg  QP/QS 1  TPVR Index 9.28 HRUI  TSVR Index 35.84 HRUI  PVR SVR Ratio 0.04  TPVR/TSVR Ratio 0.26    Patient Profile     Christina [name] is a 68yo F with a hx of HTN, GERD, remote asthma and arthritis and a family h/o hemochromatosis, who presented to the ED after acute onset of SOB which occurred while shopping at [employer]>>ED w/u c/w acute CHF, elevated hs Troponin and now new onset atrial fibrillation w/ RVR.  Assessment & Plan    1. Acute Systolic CHF/ NICM:presented w/ acute dyspnea>>pulmonary edema. BNP 243 on admit. 2D echo showed moderately to severely reduced EF at 30-35%, however EF noted to be ~50-55% by visual estimate on cath yesterday.  Given discrepancy between initial 2D echo EF assessment and cardiac cath estimate, recommend repeat limited Echo, once her HR has improved, to reassess EF, as measurement will dictate further management and decision regarding guide lines therapy for systolic HF  If EF still reduced, consider addition of an ACE/ARB/ARNI. Continue  blocker +/- digoxin  Significant symptomatic improvement w/ IV Lasix. No resting dyspnea. No orthopnea. No edema on exam.  IV Lasix discontinued 9/10. Now getting PO 40 mg BID.   Good UOP yesterday, -2.1L out. Net I/Os -1.5L total  LVEDP normal on cath at 9 mmHg and RA pressure also normal at 8 mmH  Scr stable  Give supplemental K for hypokalemia   F/u BMP in the am  Low salt diet   2. Atrial Fibrillation w/ RVR: new onset. Sinus tach on admit. Went into rapid afib w/ rates in the 130s-140s on 05/31/19 around 0200. Initially given IV metoprolol w/ little improvement. Had sustained rates in the 130s-140s >>Started on IV Cardizem drip. On oral  blocker, Coreg Ventricular rates remain difficult to control, in the setting of active hyperthyroidism  Fairly asymptomatic  Hemodynamically stable w/ SBP in the 130s  Remains on IV Cardizem and PO Coreg  Attending MD w/  [facility] spoke w/ endocrinology. Would be ok to use amiodarone if needed, despite being hyperthyroid.  Start IV amiodarone today. Wean off of IV Cardizem.   Continue Coreg. Can further titrate.   May also consider digoxin given reduced EF  Continue methimazole to treat hyperthyroidism  2D echo w/ reduce EF but normal on cath. See above. RA and LA normal size  Cath ruled out underlying coronary ischemia. No CAD.  Recommend avoidance of long term Cardizem use given reduced LVF. Will stop IV Cardizem today  CHA2DS2 VASc scoreis at least 3 for CHF, Age 56-74 and female sex.This patients CHA2DS2-VASc Score and unadjusted Ischemic Stroke Rate (% per year) is equal to3.2 % stroke rate/year from a score of 3.  Continue Eliquis 5 mg BID.  Given obesity/ body habitus, consider outpatient sleep study to r/o OSA.     3. Elevated Hs Troponin: 108>>578.LHC 9/9 showed no CAD. Normal coronaries.    2/2 demand  ischemia from acute CHF and rapid afib    4. Hyperthyroidism: TSH extremely low at <0.010. F/u Free T3 elevated at 5.5 and Free T4 elevated at 1.86. Sister also has Graves disease, per pt report.   Methimazole 5mg  BID started 9/10  Continue ? blocker for HR control and management of afib.   F/u with endocrinology post d/c  5. Renal Insuffiency: ? Acute vs chronic, as last baseline labs were 5 years ago. SCr 1.0 on admit (previously 0.6). GFr 55 mL/min  Stable w/ diuresis  Continue to monitor  6. Hypokalemia: resolved, 3.2>>3.8 today  Given her atrial fibrillation w/ RVR, will give additional K today to keep K>4.0  Give Kdur 40 mEq x 1   F/u BMP in the AM   7. Family History of Hemochromatosis: brother w/ diagnosis   pt's Iron studies and ferritin levels not c/w hemochromatosis  For questions or updates, please contact [place] Please consult [email or web address] for contact info under        Signed, Lyda Jester, PA-C  06/02/2019, 8:20 AM    History and  all data above reviewed.  Patient examined.  She is sad.  Was somewhat SOB walking in the hall yesterday but not in acute distress.   I agree with the findings as above.  The patient exam reveals [id number]  ,  Lungs: Few basilar crackles  ,  Abd: Positive bowel sounds, no rebound no guarding, Ext No edema  .  All available labs, radiology testing, previous records reviewed. Agree with documented assessment and plan. Atrial fib:  I do not think that she needs Cardioversion as she will not maintain NSR.  I would like to try to slow this down with beta blocker today.  I will change to metoprolol tartrate 25 mg qid which can be consolidated.  Might need IV amiodarone if this does not work.    Minus Breeding  9:53 AM  06/02/2019

## 2019-06-02 NOTE — Progress Notes (Signed)
PROGRESS NOTE   Christina [name]  [id number]    DOB: 1950/12/25    DOA: 05/30/2019  PCP: Mateo Flow, MD   I have briefly reviewed patients previous medical records in [facility].  Chief Complaint  Patient presents with  . Shortness of Breath  . Cough    Brief Narrative:  68 year old female with PMH of HTN, GERD, remote asthma, arthritis, chronic DOE, longstanding intermittent palpitations without diagnosis of A. fib, family history of hemochromatosis (brother), Graves' disease (sister) presented to ED on 05/30/2019 due to acute dyspnea and chest pressure, noted to be hypoxic in ED, tachycardic up to 150s and admitted for suspected acute systolic CHF, chest pressure with elevated cardiac enzymes and concern for NSTEMI, cardiology consulted, started on IV heparin drip & s/p R/L heart cath with normal coronaries.  Hospital course complicated by A. fib with RVR, remains on IV Cardizem and IV heparin drip.  Also had an episode of fever without clear source, monitoring.  Assessment & Plan:   Active Problems:   Obese   S/P knee replacement   CHF (congestive heart failure) (HCC)   NSTEMI (non-ST elevated myocardial infarction) ([place])   AKI (acute kidney injury) ([place])   Acute pulmonary edema (HCC)   Acute respiratory failure with hypoxia (HCC)   Acute CHF (HCC)   Acute systolic CHF/NICM  Discrepancy in LVEF between TTE (30-35% with WM abnormalities) and cardiac cath (50-55%).  BMP 243 on admission.  This may not be fully accurate due to morbid obesity.  Cardiology consulted, initiated on IV furosemide 40 mg twice daily on admission.  Volume status has improved, now transitioned to PO Lasix 40 mg BID.  Intake/output and weight recording likely inaccurate.  Weight down 3 lbs.  - 1.8 mL since admission.  Carvedilol changed to Metoprolol 25 mg PO QID for difficult to control AFib RVR.  Defer decision regarding Entresto to cardiology >plan limited echo after controlled heart rate due to  discrepancy in EF as noted above.  Possible rate related CM from A. fib with RVR.  Iron: 131.  Ferritin normal. Hemochromatosis seems less likely.  Improved and stable.  A. fib with RVR  Presented initially with sinus tachycardia on admission.  Overnight of admission developed A. fib with RVR up to 130s-140s with associated hypotension and dizziness.  No significant response to IV metoprolol.  IV Cardizem initiated 9/9 however if EF low, Cardizem DC'ed.  TFTs c/w hyperthyroidism, see discussion below.  TTE and cath results as noted above.  CHA2DS2 VASc score is at least 3, IV heparin changed to Eliquis.  Reports prior history of longstanding intermittent palpitations which could be related to A. Fib.  Still has persistent RVR with ventricular rate fluctuating between 100s- 120s. My d/w Dr. Dwyane Cici, Endocrinology on 9/10 and he was OK using Amiodarone, discussed with Cardiology this morning but planning to avoid Amiodarone, will be difficult to maintain SR and goal will be to rate control. Methimazole may take a couple of weeks to kick in.   BB's switched to Metoprolol as above to better control HR. As per d/w Dr. Percival Spanish, not planning on very tight control of HR which will be difficult d/t hyperthyroidism.  Fever  Fever of 101.8 overnight 9/9.  Patient did not realize she had a fever.  No symptomatology to suggest source.  Monitor for now but if she has recurrence then consider further evaluation and management.  No recurrence  Chest discomfort and elevated troponin  HS troponin I08 >  578.  Had chest discomfort PTA.  Could be demand ischemia from new onset acute CHF, hypoxia, A. fib with RVR.  Continue aspirin, BB's, statins and IV heparin.  Status post R/L cath 9/9 with normal coronaries.  No recurrence of chest pain.  Resolved w/o recurrence  Acute respiratory failure with hypoxia  Present on admission.  Oxygen saturation 88% on room air and briefly on 15 L/min oxygen.   Secondary to decompensated CHF.  CTA chest negative for PE.  SARS coronavirus 2 testing negative.  Resolved.  Saturating in the low 90s on room air.  Essential hypertension  Controlled.  Hyperthyroidism  TSH <0.010, free T3: 5.5 & free T4: 1.86  Family history of Graves' disease and current A. fib with RVR.  Clinically without features of hyperthyroid except A. Fib. No clinical Goiter  I discussed in detail with Dr. Elayne Snare, Endocrinology on 9/10 who reviewed case with me.  He reviewed labs and CTA chest.  No goiter noted on CT chest.  As per recommendations, started methimazole 5 mg twice daily, added thyrotropin receptor antibodies to labs, unable to do I 131 scan at this time due to recent contrast and this can be considered as outpatient, he is happy to follow in the office if patient agreeable.  Thyrotropin receptor Ab positive suggesting Graves disease.  Continue Methimazole and Metoprolol.   OP f/u with Dr. Elayne Snare.  GERD without esophagitis  Continue pantoprazole  Morbid obesity/Body mass index is 40.38 kg/m.  Hypokalemia  Replaced.  Replace IV Mg and f/u in am..  Family history of hemochromatosis   DVT prophylaxis: Eliquis. Code Status: Full Family Communication: None at bedside. Patient declined my offer to speak to family/sister to update care. Disposition: Not medically ready for discharge yet.  DC home pending clinical improvement and Cardiology clearance, hopefully 9/12   Consultants:  Cardiology  Procedures:   TTE 05/31/2019:  IMPRESSIONS    1. The left ventricle has moderate-severely reduced systolic function, with an ejection fraction of 30-35%. The cavity size was normal. There is moderately increased left ventricular wall thickness. Left ventricular diastolic function could not be  evaluated secondary to atrial fibrillation. Left ventricular diffuse hypokinesis.  2. Severe akinesis of the left ventricular, entire anterior wall,  anteroseptal wall and apical segment.  3. The right ventricle has normal systolic function. The cavity was normal. There is no increase in right ventricular wall thickness.  4. The mitral valve is abnormal. Mild thickening of the mitral valve leaflet.  5. The aortic valve is tricuspid. Mild sclerosis of the aortic valve. No stenosis of the aortic valve.  6. The aorta is abnormal unless otherwise noted.  7. There is mild dilatation of the ascending aorta measuring 38 mm.  8. The inferior vena cava was normal in size with <50% respiratory variability.  9. The tricuspid valve is grossly normal.  SUMMARY   LVEF 30-35%, severe anterior, anteroseptal and apical hypokinesis to akinesis, global hypokinsis, afib with RVR noted, LV filling pressure is elevated, normal RV function, aortic valve sclerosis, mildly dilated ascending aorta to 3.8 cm, IVC suggests elevated RA pressure of 8 mmHg   R/L heart cath and coronary angiography 9/9:  Conclusion  There is mild left ventricular systolic dysfunction.  LV end diastolic pressure is normal.  The left ventricular ejection fraction is 50-55% by visual estimate.   1. Normal coronary anatomy 2. Low normal to mildly reduced LV function. Assessment limited by Afib. 3. Normal right heart pressures  4. LV  filling pressure are upper normal 5. Cardiac index 2.37.  Plan: medical management. Would avoid using right radial access in the future.  Antimicrobials:  None   Subjective: No recurrent fevers. Wants to go home bad, tearful. No CP/SOB. Continues to deny heat intolerance, weight loss, tremors etc.  Objective:  Vitals:   06/01/19 1835 06/01/19 1930 06/02/19 0438 06/02/19 0907  BP: 122/70 117/78 132/68 (!) 122/107  Pulse: 84 78 94 94  Resp:  17 18   Temp: 98.5 F (36.9 C) 98.4 F (36.9 C) 98.1 F (36.7 C)   TempSrc: Oral Oral Oral   SpO2: 95% 96% 97%   Weight:   96.9 kg   Height:        Examination:  General exam: Pleasant  middle-aged female, moderately built and morbidly obese lying comfortably propped up in bed without distress. Respiratory system: Clear to auscultation. No increased work of breathing. Cardiovascular system: Si S1 heard, Irregularly irregular and mild tachycardic.  No JVD or murmurs.  Trace b/l ankle edema.  Telemetry personally reviewed: A. fib with RVR with VR in 100-110's. Gastrointestinal system: Abdomen is nondistended, soft and nontender. No organomegaly or masses felt. Normal bowel sounds heard. Central nervous system: Alert and oriented. No focal neurological deficits. Extremities: Symmetric 5 x 5 power. Skin: No rashes, lesions or ulcers Psychiatry: Judgement and insight appear normal. Mood & affect appropriate.     Data Reviewed: I have personally reviewed following labs and imaging studies  CBC: Recent Labs  Lab 05/30/19 1134 05/30/19 1852 05/31/19 0027 05/31/19 1443 05/31/19 1446 06/01/19 0507  WBC 9.1 10.9* 12.0*  --   --  7.8  NEUTROABS 7.4  --   --   --   --   --   HGB 14.6 14.1 13.3 12.9 13.3 12.1  HCT 45.2 43.5 39.9 38.0 39.0 36.6  MCV 85.9 85.3 84.5  --   --  84.1  PLT 292 298 267  --   --  0000000   Basic Metabolic Panel: Recent Labs  Lab 05/30/19 1134 05/30/19 1852 05/31/19 0027 05/31/19 1443 05/31/19 1446 06/01/19 0507 06/01/19 1111 06/02/19 0420  NA 139  --  134* 136 137 136  --  137  K 4.2  --  3.6 3.3* 3.3* 3.2*  --  3.8  CL 107  --  98  --   --  100  --  102  CO2 19*  --  23  --   --  24  --  24  GLUCOSE 130*  --  137*  --   --  97  --  107*  BUN 12  --  13  --   --  15  --  15  CREATININE 1.04* 1.07* 1.04*  --   --  1.06*  --  1.06*  CALCIUM 9.1  --  8.6*  --   --  8.7*  --  9.3  MG  --  1.7  --   --   --   --  1.6*  --   PHOS  --  4.4  --   --   --   --   --   --    Liver Function Tests: Recent Labs  Lab 05/30/19 1134  AST 20  ALT 21  ALKPHOS 102  BILITOT 0.3  PROT 6.7  ALBUMIN 3.5    Cardiac Enzymes: No results for input(s):  CKTOTAL, CKMB, CKMBINDEX, TROPONINI in the last 168 hours.  CBG: No results for input(s): GLUCAP  in the last 168 hours.  Recent Results (from the past 240 hour(s))  SARS Coronavirus 2 [facility] order, Performed in [facility] hospital lab) Nasopharyngeal Nasopharyngeal Swab     Status: None   Collection Time: 05/30/19 11:31 AM   Specimen: Nasopharyngeal Swab  Result Value Ref Range Status   SARS Coronavirus 2 NEGATIVE NEGATIVE Final    Comment: (NOTE) If result is NEGATIVE SARS-CoV-2 target nucleic acids are NOT DETECTED. The SARS-CoV-2 RNA is generally detectable in upper and lower  respiratory specimens during the acute phase of infection. The lowest  concentration of SARS-CoV-2 viral copies this assay can detect is 250  copies / mL. A negative result does not preclude SARS-CoV-2 infection  and should not be used as the sole basis for treatment or other  patient management decisions.  A negative result may occur with  improper specimen collection / handling, submission of specimen other  than nasopharyngeal swab, presence of viral mutation(s) within the  areas targeted by this assay, and inadequate number of viral copies  (<250 copies / mL). A negative result must be combined with clinical  observations, patient history, and epidemiological information. If result is POSITIVE SARS-CoV-2 target nucleic acids are DETECTED. The SARS-CoV-2 RNA is generally detectable in upper and lower  respiratory specimens dur ing the acute phase of infection.  Positive  results are indicative of active infection with SARS-CoV-2.  Clinical  correlation with patient history and other diagnostic information is  necessary to determine patient infection status.  Positive results do  not rule out bacterial infection or co-infection with other viruses. If result is PRESUMPTIVE POSTIVE SARS-CoV-2 nucleic acids MAY BE PRESENT.   A presumptive positive result was obtained on the submitted specimen  and  confirmed on repeat testing.  While 2019 novel coronavirus  (SARS-CoV-2) nucleic acids may be present in the submitted sample  additional confirmatory testing may be necessary for epidemiological  and / or clinical management purposes  to differentiate between  SARS-CoV-2 and other Sarbecovirus currently known to infect humans.  If clinically indicated additional testing with an alternate test  methodology [phone]) is advised. The SARS-CoV-2 RNA is generally  detectable in upper and lower respiratory sp ecimens during the acute  phase of infection. The expected result is Negative. Fact Sheet for Patients:  StrictlyIdeas.no Fact Sheet for Healthcare Providers: BankingDealers.co.za This test is not yet approved or cleared by the [place] FDA and has been authorized for detection and/or diagnosis of SARS-CoV-2 by FDA under an Emergency Use Authorization (EUA).  This EUA will remain in effect (meaning this test can be used) for the duration of the COVID-19 declaration under Section 564(b)(1) of the Act, 21 U.S.C. section 360bbb-3(b)(1), unless the authorization is terminated or revoked sooner. Performed at [facility], [place] [address], [place], [place] [address]          Radiology Studies: No results found.      Scheduled Meds: . apixaban  5 mg Oral BID  . aspirin  81 mg Oral Daily  . atorvastatin  80 mg Oral q1800  . furosemide  40 mg Oral BID  . methimazole  5 mg Oral BID  . metoprolol tartrate  25 mg Oral QID  . pantoprazole  40 mg Oral Daily  . sodium chloride flush  3 mL Intravenous Q12H  . sodium chloride flush  3 mL Intravenous Q12H   Continuous Infusions: . sodium chloride       LOS: 2 days  Vernell Leep, MD, FACP, [facility]. [facility]  To contact the attending provider between 7A-7P or the covering provider during after hours 7P-7A, please log into the web site [email or web address] and access  using universal [place] password for that web site. If you do not have the password, please call the hospital operator.  06/02/2019, 11:31 AM

## 2019-06-02 NOTE — Progress Notes (Signed)
Ambulate in the hallway 22ft. HR went up to 130's, pt complained of being. light headed. Will monitor

## 2019-06-02 NOTE — Plan of Care (Signed)
  Problem: Education: Goal: Ability to demonstrate management of disease process will improve Outcome: Progressing Goal: Ability to verbalize understanding of medication therapies will improve Outcome: Progressing Goal: Individualized Educational Video(s) Outcome: Progressing   Problem: Activity: Goal: Capacity to carry out activities will improve Outcome: Progressing   Problem: Cardiac: Goal: Ability to achieve and maintain adequate cardiopulmonary perfusion will improve Outcome: Progressing   Problem: Education: Goal: Understanding of cardiac disease, CV risk reduction, and recovery process will improve Outcome: Progressing Goal: Individualized Educational Video(s) Outcome: Progressing   Problem: Activity: Goal: Ability to tolerate increased activity will improve Outcome: Progressing   Problem: Cardiac: Goal: Ability to achieve and maintain adequate cardiovascular perfusion will improve Outcome: Progressing   Problem: Health Behavior/Discharge Planning: Goal: Ability to safely manage health-related needs after discharge will improve Outcome: Progressing   Problem: Education: Goal: Knowledge of General Education information will improve Description: Including pain rating scale, medication(s)/side effects and non-pharmacologic comfort measures Outcome: Progressing   Problem: Health Behavior/Discharge Planning: Goal: Ability to manage health-related needs will improve Outcome: Progressing   Problem: Clinical Measurements: Goal: Ability to maintain clinical measurements within normal limits will improve Outcome: Progressing Goal: Will remain free from infection Outcome: Progressing Goal: Diagnostic test results will improve Outcome: Progressing Goal: Respiratory complications will improve Outcome: Progressing Goal: Cardiovascular complication will be avoided Outcome: Progressing   Problem: Activity: Goal: Risk for activity intolerance will decrease Outcome:  Progressing   Problem: Nutrition: Goal: Adequate nutrition will be maintained Outcome: Progressing   Problem: Coping: Goal: Level of anxiety will decrease Outcome: Progressing   Problem: Elimination: Goal: Will not experience complications related to bowel motility Outcome: Progressing Goal: Will not experience complications related to urinary retention Outcome: Progressing   Problem: Pain Managment: Goal: General experience of comfort will improve Outcome: Progressing   Problem: Safety: Goal: Ability to remain free from injury will improve Outcome: Progressing   Problem: Skin Integrity: Goal: Risk for impaired skin integrity will decrease Outcome: Progressing   

## 2019-06-03 ENCOUNTER — Encounter (HOSPITAL_COMMUNITY): Payer: Self-pay | Admitting: *Deleted

## 2019-06-03 ENCOUNTER — Other Ambulatory Visit: Payer: Self-pay

## 2019-06-03 DIAGNOSIS — N179 Acute kidney failure, unspecified: Secondary | ICD-10-CM

## 2019-06-03 DIAGNOSIS — R7989 Other specified abnormal findings of blood chemistry: Secondary | ICD-10-CM

## 2019-06-03 LAB — MAGNESIUM: Magnesium: 1.8 mg/dL (ref 1.7–2.4)

## 2019-06-03 LAB — BASIC METABOLIC PANEL WITH GFR
Anion gap: 14 (ref 5–15)
BUN: 15 mg/dL (ref 8–23)
CO2: 24 mmol/L (ref 22–32)
Calcium: 9.3 mg/dL (ref 8.9–10.3)
Chloride: 100 mmol/L (ref 98–111)
Creatinine, Ser: 1.19 mg/dL — ABNORMAL HIGH (ref 0.44–1.00)
GFR calc Af Amer: 55 mL/min — ABNORMAL LOW
GFR calc non Af Amer: 48 mL/min — ABNORMAL LOW
Glucose, Bld: 105 mg/dL — ABNORMAL HIGH (ref 70–99)
Potassium: 3.5 mmol/L (ref 3.5–5.1)
Sodium: 138 mmol/L (ref 135–145)

## 2019-06-03 MED ORDER — POTASSIUM CHLORIDE CRYS ER 20 MEQ PO TBCR
40.0000 meq | EXTENDED_RELEASE_TABLET | Freq: Every day | ORAL | Status: DC
  Administered 2019-06-03: 40 meq via ORAL
  Filled 2019-06-03: qty 2

## 2019-06-03 MED ORDER — METOPROLOL TARTRATE 25 MG PO TABS
25.0000 mg | ORAL_TABLET | Freq: Once | ORAL | Status: AC
  Administered 2019-06-03: 25 mg via ORAL

## 2019-06-03 MED ORDER — METOPROLOL TARTRATE 25 MG PO TABS
25.0000 mg | ORAL_TABLET | Freq: Once | ORAL | Status: AC
  Administered 2019-06-03: 25 mg via ORAL
  Filled 2019-06-03: qty 1

## 2019-06-03 MED ORDER — MAGNESIUM SULFATE 2 GM/50ML IV SOLN
2.0000 g | Freq: Once | INTRAVENOUS | Status: AC
  Administered 2019-06-03: 2 g via INTRAVENOUS
  Filled 2019-06-03 (×2): qty 50

## 2019-06-03 MED ORDER — DIGOXIN 125 MCG PO TABS
0.1250 mg | ORAL_TABLET | Freq: Every day | ORAL | Status: DC
  Administered 2019-06-03 – 2019-06-04 (×2): 0.125 mg via ORAL
  Filled 2019-06-03 (×2): qty 1

## 2019-06-03 MED ORDER — METOPROLOL TARTRATE 50 MG PO TABS
75.0000 mg | ORAL_TABLET | Freq: Two times a day (BID) | ORAL | Status: DC
  Administered 2019-06-03 – 2019-06-04 (×2): 75 mg via ORAL
  Filled 2019-06-03 (×3): qty 1

## 2019-06-03 NOTE — TOC Transition Note (Addendum)
Transition of Care [facility] - CM/SW Discharge Note   Patient Details  Name: Christina [name] MRN: [id number] Date of Birth: August 17, 1951  Transition of Care [facility]) CM/SW Contact:  Zenon Mayo, RN Phone Number: 06/03/2019, 9:31 AM   Clinical Narrative:    Patient from home alone, she is indep, will be on eliquis at dc. NCM gave her the 30 day coupon card and the copmt amt of 340.00 due to deductible not being met.  Patient states she understands because she has not used any of her deductible, she will not have a problem getting the medication, she has two months for the high copay, also she state she will look into another insurance plan in October during Enrollment period.   Final next level of care: Home/Self Care Barriers to Discharge: No Barriers Identified   Patient Goals and CMS Choice Patient states their goals for this hospitalization and ongoing recovery are:: get better   Choice offered to / list presented to : NA  Discharge Placement                       Discharge Plan and Services                DME Arranged: (NA)         HH Arranged: NA          Social Determinants of Health (SDOH) Interventions     Readmission Risk Interventions No flowsheet data found.

## 2019-06-03 NOTE — Progress Notes (Addendum)
Progress Note  Patient Name: Christina [name] Date of Encounter: 06/03/2019  Primary Cardiologist: Christina Breeding, MD   Subjective   Denies any SOB or palpitations.  HR back up in the 120's  Inpatient Medications    Scheduled Meds:  apixaban  5 mg Oral BID   aspirin  81 mg Oral Daily   atorvastatin  80 mg Oral q1800   furosemide  40 mg Oral BID   methimazole  5 mg Oral BID   metoprolol tartrate  25 mg Oral QID   pantoprazole  40 mg Oral Daily   sodium chloride flush  3 mL Intravenous Q12H   sodium chloride flush  3 mL Intravenous Q12H   Continuous Infusions:  sodium chloride     PRN Meds: sodium chloride, guaiFENesin-dextromethorphan, ondansetron **OR** ondansetron (ZOFRAN) IV, sodium chloride flush   Vital Signs    Vitals:   06/02/19 1500 06/02/19 1600 06/02/19 1946 06/03/19 0451  BP:   119/73 118/73  Pulse: (!) 107 (!) 103 70 (!) 108  Resp:   18 18  Temp:   98 F (36.7 C) 98.2 F (36.8 C)  TempSrc:   Oral Oral  SpO2:   98% 96%  Weight:    95.7 kg  Height:        Intake/Output Summary (Last 24 hours) at 06/03/2019 1001 Last data filed at 06/03/2019 0500 Gross per 24 hour  Intake 600 ml  Output 1850 ml  Net -1250 ml   Last 3 Weights 06/03/2019 06/02/2019 06/01/2019  Weight (lbs) 210 lb 14.4 oz 213 lb 11.2 oz 216 lb 7.9 oz  Weight (kg) 95.664 kg 96.934 kg 98.2 kg      Telemetry    Atrial fibrillation with HR 70-108bpm earlier this am but now in the 120's. Personally Reviewed  ECG    No new EKG to review- Personally Reviewed  Physical Exam   GEN: Well nourished, well developed in no acute distress HEENT: Normal NECK: No JVD; No carotid bruits LYMPHATICS: No lymphadenopathy CARDIAC:irregularly irregular and tachy, no murmurs, rubs, gallops RESPIRATORY:  Clear to auscultation without rales, wheezing or rhonchi  ABDOMEN: Soft, non-tender, non-distended MUSCULOSKELETAL:  No edema; No deformity  SKIN: Warm and dry NEUROLOGIC:  Alert and  oriented x 3 PSYCHIATRIC:  Normal affect    Labs    High Sensitivity Troponin:   Recent Labs  Lab 05/30/19 1134 05/30/19 1449  TROPONINIHS 108* 578*      Chemistry Recent Labs  Lab 05/30/19 1134  06/01/19 0507 06/02/19 0420 06/03/19 0643  NA 139   < > 136 137 138  K 4.2   < > 3.2* 3.8 3.5  CL 107   < > 100 102 100  CO2 19*   < > 24 24 24   GLUCOSE 130*   < > 97 107* 105*  BUN 12   < > 15 15 15   CREATININE 1.04*   < > 1.06* 1.06* 1.19*  CALCIUM 9.1   < > 8.7* 9.3 9.3  PROT 6.7  --   --   --   --   ALBUMIN 3.5  --   --   --   --   AST 20  --   --   --   --   ALT 21  --   --   --   --   ALKPHOS 102  --   --   --   --   BILITOT 0.3  --   --   --   --  GFRNONAA 56*   < > 55* 55* 48*  GFRAA >60   < > >60 >60 55*  ANIONGAP 13   < > 12 11 14    < > = values in this interval not displayed.     Hematology Recent Labs  Lab 05/30/19 1852 05/31/19 0027 05/31/19 1443 05/31/19 1446 06/01/19 0507  WBC 10.9* 12.0*  --   --  7.8  RBC 5.10 4.72  --   --  4.35  HGB 14.1 13.3 12.9 13.3 12.1  HCT 43.5 39.9 38.0 39.0 36.6  MCV 85.3 84.5  --   --  84.1  MCH 27.6 28.2  --   --  27.8  MCHC 32.4 33.3  --   --  33.1  RDW 14.2 14.1  --   --  14.2  PLT 298 267  --   --  233    BNP Recent Labs  Lab 05/30/19 1134  BNP 243.5*     DDimer No results for input(s): DDIMER in the last 168 hours.   Radiology    No results found.  Cardiac Studies   2D Echo 05/31/2019  IMPRESSIONS   1. The left ventricle has moderate-severely reduced systolic function, with an ejection fraction of 30-35%. The cavity size was normal. There is moderately increased left ventricular wall thickness. Left ventricular diastolic function could not be  evaluated secondary to atrial fibrillation. Left ventricular diffuse hypokinesis. 2. Severe akinesis of the left ventricular, entire anterior wall, anteroseptal wall and apical segment. 3. The right ventricle has normal systolic function. The cavity  was normal. There is no increase in right ventricular wall thickness. 4. The mitral valve is abnormal. Mild thickening of the mitral valve leaflet. 5. The aortic valve is tricuspid. Mild sclerosis of the aortic valve. No stenosis of the aortic valve. 6. The aorta is abnormal unless otherwise noted. 7. There is mild dilatation of the ascending aorta measuring 38 mm. 8. The inferior vena cava was normal in size with <50% respiratory variability. 9. The tricuspid valve is grossly normal.  [facility] 05/31/19  Conclusion    There is mild left ventricular systolic dysfunction.  LV end diastolic pressure is normal.  The left ventricular ejection fraction is 50-55% by visual estimate.  1. Normal coronary anatomy 2. Low normal to mildly reduced LV function. Assessment limited by Afib. 3. Normal right heart pressures  4. LV filling pressure are upper normal 5. Cardiac index 2.37.  Plan: medical management. Would avoid using right radial access in the future.     RHC Data Hemo Data   Most Recent Value  Fick Cardiac Output 4.63 L/min  Fick Cardiac Output Index 2.37 (L/min)/BSA  RA A Wave 10 mmHg  RA V Wave 10 mmHg  RA Mean 8 mmHg  RV Systolic Pressure 36 mmHg  RV Diastolic Pressure 1 mmHg  RV EDP 10 mmHg  PA Systolic Pressure 38 mmHg  PA Diastolic Pressure 3 mmHg  PA Mean 22 mmHg  PW A Wave 22 mmHg  PW V Wave 21 mmHg  PW Mean 19 mmHg  AO Systolic Pressure [id number] mmHg  AO Diastolic Pressure 67 mmHg  AO Mean 85 mmHg  LV Systolic Pressure 123XX123 mmHg  LV Diastolic Pressure 0 mmHg  LV EDP 16 mmHg  AOp Systolic Pressure [id number] mmHg  AOp Diastolic Pressure 62 mmHg  AOp Mean Pressure 84 mmHg  LVp Systolic Pressure 123456 mmHg  LVp Diastolic Pressure 3 mmHg  LVp EDP Pressure 9 mmHg  QP/QS 1  TPVR Index 9.28 HRUI  TSVR Index 35.84 HRUI  PVR SVR Ratio 0.04  TPVR/TSVR Ratio 0.26    Patient Profile     Christina [name] is a 68yo F with a hx of HTN, GERD, remote asthma and arthritis and  a family h/o hemochromatosis, who presented to the ED after acute onset of SOB which occurred while shopping at [employer]>>ED w/u c/w acute CHF, elevated hs Troponin and now new onset atrial fibrillation w/ RVR.  Assessment & Plan    1. Acute Systolic CHF/ NICM:presented w/ acute dyspnea>>pulmonary edema. BNP 243 on admit. 2D echo showed moderately to severely reduced EF at 30-35%, however EF noted to be ~50-55% by visual estimate on cath yesterday.  Given discrepancy between initial 2D echo EF assessment and cardiac cath estimate, recommend repeat limited Echo, once her HR has improved, to reassess EF, as measurement will dictate further management and decision regarding guide lines therapy for systolic HF  If EF still reduced, consider addition of an ACE/ARB/ARNI. Continue  blocker +/- digoxin  Significant symptomatic improvement w/ IV Lasix. No resting dyspnea. No orthopnea. No edema on exam.  Now on Lasix 40mg   PO BID  UOP good - she put out 1.8L yesterday and is net neg 2.8L  LVEDP normal on cath at 9 mmHg and RA pressure also normal at 8 mmH  Scr stable at 1.19  Low salt diet  2. Atrial Fibrillation w/ RVR: new onset. Sinus tach on admit. Went into rapid afib w/ rates in the 130s-140s on 05/31/19 around 0200. Initially given IV metoprolol w/ little improvement. Had sustained rates in the 130s-140s >>Started on IV Cardizem drip but subsequently stopped due to LV dysfunction. On oral  blocker, Coreg  Ventricular rates remain difficult to control, in the setting of active hyperthyroidism  Fairly asymptomatic  Hemodynamically stable w/ BP 118/28mmHg.    Attending MD w/ [facility] spoke w/ endocrinology. Would be ok to use amiodarone if needed, despite being hyperthyroid.  HR improved on Lopressor 25mg  QID - will change to 75mg  BID for better HR control and give an additional 50mg  now since she already received 25mg  PO.  could consider dig if needed given reduced LVF  Continue  methimazole to treat hyperthyroidism  2D echo w/ reduce EF but normal on cath. See above. RA and LA normal size  Cath ruled out underlying coronary ischemia. No CAD.  Recommend avoidance of long term Cardizem use given reduced LVF.   CHA2DS2 VASc scoreis at least 3 for CHF, Age 75-74 and female sex.This patients CHA2DS2-VASc Score and unadjusted Ischemic Stroke Rate (% per year) is equal to3.2 % stroke rate/year from a score of 3.  Continue Eliquis 5 mg BID.  Given obesity/ body habitus, consider outpatient sleep study to r/o OSA.    3. Elevated Hs Troponin: 108>>578.  LHC 9/9 showed no CAD. Normal coronaries. 2/2 demand ischemia from acute CHF and rapid afib   4. Hyperthyroidism: TSH extremely low at <0.010. F/u Free T3 elevated at 5.5 and Free T4 elevated at 1.86. Sister also has Graves disease, per pt report.   Methimazole 5mg  BID started 9/10  Continue ? blocker for HR control and management of afib.   F/u with endocrinology post d/c  5. Renal Insuffiency: ? Acute vs chronic, as last baseline labs were 5 years ago. SCr 1.0 on admit (previously 0.6). GFr 55 mL/min  Stable w/ diuresis - creatinine 1.19 today  6. Hypokalemia: resolved, 3.2>>3.5 today  Given her atrial fibrillation w/ RVR,  will give additional K today to keep K>4.0  Repeat BMET in am  I have spent a total of 35 minutes with patient reviewing 2D echo , telemetry, EKGs, labs and examining patient as well as establishing an assessment and plan that was discussed with the patient.  > 50% of time was spent in direct patient care.    For questions or updates, please contact [place] Please consult [email or web address] for contact info under       Signed, Fransico Him, MD  06/03/2019, 10:01 AM

## 2019-06-03 NOTE — Progress Notes (Addendum)
PROGRESS NOTE   Christina [name]  [id number]    DOB: 01/29/1951    DOA: 05/30/2019  PCP: Mateo Flow, MD   I have briefly reviewed patients previous medical records in [facility].  Chief Complaint  Patient presents with  . Shortness of Breath  . Cough    Brief Narrative:  68 year old female with PMH of HTN, GERD, remote asthma, arthritis, chronic DOE, longstanding intermittent palpitations without diagnosis of A. fib, family history of hemochromatosis (brother), Graves' disease (sister) presented to ED on 05/30/2019 due to acute dyspnea and chest pressure, noted to be hypoxic in ED, tachycardic up to 150s and admitted for suspected acute systolic CHF, chest pressure with elevated cardiac enzymes and concern for NSTEMI, cardiology consulted, started on IV heparin drip & s/p R/L heart cath with normal coronaries.  Hospital course complicated by A. fib with RVR, remains on IV Cardizem and IV heparin drip.  Also had an episode of fever without clear source, resolved without recurrence.  Difficult to control ventricular rate.  Assessment & Plan:   Active Problems:   Obese   S/P knee replacement   CHF (congestive heart failure) (HCC)   NSTEMI (non-ST elevated myocardial infarction) ([place])   AKI (acute kidney injury) ([place])   Acute pulmonary edema (HCC)   Acute respiratory failure with hypoxia (HCC)   Acute CHF (HCC)   Acute systolic CHF/NICM  Discrepancy in LVEF between TTE (30-35% with WM abnormalities) and cardiac cath (50-55%).  BMP 243 on admission.  This may not be fully accurate due to morbid obesity.  Cardiology consulted, initiated on IV furosemide 40 mg twice daily on admission.  Volume status has improved, now transitioned to PO Lasix 40 mg BID.  Weight down 6 lbs. -2.6 L since admission.  Defer decision regarding Entresto to cardiology >plan limited echo after controlled heart rate due to discrepancy in EF as noted above.  Possible rate related CM from A. fib with  RVR.  Iron: 131.  Ferritin normal. Hemochromatosis seems less likely.  Clinically euvolemic.  Cardiology follow-up appreciated and have changed metoprolol from 25 mg 4 times daily to 75 mg twice daily.  A. fib with RVR  Presented initially with sinus tachycardia on admission.  Overnight of admission developed A. fib with RVR up to 130s-140s with associated hypotension and dizziness.  No significant response to IV metoprolol.  IV Cardizem initiated 9/9 however EF low, Cardizem DC'ed.  TFTs c/w hyperthyroidism, see discussion below.  TTE and cath results as noted above.  CHA2DS2 VASc score is at least 3, IV heparin changed to Eliquis.  Reports prior history of longstanding intermittent palpitations which could be related to A. Fib.  Still has persistent A. fib with RVR ranging between 100s-120s at rest and with minimal activity yesterday increased to 150 and patient symptomatic of dizziness.  Cardiology avoiding amiodarone.  Carvedilol was switched to metoprolol 25 mg 4 times daily which was again changed today to 75 mg twice daily.  Discussed with Dr. Radford Pax and in addition to Metoprolol, has added Digoxin 0.125 mg daily for rate control.  Fever  Fever of 101.8 overnight 9/9.  Patient did not realize she had a fever.  No symptomatology to suggest source.  Monitor for now but if she has recurrence then consider further evaluation and management.  Single episode without recurrence.  Chest discomfort and elevated troponin  HS troponin I08 > 578.  Had chest discomfort PTA.  Could be demand ischemia from new onset acute CHF,  hypoxia, A. fib with RVR.  Continue aspirin, BB's, statins and IV heparin.  Status post R/L cath 9/9 with normal coronaries.  No recurrence of chest pain.  Resolved w/o recurrence  Acute respiratory failure with hypoxia  Present on admission.  Oxygen saturation 88% on room air and briefly on 15 L/min oxygen.  Secondary to decompensated CHF.  CTA  chest negative for PE.  SARS coronavirus 2 testing negative.  Resolved and saturating normally on room air.  Essential hypertension  Controlled.  Hyperthyroidism  TSH <0.010, free T3: 5.5 & free T4: 1.86  Family history of Graves' disease and current A. fib with RVR.  Clinically without features of hyperthyroid except A. Fib. No clinical Goiter  I discussed in detail with Dr. Elayne Snare, Endocrinology on 9/10 who reviewed case with me.  He reviewed labs and CTA chest.  No goiter noted on CT chest.  As per recommendations, started methimazole 5 mg twice daily which may take a couple of weeks to kick in, thyrotropin receptor antibodies checked and positive suggestive of Graves' disease, unable to do I 131 scan at this time due to recent contrast and this can be considered as outpatient, he is happy to follow in the office if patient agreeable.?  Consideration for RAI as outpatient.  Continue Methimazole and Metoprolol.   OP f/u with Dr. Elayne Snare.  GERD without esophagitis  Continue pantoprazole  Morbid obesity/Body mass index is 39.85 kg/m.  Hypokalemia/hypomagnesemia  Replaced.  Family history of hemochromatosis   DVT prophylaxis: Eliquis. Code Status: Full Family Communication: None at bedside. Patient declined my offer to speak to family/sister to update care. Disposition: Not medically ready for discharge pending adequate control of RVR.   Consultants:  Cardiology  Procedures:   TTE 05/31/2019:  IMPRESSIONS    1. The left ventricle has moderate-severely reduced systolic function, with an ejection fraction of 30-35%. The cavity size was normal. There is moderately increased left ventricular wall thickness. Left ventricular diastolic function could not be  evaluated secondary to atrial fibrillation. Left ventricular diffuse hypokinesis.  2. Severe akinesis of the left ventricular, entire anterior wall, anteroseptal wall and apical segment.  3. The right  ventricle has normal systolic function. The cavity was normal. There is no increase in right ventricular wall thickness.  4. The mitral valve is abnormal. Mild thickening of the mitral valve leaflet.  5. The aortic valve is tricuspid. Mild sclerosis of the aortic valve. No stenosis of the aortic valve.  6. The aorta is abnormal unless otherwise noted.  7. There is mild dilatation of the ascending aorta measuring 38 mm.  8. The inferior vena cava was normal in size with <50% respiratory variability.  9. The tricuspid valve is grossly normal.  SUMMARY   LVEF 30-35%, severe anterior, anteroseptal and apical hypokinesis to akinesis, global hypokinsis, afib with RVR noted, LV filling pressure is elevated, normal RV function, aortic valve sclerosis, mildly dilated ascending aorta to 3.8 cm, IVC suggests elevated RA pressure of 8 mmHg   R/L heart cath and coronary angiography 9/9:  Conclusion  There is mild left ventricular systolic dysfunction.  LV end diastolic pressure is normal.  The left ventricular ejection fraction is 50-55% by visual estimate.   1. Normal coronary anatomy 2. Low normal to mildly reduced LV function. Assessment limited by Afib. 3. Normal right heart pressures  4. LV filling pressure are upper normal 5. Cardiac index 2.37.  Plan: medical management. Would avoid using right radial access in the future.  Antimicrobials:  None   Subjective: No chest pain, dyspnea, dizziness, lightheadedness or palpitations at rest.  However when she ambulated 50 feet yesterday, reported dizziness while telemetry reported ventricular rate in the 150s.  Understands that she needs to stay in the hospital until medically optimized for safe discharge home.  Objective:  Vitals:   06/02/19 1500 06/02/19 1600 06/02/19 1946 06/03/19 0451  BP:   119/73 118/73  Pulse: (!) 107 (!) 103 70 (!) 108  Resp:   18 18  Temp:   98 F (36.7 C) 98.2 F (36.8 C)  TempSrc:   Oral Oral   SpO2:   98% 96%  Weight:    95.7 kg  Height:        Examination:  General exam: Pleasant middle-aged female, moderately built and morbidly obese lying comfortably propped up in bed without distress.  Appears to be in good spirits today. Respiratory system: Clear to auscultation.  No increased work of breathing. Cardiovascular system: Si S1 heard, Irregularly irregular and mild tachycardic.  No JVD or murmurs.  Trace b/l ankle edema.  Telemetry personally reviewed: A. fib with RVR ranging in the 100s-120s. Gastrointestinal system: Abdomen is nondistended, soft and nontender. No organomegaly or masses felt. Normal bowel sounds heard. Central nervous system: Alert and oriented. No focal neurological deficits. Extremities: Symmetric 5 x 5 power. Skin: No rashes, lesions or ulcers Psychiatry: Judgement and insight appear normal. Mood & affect appropriate.     Data Reviewed: I have personally reviewed following labs and imaging studies  CBC: Recent Labs  Lab 05/30/19 1134 05/30/19 1852 05/31/19 0027 05/31/19 1443 05/31/19 1446 06/01/19 0507  WBC 9.1 10.9* 12.0*  --   --  7.8  NEUTROABS 7.4  --   --   --   --   --   HGB 14.6 14.1 13.3 12.9 13.3 12.1  HCT 45.2 43.5 39.9 38.0 39.0 36.6  MCV 85.9 85.3 84.5  --   --  84.1  PLT 292 298 267  --   --  0000000   Basic Metabolic Panel: Recent Labs  Lab 05/30/19 1134 05/30/19 1852 05/31/19 0027 05/31/19 1443 05/31/19 1446 06/01/19 0507 06/01/19 1111 06/02/19 0420 06/03/19 0643  NA 139  --  134* 136 137 136  --  137 138  K 4.2  --  3.6 3.3* 3.3* 3.2*  --  3.8 3.5  CL 107  --  98  --   --  100  --  102 100  CO2 19*  --  23  --   --  24  --  24 24  GLUCOSE 130*  --  137*  --   --  97  --  107* 105*  BUN 12  --  13  --   --  15  --  15 15  CREATININE 1.04* 1.07* 1.04*  --   --  1.06*  --  1.06* 1.19*  CALCIUM 9.1  --  8.6*  --   --  8.7*  --  9.3 9.3  MG  --  1.7  --   --   --   --  1.6*  --  1.8  PHOS  --  4.4  --   --   --   --    --   --   --    Liver Function Tests: Recent Labs  Lab 05/30/19 1134  AST 20  ALT 21  ALKPHOS 102  BILITOT 0.3  PROT 6.7  ALBUMIN 3.5  Cardiac Enzymes: No results for input(s): CKTOTAL, CKMB, CKMBINDEX, TROPONINI in the last 168 hours.  CBG: No results for input(s): GLUCAP in the last 168 hours.  Recent Results (from the past 240 hour(s))  SARS Coronavirus 2 [facility] order, Performed in [facility] hospital lab) Nasopharyngeal Nasopharyngeal Swab     Status: None   Collection Time: 05/30/19 11:31 AM   Specimen: Nasopharyngeal Swab  Result Value Ref Range Status   SARS Coronavirus 2 NEGATIVE NEGATIVE Final    Comment: (NOTE) If result is NEGATIVE SARS-CoV-2 target nucleic acids are NOT DETECTED. The SARS-CoV-2 RNA is generally detectable in upper and lower  respiratory specimens during the acute phase of infection. The lowest  concentration of SARS-CoV-2 viral copies this assay can detect is 250  copies / mL. A negative result does not preclude SARS-CoV-2 infection  and should not be used as the sole basis for treatment or other  patient management decisions.  A negative result may occur with  improper specimen collection / handling, submission of specimen other  than nasopharyngeal swab, presence of viral mutation(s) within the  areas targeted by this assay, and inadequate number of viral copies  (<250 copies / mL). A negative result must be combined with clinical  observations, patient history, and epidemiological information. If result is POSITIVE SARS-CoV-2 target nucleic acids are DETECTED. The SARS-CoV-2 RNA is generally detectable in upper and lower  respiratory specimens dur ing the acute phase of infection.  Positive  results are indicative of active infection with SARS-CoV-2.  Clinical  correlation with patient history and other diagnostic information is  necessary to determine patient infection status.  Positive results do  not rule out bacterial infection  or co-infection with other viruses. If result is PRESUMPTIVE POSTIVE SARS-CoV-2 nucleic acids MAY BE PRESENT.   A presumptive positive result was obtained on the submitted specimen  and confirmed on repeat testing.  While 2019 novel coronavirus  (SARS-CoV-2) nucleic acids may be present in the submitted sample  additional confirmatory testing may be necessary for epidemiological  and / or clinical management purposes  to differentiate between  SARS-CoV-2 and other Sarbecovirus currently known to infect humans.  If clinically indicated additional testing with an alternate test  methodology [phone]) is advised. The SARS-CoV-2 RNA is generally  detectable in upper and lower respiratory sp ecimens during the acute  phase of infection. The expected result is Negative. Fact Sheet for Patients:  StrictlyIdeas.no Fact Sheet for Healthcare Providers: BankingDealers.co.za This test is not yet approved or cleared by the [place] FDA and has been authorized for detection and/or diagnosis of SARS-CoV-2 by FDA under an Emergency Use Authorization (EUA).  This EUA will remain in effect (meaning this test can be used) for the duration of the COVID-19 declaration under Section 564(b)(1) of the Act, 21 U.S.C. section 360bbb-3(b)(1), unless the authorization is terminated or revoked sooner. Performed at [facility], [place] [address], [place], [place] [address]          Radiology Studies: No results found.      Scheduled Meds: . apixaban  5 mg Oral BID  . atorvastatin  80 mg Oral q1800  . furosemide  40 mg Oral BID  . methimazole  5 mg Oral BID  . metoprolol tartrate  75 mg Oral BID  . pantoprazole  40 mg Oral Daily  . potassium chloride  40 mEq Oral Daily  . sodium chloride flush  3 mL Intravenous Q12H  . sodium chloride flush  3 mL Intravenous Q12H   Continuous Infusions: . sodium chloride    . magnesium sulfate bolus IVPB        LOS: 3 days     Vernell Leep, MD, FACP, [facility]. [facility]  To contact the attending provider between 7A-7P or the covering provider during after hours 7P-7A, please log into the web site [email or web address] and access using universal [place] password for that web site. If you do not have the password, please call the hospital operator.  06/03/2019, 11:16 AM

## 2019-06-04 ENCOUNTER — Encounter (HOSPITAL_COMMUNITY): Payer: Self-pay

## 2019-06-04 ENCOUNTER — Inpatient Hospital Stay (HOSPITAL_COMMUNITY): Payer: Medicare Other

## 2019-06-04 DIAGNOSIS — I361 Nonrheumatic tricuspid (valve) insufficiency: Secondary | ICD-10-CM

## 2019-06-04 DIAGNOSIS — I340 Nonrheumatic mitral (valve) insufficiency: Secondary | ICD-10-CM

## 2019-06-04 LAB — BASIC METABOLIC PANEL
Anion gap: 12 (ref 5–15)
BUN: 17 mg/dL (ref 8–23)
CO2: 27 mmol/L (ref 22–32)
Calcium: 9.3 mg/dL (ref 8.9–10.3)
Chloride: 98 mmol/L (ref 98–111)
Creatinine, Ser: 1.14 mg/dL — ABNORMAL HIGH (ref 0.44–1.00)
GFR calc Af Amer: 58 mL/min — ABNORMAL LOW (ref >60)
GFR calc non Af Amer: 50 mL/min — ABNORMAL LOW (ref >60)
Glucose, Bld: 102 mg/dL — ABNORMAL HIGH (ref 70–99)
Potassium: 3.5 mmol/L (ref 3.5–5.1)
Sodium: 137 mmol/L (ref 135–145)

## 2019-06-04 LAB — ECHOCARDIOGRAM LIMITED
Height: 61 in
Weight: 3358.4 oz

## 2019-06-04 MED ORDER — DIGOXIN 125 MCG PO TABS: 0.1250 mg | ORAL_TABLET | Freq: Every day | ORAL | 0 refills | Status: DC

## 2019-06-04 MED ORDER — POTASSIUM CHLORIDE CRYS ER 20 MEQ PO TBCR
40.0000 meq | EXTENDED_RELEASE_TABLET | Freq: Two times a day (BID) | ORAL | Status: DC
  Administered 2019-06-04: 40 meq via ORAL
  Filled 2019-06-04: qty 2

## 2019-06-04 MED ORDER — FUROSEMIDE 40 MG PO TABS: 40.0000 mg | ORAL_TABLET | Freq: Two times a day (BID) | ORAL | 0 refills | Status: DC

## 2019-06-04 MED ORDER — METOPROLOL TARTRATE 75 MG PO TABS: 75.0000 mg | ORAL_TABLET | Freq: Two times a day (BID) | ORAL | 0 refills | Status: DC

## 2019-06-04 MED ORDER — LOSARTAN POTASSIUM 25 MG PO TABS: 25.0000 mg | ORAL_TABLET | Freq: Every day | ORAL | 0 refills | Status: DC

## 2019-06-04 MED ORDER — LOSARTAN POTASSIUM 25 MG PO TABS
25.0000 mg | ORAL_TABLET | Freq: Every day | ORAL | Status: DC
  Administered 2019-06-04: 25 mg via ORAL
  Filled 2019-06-04: qty 1

## 2019-06-04 MED ORDER — APIXABAN 5 MG PO TABS: 5.0000 mg | ORAL_TABLET | Freq: Two times a day (BID) | ORAL | 0 refills | Status: DC

## 2019-06-04 MED ORDER — POTASSIUM CHLORIDE CRYS ER 20 MEQ PO TBCR: 20.0000 meq | EXTENDED_RELEASE_TABLET | Freq: Two times a day (BID) | ORAL | 0 refills | Status: DC

## 2019-06-04 MED ORDER — PERFLUTREN LIPID MICROSPHERE
1.0000 mL | INTRAVENOUS | Status: AC | PRN
  Administered 2019-06-04: 2 mL via INTRAVENOUS
  Filled 2019-06-04: qty 10

## 2019-06-04 MED ORDER — METHIMAZOLE 5 MG PO TABS: 5.0000 mg | ORAL_TABLET | Freq: Two times a day (BID) | ORAL | 0 refills | Status: DC

## 2019-06-04 NOTE — Progress Notes (Signed)
Page to MD 3e22 pt concerned about DC time re pharmacy hours.  MD states he just spoke with patient after this RN left the room, discussed prescriptions/timing/discharge with her directly.

## 2019-06-04 NOTE — Progress Notes (Signed)
Patient awake during report, pt denies complaints. Pt MD on floor shortly following report, observes new conversion to SR, suspects DC when cards clears patient.

## 2019-06-04 NOTE — Progress Notes (Signed)
  Echocardiogram 2D Echocardiogram has been performed.  Christina [name] L Androw 06/04/2019, 11:13 AM

## 2019-06-04 NOTE — Discharge Instructions (Signed)
Information on my medicine - ELIQUIS (apixaban)   Why was Eliquis prescribed for you? Eliquis was prescribed for you to reduce the risk of forming blood clots that can cause a stroke if you have a medical condition called atrial fibrillation (a type of irregular heartbeat) OR to reduce the risk of a blood clots forming after orthopedic surgery.  What do You need to know about Eliquis ? Take your Eliquis TWICE DAILY - one tablet in the morning and one tablet in the evening with or without food.  It would be best to take the doses about the same time each day.  If you have difficulty swallowing the tablet whole please discuss with your pharmacist how to take the medication safely.  Take Eliquis exactly as prescribed by your doctor and DO NOT stop taking Eliquis without talking to the doctor who prescribed the medication.  Stopping may increase your risk of developing a new clot or stroke.  Refill your prescription before you run out.  After discharge, you should have regular check-up appointments with your healthcare provider that is prescribing your Eliquis.  In the future your dose may need to be changed if your kidney function or weight changes by a significant amount or as you get older.  What do you do if you miss a dose? If you miss a dose, take it as soon as you remember on the same day and resume taking twice daily.  Do not take more than one dose of ELIQUIS at the same time.  Important Safety Information A possible side effect of Eliquis is bleeding. You should call your healthcare provider right away if you experience any of the following: Bleeding from an injury or your nose that does not stop. Unusual colored urine (red or dark brown) or unusual colored stools (red or black). Unusual bruising for unknown reasons. A serious fall or if you hit your head (even if there is no bleeding).  Some medicines may interact with Eliquis and might increase your risk of bleeding or  clotting while on Eliquis. To help avoid this, consult your healthcare provider or pharmacist prior to using any new prescription or non-prescription medications, including herbals, vitamins, non-steroidal anti-inflammatory drugs (NSAIDs) and supplements.  This website has more information on Eliquis (apixaban): www.Eliquis.com.   Additional discharge instructions  Please get your medications reviewed and adjusted by your Primary MD.  Please request your Primary MD to go over all Hospital Tests and Procedure/Radiological results at the follow up, please get all Hospital records sent to your Prim MD by signing hospital release before you go home.  If you had Pneumonia of Lung problems at the Hospital: Please get a 2 view Chest X ray done in 6-8 weeks after hospital discharge or sooner if instructed by your Primary MD.  If you have Congestive Heart Failure: Please call your Cardiologist or Primary MD anytime you have any of the following symptoms:  1) 3 pound weight gain in 24 hours or 5 pounds in 1 week  2) shortness of breath, with or without a dry hacking cough  3) swelling in the hands, feet or stomach  4) if you have to sleep on extra pillows at night in order to breathe  Follow cardiac low salt diet and 1.5 lit/day fluid restriction.  If you have diabetes Accuchecks 4 times/day, Once in AM empty stomach and then before each meal. Log in all results and show them to your primary doctor at your next visit. If any glucose   reading is under 80 or above 300 call your primary MD immediately.  If you have Seizure/Convulsions/Epilepsy: Please do not drive, operate heavy machinery, participate in activities at heights or participate in high speed sports until you have seen by Primary MD or a Neurologist and advised to do so again.  If you had Gastrointestinal Bleeding: Please ask your Primary MD to check a complete blood count within one week of discharge or at your next visit. Your  endoscopic/colonoscopic biopsies that are pending at the time of discharge, will also need to followed by your Primary MD.  Get Medicines reviewed and adjusted. Please take all your medications with you for your next visit with your Primary MD  Please request your Primary MD to go over all hospital tests and procedure/radiological results at the follow up, please ask your Primary MD to get all Hospital records sent to his/her office.  If you experience worsening of your admission symptoms, develop shortness of breath, life threatening emergency, suicidal or homicidal thoughts you must seek medical attention immediately by calling 911 or calling your MD immediately  if symptoms less severe.  You must read complete instructions/literature along with all the possible adverse reactions/side effects for all the Medicines you take and that have been prescribed to you. Take any new Medicines after you have completely understood and accpet all the possible adverse reactions/side effects.   Do not drive or operate heavy machinery when taking Pain medications.   Do not take more than prescribed Pain, Sleep and Anxiety Medications  Special Instructions: If you have smoked or chewed Tobacco  in the last 2 yrs please stop smoking, stop any regular Alcohol  and or any Recreational drug use.  Wear Seat belts while driving.  Please note You were cared for by a hospitalist during your hospital stay. If you have any questions about your discharge medications or the care you received while you were in the hospital after you are discharged, you can call the unit and asked to speak with the hospitalist on call if the hospitalist that took care of you is not available. Once you are discharged, your primary care physician will handle any further medical issues. Please note that NO REFILLS for any discharge medications will be authorized once you are discharged, as it is imperative that you return to your primary care  physician (or establish a relationship with a primary care physician if you do not have one) for your aftercare needs so that they can reassess your need for medications and monitor your lab values.  You can reach the hospitalist office at phone [phone] or fax [phone]   If you do not have a primary care physician, you can call [phone] for a physician referral.   

## 2019-06-04 NOTE — Progress Notes (Signed)
Call placed to [facility] to notify of telemetry monitoring d/c.   

## 2019-06-04 NOTE — Discharge Summary (Signed)
Physician Discharge Summary  Christina [name] [id number] DOB: 1950/09/28  PCP: Mateo Flow, MD  Admitted from: Home Discharged to: Home  Admit date: 05/30/2019 Discharge date: 06/04/2019  Recommendations for Outpatient Follow-up:   Follow-up Information    Mateo Flow, MD. Schedule an appointment as soon as possible for a visit.   Specialty: Family Medicine Why: To be seen in 1 week with repeat labs (CBC & BMP). Contact information: [place] [address] [id number]        Elayne Snare, MD. Schedule an appointment as soon as possible for a visit in 1 week(s).   Specialty: Endocrinology Contact information: [place] [address] [phone]        Sueanne Margarita, MD. Schedule an appointment as soon as possible for a visit in 1 week(s).   Specialty: Cardiology Why: MDs office will call you with appointment.  Please call them back if you do not hear from them in 2-3 business days. Contact information: [id number] [place] [address] [phone]            Home Health: None Equipment/Devices: None  Discharge Condition: Improved and stable CODE STATUS: Full Diet recommendation: Heart healthy diet.  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 fib, family history of hemochromatosis (brother), Graves' disease (sister) presented to ED on 05/30/2019 due to acute dyspnea and chest pressure, noted to  be hypoxic in ED, tachycardic up to 150s and admitted for suspected acute systolic CHF, chest pressure with elevated cardiac enzymes and concern for NSTEMI, cardiology consulted, started on IV heparin drip & s/p R/L heart cath with normal coronaries.  Hospital course complicated by A. fib with RVR, briefly on IV Cardizem and heparin drips, subsequently switched to oral metoprolol and digoxin added.  Reverted to sinus rhythm on morning of discharge.  Assessment & Plan:   Acute systolic CHF/NICM  Discrepancy in LVEF between TTE (30-35% with WM abnormalities) and cardiac cath (50-55%).  BMP 243 on admission.  This may not be fully accurate due to morbid obesity.  Cardiology consulted, initiated on IV furosemide 40 mg twice daily on admission.  Volume status has improved, now transitioned to PO Lasix 40 mg BID.  Weight down 6 lbs.   -4 L since admission  Possible rate related CM from A. fib with RVR.  Iron: 131.  Ferritin normal. Hemochromatosis seems less likely.  Clinically euvolemic.  Continue metoprolol 75 mg twice daily.  Cardiology saw her again today and since she was in sinus rhythm requested limited echo which showed LVEF of 45-50% with LV hypokinesis.  I discussed with Dr. Radford Pax who recommended initiating losartan 25 mg daily, cleared for discharge home and her office will arrange outpatient follow-up in a week or so.  Since patient has been started on ARB, reduced Kcl dose to 20 meq bid.  Follow BMP closely as outpatient.  Patient extensively counseled regarding sodium and fluid restriction.  A. fib  with RVR  Presented initially with sinus tachycardia on admission.  Overnight of admission developed A. fib with RVR up to 130s-140s with associated hypotension and dizziness.  No significant response to IV metoprolol.  IV Cardizem initiated 9/9 however EF low, Cardizem DC'ed.  TFTs c/w hyperthyroidism, see discussion below.  TTE and cath results as noted above.  CHA2DS2 VASc  scoreis at least 3, IV heparin changed to Eliquis.  Reports prior history of longstanding intermittent palpitations which could be related to A. Fib.  Since Eliquis initiated, normal coronaries, discontinued aspirin and also NSAIDs to reduce bleeding risks.  She had difficult to control RVR, most likely due to her hyperthyroidism.  Cardiology avoiding amiodarone.  Carvedilol was switched to metoprolol and dose was adjusted to 75 mg twice daily.  Digoxin 0.125 mg daily started 9/12.  Patient reverted to sinus rhythm on 9/13 at 6:35 AM.  At discharge continue metoprolol 75 mg twice daily, digoxin 0.125 mg daily and Eliquis 5 mg twice daily.  Patient was assisted by case management with Eliquis discount card.  Fever  Fever of 101.8 overnight 9/9.  Patient did not realize she had a fever.  No symptomatology to suggest source.  Monitor for now but if she has recurrence then consider further evaluation and management.  Single episode without recurrence.  Chest discomfort and elevated troponin  HS troponin I08 > 578.  Had chest discomfort PTA.  Elevated troponin could be demand ischemia from new onset acute CHF, hypoxia, A. fib with RVR.  Status post R/L cath 9/9 with normal coronaries.  No recurrence of chest pain.  Patient had been started on Atorvastatin 80 mg daily on admission.  However since normal coronaries, LDL 79, discussed with Dr. Radford Pax today who recommended discontinuing Atorvastatin.  Resolved w/o recurrence  Acute respiratory failure with hypoxia  Present on admission.  Oxygen saturation 88% on room air and briefly on 15 L/min oxygen.  Secondary to decompensated CHF.  CTA chest negative for PE.  SARS coronavirus 2 testing negative.  Resolved and saturating normally on room air.  Essential hypertension  Controlled.  Hyperthyroidism  TSH <0.010, free T3: 5.5 & free T4: 1.86  Family history of Graves' disease and current admission with A. fib with  RVR.  Clinically without features of hyperthyroid except A. Fib. No clinical Goiter  I discussed in detail with Dr. Elayne Snare, Endocrinology on 9/10 who reviewed case with me.  He reviewed labs and CTA chest.  No goiter noted on CT chest.  As per recommendations, started methimazole 5 mg twice daily which may take a couple of weeks to kick in, thyrotropin receptor antibodies checked and positive suggestive of Graves' disease, unable to do I 131 scan at this time due to recent contrast and this can be considered as outpatient, he is happy to follow in the office and patient is agreeable.?  Consideration for RAI as outpatient.  Continue Methimazole and Metoprolol.   OP f/u with Dr. Elayne Snare.  GERD without esophagitis  Continue pantoprazole  Morbid obesity/Body mass index is 39.85 kg/m.  Hypokalemia/hypomagnesemia  Replaced.  Family history of hemochromatosis  Acne  Patient uses chronic Bactrim.   Consultants:  Cardiology  Procedures:    TTE 05/31/2019:  IMPRESSIONS   1. The left ventricle has moderate-severely reduced systolic function, with an ejection fraction of 30-35%. The cavity size was normal. There is moderately increased left ventricular wall thickness. Left ventricular diastolic function could not be  evaluated secondary to atrial fibrillation. Left ventricular  diffuse hypokinesis. 2. Severe akinesis of the left ventricular, entire anterior wall, anteroseptal wall and apical segment. 3. The right ventricle has normal systolic function. The cavity was normal. There is no increase in right ventricular wall thickness. 4. The mitral valve is abnormal. Mild thickening of the mitral valve leaflet. 5. The aortic valve is tricuspid. Mild sclerosis of the aortic valve. No stenosis of the aortic valve. 6. The aorta is abnormal unless otherwise noted. 7. There is mild dilatation of the ascending aorta measuring 38 mm. 8. The inferior vena cava was normal  in size with <50% respiratory variability. 9. The tricuspid valve is grossly normal.  SUMMARY  LVEF 30-35%, severe anterior, anteroseptal and apical hypokinesis to akinesis, global hypokinsis, afib with RVR noted, LV filling pressure is elevated, normal RV function, aortic valve sclerosis, mildly dilated ascending aorta to 3.8 cm, IVC suggests elevated RA pressure of 8 mmHg    R/L heart cath and coronary angiography 05/31/2019:  Conclusion  There is mild left ventricular systolic dysfunction.  LV end diastolic pressure is normal.  The left ventricular ejection fraction is 50-55% by visual estimate.  1. Normal coronary anatomy 2. Low normal to mildly reduced LV function. Assessment limited by Afib. 3. Normal right heart pressures  4. LV filling pressure are upper normal 5. Cardiac index 2.37.  Plan: medical management. Would avoid using right radial access in the future.   Limited echocardiogram 06/03/2018:  IMPRESSIONS    1. The left ventricle has mildly reduced systolic function, with an ejection fraction of 45-50%. The cavity size was normal. Left ventricular diffuse hypokinesis.  2. The right ventricle has normal systolc function. The cavity was normal. There is no increase in right ventricular wall thickness. Right ventricular systolic pressure is normal with an estimated pressure of 30.2 mmHg.  3. The aortic valve is tricuspid Mild sclerosis of the aortic valve. Aortic valve regurgitation is trivial by color flow Doppler.   Discharge Instructions  Discharge Instructions    (HEART FAILURE PATIENTS) Call MD:  Anytime you have any of the following symptoms: 1) 3 pound weight gain in 24 hours or 5 pounds in 1 week 2) shortness of breath, with or without a dry hacking cough 3) swelling in the hands, feet or stomach 4) if you have to sleep on extra pillows at night in order to breathe.   Complete by: As directed    Call MD for:   Complete by: As directed     Heart racing or palpitations.   Call MD for:  difficulty breathing, headache or visual disturbances   Complete by: As directed    Call MD for:  extreme fatigue   Complete by: As directed    Call MD for:  persistant dizziness or light-headedness   Complete by: As directed    Call MD for:  severe uncontrolled pain   Complete by: As directed    Call MD for:  temperature >100.4   Complete by: As directed    Diet - low sodium heart healthy   Complete by: As directed    Increase activity slowly   Complete by: As directed        Medication List    STOP taking these medications   ferrous sulfate 325 (65 FE) MG tablet   GoodSense Aspirin 325 MG tablet Generic drug: aspirin   naproxen sodium 220 MG tablet Commonly known as: ALEVE   oxyCODONE 5 MG immediate release tablet Commonly known as: Oxy IR/ROXICODONE   polyethylene glycol  17 g packet Commonly known as: MIRALAX / GLYCOLAX   tiZANidine 4 MG tablet Commonly known as: ZANAFLEX     TAKE these medications   Aczone 5 % topical gel Generic drug: Dapsone Apply 1 application topically See admin instructions. Apply to the face daily as directed   amoxicillin 500 MG capsule Commonly known as: AMOXIL Take 2,000 mg by mouth See admin instructions. Take 2,000 mg by mouth one hour prior to dental appointments   apixaban 5 MG Tabs tablet Commonly known as: ELIQUIS Take 1 tablet (5 mg total) by mouth 2 (two) times daily.   digoxin 0.125 MG tablet Commonly known as: LANOXIN Take 1 tablet (0.125 mg total) by mouth daily. Start taking on: June 05, 2019   fluticasone 50 MCG/ACT nasal spray Commonly known as: FLONASE Place 1-2 sprays into both nostrils daily as needed (for seasonal allergies).   furosemide 40 MG tablet Commonly known as: LASIX Take 1 tablet (40 mg total) by mouth 2 (two) times daily.   Linseed Oil Oil Take 1 capsule by mouth daily.   loratadine 10 MG tablet Commonly known as: CLARITIN Take 10 mg by  mouth daily.   losartan 25 MG tablet Commonly known as: COZAAR Take 1 tablet (25 mg total) by mouth daily. Start taking on: June 05, 2019   Lutein-Zeaxanthin Tabs Take 1 tablet by mouth daily.   methimazole 5 MG tablet Commonly known as: TAPAZOLE Take 1 tablet (5 mg total) by mouth 2 (two) times daily.   Metoprolol Tartrate 75 MG Tabs Take 75 mg by mouth 2 (two) times daily.   NexIUM 24HR 20 MG capsule Generic drug: esomeprazole Take 20 mg by mouth daily before breakfast.   potassium chloride SA 20 MEQ tablet Commonly known as: K-DUR Take 1 tablet (20 mEq total) by mouth 2 (two) times daily.   RESVERATROL PO Take 1 capsule by mouth daily.   sulfamethoxazole-trimethoprim 800-160 MG per tablet Commonly known as: BACTRIM DS Take 1 tablet by mouth 2 (two) times daily.   tretinoin 0.1 % cream Commonly known as: RETIN-A Apply 1 application topically See admin instructions. Apply to face nightly at bedtime   Vitamin D3 25 MCG (1000 UT) Caps Take 1,000 Units by mouth daily.      Allergies  Allergen Reactions  . Hydrochlorothiazide Other (See Comments) and Hypertension    Wildly fluctuates B/P low-to-high, then high-to-low, in a matter of minutes  . Hydrocodone-Acetaminophen Nausea And Vomiting  . Other Nausea And Vomiting and Other (See Comments)    Stronger pain meds (post-op) caused dehydration and constipation      Procedures/Studies: Ct Angio Chest Pe W And/or Wo Contrast  Result Date: 05/30/2019 CLINICAL DATA:  Acute onset of shortness of breath. Hypoxemia. EXAM: CT ANGIOGRAPHY CHEST WITH CONTRAST TECHNIQUE: Multidetector CT imaging of the chest was performed using the standard protocol during bolus administration of intravenous contrast. Multiplanar CT image reconstructions and MIPs were obtained to evaluate the vascular anatomy. CONTRAST:  67mL OMNIPAQUE IOHEXOL 350 MG/ML SOLN COMPARISON:  Chest x-ray dated 05/30/2019 FINDINGS: Cardiovascular: No pulmonary  emboli. Chronic cardiomegaly. No significant pericardial effusion. Mediastinum/Nodes: No enlarged mediastinal, hilar, or axillary lymph nodes. Thyroid gland, trachea, and esophagus demonstrate no significant findings. Lungs/Pleura: There are extensive bilateral pulmonary infiltrates without effusions. There is peripheral sparing of the pulmonary parenchyma bilaterally suggesting acute pulmonary edema. There is interstitial edema at the lung bases. Upper Abdomen: No acute abnormality. Musculoskeletal: No chest wall abnormality. No acute or significant osseous findings. Review of  the MIP images confirms the above findings. IMPRESSION: 1. No pulmonary emboli. 2. Extensive bilateral pulmonary infiltrates without effusions, likely pulmonary edema. 3. Interstitial edema at the lung bases.  Cardiomegaly. 4. Given the acute onset of probable flash pulmonary edema, the possibility of an inferior wall myocardial infarction should be considered. Electronically Signed   By: Lorriane Shire M.D.   On: 05/30/2019 14:44   Dg Chest Port 1 View  Result Date: 05/30/2019 CLINICAL DATA:  68 year old presenting with acute onset of shortness of breath that began earlier today. Patient is tachycardic. Patient awakened with mid chest pain and tightness yesterday but that has subsequently resolved. Current history of hypertension and asthma. EXAM: PORTABLE CHEST 1 VIEW COMPARISON:  04/30/2014. FINDINGS: Cardiac silhouette upper normal in size to slightly enlarged for AP portable technique. Thoracic aorta atherosclerotic. Hilar and mediastinal contours otherwise unremarkable. Diffuse interstitial pulmonary edema with Kerley B lines present diffusely bilaterally. Asymmetric airspace opacities at the RIGHT lung base. Possible small RIGHT pleural effusion. IMPRESSION: 1. Mild CHF, with borderline to mild cardiomegaly and mild diffuse interstitial pulmonary edema. 2. Asymmetric airspace opacities at the RIGHT lung base which may represent  asymmetric airspace edema or pneumonia. Possible small RIGHT pleural effusion. Electronically Signed   By: Evangeline Dakin M.D.   On: 05/30/2019 12:37      Subjective: Patient feels well.  Denies complaints.  No dyspnea, chest pain, palpitations, dizziness or lightheadedness.  Reports that she ambulated several times in her room yesterday and was asymptomatic.  Discharge Exam:  Vitals:   06/04/19 0451 06/04/19 0505 06/04/19 1039 06/04/19 1415  BP: 131/90  125/66 133/70  Pulse: (!) 110  87 76  Resp: 16     Temp: 98.1 F (36.7 C)     TempSrc: Oral     SpO2: 96%     Weight:  95.2 kg    Height:        General exam: Pleasant middle-aged female, moderately built and morbidly obese lying comfortably propped up in bed without distress.    Excited to go home today. Respiratory system: Clear to auscultation.  No increased work of breathing. Cardiovascular system: Si S1 heard, Irregularly irregular and mild tachycardic.  No JVD or murmurs.    No ankle edema.  Telemetry personally reviewed, A. fib reverted to sinus rhythm this morning at 6:35 AM. Gastrointestinal system: Abdomen is nondistended, soft and nontender. No organomegaly or masses felt. Normal bowel sounds heard. Central nervous system: Alert and oriented. No focal neurological deficits. Extremities: Symmetric 5 x 5 power. Skin: No rashes, lesions or ulcers Psychiatry: Judgement and insight appear normal. Mood & affect appropriate.     The results of significant diagnostics from this hospitalization (including imaging, microbiology, ancillary and laboratory) are listed below for reference.     Microbiology: Recent Results (from the past 240 hour(s))  SARS Coronavirus 2 [facility] order, Performed in [facility] hospital lab) Nasopharyngeal Nasopharyngeal Swab     Status: None   Collection Time: 05/30/19 11:31 AM   Specimen: Nasopharyngeal Swab  Result Value Ref Range Status   SARS Coronavirus 2 NEGATIVE NEGATIVE Final     Comment: (NOTE) If result is NEGATIVE SARS-CoV-2 target nucleic acids are NOT DETECTED. The SARS-CoV-2 RNA is generally detectable in upper and lower  respiratory specimens during the acute phase of infection. The lowest  concentration of SARS-CoV-2 viral copies this assay can detect is 250  copies / mL. A negative result does not preclude SARS-CoV-2 infection  and should not be  used as the sole basis for treatment or other  patient management decisions.  A negative result may occur with  improper specimen collection / handling, submission of specimen other  than nasopharyngeal swab, presence of viral mutation(s) within the  areas targeted by this assay, and inadequate number of viral copies  (<250 copies / mL). A negative result must be combined with clinical  observations, patient history, and epidemiological information. If result is POSITIVE SARS-CoV-2 target nucleic acids are DETECTED. The SARS-CoV-2 RNA is generally detectable in upper and lower  respiratory specimens dur ing the acute phase of infection.  Positive  results are indicative of active infection with SARS-CoV-2.  Clinical  correlation with patient history and other diagnostic information is  necessary to determine patient infection status.  Positive results do  not rule out bacterial infection or co-infection with other viruses. If result is PRESUMPTIVE POSTIVE SARS-CoV-2 nucleic acids MAY BE PRESENT.   A presumptive positive result was obtained on the submitted specimen  and confirmed on repeat testing.  While 2019 novel coronavirus  (SARS-CoV-2) nucleic acids may be present in the submitted sample  additional confirmatory testing may be necessary for epidemiological  and / or clinical management purposes  to differentiate between  SARS-CoV-2 and other Sarbecovirus currently known to infect humans.  If clinically indicated additional testing with an alternate test  methodology [phone]) is advised. The SARS-CoV-2  RNA is generally  detectable in upper and lower respiratory sp ecimens during the acute  phase of infection. The expected result is Negative. Fact Sheet for Patients:  StrictlyIdeas.no Fact Sheet for Healthcare Providers: BankingDealers.co.za This test is not yet approved or cleared by the [place] FDA and has been authorized for detection and/or diagnosis of SARS-CoV-2 by FDA under an Emergency Use Authorization (EUA).  This EUA will remain in effect (meaning this test can be used) for the duration of the COVID-19 declaration under Section 564(b)(1) of the Act, 21 U.S.C. section 360bbb-3(b)(1), unless the authorization is terminated or revoked sooner. Performed at [facility], [place] [address], [place], [place] [address]      Labs: CBC: Recent Labs  Lab 05/30/19 1134 05/30/19 1852 05/31/19 0027 05/31/19 1443 05/31/19 1446 06/01/19 0507  WBC 9.1 10.9* 12.0*  --   --  7.8  NEUTROABS 7.4  --   --   --   --   --   HGB 14.6 14.1 13.3 12.9 13.3 12.1  HCT 45.2 43.5 39.9 38.0 39.0 36.6  MCV 85.9 85.3 84.5  --   --  84.1  PLT 292 298 267  --   --  0000000   Basic Metabolic Panel: Recent Labs  Lab 05/30/19 1852 05/31/19 0027  05/31/19 1446 06/01/19 0507 06/01/19 1111 06/02/19 0420 06/03/19 0643 06/04/19 0514  NA  --  134*   < > 137 136  --  137 138 137  K  --  3.6   < > 3.3* 3.2*  --  3.8 3.5 3.5  CL  --  98  --   --  100  --  102 100 98  CO2  --  23  --   --  24  --  24 24 27   GLUCOSE  --  137*  --   --  97  --  107* 105* 102*  BUN  --  13  --   --  15  --  15 15 17   CREATININE 1.07* 1.04*  --   --  1.06*  --  1.06* 1.19* 1.14*  CALCIUM  --  8.6*  --   --  8.7*  --  9.3 9.3 9.3  MG 1.7  --   --   --   --  1.6*  --  1.8  --   PHOS 4.4  --   --   --   --   --   --   --   --    < > = values in this interval not displayed.   Liver Function Tests: Recent Labs  Lab 05/30/19 1134  AST 20  ALT 21  ALKPHOS 102   BILITOT 0.3  PROT 6.7  ALBUMIN 3.5   BNP (last 3 results) Recent Labs    05/30/19 1134  BNP 243.5*     Time coordinating discharge: 50 minutes  SIGNED:  Vernell Leep, MD, FACP, [facility]. [facility]  To contact the attending provider between 7A-7P or the covering provider during after hours 7P-7A, please log into the web site [email or web address] and access using universal [place] password for that web site. If you do not have the password, please call the hospital operator.

## 2019-06-04 NOTE — Progress Notes (Addendum)
Progress Note  Patient Name: Christina [name] Date of Encounter: 06/04/2019  Primary Cardiologist: Minus Breeding, MD   Subjective   No chest pain or SOB.  Does not feel any palpitations. Converted to NSR this am  Inpatient Medications    Scheduled Meds: . apixaban  5 mg Oral BID  . atorvastatin  80 mg Oral q1800  . digoxin  0.125 mg Oral Daily  . furosemide  40 mg Oral BID  . methimazole  5 mg Oral BID  . metoprolol tartrate  75 mg Oral BID  . pantoprazole  40 mg Oral Daily  . potassium chloride  40 mEq Oral Daily  . sodium chloride flush  3 mL Intravenous Q12H  . sodium chloride flush  3 mL Intravenous Q12H   Continuous Infusions: . sodium chloride 250 mL (06/03/19 1632)   PRN Meds: sodium chloride, guaiFENesin-dextromethorphan, ondansetron **OR** ondansetron (ZOFRAN) IV, sodium chloride flush   Vital Signs    Vitals:   06/03/19 1534 06/03/19 1955 06/04/19 0451 06/04/19 0505  BP:  (!) 116/96 131/90   Pulse: (!) 119 (!) 102 (!) 110   Resp:  15 16   Temp:  97.8 F (36.6 C) 98.1 F (36.7 C)   TempSrc:  Oral Oral   SpO2:  99% 96%   Weight:    95.2 kg  Height:        Intake/Output Summary (Last 24 hours) at 06/04/2019 0827 Last data filed at 06/04/2019 0820 Gross per 24 hour  Intake 727.97 ml  Output 1901 ml  Net -1173.03 ml   Last 3 Weights 06/04/2019 06/03/2019 06/02/2019  Weight (lbs) 209 lb 14.4 oz 210 lb 14.4 oz 213 lb 11.2 oz  Weight (kg) 95.21 kg 95.664 kg 96.934 kg      Telemetry    NSR in the 80's. Personally Reviewed  ECG    No new EKG to review- Personally Reviewed  Physical Exam   GEN: Well nourished, well developed in no acute distress HEENT: Normal NECK: No JVD; No carotid bruits LYMPHATICS: No lymphadenopathy CARDIAC:  RRR, no murmurs, rubs, gallops RESPIRATORY:  Clear to auscultation without rales, wheezing or rhonchi  ABDOMEN: Soft, non-tender, non-distended MUSCULOSKELETAL:  No edema; No deformity  SKIN: Warm and dry NEUROLOGIC:   Alert and oriented x 3 PSYCHIATRIC:  Normal affect    Labs    High Sensitivity Troponin:   Recent Labs  Lab 05/30/19 1134 05/30/19 1449  TROPONINIHS 108* 578*      Chemistry Recent Labs  Lab 05/30/19 1134  06/02/19 0420 06/03/19 0643 06/04/19 0514  NA 139   < > 137 138 137  K 4.2   < > 3.8 3.5 3.5  CL 107   < > 102 100 98  CO2 19*   < > 24 24 27   GLUCOSE 130*   < > 107* 105* 102*  BUN 12   < > 15 15 17   CREATININE 1.04*   < > 1.06* 1.19* 1.14*  CALCIUM 9.1   < > 9.3 9.3 9.3  PROT 6.7  --   --   --   --   ALBUMIN 3.5  --   --   --   --   AST 20  --   --   --   --   ALT 21  --   --   --   --   ALKPHOS 102  --   --   --   --   BILITOT 0.3  --   --   --   --  GFRNONAA 56*   < > 55* 48* 50*  GFRAA >60   < > >60 55* 58*  ANIONGAP 13   < > 11 14 12    < > = values in this interval not displayed.     Hematology Recent Labs  Lab 05/30/19 1852 05/31/19 0027 05/31/19 1443 05/31/19 1446 06/01/19 0507  WBC 10.9* 12.0*  --   --  7.8  RBC 5.10 4.72  --   --  4.35  HGB 14.1 13.3 12.9 13.3 12.1  HCT 43.5 39.9 38.0 39.0 36.6  MCV 85.3 84.5  --   --  84.1  MCH 27.6 28.2  --   --  27.8  MCHC 32.4 33.3  --   --  33.1  RDW 14.2 14.1  --   --  14.2  PLT 298 267  --   --  233    BNP Recent Labs  Lab 05/30/19 1134  BNP 243.5*     DDimer No results for input(s): DDIMER in the last 168 hours.   Radiology    No results found.  Cardiac Studies   2D Echo 05/31/2019  IMPRESSIONS   1. The left ventricle has moderate-severely reduced systolic function, with an ejection fraction of 30-35%. The cavity size was normal. There is moderately increased left ventricular wall thickness. Left ventricular diastolic function could not be  evaluated secondary to atrial fibrillation. Left ventricular diffuse hypokinesis. 2. Severe akinesis of the left ventricular, entire anterior wall, anteroseptal wall and apical segment. 3. The right ventricle has normal systolic function. The  cavity was normal. There is no increase in right ventricular wall thickness. 4. The mitral valve is abnormal. Mild thickening of the mitral valve leaflet. 5. The aortic valve is tricuspid. Mild sclerosis of the aortic valve. No stenosis of the aortic valve. 6. The aorta is abnormal unless otherwise noted. 7. There is mild dilatation of the ascending aorta measuring 38 mm. 8. The inferior vena cava was normal in size with <50% respiratory variability. 9. The tricuspid valve is grossly normal.  [facility] 05/31/19  Conclusion    There is mild left ventricular systolic dysfunction.  LV end diastolic pressure is normal.  The left ventricular ejection fraction is 50-55% by visual estimate.  1. Normal coronary anatomy 2. Low normal to mildly reduced LV function. Assessment limited by Afib. 3. Normal right heart pressures  4. LV filling pressure are upper normal 5. Cardiac index 2.37.  Plan: medical management. Would avoid using right radial access in the future.     RHC Data Hemo Data   Most Recent Value  Fick Cardiac Output 4.63 L/min  Fick Cardiac Output Index 2.37 (L/min)/BSA  RA A Wave 10 mmHg  RA V Wave 10 mmHg  RA Mean 8 mmHg  RV Systolic Pressure 36 mmHg  RV Diastolic Pressure 1 mmHg  RV EDP 10 mmHg  PA Systolic Pressure 38 mmHg  PA Diastolic Pressure 3 mmHg  PA Mean 22 mmHg  PW A Wave 22 mmHg  PW V Wave 21 mmHg  PW Mean 19 mmHg  AO Systolic Pressure [id number] mmHg  AO Diastolic Pressure 67 mmHg  AO Mean 85 mmHg  LV Systolic Pressure 123XX123 mmHg  LV Diastolic Pressure 0 mmHg  LV EDP 16 mmHg  AOp Systolic Pressure [id number] mmHg  AOp Diastolic Pressure 62 mmHg  AOp Mean Pressure 84 mmHg  LVp Systolic Pressure 123456 mmHg  LVp Diastolic Pressure 3 mmHg  LVp EDP Pressure 9 mmHg  QP/QS 1  TPVR Index 9.28 HRUI  TSVR Index 35.84 HRUI  PVR SVR Ratio 0.04  TPVR/TSVR Ratio 0.26    Patient Profile     Christina [name] is a 68yo F with a hx of HTN, GERD, remote asthma and  arthritis and a family h/o hemochromatosis, who presented to the ED after acute onset of SOB which occurred while shopping at [employer]>>ED w/u c/w acute CHF, elevated hs Troponin and now new onset atrial fibrillation w/ RVR.  Assessment & Plan    1. Acute Systolic CHF/ NICM:presented w/ acute dyspnea>>pulmonary edema. BNP 243 on admit. 2D echo showed moderately to severely reduced EF at 30-35%, however EF noted to be ~50-55% by visual estimate on cath.  Given discrepancy between initial 2D echo EF assessment and cardiac cath estimate, recommend repeat limited Echo, once her HR has improved, to reassess EF, as measurement will dictate further management and decision regarding guide lines therapy for systolic HF  If EF still reduced, consider addition of an ACE/ARB/ARNI. Continue  blocker +/- digoxin  Significant symptomatic improvement w/ IV Lasix. No resting dyspnea. No orthopnea. No edema on exam.  Now on Lasix 40mg   PO BID  UOP good - she put out 1.9L yesterday and is net neg 3.9L since admit  LVEDP normal on cath at 9 mmHg and RA pressure also normal at 8 mmH  Scr remains stable at 1.14.  Continue low salt diet  Replete K+ (3.5) to keep > 4.  Continue Lasix, BB   Repeat 2D echo today now that in NSR to get better estimate of LVF.  If EF < 45% will add low dose ARB  2. Atrial Fibrillation w/ RVR: new onset. Sinus tach on admit. Went into rapid afib w/ rates in the 130s-140s on 05/31/19 around 0200. Initially given IV metoprolol w/ little improvement. Had sustained rates in the 130s-140s >>Started on IV Cardizem drip but subsequently stopped due to LV dysfunction.   Hemodynamically stable w/ BP 131/63mmHg.  BB increased yesterday and dig added and converted to NSR this am.  Continue Lopressor to 75mg  BID and continue dig at 0.125mg  daily (given age > 52 would not increase dig further)  Continue methimazole to treat hyperthyroidism  2D echo w/ reduced EF but normal on cath. See  above. RA and LA normal size  Cath ruled out underlying coronary ischemia. No CAD.  Recommend avoidance of long term Cardizem use given reduced LVF.   CHA2DS2 VASc scoreis at least 3 for CHF, Age 52-74 and female sex.This patients CHA2DS2-VASc Score and unadjusted Ischemic Stroke Rate (% per year) is equal to3.2 % stroke rate/year from a score of 3.  Continue Eliquis 5 mg BID.  Given obesity/ body habitus, consider outpatient sleep study to r/o OSA.    3. Elevated Hs Troponin: 108>>578.  LHC 9/9 showed no CAD. Normal coronaries. 2/2 demand ischemia from acute CHF and rapid afib   4. Hyperthyroidism: TSH extremely low at <0.010. F/u Free T3 elevated at 5.5 and Free T4 elevated at 1.86. Sister also has Graves disease, per pt report.   Methimazole 5mg  BID started 9/10  Continue ? blocker for HR control and management of afib.   F/u with endocrinology post d/c  5. Renal Insuffiency: ? Acute vs chronic, as last baseline labs were 5 years ago. SCr 1.0 on admit (previously 0.6). GFr 55 mL/min  Creatinine remains stable at 1.14.  6. Hypokalemia:  3.2>>3.8>>3.5>>3.5 today  Given her atrial fibrillation  need to keep K>4.0  Will increase  Kdur to 44meq BID  Repeat BMET in am  For questions or updates, please contact [place] Please consult [email or web address] for contact info under       Signed, Fransico Him, MD  06/04/2019, 8:27 AM

## 2019-06-05 ENCOUNTER — Telehealth: Payer: Self-pay | Admitting: Cardiology

## 2019-06-05 ENCOUNTER — Telehealth: Payer: Self-pay | Admitting: Endocrinology

## 2019-06-05 NOTE — Telephone Encounter (Signed)
-----   Message from Elayne Snare, MD sent at 06/04/2019  6:59 PM EDT ----- Thanks Everlene Farrier  I am copying this note to my office staff to schedule 30-45 min Consult urgently for hyperthyroidism.  Dr Maretta Bees could see if I am totally booked  AK ----- Message ----- From: Modena Jansky, MD Sent: 06/04/2019   3:05 PM EDT To: Elayne Snare, MD  Hi Ajay,  Currently arrange close outpatient follow-up with you in the office for this patient who I had discussed with you last week.  She has hyperthyroidism/Graves' disease.  Newly started on methimazole.  I have forwarded her hospital discharge summary to you.  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  Although she lives in [place], she wishes to see you in [place] office.  Thank you  Regards Everlene Farrier

## 2019-06-05 NOTE — Telephone Encounter (Signed)
Appointment scheduled.

## 2019-06-05 NOTE — Telephone Encounter (Signed)
Will route to Dr. Turner for review.  

## 2019-06-05 NOTE — Telephone Encounter (Signed)
She is stable from a cardiac standpoint to return to work

## 2019-06-05 NOTE — Telephone Encounter (Signed)
Patient called stating she was discharged from the hospital yesterday.  She had been as working since this morning, however her employer has now told her she needs a letter stating that she can return to work. She states that Dr. Radford Pax did her discharge yesterday at the hospital.

## 2019-06-05 NOTE — Telephone Encounter (Signed)
Letter to be faxed to pt [phone] attn: Noria Buglione to return to work.. 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   Patient contacted regarding discharge from [facility] on 06/04/19.  Patient understands to follow up with provider Kathyrn Drown NP on 06/12/19 at 1:30 pm at [facility]. Patient understands discharge instructions? yes Patient understands medications and regiment? yes Patient understands to bring all medications to this visit? yes

## 2019-06-08 ENCOUNTER — Encounter: Payer: Self-pay | Admitting: Endocrinology

## 2019-06-08 ENCOUNTER — Ambulatory Visit (INDEPENDENT_AMBULATORY_CARE_PROVIDER_SITE_OTHER): Payer: Medicare Other | Admitting: Endocrinology

## 2019-06-08 ENCOUNTER — Other Ambulatory Visit: Payer: Self-pay

## 2019-06-08 VITALS — BP 110/60 | HR 85 | Ht 61.0 in | Wt 209.4 lb

## 2019-06-08 DIAGNOSIS — E0590 Thyrotoxicosis, unspecified without thyrotoxic crisis or storm: Secondary | ICD-10-CM | POA: Diagnosis not present

## 2019-06-08 DIAGNOSIS — I5083 High output heart failure: Secondary | ICD-10-CM

## 2019-06-08 NOTE — Progress Notes (Addendum)
Patient ID: Christina [name], female   DOB: 06-Apr-1951, 68 y.o.   MRN: [id number]                                                                                                               Reason for Appointment:  Hyperthyroidism, new consultation  Referring healthcare provider: Hospitalist   Chief complaint: Weakness   History of Present Illness:   For the last several years the patient has had symptoms of occasional palpitations, shortness of breath on exertion, shakiness and feeling somewhat warm.  However she did not have any recent worsening of the symptoms except on the morning of hospitalization She previously did not have any unusual weight loss, anxiety or fatigue  She was also having some swelling of her legs prior to admission Since she was having difficulties with tachycardia and atrial fibrillation her thyroid level was checked in the hospital and indicated hyperthyroidism  Telephone consultation was obtained by the hospitalist with myself and she is here now for further follow-up. She has been started on methimazole 5 mg twice daily as recommended Also on 75 mg of metoprolol twice daily  Although she feels better with her breathing and palpitations she now is complaining of feeling weak, has minimal lightheadedness also Also is complaining of excessive cold intolerance  Wt Readings from Last 3 Encounters:  06/08/19 209 lb 6.4 oz (95 kg)  06/04/19 209 lb 14.4 oz (95.2 kg)  06/05/14 200 lb (90.7 kg)     Thyroid function tests as follows:     Lab Results  Component Value Date   FREET4 1.86 (H) 05/31/2019   T3FREE 5.5 (H) 05/31/2019   TSH <0.010 (L) 05/30/2019    Lab Results  Component Value Date   THYROTRECAB 3.92 (H) 06/01/2019     Allergies as of 06/08/2019      Reactions   Hydrochlorothiazide Other (See Comments), Hypertension   Wildly fluctuates B/P low-to-high, then high-to-low, in a matter of minutes   Hydrocodone-acetaminophen Nausea And Vomiting   Other Nausea And Vomiting, Other (See Comments)   Stronger pain meds (post-op) caused dehydration and constipation      Medication List       Accurate as of June 08, 2019  4:24 PM. If you have any questions, ask your nurse or doctor.        Aczone 5 % topical gel Generic drug: Dapsone Apply 1 application topically See admin instructions. Apply to the face daily as directed   amoxicillin 500 MG capsule Commonly known as: AMOXIL Take 2,000 mg by mouth See admin instructions. Take 2,000 mg by mouth one hour prior to dental appointments   apixaban 5 MG Tabs tablet Commonly known as: ELIQUIS Take 1 tablet (5 mg total) by mouth 2 (two) times daily.   digoxin 0.125 MG tablet Commonly known as: LANOXIN Take 1 tablet (0.125 mg total) by mouth daily.   fluticasone 50 MCG/ACT nasal spray Commonly known as: FLONASE Place 1-2 sprays into both nostrils daily as needed (for seasonal allergies).  furosemide 40 MG tablet Commonly known as: LASIX Take 1 tablet (40 mg total) by mouth 2 (two) times daily.   Linseed Oil Oil Take 1 capsule by mouth daily.   loratadine 10 MG tablet Commonly known as: CLARITIN Take 10 mg by mouth daily.   losartan 25 MG tablet Commonly known as: COZAAR Take 1 tablet (25 mg total) by mouth daily.   Lutein-Zeaxanthin Tabs Take 1 tablet by mouth daily.   methimazole 5 MG tablet Commonly known as: TAPAZOLE Take 1 tablet (5 mg total) by mouth 2 (two) times daily.   Metoprolol Tartrate 75 MG Tabs Take 75 mg by mouth 2 (two) times daily.   NexIUM 24HR 20 MG capsule Generic drug: esomeprazole Take 20 mg by mouth daily before breakfast.   potassium chloride SA 20 MEQ tablet Commonly known as: K-DUR Take 1 tablet (20 mEq total) by mouth 2 (two) times daily.   RESVERATROL PO Take 1 capsule by mouth daily.   sulfamethoxazole-trimethoprim 800-160 MG per tablet Commonly known as: BACTRIM DS Take 1 tablet by mouth 2 (two) times daily.    tretinoin 0.1 % cream Commonly known as: RETIN-A Apply 1 application topically See admin instructions. Apply to face nightly at bedtime   Vitamin D3 25 MCG (1000 UT) Caps Take 1,000 Units by mouth daily.           Past Medical History:  Diagnosis Date  . 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 Asthma    hx of - PT STATES MILD - NO LONGER REQUIRES INHALERS  . Colitis    mild   . GERD (gastroesophageal reflux disease)   . H/O hiatal hernia   . Hypertension    hx of 10 years ago ; PT'S BLOOD PRESSURE RECENTLY ELEVATED WHILE IN HOSP FOR SURG- NOT ON ANY B/P MEDS    Past Surgical History:  Procedure Laterality Date  . KNEE CLOSED REDUCTION Left 06/05/2014   Procedure: CLOSED MANIPULATION KNEE;  Surgeon: Mauri Pole, MD;  Location: [facility];  Service: Orthopedics;  Laterality: Left;  . RIGHT/LEFT HEART CATH AND CORONARY ANGIOGRAPHY N/A 05/31/2019   Procedure: RIGHT/LEFT HEART CATH AND CORONARY ANGIOGRAPHY;  Surgeon: Martinique, Peter M, MD;  Location: [facility];  Service: Cardiovascular;  Laterality: N/A;  . TOTAL KNEE ARTHROPLASTY Left 05/07/2014   Procedure: LEFT TOTAL KNEE ARTHROPLASTY;  Surgeon: Mauri Pole, MD;  Location: [facility];  Service: Orthopedics;  Laterality: Left;  . TOTAL KNEE ARTHROPLASTY Right 06/05/2014   Procedure: RIGHT TOTAL KNEE ARTHROPLASTY;  Surgeon: Mauri Pole, MD;  Location: [facility];  Service: Orthopedics;  Laterality: Right;  . WISDOM TEETH EXTRACTIONS      History reviewed. No pertinent family history.  Social History:  reports that she has never smoked. She has never used smokeless tobacco. She reports that she does not drink alcohol or use drugs.  Allergies:  Allergies  Allergen Reactions  . Hydrochlorothiazide Other (See Comments) and Hypertension    Wildly fluctuates B/P low-to-high, then high-to-low, in a matter of minutes  . Hydrocodone-Acetaminophen Nausea And Vomiting   . Other Nausea And Vomiting and Other (See Comments)    Stronger pain meds (post-op) caused dehydration and constipation     Review of Systems  Constitutional: Positive for diaphoresis. Negative for weight loss.  HENT:       Occasional periodic difficulty with swallowing, today had some difficulty swallowing water  Eyes:       No dryness or irritation of the eyes  Respiratory: Positive for shortness of breath.   Cardiovascular: Positive for palpitations and leg swelling.       She started having leg swelling about 3 weeks prior to admission  Gastrointestinal: Negative for constipation and diarrhea.  Endocrine: Positive for cold intolerance. Negative for light-headedness.  Musculoskeletal: Positive for joint pain.  Skin: Negative for itching.  Neurological: Positive for weakness and tremors.   She has been on Lasix 40 mg daily and potassium supplements  Lab Results  Component Value Date   K 3.5 06/04/2019   No significant history of hypertension in the past and now is on 25 mg losartan for recent history of CHF  BP Readings from Last 3 Encounters:  06/08/19 110/60  06/04/19 133/70  06/07/14 135/83       Examination:   BP 110/60 (BP Location: Left Arm, Patient Position: Sitting, Cuff Size: Normal)   Pulse 85   Ht 5\' 1"  (1.549 m)   Wt 209 lb 6.4 oz (95 kg)   SpO2 97%   BMI 39.57 kg/m    General Appearance:  well-built and nourished, pleasant, not anxious or hyperkinetic.         Eyes: No abnormal prominence, lid lag or stare present.  No swelling of the eyelids   Neck: The thyroid is not palpable  There is no lymphadenopathy in the neck .           Heart:  Rhythm is at times irregular.  Heart rate about 80-90 Normal S1 and S2, no murmurs .           Lungs: breath sounds are normal bilaterally without added sounds  Abdomen: Not examined, not indicated   Extremities: hands are not unusually warm. No ankle edema.  Neurological:  No fine tremors are present.  Deep tendon reflexes at biceps are slightly brisk.  Skin: No rash, abnormal thickening of the skin on the lower legs seen     Assessment/Plan:   Hyperthyroidism, from Graves' disease   Although her presentation is somewhat atypical and she did not have usual symptoms prior to her hospitalization for CHF and atrial fibrillation she does have mild increase in her thyroid levels This is not associated with a goiter Her thyrotropin receptor antibody is only modestly increased She does have a strong family history of autoimmune thyroid disease   Discussed with the patient the hyperthyroidism is a result of an autoimmune condition involving the thyroid.  Explained that the options for treatment are basically antithyroid drugs and radioactive iodine.  Discussed the pros and cons for each treatment: Antithyroid drugs would be reasonable for mild disease but would need frequent followup with lab monitoring as well as potential for side effects from the medications and uncertainty about long-term cure of the problem Discussed that I-131 treatment is safe and simple to do but will result in long-term hypothyroidism that will result from ablation of the thyroid tissue and the need for lifelong supplementation and periodic monitoring.  For her it would be appropriate for the patient with antithyroid drugs at this time with methimazole considering the overall mild degree of hyperthyroidism without a goiter  Since she has just started methimazole will recheck her thyroid levels to make sure they are decreasing although unlikely to be normal as yet.  Patient brochure on hyperthyroidism was given Patient understands the above discussion and treatment options. All questions were answered satisfactorily  WEAKNESS: This may  be related to her blood pressure being relatively low or possibly hypokalemia related to diuretics Today's labs will include magnesium and electrolytes She can hold losartan until seen  by cardiologist next week  Consult note sent to referring physician  Elayne Snare 06/08/2019, 4:24 PM    Note: This office note was prepared with Dragon voice recognition system technology. Any transcriptional errors that result from this process are unintentional.

## 2019-06-08 NOTE — Patient Instructions (Signed)
No Losartan

## 2019-06-09 ENCOUNTER — Telehealth: Payer: Self-pay | Admitting: Cardiology

## 2019-06-09 ENCOUNTER — Telehealth: Payer: Self-pay

## 2019-06-09 DIAGNOSIS — Z79899 Other long term (current) drug therapy: Secondary | ICD-10-CM

## 2019-06-09 DIAGNOSIS — N289 Disorder of kidney and ureter, unspecified: Secondary | ICD-10-CM

## 2019-06-09 LAB — T4, FREE: Free T4: 1.81 ng/dL — ABNORMAL HIGH (ref 0.60–1.60)

## 2019-06-09 LAB — CBC
HCT: 44.4 % (ref 36.0–46.0)
Hemoglobin: 14.5 g/dL (ref 12.0–15.0)
MCHC: 32.6 g/dL (ref 30.0–36.0)
MCV: 85.4 fl (ref 78.0–100.0)
Platelets: 371 10*3/uL (ref 150.0–400.0)
RBC: 5.19 Mil/uL — ABNORMAL HIGH (ref 3.87–5.11)
RDW: 14 % (ref 11.5–15.5)
WBC: 5.6 10*3/uL (ref 4.0–10.5)

## 2019-06-09 LAB — BASIC METABOLIC PANEL
BUN: 24 mg/dL — ABNORMAL HIGH (ref 6–23)
CO2: 29 mEq/L (ref 19–32)
Calcium: 10.2 mg/dL (ref 8.4–10.5)
Chloride: 98 mEq/L (ref 96–112)
Creatinine, Ser: 1.41 mg/dL — ABNORMAL HIGH (ref 0.40–1.20)
GFR: 37.34 mL/min — ABNORMAL LOW (ref >60.00)
Glucose, Bld: 107 mg/dL — ABNORMAL HIGH (ref 70–99)
Potassium: 4.8 mEq/L (ref 3.5–5.1)
Sodium: 137 mEq/L (ref 135–145)

## 2019-06-09 LAB — T3, FREE: T3, Free: 3.5 pg/mL (ref 2.3–4.2)

## 2019-06-09 LAB — MAGNESIUM: Magnesium: 2.2 mg/dL (ref 1.5–2.5)

## 2019-06-09 LAB — ALT: ALT: 15 U/L (ref 0–35)

## 2019-06-09 MED ORDER — FUROSEMIDE 40 MG PO TABS: 40.0000 mg | ORAL_TABLET | Freq: Every day | ORAL | 3 refills | Status: DC

## 2019-06-09 MED ORDER — POTASSIUM CHLORIDE CRYS ER 20 MEQ PO TBCR: 20.0000 meq | EXTENDED_RELEASE_TABLET | Freq: Every day | ORAL | 3 refills | Status: DC

## 2019-06-09 NOTE — Telephone Encounter (Signed)
Notes recorded by Frederik Schmidt, RN on 06/09/2019 at 3:09 PM EDT  [id number] 9/18  ------

## 2019-06-09 NOTE — Progress Notes (Signed)
Cardiology Office Note   Date:  06/12/2019   ID:  Christina [name], Christina [name] 01/30/1951, MRN [id number]  PCP:  Mateo Flow, MD  Cardiologist:  Dr. Percival Spanish, MD   Chief Complaint  Patient presents with  . Hospitalization Follow-up    History of Present Illness: Christina [name] is a 68 y.o. female with a hx of HTN, GERD, remote asthma and arthritisand a family h/ohemochromatosis,who presented to the ED on 05/31/2019 after acute onset of SOB which occurred while shopping at [facility]. In the she was noted to have acute on chronic HF symptoms, elevated HsT and new onset atrial fibrillation w/ RVR.  On admission, BMP was 243.  Echocardiogram showed moderate to severe reduced LVEF at 30 to 35%. She underwent a cardiac catheterization on 05/31/2019 with normal coronary anatomy and a low normal to mildly reduced LV function.  Assessment was limited by atrial fibrillation, however EF was visually estimated at 50 to 55%.  Given the discrepancy between echo on 05/31/2019 with catheter report a repeat limited echocardiogram was performed on 06/04/2019 that showed an LVEF of 45 to 50%.  Will need to consider ACE/ARB/ARNI if LVEF less than 45%.  She was continued on beta-blocker.  She was discharged on Lasix 40 mg p.o. twice daily.  Also had issues with new onset atrial fibrillation in which she was started on IV diltiazem however this was subsequently stopped due to LV dysfunction.  Her beta-blocker was titrated and digoxin was added to her regimen with spontaneous conversion to NSR however digoxin was ultimately stopped prior to hospital discharge.  She was continued on Lopressor 75 mg twice daily. She was continued on methimazole to treat her hyperthyroidism. Also started on Eliquis 5 mg p.o. twice daily.  Today she presents for hospital follow-up and is doing well from a cardiac perspective.  She reports that soon after discharge she was feeling very fatigued.  She was seen by her endocrinologist and diagnosed  with Graves' disease.  They did lab work at that time which showed an elevated creatinine.  On 06/04/2019 her creatinine was 1.14 and was 1.41 on 06/08/2019.  Her losartan and digoxin were stopped per endocrinology.  Since that time, she reports she feels much better.  She was continued on Lasix 40 mg daily, likely this was too much Lasix for her to tolerate.  She has follow-up lab work 06/16/2019.  She denies recurrent palpitations, shortness of breath LE swelling, orthopnea symptoms, dizziness or syncope.  She states her swelling has not returned since hospital discharge.  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  Past Medical History:  Diagnosis Date  . 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 Asthma    hx of - PT STATES MILD - NO LONGER REQUIRES INHALERS  . Colitis    mild   . GERD (gastroesophageal reflux disease)   . H/O hiatal hernia   . Hypertension    hx of 10 years ago ; PT'S BLOOD PRESSURE RECENTLY ELEVATED WHILE IN HOSP FOR SURG- NOT ON ANY B/P MEDS  . Thyroid disease     Past Surgical History:  Procedure Laterality Date  . KNEE CLOSED REDUCTION Left 06/05/2014   Procedure: CLOSED MANIPULATION KNEE;  Surgeon: Mauri Pole, MD;  Location: [facility];  Service: Orthopedics;  Laterality: Left;  . RIGHT/LEFT HEART CATH AND CORONARY ANGIOGRAPHY N/A 05/31/2019   Procedure: RIGHT/LEFT  HEART CATH AND CORONARY ANGIOGRAPHY;  Surgeon: Martinique, Peter M, MD;  Location: [place] [facility];  Service: Cardiovascular;  Laterality: N/A;  . TOTAL KNEE ARTHROPLASTY Left 05/07/2014   Procedure: LEFT TOTAL KNEE ARTHROPLASTY;  Surgeon: Mauri Pole, MD;  Location: [facility];  Service: Orthopedics;  Laterality: Left;  . TOTAL KNEE ARTHROPLASTY Right 06/05/2014   Procedure: RIGHT TOTAL KNEE ARTHROPLASTY;  Surgeon: Mauri Pole, MD;  Location: [facility];  Service: Orthopedics;  Laterality: Right;  . WISDOM TEETH EXTRACTIONS       Current  Outpatient Medications  Medication Sig Dispense Refill  . amoxicillin (AMOXIL) 500 MG capsule Take 2,000 mg by mouth See admin instructions. Take 2,000 mg by mouth one hour prior to dental appointments    . apixaban (ELIQUIS) 5 MG TABS tablet Take 1 tablet (5 mg total) by mouth 2 (two) times daily. 60 tablet 0  . Cholecalciferol (VITAMIN D3) 25 MCG (1000 UT) CAPS Take 1,000 Units by mouth daily.     . Dapsone (ACZONE) 5 % topical gel Apply 1 application topically See admin instructions. Apply to the face daily as directed    . Flaxseed Oil (LINSEED OIL) OIL Take 1 capsule by mouth daily.     Marland Kitchen loratadine (CLARITIN) 10 MG tablet Take 10 mg by mouth daily.    . methimazole (TAPAZOLE) 5 MG tablet Take 1 tablet (5 mg total) by mouth 2 (two) times daily. 60 tablet 0  . potassium chloride SA (K-DUR) 20 MEQ tablet Take 1 tablet (20 mEq total) by mouth daily. 90 tablet 3  . sulfamethoxazole-trimethoprim (BACTRIM DS) 800-160 MG per tablet Take 1 tablet by mouth 2 (two) times daily.    Marland Kitchen tretinoin (RETIN-A) 0.1 % cream Apply 1 application topically See admin instructions. Apply to face nightly at bedtime    . furosemide (LASIX) 20 MG tablet Take 1 tablet (20 mg total) by mouth daily. 90 tablet 3  . metoprolol tartrate (LOPRESSOR) 50 MG tablet Take 1 tablet (50 mg total) by mouth 2 (two) times daily. 180 tablet 3   No current facility-administered medications for this visit.     Allergies:   Hydrochlorothiazide, Hydrocodone-acetaminophen, and Other    Social History:  The patient  reports that she has never smoked. She has never used smokeless tobacco. She reports that she does not drink alcohol or use drugs.   Family History:  The patient's family history includes Diabetes in her brother and maternal aunt; Berenice Primas' disease in her sister; Hypothyroidism in her mother.    ROS:  Please see the history of present illness. Otherwise, review of systems are positive for none.   All other systems are reviewed  and negative.    PHYSICAL EXAM: VS:  BP 120/68   Pulse 66   Ht 5\' 1"  (1.549 m)   Wt 207 lb 12.8 oz (94.3 kg)   SpO2 98%   BMI 39.26 kg/m  , BMI Body mass index is 39.26 kg/m.   General: Well developed, well nourished, NAD Lungs:Clear to ausculation bilaterally. No wheezes, rales, or rhonchi. Breathing is unlabored. Cardiovascular: RRR with S1 S2. No murmurs Extremities: Mild 1+ edema. No clubbing or cyanosis. Neuro: Alert and oriented. No focal deficits. No facial asymmetry. MAE spontaneously. Psych: Responds to questions appropriately with normal affect.     EKG:  EKG is not ordered today.  Recent Labs: 05/30/2019: B Natriuretic Peptide 243.5; TSH <0.010 06/08/2019: ALT 15; BUN 24; Creatinine, Ser 1.41; Hemoglobin 14.5; Magnesium 2.2; Platelets 371.0;  Potassium 4.8; Sodium 137    Lipid Panel    Component Value Date/Time   CHOL 142 05/30/2019 1852   TRIG 42 05/30/2019 1852   HDL 55 05/30/2019 1852   CHOLHDL 2.6 05/30/2019 1852   VLDL 8 05/30/2019 1852   LDLCALC 79 05/30/2019 1852     Wt Readings from Last 3 Encounters:  06/12/19 207 lb 12.8 oz (94.3 kg)  06/08/19 209 lb 6.4 oz (95 kg)  06/04/19 209 lb 14.4 oz (95.2 kg)     Other studies Reviewed: Additional studies/ records that were reviewed today include:   Limited echocardiogram 06/04/2019:  1. The left ventricle has mildly reduced systolic function, with an ejection fraction of 45-50%. The cavity size was normal. Left ventricular diffuse hypokinesis.  2. The right ventricle has normal systolc function. The cavity was normal. There is no increase in right ventricular wall thickness. Right ventricular systolic pressure is normal with an estimated pressure of 30.2 mmHg.  3. The aortic valve is tricuspid Mild sclerosis of the aortic valve. Aortic valve regurgitation is trivial by color flow Doppler.  FINDINGS  Left Ventricle: The left ventricle has mildly reduced systolic function, with an ejection fraction of  45-50%. The cavity size was normal. There is no increase in left ventricular wall thickness. Left ventricular diffuse hypokinesis. Definity contrast  agent was given IV to delineate the left ventricular endocardial borders.   2D Echo 05/31/2019 IMPRESSIONS   1. The left ventricle has moderate-severely reduced systolic function, with an ejection fraction of 30-35%. The cavity size was normal. There is moderately increased left ventricular wall thickness. Left ventricular diastolic function could not be  evaluated secondary to atrial fibrillation. Left ventricular diffuse hypokinesis. 2. Severe akinesis of the left ventricular, entire anterior wall, anteroseptal wall and apical segment. 3. The right ventricle has normal systolic function. The cavity was normal. There is no increase in right ventricular wall thickness. 4. The mitral valve is abnormal. Mild thickening of the mitral valve leaflet. 5. The aortic valve is tricuspid. Mild sclerosis of the aortic valve. No stenosis of the aortic valve. 6. The aorta is abnormal unless otherwise noted. 7. There is mild dilatation of the ascending aorta measuring 38 mm. 8. The inferior vena cava was normal in size with <50% respiratory variability. 9. The tricuspid valve is grossly normal.  [facility] 05/31/19 Conclusion    There is mild left ventricular systolic dysfunction.  LV end diastolic pressure is normal.  The left ventricular ejection fraction is 50-55% by visual estimate.  1. Normal coronary anatomy 2. Low normal to mildly reduced LV function. Assessment limited by Afib. 3. Normal right heart pressures  4. LV filling pressure are upper normal 5. Cardiac index 2.37.  Plan: medical management. Would avoid using right radial access in the future.   RHC Data Hemo Data   Most Recent Value  Fick Cardiac Output 4.63 L/min  Fick Cardiac Output Index 2.37 (L/min)/BSA  RA A Wave 10 mmHg  RA V Wave 10 mmHg  RA Mean 8  mmHg  RV Systolic Pressure 36 mmHg  RV Diastolic Pressure 1 mmHg  RV EDP 10 mmHg  PA Systolic Pressure 38 mmHg  PA Diastolic Pressure 3 mmHg  PA Mean 22 mmHg  PW A Wave 22 mmHg  PW V Wave 21 mmHg  PW Mean 19 mmHg  AO Systolic Pressure [id number] mmHg  AO Diastolic Pressure 67 mmHg  AO Mean 85 mmHg  LV Systolic Pressure 123XX123 mmHg  LV Diastolic Pressure 0 mmHg  LV EDP 16 mmHg  AOp Systolic Pressure [id number] mmHg  AOp Diastolic Pressure 62 mmHg  AOp Mean Pressure 84 mmHg  LVp Systolic Pressure 123456 mmHg  LVp Diastolic Pressure 3 mmHg  LVp EDP Pressure 9 mmHg  QP/QS 1  TPVR Index 9.28 HRUI  TSVR Index 35.84 HRUI  PVR SVR Ratio 0.04  TPVR/TSVR Ratio 0.26    ASSESSMENT AND PLAN:  1. AcuteSystolicCHF/ NICM: -Underwent cardiac cath 05/31/2019 with normal coronaries however reduced LV function on echocardiogram.  Repeat echocardiogram 06/04/2019 with low normal LVEF at 45 to 50%. -Continue losartan 25, Lasix 40 -Creatinine from 06/08/2019 elevated from 1.14 to 1.41>> therefore decreased Lasix to 20mg  QD with repeat lab work 06/16/2019 -Continue to hold losartan for now a -I suspect she was too dry with 40 mg Lasix daily -Euvolemic on exam today -We will follow closely in 1 to 2 months  2.  New onset Atrial Fibrillation likely secondary to Graves' disease:   -HR stable today at 66bpm>>NSR  -Decrease Lopressor to 50 mg twice daily -Continue Eliquis 5 mg twice daily -CHA2DS2 VASc scoreis at least 3 for CHF, Age 16-74 and female sex. -Follows with endocrinology for Graves' disease.  They report likely AF in the setting of thyroid issues.  She is to follow-up with him in 2 weeks and with [place] in 1 month  3. Elevated Hs Troponin:  -No anginal symptoms, LHC 9/9 showed no CAD and normal coronaries.  Troponin elevation likely secondary to demand ischemia from acute CHF and rapid A. fib.    4.Hyperthyroidism>>Graves Disease:  -TSH extremely low at <0.010. F/u FreeT3 elevated at 5.5 and Free -Seen  by endocrinology 06/08/2019 with recommendations for antithyroid drugs and radioactive iodine as mainstay treatment for autoimmune hyperthyroidism, Graves' disease  5.Renal Insuffiency: -Creatinine on 06/04/2019, 1.14 -Elevated on 06/08/2019 at 1.41, likely in the setting of over diuresis -We will decrease Lasix to 20 mg daily -Losartan discontinued per endocrinology -Repeat lab work on 06/16/2019   6. Hypokalemia:  -Resolved per labs on 06/08/2019, K+ 4.8 -If remains stable on lab work on 06/16/2019 we will continue to hold K. Dur  Current medicines are reviewed at length with the patient today.  The patient does not have concerns regarding medicines.  The following changes have been made: Decrease Lopressor to 50 mg twice daily, decrease Lasix to 20 mg daily, continue to hold potassium supplement  Labs/ tests ordered today include: BMET on 06/16/2019 No orders of the defined types were placed in this encounter.   Disposition:   FU with APP in 1 month  Signed, Kathyrn Drown, NP  06/12/2019 2:19 PM    [facility] [place], [place], [place]  [address] Phone: [phone]; Fax: [phone]

## 2019-06-09 NOTE — Telephone Encounter (Signed)
Notes recorded by Frederik Schmidt, RN on 06/09/2019 at 4:37 PM EDT  The patient has been notified of the result and verbalized understanding. All questions (if any) were answered.  Frederik Schmidt, RN 06/09/2019 4:37 PM

## 2019-06-09 NOTE — Telephone Encounter (Signed)
New message    Patient returning your call please call back.

## 2019-06-09 NOTE — Telephone Encounter (Signed)
-----   Message from Sueanne Margarita, MD sent at 06/09/2019  3:05 PM EDT ----- Decrease lasix to 40mg  daily and Kdur to 43meq daily and repeat BMET in 1 week.  Stop digoxin

## 2019-06-09 NOTE — Telephone Encounter (Signed)
Encounter no needed

## 2019-06-09 NOTE — Telephone Encounter (Signed)
-----   Message from Sueanne Margarita, MD sent at 06/09/2019  3:05 PM EDT ----- Decrease lasix to 40mg  daily and Kdur to 77meq daily and repeat BMET in 1 week.  Stop digoxin

## 2019-06-12 ENCOUNTER — Encounter: Payer: Self-pay | Admitting: Cardiology

## 2019-06-12 ENCOUNTER — Ambulatory Visit (INDEPENDENT_AMBULATORY_CARE_PROVIDER_SITE_OTHER): Payer: Medicare Other | Admitting: Cardiology

## 2019-06-12 ENCOUNTER — Other Ambulatory Visit: Payer: Self-pay

## 2019-06-12 VITALS — BP 120/68 | HR 66 | Ht 61.0 in | Wt 207.8 lb

## 2019-06-12 DIAGNOSIS — I4891 Unspecified atrial fibrillation: Secondary | ICD-10-CM | POA: Diagnosis not present

## 2019-06-12 DIAGNOSIS — I428 Other cardiomyopathies: Secondary | ICD-10-CM

## 2019-06-12 DIAGNOSIS — N289 Disorder of kidney and ureter, unspecified: Secondary | ICD-10-CM

## 2019-06-12 MED ORDER — FUROSEMIDE 20 MG PO TABS: 20.0000 mg | ORAL_TABLET | Freq: Every day | ORAL | 3 refills | Status: DC

## 2019-06-12 MED ORDER — METOPROLOL TARTRATE 50 MG PO TABS: 50.0000 mg | ORAL_TABLET | Freq: Two times a day (BID) | ORAL | 3 refills | Status: DC

## 2019-06-12 NOTE — Patient Instructions (Addendum)
Medication Instructions:  Your physician has recommended you make the following change in your medication: 1. STOP LOSARTAN   2. DECREASE METOPROLOL TO 50 MG TWICE DAILY  3. DECREASE LASIX TO 20 MG DAILY.  If you need a refill on your cardiac medications before your next appointment, please call your pharmacy.   Lab work: NONE  Testing/Procedures: NONE  Follow-Up: At [facility], you and your health needs are our priority.  As part of our continuing mission to provide you with exceptional heart care, we have created designated Provider Care Teams.  These Care Teams include your primary Cardiologist (physician) and Advanced Practice Providers (APPs -  Physician Assistants and Nurse Practitioners) who all work together to provide you with the care you need, when you need it. You will need a follow up appointment in 1-2 months.    You may see DR. TURNER or one of the following Advanced Practice Providers on your designated Care Team:   [place], PA-C Melina Copa, PA-C . Ermalinda Barrios, PA-C

## 2019-06-16 ENCOUNTER — Other Ambulatory Visit: Payer: Self-pay

## 2019-06-16 ENCOUNTER — Other Ambulatory Visit: Payer: Medicare Other | Admitting: *Deleted

## 2019-06-16 DIAGNOSIS — Z79899 Other long term (current) drug therapy: Secondary | ICD-10-CM | POA: Diagnosis not present

## 2019-06-16 DIAGNOSIS — N289 Disorder of kidney and ureter, unspecified: Secondary | ICD-10-CM | POA: Diagnosis not present

## 2019-06-17 LAB — BASIC METABOLIC PANEL
BUN/Creatinine Ratio: 13 (ref 12–28)
BUN: 15 mg/dL (ref 8–27)
CO2: 27 mmol/L (ref 20–29)
Calcium: 9.7 mg/dL (ref 8.7–10.3)
Chloride: 97 mmol/L (ref 96–106)
Creatinine, Ser: 1.19 mg/dL — ABNORMAL HIGH (ref 0.57–1.00)
GFR calc Af Amer: 55 mL/min/{1.73_m2} — ABNORMAL LOW (ref >59)
GFR calc non Af Amer: 48 mL/min/{1.73_m2} — ABNORMAL LOW (ref >59)
Glucose: 90 mg/dL (ref 65–99)
Potassium: 3.8 mmol/L (ref 3.5–5.2)
Sodium: 139 mmol/L (ref 134–144)

## 2019-06-21 ENCOUNTER — Telehealth: Payer: Self-pay | Admitting: Cardiology

## 2019-06-21 NOTE — Telephone Encounter (Signed)
The patient has been notified of the result and verbalized understanding.  All questions (if any) were answered. Wilma Flavin, RN 06/21/2019 9:33 AM

## 2019-06-21 NOTE — Telephone Encounter (Signed)
New Message:   Patient called to ask about her lab results. She was told that based on her labs, her medication may need adjusted. Please advise

## 2019-06-26 ENCOUNTER — Other Ambulatory Visit: Payer: Self-pay

## 2019-06-27 NOTE — Progress Notes (Signed)
Patient ID: Christina [name], female   DOB: 1951-03-08, 68 y.o.   MRN: [id number]                                                                                                               Reason for Appointment:  Hyperthyroidism, follow-up visit   Chief complaint: Weakness   History of Present Illness:   Prior history: For the last several years the patient has had symptoms of occasional palpitations, shortness of breath on exertion, shakiness and feeling somewhat warm.  However she did not have any recent worsening of the symptoms except on the morning of hospitalization She previously did not have any unusual weight loss, anxiety or fatigue She was also having some swelling of her legs prior to admission Since she was having difficulties with tachycardia and atrial fibrillation her thyroid level was checked in the hospital and indicated hyperthyroidism  RECENT HISTORY: Her free T4 on her initial consultation was about the same as in the hospital She has no side effects from methimazole such as nausea, hives or joint pains She has been on methimazole 5 mg twice daily as recommended Also on 50 mg of metoprolol twice daily, previously on 75 mg  Recently she has had less feeling of weakness and lightheadedness Does not have palpitations but she does get tired easily  She is concerned that her pulse rate has been relatively slow at home No shakiness Still complaining of excessive cold intolerance  Labs are pending  Wt Readings from Last 3 Encounters:  06/28/19 208 lb 9.6 oz (94.6 kg)  06/12/19 207 lb 12.8 oz (94.3 kg)  06/08/19 209 lb 6.4 oz (95 kg)     Thyroid function tests as follows:     Lab Results  Component Value Date   FREET4 1.81 (H) 06/08/2019   FREET4 1.86 (H) 05/31/2019   T3FREE 3.5 06/08/2019   T3FREE 5.5 (H) 05/31/2019   TSH <0.010 (L) 05/30/2019    Lab Results  Component Value Date   THYROTRECAB 3.92 (H) 06/01/2019     Allergies as of 06/28/2019     Reactions   Hydrochlorothiazide Other (See Comments), Hypertension   Wildly fluctuates B/P low-to-high, then high-to-low, in a matter of minutes   Hydrocodone-acetaminophen Nausea And Vomiting   Other Nausea And Vomiting, Other (See Comments)   Stronger pain meds (post-op) caused dehydration and constipation      Medication List       Accurate as of June 28, 2019  3:52 PM. If you have any questions, ask your nurse or doctor.        STOP taking these medications   potassium chloride SA 20 MEQ tablet Commonly known as: KLOR-CON Stopped by: Elayne Snare, MD     TAKE these medications   Aczone 5 % topical gel Generic drug: Dapsone Apply 1 application topically See admin instructions. Apply to the face daily as directed   amoxicillin 500 MG capsule Commonly known as: AMOXIL Take 2,000 mg by mouth See admin instructions. Take 2,000 mg  by mouth one hour prior to dental appointments   apixaban 5 MG Tabs tablet Commonly known as: ELIQUIS Take 1 tablet (5 mg total) by mouth 2 (two) times daily.   furosemide 20 MG tablet Commonly known as: LASIX Take 1 tablet (20 mg total) by mouth daily.   Linseed Oil Oil Take 1 capsule by mouth daily.   loratadine 10 MG tablet Commonly known as: CLARITIN Take 10 mg by mouth daily.   methimazole 5 MG tablet Commonly known as: TAPAZOLE Take 1 tablet (5 mg total) by mouth 2 (two) times daily.   metoprolol tartrate 50 MG tablet Commonly known as: LOPRESSOR Take 1 tablet (50 mg total) by mouth 2 (two) times daily.   sulfamethoxazole-trimethoprim 800-160 MG per tablet Commonly known as: BACTRIM DS Take 1 tablet by mouth 2 (two) times daily.   tretinoin 0.1 % cream Commonly known as: RETIN-A Apply 1 application topically See admin instructions. Apply to face nightly at bedtime   Vitamin D3 25 MCG (1000 UT) Caps Take 1,000 Units by mouth daily.           Past Medical History:  Diagnosis Date  . Arthritis    PAIN AND OA RIGHT  KNEE; S/P LEFT TOTAL KNEE REPLACEMENT 05-07-14 - STILL IN PHYSICAL THERAPY  . Asthma    hx of - PT STATES MILD - NO LONGER REQUIRES INHALERS  . Colitis    mild   . GERD (gastroesophageal reflux disease)   . H/O hiatal hernia   . Hypertension    hx of 10 years ago ; PT'S BLOOD PRESSURE RECENTLY ELEVATED WHILE IN HOSP FOR SURG- NOT ON ANY B/P MEDS  . Thyroid disease     Past Surgical History:  Procedure Laterality Date  . KNEE CLOSED REDUCTION Left 06/05/2014   Procedure: CLOSED MANIPULATION KNEE;  Surgeon: Mauri Pole, MD;  Location: [facility];  Service: Orthopedics;  Laterality: Left;  . RIGHT/LEFT HEART CATH AND CORONARY ANGIOGRAPHY N/A 05/31/2019   Procedure: RIGHT/LEFT HEART CATH AND CORONARY ANGIOGRAPHY;  Surgeon: Martinique, Peter M, MD;  Location: [place] [facility];  Service: Cardiovascular;  Laterality: N/A;  . TOTAL KNEE ARTHROPLASTY Left 05/07/2014   Procedure: LEFT TOTAL KNEE ARTHROPLASTY;  Surgeon: Mauri Pole, MD;  Location: [facility];  Service: Orthopedics;  Laterality: Left;  . TOTAL KNEE ARTHROPLASTY Right 06/05/2014   Procedure: RIGHT TOTAL KNEE ARTHROPLASTY;  Surgeon: Mauri Pole, MD;  Location: [facility];  Service: Orthopedics;  Laterality: Right;  . WISDOM TEETH EXTRACTIONS      Family History  Problem Relation Age of Onset  . Hypothyroidism Mother   . Graves' disease Sister   . Diabetes Brother   . Diabetes Maternal Aunt     Social History:  reports that she has never smoked. She has never used smokeless tobacco. She reports that she does not drink alcohol or use drugs.  Allergies:  Allergies  Allergen Reactions  . Hydrochlorothiazide Other (See Comments) and Hypertension    Wildly fluctuates B/P low-to-high, then high-to-low, in a matter of minutes  . Hydrocodone-Acetaminophen Nausea And Vomiting  . Other Nausea And Vomiting and Other (See Comments)    Stronger pain meds (post-op) caused dehydration and constipation     Review of Systems   She has been on  Lasix 20 mg daily and potassium supplements  Lab Results  Component Value Date   K 3.8 06/16/2019   No significant history of hypertension in the past and is only on metoprolol with  history of CHF  Home blood pressure readings systolic 99991111  BP Readings from Last 3 Encounters:  06/28/19 140/70  06/12/19 120/68  06/08/19 110/60       Examination:   BP 140/70 (BP Location: Left Arm, Patient Position: Sitting, Cuff Size: Large)   Pulse (!) 48   Ht 5\' 1"  (1.549 m)   Wt 208 lb 9.6 oz (94.6 kg)   SpO2 97%   BMI 39.41 kg/m   No prominence of the eyes Thyroid is not palpable Heart rate is 64 regular  Neurological:  No fine tremors are present. Deep tendon reflexes at biceps are slightly brisk No ankle edema    Assessment/Plan:   Hyperthyroidism, from Graves' disease with baseline thyrotropin receptor antibody of 3.9  She does have some improvement in her symptoms with continuing methimazole that was started about 4 weeks ago  Difficult to assess her symptoms since she has somewhat nonspecific symptoms partly related to her cardiac function  However her pulse appears to be relatively slow with current dose of 50 mg metoprolol twice daily  Again no goiter on exam.  We will have her recheck thyroid levels today and decide on dose of methimazole Since she has mild hyperthyroidism without a goiter may be able to achieve remission with methimazole for the next few months  She will discuss the dose of metoprolol with her cardiologist  Elayne Snare 06/28/2019, 3:52 PM    Note: This office note was prepared with Dragon voice recognition system technology. Any transcriptional errors that result from this process are unintentional.

## 2019-06-28 ENCOUNTER — Encounter: Payer: Self-pay | Admitting: Endocrinology

## 2019-06-28 ENCOUNTER — Other Ambulatory Visit: Payer: Self-pay

## 2019-06-28 ENCOUNTER — Ambulatory Visit (INDEPENDENT_AMBULATORY_CARE_PROVIDER_SITE_OTHER): Payer: Medicare Other | Admitting: Endocrinology

## 2019-06-28 VITALS — BP 140/70 | HR 48 | Ht 61.0 in | Wt 208.6 lb

## 2019-06-28 DIAGNOSIS — E0590 Thyrotoxicosis, unspecified without thyrotoxic crisis or storm: Secondary | ICD-10-CM

## 2019-06-29 ENCOUNTER — Other Ambulatory Visit: Payer: Self-pay

## 2019-06-29 ENCOUNTER — Telehealth: Payer: Self-pay | Admitting: Cardiology

## 2019-06-29 ENCOUNTER — Telehealth: Payer: Self-pay | Admitting: Endocrinology

## 2019-06-29 LAB — TSH: TSH: <0.01 u[IU]/mL — ABNORMAL LOW (ref 0.35–4.50)

## 2019-06-29 LAB — T4, FREE: Free T4: 1 ng/dL (ref 0.60–1.60)

## 2019-06-29 LAB — T3, FREE: T3, Free: 2.9 pg/mL (ref 2.3–4.2)

## 2019-06-29 MED ORDER — METHIMAZOLE 5 MG PO TABS: ORAL_TABLET | ORAL | 2 refills | Status: DC

## 2019-06-29 NOTE — Progress Notes (Signed)
Please call to let patient know that the thyroid levels are in the lower end of the normal range.  Instead of taking methimazole 5 mg twice daily change this to 1-1/2 tablets in the morning daily, keep appointment as scheduled.  Please send new prescription

## 2019-06-29 NOTE — Telephone Encounter (Signed)
Decrease Lopressor to 25mg  BID and set up with afib clinic.  Have her check BP and HR daily for a week and call with results

## 2019-06-29 NOTE — Telephone Encounter (Signed)
New Message   STAT if HR is under 50 or over 120 (normal HR is 60-100 beats per minute)  1) What is your heart rate? 58  2) Do you have a log of your heart rate readings (document readings)? 48, 60, 62, 48, 58  3) Do you have any other symptoms? Feeling weak and fatigue

## 2019-06-29 NOTE — Telephone Encounter (Signed)
New Message   *STAT* If patient is at the pharmacy, call can be transferred to refill team.   1. Which medications need to be refilled? (please list name of each medication and dose if known) apixaban (ELIQUIS) 5 MG TABS tablet  2. Which pharmacy/location (including street and city if local pharmacy) is medication to be sent to? [place], [place]  3. Do they need a 30 day or 90 day supply? 30 day

## 2019-06-29 NOTE — Telephone Encounter (Signed)
Rx sent 

## 2019-06-29 NOTE — Telephone Encounter (Signed)
MEDICATION: Methimazole 5 MG (1 and 1/2 pills per day)  PHARMACY:  [facility]  IS THIS A 90 DAY SUPPLY :   IS PATIENT OUT OF MEDICATION: 1-2 days  IF NOT; HOW MUCH IS LEFT:   LAST APPOINTMENT DATE: @10 /03/2019  NEXT APPOINTMENT DATE:@11 /23/2020  DO WE HAVE YOUR PERMISSION TO LEAVE A DETAILED MESSAGE: YES - [phone]  OTHER COMMENTS:    **Let patient know to contact pharmacy at the end of the day to make sure medication is ready. **  ** Please notify patient to allow 48-72 hours to process**  **Encourage patient to contact the pharmacy for refills or they can request refills through [facility]**

## 2019-06-29 NOTE — Telephone Encounter (Signed)
I spoke with pt. She reports heart rates as listed below.  Currently her heart rate is 58.  She has been feeling weak the last several days.  Not as bad as the fatigue she had when she saw Kathyrn Drown, NP recently.  Saw Dr. Dwyane Ying recently and thyroid has improved. Pt is asking if medications need to be lowered due to heart rate now that thyroid studies have improved. She also states that it was mentioned she may be able to discontinue Eliquis if thyroid normalized.  She is currently taking Eliquis and is  asking if she should continue.Marland Kitchen She will need a new prescription sent in to the pharmacy before the weekend if she is to continue.

## 2019-06-30 MED ORDER — METOPROLOL TARTRATE 25 MG PO TABS: 25.0000 mg | ORAL_TABLET | Freq: Two times a day (BID) | ORAL | 3 refills | Status: DC

## 2019-06-30 MED ORDER — APIXABAN 5 MG PO TABS: 5.0000 mg | ORAL_TABLET | Freq: Two times a day (BID) | ORAL | 6 refills | Status: DC

## 2019-06-30 NOTE — Telephone Encounter (Signed)
I spoke with pt and gave her information from Dr Radford Pax. She would like to wait on afib clinic appointment for now and follow up with Dr Radford Pax as planned on 11/3.  She is aware to continue Eliquis and new prescription has been sent to her pharmacy.

## 2019-06-30 NOTE — Telephone Encounter (Signed)
Pt last saw Kathyrn Drown, NP on 06/12/19, last labs 06/16/19 Creat 1.19, age 68, weight 94.6kg, based on specified criteria pt is on appropriate dosage of Eliquis 5mg  BID.  Will refill rx.

## 2019-07-24 NOTE — Progress Notes (Signed)
Cardiology Office Note:    Date:  07/25/2019   ID:  Christina [name], Christina [name] Feb 04, 1951, MRN [id number]  PCP:  Mateo Flow, MD  Cardiologist:  Minus Breeding, MD    Referring MD: Mateo Flow, MD   Chief Complaint  Patient presents with  . Congestive Heart Failure  . Atrial Fibrillation  . Hypertension    History of Present Illness:    Christina [name] is a 68 y.o. female with a hx of chronic systolic CHF, normal coronary arteries, PAF, hyperthyroidism, asthma, GERD and HTN. She recently called stating that she was feeling weak for several days and HR was 58bpm. Her Lopressor was decreased to 25mg  BID and she is now here for followup.  She denies any chest pain or pressure, SOB, DOE, PND, orthopnea, LE edema, dizziness, palpitations or syncope. She is compliant with her meds and is tolerating meds with no SE.    Past Medical History:  Diagnosis Date  . 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 Asthma    hx of - PT STATES MILD - NO LONGER REQUIRES INHALERS  . Colitis    mild   . GERD (gastroesophageal reflux disease)   . H/O hiatal hernia   . Hypertension    hx of 10 years ago ; PT'S BLOOD PRESSURE RECENTLY ELEVATED WHILE IN HOSP FOR SURG- NOT ON ANY B/P MEDS  . Thyroid disease     Past Surgical History:  Procedure Laterality Date  . KNEE CLOSED REDUCTION Left 06/05/2014   Procedure: CLOSED MANIPULATION KNEE;  Surgeon: Mauri Pole, MD;  Location: [facility];  Service: Orthopedics;  Laterality: Left;  . RIGHT/LEFT HEART CATH AND CORONARY ANGIOGRAPHY N/A 05/31/2019   Procedure: RIGHT/LEFT HEART CATH AND CORONARY ANGIOGRAPHY;  Surgeon: Martinique, Peter M, MD;  Location: [facility];  Service: Cardiovascular;  Laterality: N/A;  . TOTAL KNEE ARTHROPLASTY Left 05/07/2014   Procedure: LEFT TOTAL KNEE ARTHROPLASTY;  Surgeon: Mauri Pole, MD;  Location: [facility];  Service: Orthopedics;  Laterality: Left;  . TOTAL KNEE  ARTHROPLASTY Right 06/05/2014   Procedure: RIGHT TOTAL KNEE ARTHROPLASTY;  Surgeon: Mauri Pole, MD;  Location: [facility];  Service: Orthopedics;  Laterality: Right;  . WISDOM TEETH EXTRACTIONS      Current Medications: Current Meds  Medication Sig  . adapalene (DIFFERIN) 0.1 % gel Apply topically at bedtime.  Marland Kitchen amoxicillin (AMOXIL) 500 MG capsule Take 2,000 mg by mouth See admin instructions. Take 2,000 mg by mouth one hour prior to dental appointments  . apixaban (ELIQUIS) 5 MG TABS tablet Take 1 tablet (5 mg total) by mouth 2 (two) times daily.  . Cholecalciferol (VITAMIN D3) 25 MCG (1000 UT) CAPS Take 1,000 Units by mouth daily.   . Dapsone (ACZONE) 5 % topical gel Apply 1 application topically See admin instructions. Apply to the face daily as directed  . Flaxseed Oil (LINSEED OIL) OIL Take 1 capsule by mouth daily.   . Flaxseed, Linseed, (FLAX SEEDS PO) Take by mouth as directed.  . furosemide (LASIX) 20 MG tablet Take 1 tablet (20 mg total) by mouth daily.  Marland Kitchen loratadine (CLARITIN) 10 MG tablet Take 10 mg by mouth daily.  . methimazole (TAPAZOLE) 5 MG tablet Take 1 and a half tablets by mouth once daily.  . metoprolol tartrate (LOPRESSOR) 25 MG tablet Take 1 tablet (25 mg total) by mouth 2 (two) times daily.  Marland Kitchen  sulfamethoxazole-trimethoprim (BACTRIM DS) 800-160 MG per tablet Take 1 tablet by mouth 2 (two) times daily.  Marland Kitchen tretinoin (RETIN-A) 0.1 % cream Apply 1 application topically See admin instructions. Apply to face nightly at bedtime     Allergies:   Hydrochlorothiazide, Hydrocodone-acetaminophen, and Other   Social History   Socioeconomic History  . Marital status: Single    Spouse name: Not on file  . Number of children: Not on file  . Years of education: Not on file  . Highest education level: Not on file  Occupational History  . Not on file  Social Needs  . Financial resource strain: Not on file  . Food insecurity    Worry: Not on file    Inability: Not on file  .  Transportation needs    Medical: Not on file    Non-medical: Not on file  Tobacco Use  . Smoking status: Never Smoker  . Smokeless tobacco: Never Used  Substance and Sexual Activity  . Alcohol use: No  . Drug use: No  . Sexual activity: Not on file  Lifestyle  . Physical activity    Days per week: Not on file    Minutes per session: Not on file  . Stress: Not on file  Relationships  . Social Herbalist on phone: Not on file    Gets together: Not on file    Attends religious service: Not on file    Active member of club or organization: Not on file    Attends meetings of clubs or organizations: Not on file    Relationship status: Not on file  Other Topics Concern  . Not on file  Social History Narrative  . Not on file     Family History: The patient's family history includes Diabetes in her brother and maternal aunt; Berenice Primas' disease in her sister; Hypothyroidism in her mother.  ROS:   Please see the history of present illness.    ROS  All other systems reviewed and negative.   EKGs/Labs/Other Studies Reviewed:    The following studies were reviewed today: none  EKG:  EKG is  ordered today.  The ekg ordered today demonstrates NSR at 61bpm with no ST changes  Recent Labs: 05/30/2019: B Natriuretic Peptide 243.5 06/08/2019: ALT 15; Hemoglobin 14.5; Magnesium 2.2; Platelets 371.0 06/16/2019: BUN 15; Creatinine, Ser 1.19; Potassium 3.8; Sodium 139 06/28/2019: TSH <0.01   Recent Lipid Panel    Component Value Date/Time   CHOL 142 05/30/2019 1852   TRIG 42 05/30/2019 1852   HDL 55 05/30/2019 1852   CHOLHDL 2.6 05/30/2019 1852   VLDL 8 05/30/2019 1852   LDLCALC 79 05/30/2019 1852    Physical Exam:    VS:  BP (!) 142/80   Pulse 61   Ht 5\' 1"  (1.549 m)   Wt 209 lb (94.8 kg)   BMI 39.49 kg/m     Wt Readings from Last 3 Encounters:  07/25/19 209 lb (94.8 kg)  06/28/19 208 lb 9.6 oz (94.6 kg)  06/12/19 207 lb 12.8 oz (94.3 kg)     GEN:  Well  nourished, well developed in no acute distress HEENT: Normal NECK: No JVD; No carotid bruits LYMPHATICS: No lymphadenopathy CARDIAC: RRR, no murmurs, rubs, gallops RESPIRATORY:  Clear to auscultation without rales, wheezing or rhonchi  ABDOMEN: Soft, non-tender, non-distended MUSCULOSKELETAL:  No edema; No deformity  SKIN: Warm and dry NEUROLOGIC:  Alert and oriented x 3 PSYCHIATRIC:  Normal affect   ASSESSMENT:  1. Chronic systolic heart failure ([place])   2. PAF (paroxysmal atrial fibrillation) ([place])   3. Essential hypertension    PLAN:    In order of problems listed above:  1. Chronic systolic CHF/NICM -Underwent cardiac cath 05/31/2019 with normal coronaries however reduced LV function on echocardiogram.   -Repeat echocardiogram 06/04/2019 with low normal LVEF at 45 to 50%. -doing well with no SOB or LE edema -continue Lasix 20mg  daily -creatinine 1.19 and K+ 3.8 in Sept   2.  PAF -occurred in setting of Grave's dz -maintaining NSR -no bleeding problems on DOAC -continue Lopressor 50mg  BID and Eliquis 5mg  BID -Hbg 14 -she really wants to get off Eliquis due to severe arthritis and need to take NSAIDs.  I discussed this with EP.  I am concerned that she has a CHADS2VASC score of 4 and is still at risk of cardioembolic events.  Her afib did occur in the setting of hyperthyroidism but she is only been Euthyroid a short time.   -recommend she stay on Eliquis for now and then I will see her back in 6 months and likely get an event monitor to determine if she is having any silent afib.  3.  HTN -BP controlled -continue Lopressor 25mg  BID   Medication Adjustments/Labs and Tests Ordered: Current medicines are reviewed at length with the patient today.  Concerns regarding medicines are outlined above.  Orders Placed This Encounter  Procedures  . EKG 12-Lead   No orders of the defined types were placed in this encounter.   Signed, Fransico Him, MD  07/25/2019 3:22 PM     [place]

## 2019-07-25 ENCOUNTER — Other Ambulatory Visit: Payer: Self-pay

## 2019-07-25 ENCOUNTER — Ambulatory Visit (INDEPENDENT_AMBULATORY_CARE_PROVIDER_SITE_OTHER): Payer: Medicare Other | Admitting: Cardiology

## 2019-07-25 VITALS — BP 142/80 | HR 61 | Ht 61.0 in | Wt 209.0 lb

## 2019-07-25 DIAGNOSIS — I10 Essential (primary) hypertension: Secondary | ICD-10-CM | POA: Diagnosis not present

## 2019-07-25 DIAGNOSIS — I5022 Chronic systolic (congestive) heart failure: Secondary | ICD-10-CM | POA: Diagnosis not present

## 2019-07-25 DIAGNOSIS — I480 Paroxysmal atrial fibrillation: Secondary | ICD-10-CM

## 2019-07-25 NOTE — Patient Instructions (Signed)
Medication Instructions:  Your physician recommends that you continue on your current medications as directed. Please refer to the Current Medication list given to you today.  *If you need a refill on your cardiac medications before your next appointment, please call your pharmacy*   Follow-Up: At [facility], you and your health needs are our priority.  As part of our continuing mission to provide you with exceptional heart care, we have created designated Provider Care Teams.  These Care Teams include your primary Cardiologist (physician) and Advanced Practice Providers (APPs -  Physician Assistants and Nurse Practitioners) who all work together to provide you with the care you need, when you need it.  Your next appointment:   6 month(s)  The format for your next appointment:   Either In Person or Virtual  Provider:   Traci Turner, MD   

## 2019-08-10 ENCOUNTER — Other Ambulatory Visit: Payer: Self-pay

## 2019-08-10 DIAGNOSIS — E0500 Thyrotoxicosis with diffuse goiter without thyrotoxic crisis or storm: Secondary | ICD-10-CM | POA: Diagnosis not present

## 2019-08-10 DIAGNOSIS — H40051 Ocular hypertension, right eye: Secondary | ICD-10-CM | POA: Diagnosis not present

## 2019-08-10 DIAGNOSIS — H532 Diplopia: Secondary | ICD-10-CM | POA: Diagnosis not present

## 2019-08-10 DIAGNOSIS — H524 Presbyopia: Secondary | ICD-10-CM | POA: Diagnosis not present

## 2019-08-14 ENCOUNTER — Encounter: Payer: Self-pay | Admitting: Endocrinology

## 2019-08-14 ENCOUNTER — Ambulatory Visit (INDEPENDENT_AMBULATORY_CARE_PROVIDER_SITE_OTHER): Payer: Medicare Other | Admitting: Endocrinology

## 2019-08-14 VITALS — BP 136/82 | HR 61 | Ht 61.0 in | Wt 207.4 lb

## 2019-08-14 DIAGNOSIS — E0590 Thyrotoxicosis, unspecified without thyrotoxic crisis or storm: Secondary | ICD-10-CM

## 2019-08-14 NOTE — Progress Notes (Addendum)
Patient ID: Christina [name], female   DOB: [birth year], 68 y.o.   MRN: [id number]                                                                                                               Reason for Appointment:  Hyperthyroidism, follow-up visit   Chief complaint: Weakness   History of Present Illness:   Prior history: For the last several years the patient has had symptoms of occasional palpitations, shortness of breath on exertion, shakiness and feeling somewhat warm.  However she did not have any recent worsening of the symptoms except on the morning of hospitalization She previously did not have any unusual weight loss, anxiety or fatigue She was also having some swelling of her legs prior to admission Since she was having difficulties with tachycardia and atrial fibrillation her thyroid level was checked in the hospital and indicated hyperthyroidism  RECENT HISTORY:  She has Graves' disease confirmed with a baseline thyrotropin receptor antibody level of 3.9  Her free T4 on her initial consultation was about the same as in the hospital She was continued on methimazole  She has been on methimazole 5 mg twice daily initially and now 7.5 mg daily which was recommended on 06/29/2019 At that time her free T4 had come down significantly from mildly high levels and T3 was down to 2.9 She has no side effects from methimazole such as nausea, hives or joint pains Also on 50 mg of metoprolol twice daily from cardiologist  She feels a little stronger, no shakiness or palpitations Weight is about the same even though she thinks she is eating less Still complaining of excessive cold intolerance as before  Eye disease: See review of systems  Labs are pending  Wt Readings from Last 3 Encounters:  08/14/19 207 lb 6.4 oz (94.1 kg)  07/25/19 209 lb (94.8 kg)  06/28/19 208 lb 9.6 oz (94.6 kg)     Thyroid function tests as follows:     Lab Results  Component Value Date   FREET4 1.00  06/28/2019   FREET4 1.81 (H) 06/08/2019   FREET4 1.86 (H) 05/31/2019   T3FREE 2.9 06/28/2019   T3FREE 3.5 06/08/2019   T3FREE 5.5 (H) 05/31/2019   TSH <0.01 (L) 06/28/2019   TSH <0.010 (L) 05/30/2019    Lab Results  Component Value Date   THYROTRECAB 3.92 (H) 06/01/2019     Allergies as of 08/14/2019      Reactions   Hydrochlorothiazide Other (See Comments), Hypertension   Wildly fluctuates B/P low-to-high, then high-to-low, in a matter of minutes   Hydrocodone-acetaminophen Nausea And Vomiting   Other Nausea And Vomiting, Other (See Comments)   Stronger pain meds (post-op) caused dehydration and constipation      Medication List       Accurate as of August 14, 2019  3:49 PM. If you have any questions, ask your nurse or doctor.        Aczone 5 % topical gel Generic drug: Dapsone Apply 1 application topically See  admin instructions. Apply to the face daily as directed   adapalene 0.1 % gel Commonly known as: DIFFERIN Apply topically at bedtime.   amoxicillin 500 MG capsule Commonly known as: AMOXIL Take 2,000 mg by mouth See admin instructions. Take 2,000 mg by mouth one hour prior to dental appointments   apixaban 5 MG Tabs tablet Commonly known as: ELIQUIS Take 1 tablet (5 mg total) by mouth 2 (two) times daily.   FLAX SEEDS PO Take by mouth as directed.   furosemide 20 MG tablet Commonly known as: LASIX Take 1 tablet (20 mg total) by mouth daily.   Linseed Oil Oil Take 1 capsule by mouth daily.   loratadine 10 MG tablet Commonly known as: CLARITIN Take 10 mg by mouth daily.   methimazole 5 MG tablet Commonly known as: TAPAZOLE Take 1 and a half tablets by mouth once daily.   metoprolol tartrate 25 MG tablet Commonly known as: LOPRESSOR Take 1 tablet (25 mg total) by mouth 2 (two) times daily.   sulfamethoxazole-trimethoprim 800-160 MG per tablet Commonly known as: BACTRIM DS Take 1 tablet by mouth 2 (two) times daily.   tretinoin 0.1 %  cream Commonly known as: RETIN-A Apply 1 application topically See admin instructions. Apply to face nightly at bedtime   Vitamin D3 25 MCG (1000 UT) Caps Take 1,000 Units by mouth daily.           Past Medical History:  Diagnosis Date  . 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 Asthma    hx of - PT STATES MILD - NO LONGER REQUIRES INHALERS  . Colitis    mild   . GERD (gastroesophageal reflux disease)   . H/O hiatal hernia   . Hypertension    hx of 10 years ago ; PT'S BLOOD PRESSURE RECENTLY ELEVATED WHILE IN HOSP FOR SURG- NOT ON ANY B/P MEDS  . Thyroid disease     Past Surgical History:  Procedure Laterality Date  . KNEE CLOSED REDUCTION Left 06/05/2014   Procedure: CLOSED MANIPULATION KNEE;  Surgeon: Mauri Pole, MD;  Location: [facility];  Service: Orthopedics;  Laterality: Left;  . RIGHT/LEFT HEART CATH AND CORONARY ANGIOGRAPHY N/A 05/31/2019   Procedure: RIGHT/LEFT HEART CATH AND CORONARY ANGIOGRAPHY;  Surgeon: Martinique, Peter M, MD;  Location: [facility];  Service: Cardiovascular;  Laterality: N/A;  . TOTAL KNEE ARTHROPLASTY Left 05/07/2014   Procedure: LEFT TOTAL KNEE ARTHROPLASTY;  Surgeon: Mauri Pole, MD;  Location: [facility];  Service: Orthopedics;  Laterality: Left;  . TOTAL KNEE ARTHROPLASTY Right 06/05/2014   Procedure: RIGHT TOTAL KNEE ARTHROPLASTY;  Surgeon: Mauri Pole, MD;  Location: [facility];  Service: Orthopedics;  Laterality: Right;  . WISDOM TEETH EXTRACTIONS      Family History  Problem Relation Age of Onset  . Hypothyroidism Mother   . Graves' disease Sister   . Diabetes Brother   . Diabetes Maternal Aunt     Social History:  reports that she has never smoked. She has never used smokeless tobacco. She reports that she does not drink alcohol or use drugs.  Allergies:  Allergies  Allergen Reactions  . Hydrochlorothiazide Other (See Comments) and Hypertension    Wildly  fluctuates B/P low-to-high, then high-to-low, in a matter of minutes  . Hydrocodone-Acetaminophen Nausea And Vomiting  . Other Nausea And Vomiting and Other (See Comments)    Stronger pain meds (post-op)  caused dehydration and constipation     Review of Systems  Constitutional: Positive for reduced appetite.   She has been on Lasix 20 mg daily and potassium supplements  Lab Results  Component Value Date   K 3.8 06/16/2019   No significant history of hypertension in the past and is only on metoprolol with history of CHF  Home blood pressure readings are being monitored regularly  BP Readings from Last 3 Encounters:  08/14/19 136/82  07/25/19 (!) 142/80  06/28/19 140/70       Examination:   BP 136/82 (BP Location: Right Arm, Patient Position: Sitting, Cuff Size: Large)   Pulse 61   Ht 5\' 1"  (1.549 m)   Wt 207 lb 6.4 oz (94.1 kg)   SpO2 99%   BMI 39.19 kg/m   No tremor Reflexes at the biceps are brisk  No obviously reduced range of ocular movement laterally, has mild diplopia when looking to the left lateral extreme She has moderate swelling of the eyelids but no proptosis   Assessment/Plan:   Hyperthyroidism, from Graves' disease with baseline thyrotropin receptor antibody of 3.9  She has been on methimazole since early September  She overall feels better, does have chronic cold intolerance which appears to be unrelated Has no baseline goiter Exam she has brisk reflexes but this is not new  Currently on 7.5 mg methimazole daily without side effects We will check her thyroid labs and decide on treatment changes  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 Toy Cookey in a few days and will await her input In the meantime she can try OTC selenium  Elayne Snare 08/14/2019, 3:49 PM    Note: This office note was prepared with Dragon voice recognition system technology. Any transcriptional  errors that result from this process are unintentional.  Addendum: Free T4 is low normal at 0.79 TSH 3.0 She will reduce methimazole to 5 mg daily Thyrotropin receptor antibody higher and likely explains her ophthalmopathy

## 2019-08-14 NOTE — Patient Instructions (Signed)
Selenium 200mg  2x daily

## 2019-08-15 LAB — TSH: TSH: 3.01 u[IU]/mL (ref 0.35–4.50)

## 2019-08-15 LAB — T3, FREE: T3, Free: 3 pg/mL (ref 2.3–4.2)

## 2019-08-15 LAB — THYROTROPIN RECEPTOR AUTOABS: Thyrotropin Receptor Ab: 5.57 IU/L — ABNORMAL HIGH (ref 0.00–1.75)

## 2019-08-15 LAB — T4, FREE: Free T4: 0.7 ng/dL (ref 0.60–1.60)

## 2019-08-23 DIAGNOSIS — H02884 Meibomian gland dysfunction left upper eyelid: Secondary | ICD-10-CM | POA: Diagnosis not present

## 2019-08-23 DIAGNOSIS — H10523 Angular blepharoconjunctivitis, bilateral: Secondary | ICD-10-CM | POA: Diagnosis not present

## 2019-08-23 DIAGNOSIS — H532 Diplopia: Secondary | ICD-10-CM | POA: Diagnosis not present

## 2019-08-23 DIAGNOSIS — E063 Autoimmune thyroiditis: Secondary | ICD-10-CM | POA: Diagnosis not present

## 2019-08-28 ENCOUNTER — Telehealth: Payer: Self-pay

## 2019-08-28 ENCOUNTER — Other Ambulatory Visit: Payer: Self-pay

## 2019-08-28 ENCOUNTER — Telehealth: Payer: Self-pay | Admitting: Cardiology

## 2019-08-28 ENCOUNTER — Encounter: Payer: Self-pay | Admitting: *Deleted

## 2019-08-28 DIAGNOSIS — I480 Paroxysmal atrial fibrillation: Secondary | ICD-10-CM

## 2019-08-28 NOTE — Telephone Encounter (Signed)
Called Dr. Corky Sing office back and gave them this information.

## 2019-08-28 NOTE — Telephone Encounter (Signed)
Mickel Baas with Dr. Corky Sing office requested a callback at [phone]. She stated that Dr. Toy Cookey wanted to know if Dr. Dwyane Malissie was okay with the pt stopping Eliquis and starting Diclofenac or another NSAID because of swollen/inflamed eyes accompanied with blurred vision. Are you okay with d/c'ing the Eliquis?

## 2019-08-28 NOTE — Progress Notes (Signed)
Patient ID: Christina [name], female   DOB: 05/19/1951, 67 y.o.   MRN: [id number] Patient enrolled for [employer] to ship a 30 day cardiac event monitor to her home.  Instructions included in the monitor kit. 

## 2019-08-28 NOTE — Telephone Encounter (Signed)
See my chart message from patient also

## 2019-08-28 NOTE — Telephone Encounter (Signed)
New Message  Mickel Baas from Dr. Corky Sing office is calling to see if the Pt can stop the prescription Eliquis to get on the prescription diclosenac. The pt also wants a copy of her recent labs as well.  Please call.

## 2019-08-28 NOTE — Telephone Encounter (Signed)
Am only treating her for hyperthyroidism and the question about Eliquis will be answered by the doctor prescribing this

## 2019-08-28 NOTE — Telephone Encounter (Signed)
Spoke with Dr. Corky Sing office and let them know that Dr. Radford Pax said it was okay for the patient to stop Eliquis.

## 2019-08-28 NOTE — Progress Notes (Signed)
Ordering 30 day event monitor per Dr. Radford Pax to rule out silent atrial fibrillation.

## 2019-09-21 ENCOUNTER — Other Ambulatory Visit: Payer: Self-pay

## 2019-09-25 ENCOUNTER — Ambulatory Visit (INDEPENDENT_AMBULATORY_CARE_PROVIDER_SITE_OTHER): Payer: Medicare Other | Admitting: Endocrinology

## 2019-09-25 ENCOUNTER — Encounter: Payer: Self-pay | Admitting: Endocrinology

## 2019-09-25 ENCOUNTER — Other Ambulatory Visit: Payer: Self-pay

## 2019-09-25 VITALS — BP 136/78 | HR 75 | Temp 98.1°F | Ht 61.0 in | Wt 214.4 lb

## 2019-09-25 DIAGNOSIS — R609 Edema, unspecified: Secondary | ICD-10-CM

## 2019-09-25 DIAGNOSIS — E0500 Thyrotoxicosis with diffuse goiter without thyrotoxic crisis or storm: Secondary | ICD-10-CM | POA: Diagnosis not present

## 2019-09-25 DIAGNOSIS — E0590 Thyrotoxicosis, unspecified without thyrotoxic crisis or storm: Secondary | ICD-10-CM | POA: Diagnosis not present

## 2019-09-25 NOTE — Progress Notes (Signed)
Patient ID: Christina [name], female   DOB: April 07, 1951, 69 y.o.   MRN: [id number]                                                                                                               Reason for Appointment:  Hyperthyroidism, follow-up visit   Chief complaint: Weakness   History of Present Illness:   Prior history: For the last several years the patient has had symptoms of occasional palpitations, shortness of breath on exertion, shakiness and feeling somewhat warm.  However she did not have any recent worsening of the symptoms except on the morning of hospitalization She previously did not have any unusual weight loss, anxiety or fatigue She was also having some swelling of her legs prior to admission Since she was having difficulties with tachycardia and atrial fibrillation her thyroid level was checked in the hospital and indicated hyperthyroidism  RECENT HISTORY:  Baseline symptoms before treatment were occasional palpitations, shortness of breath on exertion, shakiness and feeling warm She has Graves' disease confirmed with a baseline thyrotropin receptor antibody level of 3.9  She has been treated with methimazole She has been on gradually decreasing doses of methimazole, now taking 5 mg once a day, was on twice daily initially Her dose was decreased on her last visit in 11/20  Overall she feels fairly good with no unusual fatigue, palpitations or change in cold intolerance Her weight has gone up possibly from recent steroid dose  She has had no problems with her eyes since her last visit  OPHTHALMOPATHY:  Since about 11/20 she was having more swelling of her eyes, some double vision, and a pressure sensation She had seen a specialist after that but no treatment recommended, she was not felt to be a candidate for Tepezza  However in December she had more fuzzy vision, feeling of fullness in her eyes and continued double vision She went to the urgent care center and was  given a Medrol Dosepak She stopped this about 2 days ago With this her visual symptoms are much better and she is having less of a pressure sensation  Labs are pending  Wt Readings from Last 3 Encounters:  09/25/19 214 lb 6.4 oz (97.3 kg)  08/14/19 207 lb 6.4 oz (94.1 kg)  07/25/19 209 lb (94.8 kg)     Thyroid function tests as follows:     Lab Results  Component Value Date   FREET4 0.70 08/14/2019   FREET4 1.00 06/28/2019   FREET4 1.81 (H) 06/08/2019   T3FREE 3.0 08/14/2019   T3FREE 2.9 06/28/2019   T3FREE 3.5 06/08/2019   TSH 3.01 08/14/2019   TSH <0.01 (L) 06/28/2019   TSH <0.010 (L) 05/30/2019    Lab Results  Component Value Date   THYROTRECAB 5.57 (H) 08/14/2019   THYROTRECAB 3.92 (H) 06/01/2019     Allergies as of 09/25/2019      Reactions   Hydrochlorothiazide Other (See Comments), Hypertension   Wildly fluctuates B/P low-to-high, then high-to-low, in a matter of minutes  Hydrocodone-acetaminophen Nausea And Vomiting   Other Nausea And Vomiting, Other (See Comments)   Stronger pain meds (post-op) caused dehydration and constipation      Medication List       Accurate as of September 25, 2019  3:04 PM. If you have any questions, ask your nurse or doctor.        Aczone 5 % topical gel Generic drug: Dapsone Apply 1 application topically See admin instructions. Apply to the face daily as directed   adapalene 0.1 % gel Commonly known as: DIFFERIN Apply topically at bedtime.   amoxicillin 500 MG capsule Commonly known as: AMOXIL Take 2,000 mg by mouth See admin instructions. Take 2,000 mg by mouth one hour prior to dental appointments   apixaban 5 MG Tabs tablet Commonly known as: ELIQUIS Take 1 tablet (5 mg total) by mouth 2 (two) times daily.   diclofenac 50 MG tablet Commonly known as: CATAFLAM Take 50 mg by mouth 2 (two) times daily.   FLAX SEEDS PO Take by mouth as directed.   furosemide 20 MG tablet Commonly known as: LASIX Take 1 tablet  (20 mg total) by mouth daily.   Linseed Oil Oil Take 1 capsule by mouth daily.   loratadine 10 MG tablet Commonly known as: CLARITIN Take 10 mg by mouth daily.   methimazole 5 MG tablet Commonly known as: TAPAZOLE Take 1 and a half tablets by mouth once daily.   metoprolol tartrate 25 MG tablet Commonly known as: LOPRESSOR Take 1 tablet (25 mg total) by mouth 2 (two) times daily.   selenium 200 MCG Tabs tablet Take by mouth daily.   sulfamethoxazole-trimethoprim 800-160 MG per tablet Commonly known as: BACTRIM DS Take 1 tablet by mouth 2 (two) times daily.   tretinoin 0.1 % cream Commonly known as: RETIN-A Apply 1 application topically See admin instructions. Apply to face nightly at bedtime   Vitamin D3 25 MCG (1000 UT) Caps Take 1,000 Units by mouth daily.           Past Medical History:  Diagnosis Date  . 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 Asthma    hx of - PT STATES MILD - NO LONGER REQUIRES INHALERS  . Colitis    mild   . GERD (gastroesophageal reflux disease)   . H/O hiatal hernia   . Hypertension    hx of 10 years ago ; PT'S BLOOD PRESSURE RECENTLY ELEVATED WHILE IN HOSP FOR SURG- NOT ON ANY B/P MEDS  . Thyroid disease     Past Surgical History:  Procedure Laterality Date  . KNEE CLOSED REDUCTION Left 06/05/2014   Procedure: CLOSED MANIPULATION KNEE;  Surgeon: Mauri Pole, MD;  Location: [facility];  Service: Orthopedics;  Laterality: Left;  . RIGHT/LEFT HEART CATH AND CORONARY ANGIOGRAPHY N/A 05/31/2019   Procedure: RIGHT/LEFT HEART CATH AND CORONARY ANGIOGRAPHY;  Surgeon: Martinique, Peter M, MD;  Location: [facility];  Service: Cardiovascular;  Laterality: N/A;  . TOTAL KNEE ARTHROPLASTY Left 05/07/2014   Procedure: LEFT TOTAL KNEE ARTHROPLASTY;  Surgeon: Mauri Pole, MD;  Location: [facility];  Service: Orthopedics;  Laterality: Left;  . TOTAL KNEE ARTHROPLASTY Right 06/05/2014    Procedure: RIGHT TOTAL KNEE ARTHROPLASTY;  Surgeon: Mauri Pole, MD;  Location: [facility];  Service: Orthopedics;  Laterality: Right;  . WISDOM TEETH EXTRACTIONS      Family History  Problem Relation Age  of Onset  . Hypothyroidism Mother   . Graves' disease Sister   . Diabetes Brother   . Diabetes Maternal Aunt     Social History:  reports that she has never smoked. She has never used smokeless tobacco. She reports that she does not drink alcohol or use drugs.  Allergies:  Allergies  Allergen Reactions  . Hydrochlorothiazide Other (See Comments) and Hypertension    Wildly fluctuates B/P low-to-high, then high-to-low, in a matter of minutes  . Hydrocodone-Acetaminophen Nausea And Vomiting  . Other Nausea And Vomiting and Other (See Comments)    Stronger pain meds (post-op) caused dehydration and constipation     Review of Systems  She has been on Lasix 20 mg daily and potassium supplements  Lab Results  Component Value Date   K 3.8 06/16/2019   No history of hypertension in the past and is only on metoprolol with history of CHF She thinks her pulse is relatively slow at home  Home blood pressure readings are being monitored regularly  BP Readings from Last 3 Encounters:  09/25/19 136/78  08/14/19 136/82  07/25/19 (!) 142/80   She is on Bactrim long-term for acne    Examination:   BP 136/78 (BP Location: Left Arm, Patient Position: Sitting, Cuff Size: Normal)   Pulse 75   Temp 98.1 F (36.7 C)   Ht 5\' 1"  (1.549 m)   Wt 214 lb 6.4 oz (97.3 kg)   SpO2 99%   BMI 40.51 kg/m   She has mild swelling of upper and lower eyelids No pedal edema   Assessment/Plan:   Hyperthyroidism, from Graves' disease with baseline thyrotropin receptor antibody of 3.9  She has been on methimazole since early September 2020   She overall feels fairly good with no fatigue, palpitations or shortness of breath 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 as before  Has no baseline  goiter  Her methimazole was reduced by 2.5 mg in 11/20 with low normal free T4 and high normal TSH Currently on 5.0 mg methimazole daily without side effects  We will check her thyroid labs and decide on treatment dosage Also will check her thyrotropin receptor antibody  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 Toy Cookey again We should be able to administer IV steroids in the office through our nurse Although she may be a candidate for Netty Starring this would likely be rather expensive for her  Meanwhile would not like her to continue on diclofenac long-term especially with her history of cardiac issues, using Bactrim and needing Lasix.  She has not benefited from this for her heart disease  We will check metabolic panel today Also recommend that she reduce her Bactrim to once a day which she is using for acne    Elayne Snare 09/25/2019, 3:04 PM    Note: This office note was prepared with Dragon voice recognition system technology. Any transcriptional errors that result from this process are unintentional.

## 2019-09-26 LAB — CBC
HCT: 41.5 % (ref 36.0–46.0)
Hemoglobin: 13.6 g/dL (ref 12.0–15.0)
MCHC: 32.8 g/dL (ref 30.0–36.0)
MCV: 92.9 fl (ref 78.0–100.0)
Platelets: 316 10*3/uL (ref 150.0–400.0)
RBC: 4.47 Mil/uL (ref 3.87–5.11)
RDW: 16.1 % — ABNORMAL HIGH (ref 11.5–15.5)
WBC: 9.5 10*3/uL (ref 4.0–10.5)

## 2019-09-26 LAB — BASIC METABOLIC PANEL
BUN: 26 mg/dL — ABNORMAL HIGH (ref 6–23)
CO2: 30 mEq/L (ref 19–32)
Calcium: 9.3 mg/dL (ref 8.4–10.5)
Chloride: 97 mEq/L (ref 96–112)
Creatinine, Ser: 1.29 mg/dL — ABNORMAL HIGH (ref 0.40–1.20)
GFR: 41.34 mL/min — ABNORMAL LOW (ref >60.00)
Glucose, Bld: 112 mg/dL — ABNORMAL HIGH (ref 70–99)
Potassium: 5.4 mEq/L — ABNORMAL HIGH (ref 3.5–5.1)
Sodium: 135 mEq/L (ref 135–145)

## 2019-09-26 LAB — TSH: TSH: 4.83 u[IU]/mL — ABNORMAL HIGH (ref 0.35–4.50)

## 2019-09-26 LAB — T4, FREE: Free T4: 0.65 ng/dL (ref 0.60–1.60)

## 2019-09-26 LAB — THYROTROPIN RECEPTOR AUTOABS: Thyrotropin Receptor Ab: 4.09 IU/L — ABNORMAL HIGH (ref 0.00–1.75)

## 2019-10-06 DIAGNOSIS — J302 Other seasonal allergic rhinitis: Secondary | ICD-10-CM | POA: Insufficient documentation

## 2019-10-06 DIAGNOSIS — H04123 Dry eye syndrome of bilateral lacrimal glands: Secondary | ICD-10-CM | POA: Insufficient documentation

## 2019-10-06 DIAGNOSIS — H40009 Preglaucoma, unspecified, unspecified eye: Secondary | ICD-10-CM | POA: Insufficient documentation

## 2019-10-06 DIAGNOSIS — H532 Diplopia: Secondary | ICD-10-CM | POA: Insufficient documentation

## 2019-10-06 DIAGNOSIS — E063 Autoimmune thyroiditis: Secondary | ICD-10-CM | POA: Diagnosis not present

## 2019-10-09 ENCOUNTER — Other Ambulatory Visit: Payer: Self-pay | Admitting: Endocrinology

## 2019-10-16 ENCOUNTER — Other Ambulatory Visit: Payer: Self-pay

## 2019-10-16 ENCOUNTER — Telehealth: Payer: Self-pay | Admitting: Endocrinology

## 2019-10-16 ENCOUNTER — Other Ambulatory Visit: Payer: Self-pay | Admitting: Endocrinology

## 2019-10-16 DIAGNOSIS — E0500 Thyrotoxicosis with diffuse goiter without thyrotoxic crisis or storm: Secondary | ICD-10-CM

## 2019-10-16 MED ORDER — PREDNISONE 10 MG PO TABS: ORAL_TABLET | ORAL | 1 refills | Status: DC

## 2019-10-16 NOTE — Telephone Encounter (Signed)
Patient called re: the following:  Patient requests to be called at ph# [phone] to be given the name and contact information of an Ophthalmologist  that Dr. Dwyane Jhayla works with for Thyroid Eye Disease/patient prefers Dr. Dwyane Deysha Refer patient to and schedule the appointment for patient to see the above Ophthalmologist . Patient will no longer be seeing her current Ophthalmologist .  Also, Patient requests the follow new RX due to patient's eyes are much worse than before:  MEDICATION: Prednisone 6 day pack  PHARMACY:   [place], [place] - [address] Phone:  [phone]  Fax:  [phone]      IS THIS A 90 DAY SUPPLY : No  IS PATIENT OUT OF MEDICATION: No  IF NOT; HOW MUCH IS LEFT: 0  LAST APPOINTMENT DATE: @1 /18/2021  NEXT APPOINTMENT DATE:@2 /04/2020  DO WE HAVE YOUR PERMISSION TO LEAVE A DETAILED MESSAGE: Yes  OTHER COMMENTS:    **Let patient know to contact pharmacy at the end of the day to make sure medication is ready. **  ** Please notify patient to allow 48-72 hours to process**  **Encourage patient to contact the pharmacy for refills or they can request refills through [facility]**

## 2019-10-16 NOTE — Telephone Encounter (Signed)
Rx sent and pt called and notified. She did verbalize understanding of this.

## 2019-10-16 NOTE — Telephone Encounter (Signed)
Please confirm what symptoms she is having with her eyes.  I would prefer that she go on a daily dose of prednisone instead of repeating the courses.  Will be referring her to Dr. Everitt Amber for ophthalmology

## 2019-10-16 NOTE — Telephone Encounter (Signed)
10 mg daily will be continued indefinitely, you can send 45 tablets with 1 refill

## 2019-10-16 NOTE — Telephone Encounter (Signed)
Pt stated that her symptoms are very much like they were previously.  Symptoms like double vision (things far off and up close), dry and sore.  Right eye hurting all day, rather than intermittently previously, and stated that she can feel her eyes are swollen. When she gets a break, she does cold compress, but the moment she stops, the pain resumes.

## 2019-10-16 NOTE — Telephone Encounter (Signed)
Pt wants to know if she should contact Dr. Annamaria Boots?

## 2019-10-16 NOTE — Telephone Encounter (Signed)
Please send her 10 mg prednisone tablets, take 3 pills daily for 3 days then 2 pills daily for another 3 days and then 1 pill daily at breakfast.  I sending the referral

## 2019-10-16 NOTE — Telephone Encounter (Signed)
Is the one pill daily at breakfast a maintenance dose to be continued until further notice? Or only for 3 days?

## 2019-10-24 ENCOUNTER — Telehealth: Payer: Self-pay | Admitting: Endocrinology

## 2019-10-24 NOTE — Telephone Encounter (Signed)
[facility] is calling to Request for Office Notes before patients eye appointments  Please send to [phone]  Attn: Dr Sabino Dick

## 2019-10-24 NOTE — Telephone Encounter (Signed)
Chart notes printed and will be faxed.

## 2019-10-27 ENCOUNTER — Telehealth: Payer: Self-pay

## 2019-10-27 DIAGNOSIS — E0590 Thyrotoxicosis, unspecified without thyrotoxic crisis or storm: Secondary | ICD-10-CM | POA: Insufficient documentation

## 2019-10-27 DIAGNOSIS — H532 Diplopia: Secondary | ICD-10-CM | POA: Diagnosis not present

## 2019-10-27 DIAGNOSIS — E0500 Thyrotoxicosis with diffuse goiter without thyrotoxic crisis or storm: Secondary | ICD-10-CM | POA: Diagnosis not present

## 2019-10-27 DIAGNOSIS — E039 Hypothyroidism, unspecified: Secondary | ICD-10-CM | POA: Insufficient documentation

## 2019-10-27 DIAGNOSIS — H04129 Dry eye syndrome of unspecified lacrimal gland: Secondary | ICD-10-CM | POA: Diagnosis not present

## 2019-10-27 DIAGNOSIS — H40053 Ocular hypertension, bilateral: Secondary | ICD-10-CM | POA: Diagnosis not present

## 2019-10-27 NOTE — Telephone Encounter (Signed)
Dr. Sabino Dick called to speak to Dr. Dwyane Jhanvi about this patient -he is requesting a call back at your earliest convenience to discuss patients continuant of care-non urgent-good call back number is [phone]

## 2019-10-29 NOTE — Telephone Encounter (Signed)
She will need to be started on Solu-Medrol 500 mg infusions every week. This will be done over 30 minutes.  Linda please confirm with [facility] pharmacy how much to dilute the infusion. Olen Cordial please let the patient know we will prescribe this to her pharmacy once we have the infusion protocol

## 2019-10-30 ENCOUNTER — Other Ambulatory Visit: Payer: Self-pay | Admitting: Ophthalmology

## 2019-10-30 ENCOUNTER — Ambulatory Visit (INDEPENDENT_AMBULATORY_CARE_PROVIDER_SITE_OTHER): Payer: Medicare Other | Admitting: Endocrinology

## 2019-10-30 ENCOUNTER — Other Ambulatory Visit: Payer: Self-pay

## 2019-10-30 ENCOUNTER — Encounter: Payer: Self-pay | Admitting: Endocrinology

## 2019-10-30 VITALS — BP 136/76 | HR 114 | Ht 61.0 in | Wt 206.4 lb

## 2019-10-30 DIAGNOSIS — H40053 Ocular hypertension, bilateral: Secondary | ICD-10-CM | POA: Diagnosis not present

## 2019-10-30 DIAGNOSIS — E0500 Thyrotoxicosis with diffuse goiter without thyrotoxic crisis or storm: Secondary | ICD-10-CM | POA: Diagnosis not present

## 2019-10-30 DIAGNOSIS — E0590 Thyrotoxicosis, unspecified without thyrotoxic crisis or storm: Secondary | ICD-10-CM | POA: Diagnosis not present

## 2019-10-30 DIAGNOSIS — E875 Hyperkalemia: Secondary | ICD-10-CM | POA: Diagnosis not present

## 2019-10-30 DIAGNOSIS — H04123 Dry eye syndrome of bilateral lacrimal glands: Secondary | ICD-10-CM | POA: Diagnosis not present

## 2019-10-30 MED ORDER — METHYLPREDNISOLONE SODIUM SUCC 500 MG IJ SOLR: 500.0000 mg | Freq: Once | INTRAMUSCULAR | 5 refills | Status: AC

## 2019-10-30 NOTE — Telephone Encounter (Signed)
Yes

## 2019-10-30 NOTE — Telephone Encounter (Signed)
The infusion is to be be completed in this office?

## 2019-10-30 NOTE — Progress Notes (Signed)
Patient ID: Christina [name], female   DOB: 08-Jul-1951, 69 y.o.   MRN: [id number]                                                                                                               Reason for Appointment:  Hyperthyroidism, follow-up visit   Chief complaint:    History of Present Illness:   Prior history: For the last several years the patient has had symptoms of occasional palpitations, shortness of breath on exertion, shakiness and feeling somewhat warm.  However she did not have any recent worsening of the symptoms except on the morning of hospitalization She previously did not have any unusual weight loss, anxiety or fatigue She was also having some swelling of her legs prior to admission Since she was having difficulties with tachycardia and atrial fibrillation her thyroid level was checked in the hospital and indicated hyperthyroidism  RECENT HISTORY:  Baseline symptoms before treatment were occasional palpitations, shortness of breath on exertion, shakiness and feeling warm She has Graves' disease confirmed with a baseline thyrotropin receptor antibody level of 3.9  She has been treated with methimazole She has been on gradually decreasing doses of methimazole, now taking 2.5 mg once a day, was on twice daily initially Her dose was decreased on her last visit in 09/2019 At that time she was having some more fatigue, weight gain and cold intolerance Also her free T4 was low normal with TSH being high at 4.8  Since then she feels fairly good with no unusual fatigue or weakness Weight has come down Not as cold recently and no increased hair loss  Last thyrotropin receptor antibody in 1/21 was still high at 4.1    OPHTHALMOPATHY:  Since about 11/20 she was having more swelling of her eyes, some double vision, and a pressure sensation Subsequently in December she had more fuzzy vision, feeling of fullness in her eyes and continued double vision She went to the urgent  care center and was given a Medrol Dosepak  With the recurrence of the symptoms in 1/21 she was again started on tapering dose of prednisone and now taking 10 mg daily With this her visual and ocular symptoms are much better Pressure sensation is better and her diplopia is only minimal  Her oculoplastic ophthalmologist has recommended Medrol 500 mg weekly injections as Netty Starring is likely to be not available or covered He had called last week and her case was discussed on the telephone  Labs are pending  Wt Readings from Last 3 Encounters:  10/30/19 206 lb 6.4 oz (93.6 kg)  09/25/19 214 lb 6.4 oz (97.3 kg)  08/14/19 207 lb 6.4 oz (94.1 kg)     Thyroid function tests as follows:     Lab Results  Component Value Date   FREET4 0.65 09/25/2019   FREET4 0.70 08/14/2019   FREET4 1.00 06/28/2019   T3FREE 3.0 08/14/2019   T3FREE 2.9 06/28/2019   T3FREE 3.5 06/08/2019   TSH 4.83 (H) 09/25/2019   TSH 3.01 08/14/2019  TSH <0.01 (L) 06/28/2019    Lab Results  Component Value Date   THYROTRECAB 4.09 (H) 09/25/2019   THYROTRECAB 5.57 (H) 08/14/2019   THYROTRECAB 3.92 (H) 06/01/2019     Allergies as of 10/30/2019      Reactions   Hydrochlorothiazide Other (See Comments), Hypertension   Wildly fluctuates B/P low-to-high, then high-to-low, in a matter of minutes   Hydrocodone-acetaminophen Nausea And Vomiting   Other Nausea And Vomiting, Other (See Comments)   Stronger pain meds (post-op) caused dehydration and constipation      Medication List       Accurate as of October 30, 2019  3:13 PM. If you have any questions, ask your nurse or doctor.        STOP taking these medications   apixaban 5 MG Tabs tablet Commonly known as: ELIQUIS Stopped by: Elayne Snare, MD   FLAX SEEDS PO Stopped by: Elayne Snare, MD   metoprolol tartrate 25 MG tablet Commonly known as: LOPRESSOR Stopped by: Elayne Snare, MD     TAKE these medications   Aczone 5 % topical gel Generic drug:  Dapsone Apply 1 application topically See admin instructions. Apply to the face daily as directed   adapalene 0.1 % gel Commonly known as: DIFFERIN Apply topically at bedtime.   amoxicillin 500 MG capsule Commonly known as: AMOXIL Take 2,000 mg by mouth See admin instructions. Take 2,000 mg by mouth one hour prior to dental appointments   diclofenac 50 MG tablet Commonly known as: CATAFLAM Take 50 mg by mouth 2 (two) times daily.   furosemide 20 MG tablet Commonly known as: LASIX Take 1 tablet (20 mg total) by mouth daily.   Linseed Oil Oil Take 1 capsule by mouth daily.   loratadine 10 MG tablet Commonly known as: CLARITIN Take 10 mg by mouth daily.   methimazole 5 MG tablet Commonly known as: TAPAZOLE Take one half tablet by mouth once daily.   predniSONE 10 MG tablet Commonly known as: DELTASONE Take 10 mg by mouth daily with breakfast. What changed: Another medication with the same name was removed. Continue taking this medication, and follow the directions you see here. Changed by: Elayne Snare, MD   selenium 200 MCG Tabs tablet Take by mouth daily.   sulfamethoxazole-trimethoprim 800-160 MG per tablet Commonly known as: BACTRIM DS Take 1 tablet by mouth daily.   tretinoin 0.1 % cream Commonly known as: RETIN-A Apply 1 application topically See admin instructions. Apply to face nightly at bedtime   Vitamin D3 25 MCG (1000 UT) Caps Take 1,000 Units by mouth daily.           Past Medical History:  Diagnosis Date  . 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 Asthma    hx of - PT STATES MILD - NO LONGER REQUIRES INHALERS  . Colitis    mild   . GERD (gastroesophageal reflux disease)   . H/O hiatal hernia   . Hypertension    hx of 10 years ago ; PT'S BLOOD PRESSURE RECENTLY ELEVATED WHILE IN HOSP FOR SURG- NOT ON ANY B/P MEDS  . Thyroid disease     Past Surgical History:  Procedure  Laterality Date  . KNEE CLOSED REDUCTION Left 06/05/2014   Procedure: CLOSED MANIPULATION KNEE;  Surgeon: Mauri Pole, MD;  Location: [facility];  Service: Orthopedics;  Laterality: Left;  . RIGHT/LEFT HEART CATH AND CORONARY ANGIOGRAPHY  N/A 05/31/2019   Procedure: RIGHT/LEFT HEART CATH AND CORONARY ANGIOGRAPHY;  Surgeon: Martinique, Peter M, MD;  Location: [facility];  Service: Cardiovascular;  Laterality: N/A;  . TOTAL KNEE ARTHROPLASTY Left 05/07/2014   Procedure: LEFT TOTAL KNEE ARTHROPLASTY;  Surgeon: Mauri Pole, MD;  Location: [facility];  Service: Orthopedics;  Laterality: Left;  . TOTAL KNEE ARTHROPLASTY Right 06/05/2014   Procedure: RIGHT TOTAL KNEE ARTHROPLASTY;  Surgeon: Mauri Pole, MD;  Location: [facility];  Service: Orthopedics;  Laterality: Right;  . WISDOM TEETH EXTRACTIONS      Family History  Problem Relation Age of Onset  . Hypothyroidism Mother   . Graves' disease Sister   . Diabetes Brother   . Diabetes Maternal Aunt     Social History:  reports that she has never smoked. She has never used smokeless tobacco. She reports that she does not drink alcohol or use drugs.  Allergies:  Allergies  Allergen Reactions  . Hydrochlorothiazide Other (See Comments) and Hypertension    Wildly fluctuates B/P low-to-high, then high-to-low, in a matter of minutes  . Hydrocodone-Acetaminophen Nausea And Vomiting  . Other Nausea And Vomiting and Other (See Comments)    Stronger pain meds (post-op) caused dehydration and constipation     Review of Systems  She has been on Lasix 20 mg daily but recently no potassium supplements Also her diclofenac was stopped because of high potassium She is on Bactrim long-term for acne and was told to reduce it to once a day  Lab Results  Component Value Date   K 5.4 (H) 09/25/2019   No history of hypertension in the past and was only on metoprolol with history of CHF She thinks her pulse is relatively slow at home and she stopped  metoprolol on her own  Home blood pressure readings are being monitored regularly  BP Readings from Last 3 Encounters:  10/30/19 136/76  09/25/19 136/78  08/14/19 136/82       Examination:   BP 136/76 (BP Location: Left Arm, Patient Position: Sitting, Cuff Size: Large)   Pulse (!) 114   Ht 5\' 1"  (1.549 m)   Wt 206 lb 6.4 oz (93.6 kg)   SpO2 97%   BMI 39.00 kg/m   She has swelling of upper and lower eyelids Thyroid not palpable Biceps reflexes are relatively brisk No tremor No facial swelling No pedal edema   Assessment/Plan:   Hyperthyroidism, from Graves' disease with baseline thyrotropin receptor antibody of 3.9  She has been on methimazole since early September 2020   She has had progressively lower requirement for antithyroid drugs now is only on 2.5 mg methimazole Symptomatically she is doing very well and previously had started gaining weight when her thyroid level was trending lower Last TSH was 4.8 However her thyrotropin receptor antibody as of 1/21 was still high and may have been related to her getting ophthalmopathy  Has no goiter on exam still  We will check her thyroid labs and decide on treatment dosage   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 diplopia, some visual difficulties with only minimal proptosis Recently she had worsening of her visual symptoms improved by tapering dose of prednisone  Currently only on 10 mg prednisone  We will need to arrange for her infusion for the Medrol 500 mg recommended by ophthalmologist and not clear where this can be done as well as the coverage for the medication under Medicare part B or part D Prescription sent  to the hospital pharmacy and will proceed with PA if needed  History of HYPERKALEMIA: We will check metabolic panel today Again indicated that she should not take diclofenac because of potential for hyperkalemia and need to follow-up with PCP    Elayne Snare 10/30/2019, 3:13 PM     Note: This office note was prepared with Dragon voice recognition system technology. Any transcriptional errors that result from this process are unintentional.

## 2019-10-31 LAB — BASIC METABOLIC PANEL
BUN: 15 mg/dL (ref 6–23)
CO2: 28 mEq/L (ref 19–32)
Calcium: 10.2 mg/dL (ref 8.4–10.5)
Chloride: 102 mEq/L (ref 96–112)
Creatinine, Ser: 1.09 mg/dL (ref 0.40–1.20)
GFR: 50.2 mL/min — ABNORMAL LOW (ref >60.00)
Glucose, Bld: 164 mg/dL — ABNORMAL HIGH (ref 70–99)
Potassium: 4.2 mEq/L (ref 3.5–5.1)
Sodium: 139 mEq/L (ref 135–145)

## 2019-10-31 LAB — T4, FREE: Free T4: 0.92 ng/dL (ref 0.60–1.60)

## 2019-10-31 LAB — TSH: TSH: 0.2 u[IU]/mL — ABNORMAL LOW (ref 0.35–4.50)

## 2019-10-31 NOTE — Progress Notes (Signed)
Thyroid level is upper normal now.  Methimazole needs to be 5 mg alternating with 2.5 mg.  She needs to check with [facility] on [facility] to see if they can fill her Medrol injection and schedule infusion with Vaughan Basta

## 2019-11-06 ENCOUNTER — Other Ambulatory Visit: Payer: Self-pay

## 2019-11-06 MED ORDER — METHIMAZOLE 5 MG PO TABS: ORAL_TABLET | ORAL | 2 refills | Status: DC

## 2019-11-07 NOTE — Telephone Encounter (Signed)
Per Dr. Ronnie Derby voice order, the infusion is not covered at the MD office and she will need to have it done at the hospital.  I am closing this encounter

## 2019-11-10 ENCOUNTER — Ambulatory Visit
Admission: RE | Admit: 2019-11-10 | Discharge: 2019-11-10 | Disposition: A | Discharge status: Home / Self Care | Payer: Medicare Other | Source: Ambulatory Visit | Attending: Ophthalmology | Admitting: Ophthalmology

## 2019-11-10 ENCOUNTER — Other Ambulatory Visit: Payer: PRIVATE HEALTH INSURANCE

## 2019-11-10 DIAGNOSIS — E0500 Thyrotoxicosis with diffuse goiter without thyrotoxic crisis or storm: Secondary | ICD-10-CM | POA: Diagnosis not present

## 2019-11-10 IMAGING — CT CT ORBITS W/O CM
3 series · 14 of 47 positions shown, 16 images · non-contrast
Comparison: None available

CLINICAL DATA: Graves disease.  Double vision at the orbital apex.

EXAM:
CT ORBITS WITHOUT CONTRAST
TECHNIQUE: Multidetector CT images were obtained using the standard protocol
without intravenous contrast.

[Series 3: orbits 2.00 hr36 s3 axial soft · axial · 0.30mm/px · z∈[-551,-471]mm · 8 of 48 slices shown, 10 images]
[im 4/48  brain]
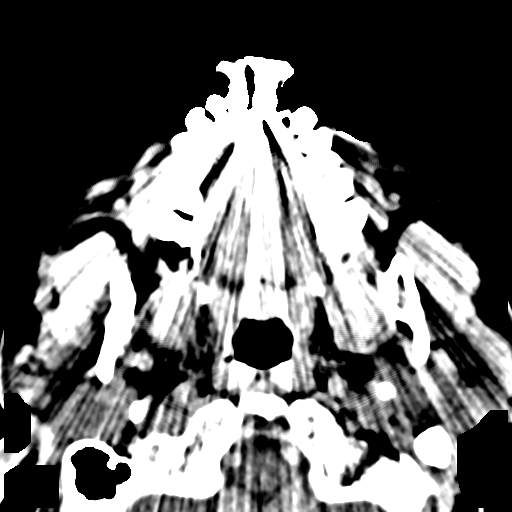
[im 4/48  bone]
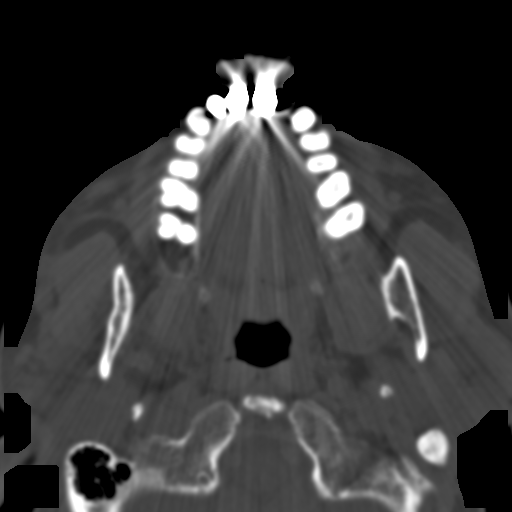
[im 10/48  bone]
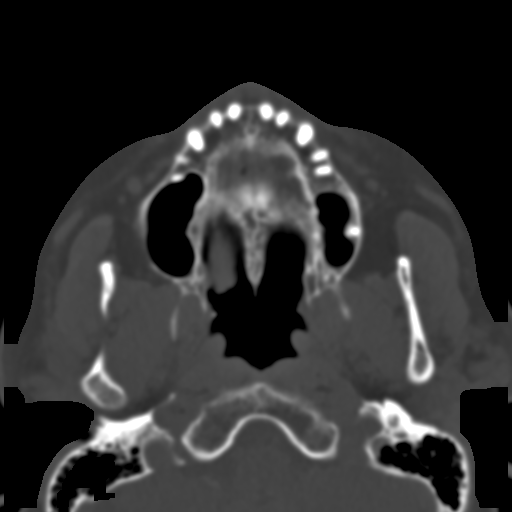
[im 15/48  bone]
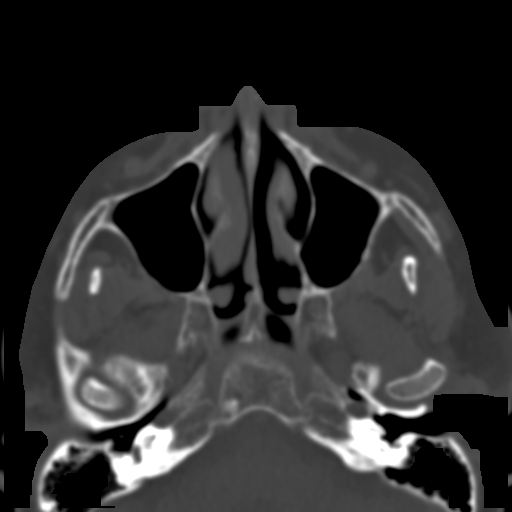
[im 22/48  bone]
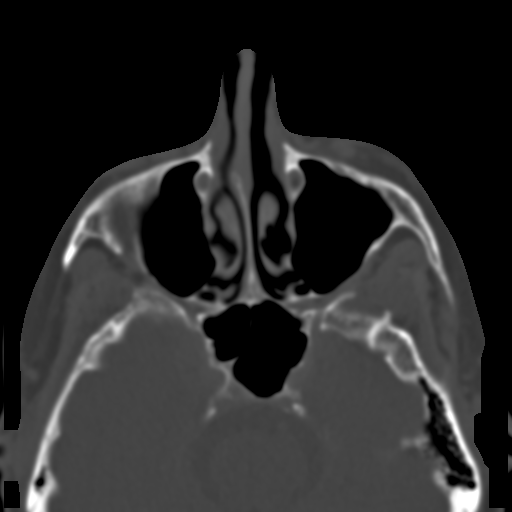
[im 26/48  brain]
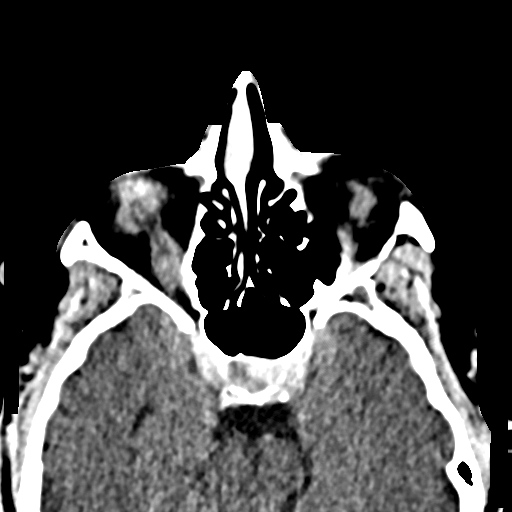
[im 26/48  bone]
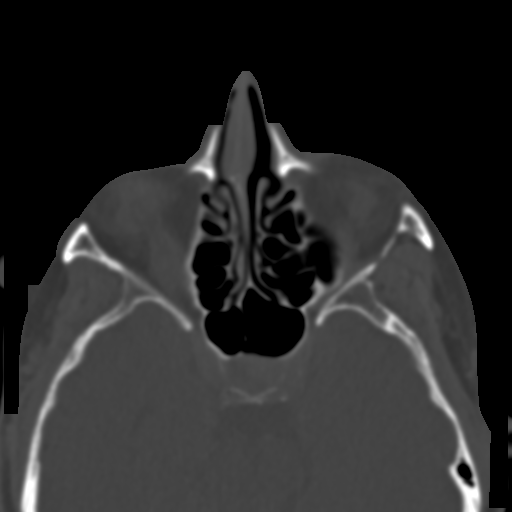
[im 33/48  bone]
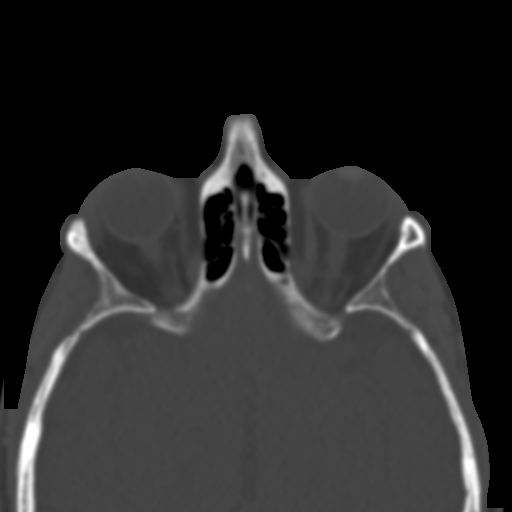
[im 38/48  bone]
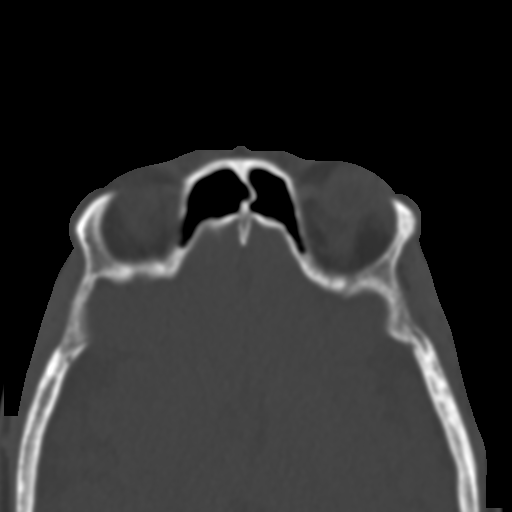
[im 44/48  bone]
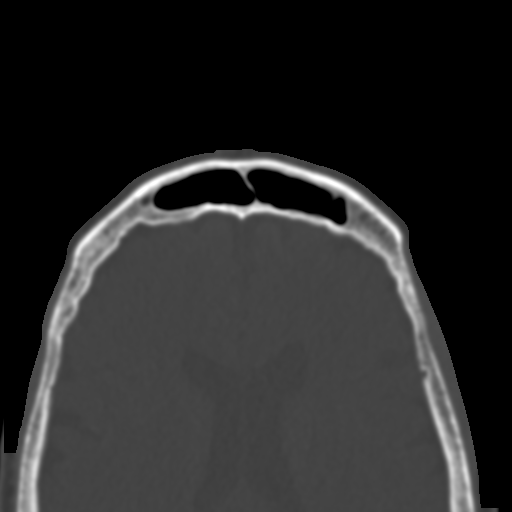

[Series 6: orbits 2.00 hr36 s3 cor soft · coronal · 0.19mm/px · 3 of 77 slices shown]
[im 26/77  bone]
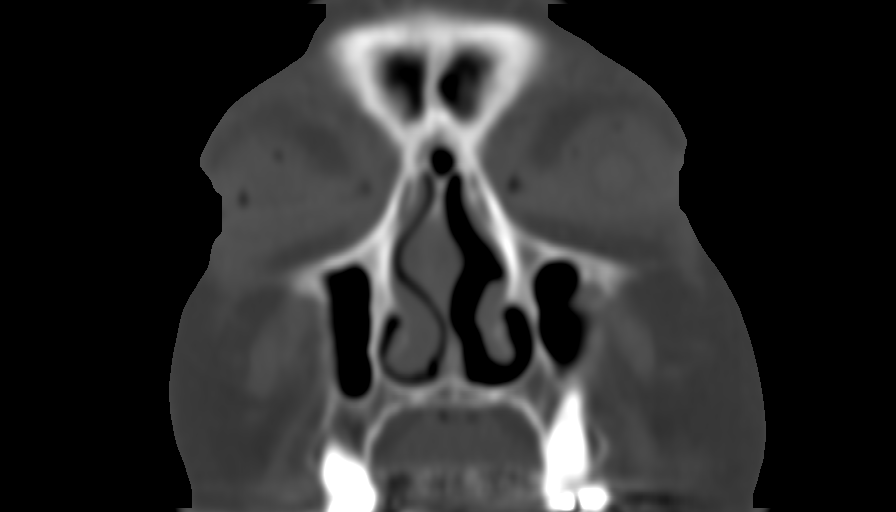
[im 34/77  bone]
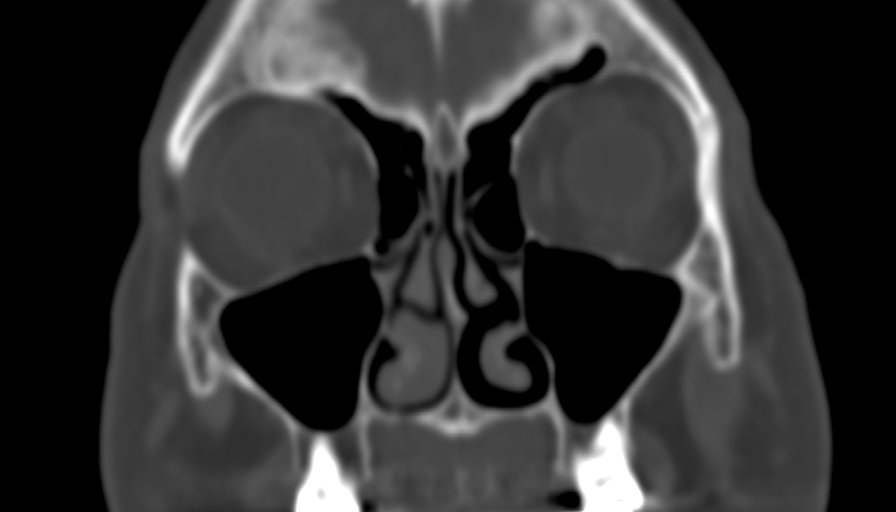
[im 43/77  bone]
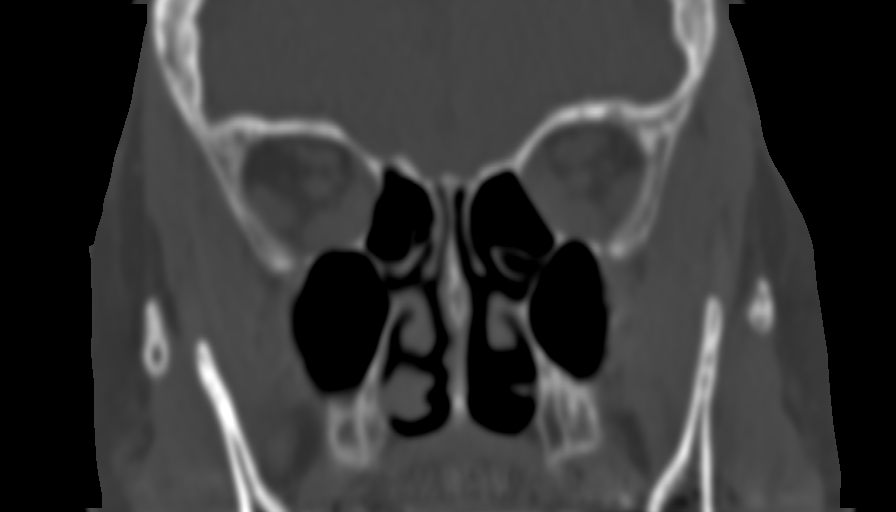

[Series 10: orbits 2.00 hr36 s3 sag soft · sagittal · 0.19mm/px · 3 of 88 slices shown]
[im 30/88  bone]
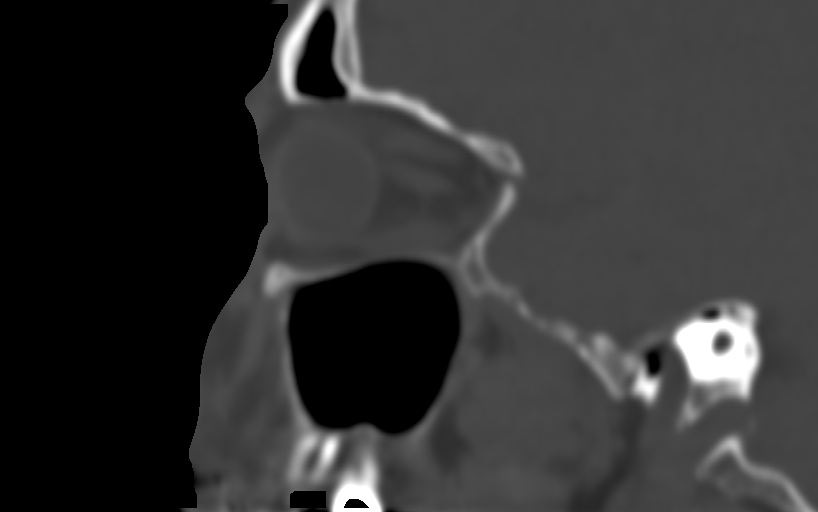
[im 44/88  bone]
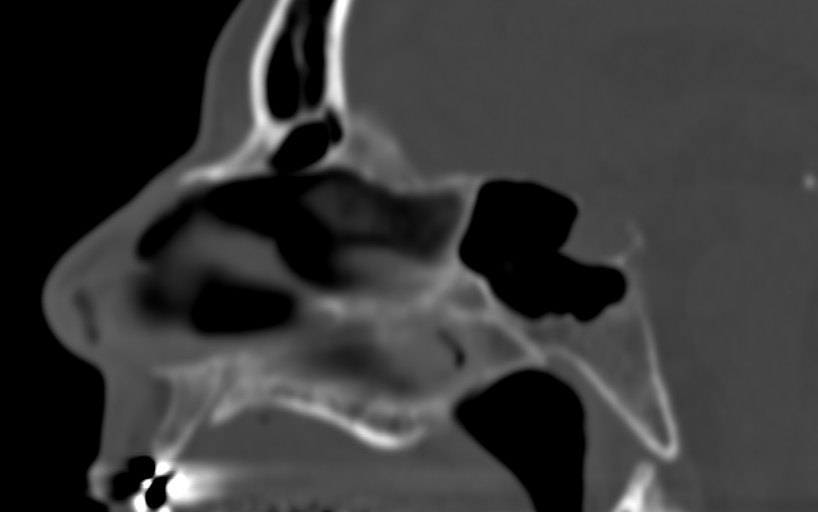
[im 59/88  bone]
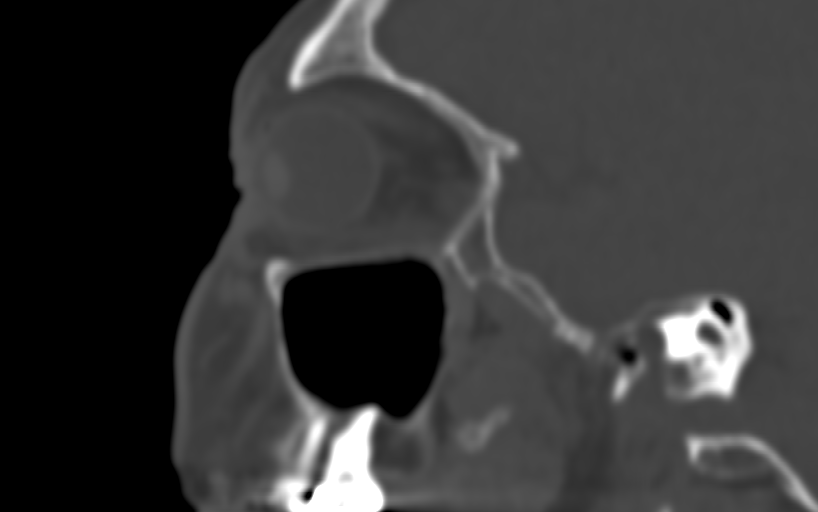

[14 of 47 positions shown; findings below may reference images not displayed]

FINDINGS: Orbits: Extraocular muscle thickening especially involving the
medial and inferior recti, symmetric. The maximum thickness is 6 mm
at the left inferior rectus belly. There is crowding at the orbital
apex but no effacement of fat around the optic nerve sheath
complexes. No superimposed mass. No proptosis.

Visualized sinuses: Clear

Soft tissues: Negative for soft tissue mass or inflammation

Limited intracranial: Atherosclerotic calcification.
IMPRESSION: Symmetric extraocular muscle thickening consistent with BILLIOT
orbitopathy. No proptosis and fat around the optic nerves is not
effaced.

## 2019-11-14 DIAGNOSIS — E0500 Thyrotoxicosis with diffuse goiter without thyrotoxic crisis or storm: Secondary | ICD-10-CM | POA: Diagnosis not present

## 2019-11-16 ENCOUNTER — Other Ambulatory Visit: Payer: Self-pay

## 2019-11-20 ENCOUNTER — Ambulatory Visit (INDEPENDENT_AMBULATORY_CARE_PROVIDER_SITE_OTHER): Payer: Medicare Other | Admitting: Endocrinology

## 2019-11-20 ENCOUNTER — Other Ambulatory Visit: Payer: Self-pay

## 2019-11-20 ENCOUNTER — Encounter: Payer: Self-pay | Admitting: Endocrinology

## 2019-11-20 VITALS — BP 124/82 | HR 68 | Ht 61.0 in | Wt 209.0 lb

## 2019-11-20 DIAGNOSIS — R739 Hyperglycemia, unspecified: Secondary | ICD-10-CM | POA: Diagnosis not present

## 2019-11-20 DIAGNOSIS — E0590 Thyrotoxicosis, unspecified without thyrotoxic crisis or storm: Secondary | ICD-10-CM | POA: Diagnosis not present

## 2019-11-20 DIAGNOSIS — N289 Disorder of kidney and ureter, unspecified: Secondary | ICD-10-CM

## 2019-11-20 DIAGNOSIS — T380X5A Adverse effect of glucocorticoids and synthetic analogues, initial encounter: Secondary | ICD-10-CM

## 2019-11-20 LAB — TSH: TSH: 0.64 u[IU]/mL (ref 0.35–4.50)

## 2019-11-20 LAB — BASIC METABOLIC PANEL
BUN: 12 mg/dL (ref 6–23)
CO2: 29 mEq/L (ref 19–32)
Calcium: 9.2 mg/dL (ref 8.4–10.5)
Chloride: 104 mEq/L (ref 96–112)
Creatinine, Ser: 0.95 mg/dL (ref 0.40–1.20)
GFR: 58.82 mL/min — ABNORMAL LOW (ref >60.00)
Glucose, Bld: 92 mg/dL (ref 70–99)
Potassium: 4.5 mEq/L (ref 3.5–5.1)
Sodium: 139 mEq/L (ref 135–145)

## 2019-11-20 LAB — GLUCOSE, POCT (MANUAL RESULT ENTRY): POC Glucose: 104 mg/dl — AB (ref 70–99)

## 2019-11-20 LAB — T3, FREE: T3, Free: 2.9 pg/mL (ref 2.3–4.2)

## 2019-11-20 LAB — T4, FREE: Free T4: 0.82 ng/dL (ref 0.60–1.60)

## 2019-11-20 NOTE — Progress Notes (Signed)
Patient ID: Christina [name], female   DOB: [birth year], 69 y.o.   MRN: [id number]                                                                                                               Reason for Appointment:  Hyperthyroidism, follow-up visit   Chief complaint: Recheck thyroid   History of Present Illness:   Prior history: For the last several years the patient has had symptoms of occasional palpitations, shortness of breath on exertion, shakiness and feeling somewhat warm.  However she did not have any recent worsening of the symptoms except on the morning of hospitalization She previously did not have any unusual weight loss, anxiety or fatigue She was also having some swelling of her legs prior to admission Since she was having difficulties with tachycardia and atrial fibrillation her thyroid level was checked in the hospital and indicated hyperthyroidism  RECENT HISTORY:  Baseline symptoms before treatment were occasional palpitations, shortness of breath on exertion, shakiness and feeling warm She has Graves' disease confirmed with a baseline thyrotropin receptor antibody level of 3.9  She has been treated with methimazole She had been on gradually decreasing doses of methimazole but with her TSH being low normal in 10/2019 her dose was increased back to 5 mg alternating with 2.5 mg  At that time she was not having any fatigue, heat or cold intolerance or palpitations She had not been on metoprolol Her weight has fluctuated but has been on prednisone also, recently up 3 pounds Her exam has been unremarkable with no goiter recently  Last thyrotropin receptor antibody in 1/21 was still high at 4.1   OPHTHALMOPATHY:  Since about 11/20 she was having more swelling of her eyes, some double vision, and a pressure sensation Subsequently in December she had more fuzzy vision, feeling of fullness in her eyes and continued double vision She went to the urgent care center and was  given a Medrol Dosepak  With the recurrence of the symptoms in 1/21 she was again started on tapering dose of prednisone After consultation with oculoplastic ophthalmologist she has had her recommended Medrol 500 mg weekly injections started last week  Prednisone has been stopped She feels less pressure in the eyes as well as decreasing diplopia and mild decrease in her external eye swelling CT scan of orbits has not shown any proptosis and only thickening of the eye muscles   Labs are pending  Wt Readings from Last 3 Encounters:  11/20/19 209 lb (94.8 kg)  10/30/19 206 lb 6.4 oz (93.6 kg)  09/25/19 214 lb 6.4 oz (97.3 kg)    Thyroid function tests as follows:     Lab Results  Component Value Date   FREET4 0.92 10/30/2019   FREET4 0.65 09/25/2019   FREET4 0.70 08/14/2019   T3FREE 3.0 08/14/2019   T3FREE 2.9 06/28/2019   T3FREE 3.5 06/08/2019   TSH 0.20 (L) 10/30/2019   TSH 4.83 (H) 09/25/2019   TSH 3.01 08/14/2019    Lab Results  Component  Value Date   THYROTRECAB 4.09 (H) 09/25/2019   THYROTRECAB 5.57 (H) 08/14/2019   THYROTRECAB 3.92 (H) 06/01/2019     Allergies as of 11/20/2019      Reactions   Hydrochlorothiazide Other (See Comments), Hypertension   Wildly fluctuates B/P low-to-high, then high-to-low, in a matter of minutes   Hydrocodone-acetaminophen Nausea And Vomiting   Other Nausea And Vomiting, Other (See Comments)   Stronger pain meds (post-op) caused dehydration and constipation      Medication List       Accurate as of November 20, 2019  9:42 AM. If you have any questions, ask your nurse or doctor.        Aczone 5 % topical gel Generic drug: Dapsone Apply 1 application topically See admin instructions. Apply to the face daily as directed   adapalene 0.1 % gel Commonly known as: DIFFERIN Apply topically at bedtime.   amoxicillin 500 MG capsule Commonly known as: AMOXIL Take 2,000 mg by mouth See admin instructions. Take 2,000 mg by mouth one  hour prior to dental appointments   aspirin EC 325 MG tablet Take 325 mg by mouth in the morning and at bedtime.   diclofenac 50 MG tablet Commonly known as: CATAFLAM Take 50 mg by mouth 2 (two) times daily.   furosemide 20 MG tablet Commonly known as: LASIX Take 1 tablet (20 mg total) by mouth daily.   Linseed Oil Oil Take 1 capsule by mouth daily.   loratadine 10 MG tablet Commonly known as: CLARITIN Take 10 mg by mouth daily.   methimazole 5 MG tablet Commonly known as: TAPAZOLE Take 1 tablet by mouth one daily and 1/2 tablet by mouth the next day, alternating.   OCUVITE LUTEIN 25 PO Take 25 mg by mouth daily.   predniSONE 10 MG tablet Commonly known as: DELTASONE Take 10 mg by mouth daily with breakfast.   selenium 200 MCG Tabs tablet Take by mouth daily.   sulfamethoxazole-trimethoprim 800-160 MG per tablet Commonly known as: BACTRIM DS Take 1 tablet by mouth daily.   tretinoin 0.1 % cream Commonly known as: RETIN-A Apply 1 application topically See admin instructions. Apply to face nightly at bedtime   Vitamin D3 25 MCG (1000 UT) Caps Take 1,000 Units by mouth daily.           Past Medical History:  Diagnosis Date  . 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 Asthma    hx of - PT STATES MILD - NO LONGER REQUIRES INHALERS  . Colitis    mild   . GERD (gastroesophageal reflux disease)   . H/O hiatal hernia   . Hypertension    hx of 10 years ago ; PT'S BLOOD PRESSURE RECENTLY ELEVATED WHILE IN HOSP FOR SURG- NOT ON ANY B/P MEDS  . Thyroid disease     Past Surgical History:  Procedure Laterality Date  . KNEE CLOSED REDUCTION Left 06/05/2014   Procedure: CLOSED MANIPULATION KNEE;  Surgeon: Mauri Pole, MD;  Location: [facility];  Service: Orthopedics;  Laterality: Left;  . RIGHT/LEFT HEART CATH AND CORONARY ANGIOGRAPHY N/A 05/31/2019   Procedure: RIGHT/LEFT HEART CATH AND CORONARY  ANGIOGRAPHY;  Surgeon: Martinique, Peter M, MD;  Location: [place] [facility];  Service: Cardiovascular;  Laterality: N/A;  . TOTAL KNEE ARTHROPLASTY Left 05/07/2014   Procedure: LEFT TOTAL KNEE ARTHROPLASTY;  Surgeon: Mauri Pole, MD;  Location: [facility];  Service: Orthopedics;  Laterality: Left;  . TOTAL KNEE ARTHROPLASTY Right 06/05/2014   Procedure: RIGHT TOTAL KNEE ARTHROPLASTY;  Surgeon: Mauri Pole, MD;  Location: [facility];  Service: Orthopedics;  Laterality: Right;  . WISDOM TEETH EXTRACTIONS      Family History  Problem Relation Age of Onset  . Hypothyroidism Mother   . Graves' disease Sister   . Diabetes Brother   . Diabetes Maternal Aunt     Social History:  reports that she has never smoked. She has never used smokeless tobacco. She reports that she does not drink alcohol or use drugs.  Allergies:  Allergies  Allergen Reactions  . Hydrochlorothiazide Other (See Comments) and Hypertension    Wildly fluctuates B/P low-to-high, then high-to-low, in a matter of minutes  . Hydrocodone-Acetaminophen Nausea And Vomiting  . Other Nausea And Vomiting and Other (See Comments)    Stronger pain meds (post-op) caused dehydration and constipation     Review of Systems  She has been off Lasix 20 mg daily but recently no potassium supplements Also her diclofenac was stopped because of high potassium She is on Bactrim long-term for acne and was told to reduce it to once a day  Lab Results  Component Value Date   K 4.2 10/30/2019   No history of hypertension in the past and was only on metoprolol with history of CHF She thinks her pulse is relatively slow at home and she stopped metoprolol on her own  Home blood pressure readings are being monitored regularly  BP Readings from Last 3 Encounters:  11/20/19 124/82  10/30/19 136/76  09/25/19 136/78       Examination:   BP 124/82 (BP Location: Left Arm, Patient Position: Sitting, Cuff Size: Normal)   Pulse 68   Ht 5\' 1"   (1.549 m)   Wt 209 lb (94.8 kg)   SpO2 98%   BMI 39.49 kg/m   She has swelling of upper and lower eyelids Thyroid not palpable Biceps reflexes are relatively brisk No tremor No facial swelling No pedal edema   Assessment/Plan:   Hyperthyroidism, from Graves' disease with baseline thyrotropin receptor antibody of 3.9  She has been on methimazole since early September 2020   She has had good control of her symptoms Although she was down to 2.5 mg methimazole daily with her TSH trending low she is now alternating 2.5 and 5 mg Subjectively doing well Appears euthyroid and pulses normal even without beta-blockers  We will check her thyroid labs and decide on treatment dosage   Ophthalmopathy: Her symptoms of swelling of the eyes and diplopia, some visual difficulties previously reported are improving significantly with bolus Medrol infusion that she has had one time She is going to follow-up with her ophthalmologist this week  Currently does not appear to have any hyperglycemia related to her high-dose steroid infusion and no significant side effects  History of renal dysfunction and hypertension: To have labs checked today   Elayne Snare 11/20/2019, 9:42 AM    Note: This office note was prepared with Dragon voice recognition system technology. Any transcriptional errors that result from this process are unintentional.

## 2019-11-21 DIAGNOSIS — E0500 Thyrotoxicosis with diffuse goiter without thyrotoxic crisis or storm: Secondary | ICD-10-CM | POA: Diagnosis not present

## 2019-11-22 ENCOUNTER — Other Ambulatory Visit: Payer: Self-pay | Admitting: Ophthalmology

## 2019-11-22 DIAGNOSIS — E0500 Thyrotoxicosis with diffuse goiter without thyrotoxic crisis or storm: Secondary | ICD-10-CM

## 2019-11-22 DIAGNOSIS — H532 Diplopia: Secondary | ICD-10-CM | POA: Diagnosis not present

## 2019-11-22 DIAGNOSIS — H04129 Dry eye syndrome of unspecified lacrimal gland: Secondary | ICD-10-CM | POA: Diagnosis not present

## 2019-11-22 DIAGNOSIS — H40053 Ocular hypertension, bilateral: Secondary | ICD-10-CM | POA: Diagnosis not present

## 2019-11-28 DIAGNOSIS — E0500 Thyrotoxicosis with diffuse goiter without thyrotoxic crisis or storm: Secondary | ICD-10-CM | POA: Diagnosis not present

## 2019-11-29 ENCOUNTER — Other Ambulatory Visit: Payer: Self-pay | Admitting: Ophthalmology

## 2019-11-29 DIAGNOSIS — E0500 Thyrotoxicosis with diffuse goiter without thyrotoxic crisis or storm: Secondary | ICD-10-CM

## 2019-12-05 DIAGNOSIS — E0500 Thyrotoxicosis with diffuse goiter without thyrotoxic crisis or storm: Secondary | ICD-10-CM | POA: Diagnosis not present

## 2019-12-06 NOTE — Progress Notes (Signed)
Diagnosis: Graves Disease with thyroid eye disease  Patient's main complaints: double vision, eye pain, dry eyes, decreased appetite, joint pain.  She is receiving steroid infusions.  She has received 2 infusions since 10/27/2019.  -She can tell a difference in the back of her eyes with the pressure feeling.  Double vision is also improving, she still has some but it is better.  Mostly now just very close up and it used to be every morning and she can tell it has improved.    -Overall comfort of the eyes: She is using less refresh now as she does feel the dryness has improved.  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  No proptosis and fat around the optic nerves is not effaced.  Dr. Sabino Dick 11/22/2019 -Active TED with moderate severity. -Currently on EUGOGO steroid protocol- 500 mg yesterday.  Doing well, notices improvement in discomfort of eyes and diplopia.  Vision is excellent. -I discussed tepezza with patient.  Christina [name] may be beneficial for this patient to improve diplopia, soft tissue swelling.  This is right now unavailable, but is keyed up if needed. -Due to tepezza not available, will refer to Dr. Lisbeth [name] to consider orbital radiation which helps in Graves disease with TED with diplopia.  Also it may reduce risk of DON.    Pain on a scale of 0-10 is: 0   SAFETY ISSUES:  Prior radiation? no  Pacemaker/ICD?  no  Possible current pregnancy? Postmenopausal  Is the patient on methotrexate? No  Additional Complaints / other details: Patient is doing well denies any issues is not sure she is ready for Radiation.

## 2019-12-07 ENCOUNTER — Ambulatory Visit
Admission: RE | Admit: 2019-12-07 | Discharge: 2019-12-07 | Disposition: A | Discharge status: Home / Self Care | Payer: Medicare Other | Source: Ambulatory Visit | Attending: Radiation Oncology | Admitting: Radiation Oncology

## 2019-12-07 ENCOUNTER — Telehealth: Payer: Self-pay

## 2019-12-07 ENCOUNTER — Encounter: Payer: Self-pay | Admitting: Radiation Oncology

## 2019-12-07 ENCOUNTER — Ambulatory Visit: Payer: Medicare Other

## 2019-12-07 DIAGNOSIS — E0500 Thyrotoxicosis with diffuse goiter without thyrotoxic crisis or storm: Secondary | ICD-10-CM

## 2019-12-07 DIAGNOSIS — H5789 Other specified disorders of eye and adnexa: Secondary | ICD-10-CM

## 2019-12-07 NOTE — Telephone Encounter (Signed)
Called to let patient know call would be late

## 2019-12-09 ENCOUNTER — Other Ambulatory Visit: Payer: Self-pay

## 2019-12-09 DIAGNOSIS — H5789 Other specified disorders of eye and adnexa: Secondary | ICD-10-CM | POA: Insufficient documentation

## 2019-12-09 DIAGNOSIS — E0500 Thyrotoxicosis with diffuse goiter without thyrotoxic crisis or storm: Secondary | ICD-10-CM | POA: Insufficient documentation

## 2019-12-09 NOTE — Progress Notes (Signed)
Radiation Oncology         [phone] ________________________________  Initial Outpatient Consultation - Conducted via telephone due to current COVID-19 concerns for limiting patient exposure  I spoke with the patient to conduct this consult visit via telephone to spare the patient unnecessary potential exposure in the healthcare setting during the current COVID-19 pandemic. The patient was notified in advance and was offered a [place] meeting to allow for face to face communication but unfortunately reported that they did not have the appropriate resources/technology to support such a visit and instead preferred to proceed with a telephone consult.    Name: Christina [name]        MRN: [id number]  Date of Service: 12/07/2019 DOB: 1950/10/17  [id number], [name], MD  Ernestina Columbia, MD     REFERRING PHYSICIAN: Ernestina Columbia, MD   DIAGNOSIS: The encounter diagnosis was Thyroid eye disease.   HISTORY OF PRESENT ILLNESS: Christina [name] is a 69 y.o. female seen at the request of Dr. Sarajane Marek for a diagnosis of thyroid eye disease. The patient has hyperthyroidism and takes methimazole and due to her disease, developed bilateral proptosis and was quite symptomatic with diplopia. She began oral prednisone for this several months ago and reports significant improvement. She also is awaiting Tepezza options, a systemic infusion to treat this condition as well. She's contacted today to discuss options of radiotherapy as another treatment modality.    PREVIOUS RADIATION THERAPY: No   PAST MEDICAL HISTORY:  Past Medical History:  Diagnosis Date  . 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 Asthma    hx of - PT STATES MILD - NO LONGER REQUIRES INHALERS  . Colitis    mild   . GERD (gastroesophageal reflux disease)   . H/O hiatal hernia   . Hypertension    hx of 10 years ago ; PT'S BLOOD PRESSURE RECENTLY ELEVATED WHILE IN  HOSP FOR SURG- NOT ON ANY B/P MEDS  . Thyroid disease        PAST SURGICAL HISTORY: Past Surgical History:  Procedure Laterality Date  . KNEE CLOSED REDUCTION Left 06/05/2014   Procedure: CLOSED MANIPULATION KNEE;  Surgeon: Mauri Pole, MD;  Location: [facility];  Service: Orthopedics;  Laterality: Left;  . RIGHT/LEFT HEART CATH AND CORONARY ANGIOGRAPHY N/A 05/31/2019   Procedure: RIGHT/LEFT HEART CATH AND CORONARY ANGIOGRAPHY;  Surgeon: Martinique, Peter M, MD;  Location: [place] [facility];  Service: Cardiovascular;  Laterality: N/A;  . TOTAL KNEE ARTHROPLASTY Left 05/07/2014   Procedure: LEFT TOTAL KNEE ARTHROPLASTY;  Surgeon: Mauri Pole, MD;  Location: [facility];  Service: Orthopedics;  Laterality: Left;  . TOTAL KNEE ARTHROPLASTY Right 06/05/2014   Procedure: RIGHT TOTAL KNEE ARTHROPLASTY;  Surgeon: Mauri Pole, MD;  Location: [facility];  Service: Orthopedics;  Laterality: Right;  . WISDOM TEETH EXTRACTIONS       FAMILY HISTORY:  Family History  Problem Relation Age of Onset  . Hypothyroidism Mother   . Graves' disease Sister   . Diabetes Brother   . Diabetes Maternal Aunt      SOCIAL HISTORY:  reports that she has never smoked. She has never used smokeless tobacco. She reports that she does not drink alcohol or use drugs.  The patient is single and lives in [place].  ALLERGIES: Hydrochlorothiazide, Hydrocodone-acetaminophen, and Other   MEDICATIONS:  Current Outpatient Medications  Medication Sig Dispense  Refill  . adapalene (DIFFERIN) 0.1 % gel Apply topically at bedtime.    Marland Kitchen aspirin EC 325 MG tablet Take 325 mg by mouth in the morning and at bedtime.    . Dapsone (ACZONE) 5 % topical gel Apply 1 application topically See admin instructions. Apply to the face daily as directed    . Flaxseed Oil (LINSEED OIL) OIL Take 1 capsule by mouth daily.     Marland Kitchen loratadine (CLARITIN) 10 MG tablet Take 10 mg by mouth daily.    . Lutein-Zeaxanthin (OCUVITE LUTEIN 25 PO) Take 25 mg by mouth  daily.    . methimazole (TAPAZOLE) 5 MG tablet Take 1 tablet by mouth one daily and 1/2 tablet by mouth the next day, alternating. 30 tablet 2  . selenium 200 MCG TABS tablet Take by mouth daily.    Marland Kitchen sulfamethoxazole-trimethoprim (BACTRIM DS) 800-160 MG per tablet Take 1 tablet by mouth daily.     Marland Kitchen tretinoin (RETIN-A) 0.1 % cream Apply 1 application topically See admin instructions. Apply to face nightly at bedtime    . amoxicillin (AMOXIL) 500 MG capsule Take 2,000 mg by mouth See admin instructions. Take 2,000 mg by mouth one hour prior to dental appointments    . furosemide (LASIX) 20 MG tablet Take 1 tablet (20 mg total) by mouth daily. 90 tablet 3   No current facility-administered medications for this encounter.     REVIEW OF SYSTEMS: On review of systems, the patient reports that she is doing well overall. She reports her vision has improved with steroids. She had previously had a very difficult time with diplopia and at this time only notes it when putting in eye drops for dry eye. She denies any chest pain, shortness of breath, cough, fevers, chills, night sweats, unintended weight changes. She denies any bowel or bladder disturbances, and denies abdominal pain, nausea or vomiting. She denies any new musculoskeletal or joint aches or pains. A complete review of systems is obtained and is otherwise negative.     PHYSICAL EXAM:  Unable to assess.   ECOG = 1  0 - Asymptomatic (Fully active, able to carry on all predisease activities without restriction)  1 - Symptomatic but completely ambulatory (Restricted in physically strenuous activity but ambulatory and able to carry out work of a light or sedentary nature. For example, light housework, office work)  2 - Symptomatic, <50% in bed during the day (Ambulatory and capable of all self care but unable to carry out any work activities. Up and about more than 50% of waking hours)  3 - Symptomatic, >50% in bed, but not bedbound  (Capable of only limited self-care, confined to bed or chair 50% or more of waking hours)  4 - Bedbound (Completely disabled. Cannot carry on any self-care. Totally confined to bed or chair)  5 - Death   Eustace Pen MM, Creech RH, Tormey DC, et al. [phone]). "Toxicity and response criteria of the [facility]". [place] Oncol. 5 (6): 649-55    LABORATORY DATA:  Lab Results  Component Value Date   WBC 9.5 09/25/2019   HGB 13.6 09/25/2019   HCT 41.5 09/25/2019   MCV 92.9 09/25/2019   PLT 316.0 09/25/2019   Lab Results  Component Value Date   NA 139 11/20/2019   K 4.5 11/20/2019   CL 104 11/20/2019   CO2 29 11/20/2019   Lab Results  Component Value Date   ALT 15 06/08/2019   AST 20 05/30/2019  ALKPHOS 102 05/30/2019   BILITOT 0.3 05/30/2019      RADIOGRAPHY: CT ORBITS WO CONTRAST  Result Date: 11/11/2019 CLINICAL DATA:  Graves disease.  Double vision at the orbital apex. EXAM: CT ORBITS WITHOUT CONTRAST TECHNIQUE: Multidetector CT images were obtained using the standard protocol without intravenous contrast. COMPARISON:  None available FINDINGS: Orbits: Extraocular muscle thickening especially involving the medial and inferior recti, symmetric. The maximum thickness is 6 mm at the left inferior rectus belly. There is crowding at the orbital apex but no effacement of fat around the optic nerve sheath complexes. No superimposed mass. No proptosis. Visualized sinuses: Clear Soft tissues: Negative for soft tissue mass or inflammation Limited intracranial: Atherosclerotic calcification. 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 No proptosis and fat around the optic nerves is not effaced. Electronically Signed   By: Monte Fantasia M.D.   On: 11/11/2019 10:05       IMPRESSION/PLAN: 1. Thyroid Eye Disease. Dr. Lisbeth Renshaw discusses the pathology findings and reviews the nature of thyroid eye disease and the options of  treatment. Dr. Lisbeth Renshaw discusses that there are multiple approaches to treating the global hypertrophy and while she has responded nicely to oral steroids, she may be a good candidate for radiotherapy. Therapy could reduce the risks of recurrence, and protect her from possible loss of vision. She is hoping for an immunomodulating therapy rather, and would like to follow up with Dr. Sabino Dick. We discussed the risks, benefits, short, and long term effects of radiotherapy, as well as the delivery and logistics of radiotherapy for a 10 fraction course to the globes. At this time she is not interested in proceeding, and I will reach out to Dr. Sabino Dick to make sure he's aware. She is in agreement with this plan to be available though if her clinical course were to change, or if she decided she would like to revisit the conversation.   Given current concerns for patient exposure during the COVID-19 pandemic, this encounter was conducted via telephone.  The patient has provided two factor identification and has given verbal consent for this type of encounter and has been advised to only accept a meeting of this type in a secure network environment. The time spent during this encounter was 45 minutes including preparation, discussion, and coordination of the patient's care. The attendants for this meeting include Dr. Lisbeth Renshaw, Hayden Pedro  and Gabrielle Dare.  During the encounter,  Dr. Lisbeth Renshaw, and Hayden Pedro were located at [facility] Radiation Oncology Department.  Christina [name] was located at home.   The above documentation reflects my direct findings during this shared patient visit. Please see the separate note by Dr. Lisbeth Renshaw on this date for the remainder of the patient's plan of care.    Carola Rhine, PAC

## 2019-12-11 ENCOUNTER — Ambulatory Visit: Payer: PRIVATE HEALTH INSURANCE | Admitting: Endocrinology

## 2019-12-12 ENCOUNTER — Encounter: Payer: Self-pay | Admitting: Radiology

## 2019-12-12 DIAGNOSIS — E0500 Thyrotoxicosis with diffuse goiter without thyrotoxic crisis or storm: Secondary | ICD-10-CM | POA: Diagnosis not present

## 2019-12-12 NOTE — Progress Notes (Signed)
08/31/19 received cancellation notification from [employer]. Patient experienced challenges with the device and declined further assistance or alternate device. Monitor was returned on 09/04/19 unused.

## 2019-12-13 ENCOUNTER — Ambulatory Visit: Payer: PRIVATE HEALTH INSURANCE | Admitting: Endocrinology

## 2019-12-19 DIAGNOSIS — E0500 Thyrotoxicosis with diffuse goiter without thyrotoxic crisis or storm: Secondary | ICD-10-CM | POA: Diagnosis not present

## 2019-12-26 DIAGNOSIS — E0500 Thyrotoxicosis with diffuse goiter without thyrotoxic crisis or storm: Secondary | ICD-10-CM | POA: Diagnosis not present

## 2019-12-27 DIAGNOSIS — Z09 Encounter for follow-up examination after completed treatment for conditions other than malignant neoplasm: Secondary | ICD-10-CM | POA: Diagnosis not present

## 2019-12-27 DIAGNOSIS — E0500 Thyrotoxicosis with diffuse goiter without thyrotoxic crisis or storm: Secondary | ICD-10-CM | POA: Diagnosis not present

## 2019-12-27 DIAGNOSIS — H532 Diplopia: Secondary | ICD-10-CM | POA: Diagnosis not present

## 2019-12-27 DIAGNOSIS — H40053 Ocular hypertension, bilateral: Secondary | ICD-10-CM | POA: Diagnosis not present

## 2019-12-27 DIAGNOSIS — H04129 Dry eye syndrome of unspecified lacrimal gland: Secondary | ICD-10-CM | POA: Diagnosis not present

## 2020-01-02 DIAGNOSIS — E0500 Thyrotoxicosis with diffuse goiter without thyrotoxic crisis or storm: Secondary | ICD-10-CM | POA: Diagnosis not present

## 2020-01-03 ENCOUNTER — Other Ambulatory Visit: Payer: Self-pay

## 2020-01-04 ENCOUNTER — Ambulatory Visit (INDEPENDENT_AMBULATORY_CARE_PROVIDER_SITE_OTHER): Payer: Medicare Other | Admitting: Endocrinology

## 2020-01-04 ENCOUNTER — Encounter: Payer: Self-pay | Admitting: Endocrinology

## 2020-01-04 VITALS — BP 134/80 | HR 91 | Ht 61.0 in | Wt 214.0 lb

## 2020-01-04 DIAGNOSIS — R739 Hyperglycemia, unspecified: Secondary | ICD-10-CM

## 2020-01-04 DIAGNOSIS — T380X5A Adverse effect of glucocorticoids and synthetic analogues, initial encounter: Secondary | ICD-10-CM

## 2020-01-04 DIAGNOSIS — H40013 Open angle with borderline findings, low risk, bilateral: Secondary | ICD-10-CM | POA: Diagnosis not present

## 2020-01-04 DIAGNOSIS — E0590 Thyrotoxicosis, unspecified without thyrotoxic crisis or storm: Secondary | ICD-10-CM

## 2020-01-04 DIAGNOSIS — E0500 Thyrotoxicosis with diffuse goiter without thyrotoxic crisis or storm: Secondary | ICD-10-CM

## 2020-01-04 DIAGNOSIS — H40053 Ocular hypertension, bilateral: Secondary | ICD-10-CM | POA: Diagnosis not present

## 2020-01-04 DIAGNOSIS — H532 Diplopia: Secondary | ICD-10-CM | POA: Diagnosis not present

## 2020-01-04 NOTE — Progress Notes (Signed)
Patient ID: Christina [name], female   DOB: September 02, 1951, 69 y.o.   MRN: [id number]                                                                                                               Reason for Appointment:  Hyperthyroidism, follow-up visit   Chief complaint: Recheck thyroid   History of Present Illness:   Prior history: For the last several years the patient has had symptoms of occasional palpitations, shortness of breath on exertion, shakiness and feeling somewhat warm.  However she did not have any recent worsening of the symptoms except on the morning of hospitalization She previously did not have any unusual weight loss, anxiety or fatigue She was also having some swelling of her legs prior to admission Since she was having difficulties with tachycardia and atrial fibrillation her thyroid level was checked in the hospital and indicated hyperthyroidism  RECENT HISTORY:  Baseline symptoms before treatment were occasional palpitations, shortness of breath on exertion, shakiness and feeling warm She has Graves' disease confirmed with a baseline thyrotropin receptor antibody level of 3.9  She has been treated with methimazole She had been on gradually decreasing doses of methimazole With her TSH being low normal in 10/2019 her dose was increased back to 5 mg alternating with 2.5 mg  She is continuing the same dose  She feels a little jerky inside but no unusual anxiety, palpitations or shakiness Has tendency to cold intolerance as before Has been gaining weight with prednisone Her dose was not changed on her last visit when her thyroid levels were fairly normal  Last thyrotropin receptor antibody in 1/21 was still high at 4.1  Labs are pending  Wt Readings from Last 3 Encounters:  01/04/20 214 lb (97.1 kg)  11/20/19 209 lb (94.8 kg)  10/30/19 206 lb 6.4 oz (93.6 kg)    Thyroid function tests as follows:     Lab Results  Component Value Date   FREET4 0.82 11/20/2019    FREET4 0.92 10/30/2019   FREET4 0.65 09/25/2019   T3FREE 2.9 11/20/2019   T3FREE 3.0 08/14/2019   T3FREE 2.9 06/28/2019   TSH 0.64 11/20/2019   TSH 0.20 (L) 10/30/2019   TSH 4.83 (H) 09/25/2019    Lab Results  Component Value Date   THYROTRECAB 4.09 (H) 09/25/2019   THYROTRECAB 5.57 (H) 08/14/2019   THYROTRECAB 3.92 (H) 06/01/2019   OPHTHALMOPATHY:  Since about 11/20 she was having more swelling of her eyes, some double vision, and a pressure sensation Subsequently in December she had more fuzzy vision, feeling of fullness in her eyes and continued double vision She went to the urgent care center and was given a Medrol Dosepak  With the recurrence of the symptoms in 1/21 she was again started on tapering dose of prednisone After consultation with oculoplastic ophthalmologist she has been on Medrol high-dose weekly injections started in late February She is now getting only 250 mg instead of 500 Apparently is going to be scheduled for Netty Starring which is now  available  She feels some pressure in the eyes Has had decreasing diplopia but still not completely improved No further decrease in her external eye swelling, she still has dry eyes She thinks her vision is blurred but has had extensive otological evaluation  CT scan of orbits has not shown any proptosis and only thickening of the eye muscles    Allergies as of 01/04/2020      Reactions   Hydrochlorothiazide Other (See Comments), Hypertension   Wildly fluctuates B/P low-to-high, then high-to-low, in a matter of minutes   Hydrocodone-acetaminophen Nausea And Vomiting   Other Nausea And Vomiting, Other (See Comments)   Stronger pain meds (post-op) caused dehydration and constipation      Medication List       Accurate as of January 04, 2020  4:03 PM. If you have any questions, ask your nurse or doctor.        STOP taking these medications   tretinoin 0.1 % cream Commonly known as: RETIN-A Stopped by: Elayne Snare, MD      TAKE these medications   Aczone 5 % topical gel Generic drug: Dapsone Apply 1 application topically See admin instructions. Apply to the face daily as directed   adapalene 0.1 % gel Commonly known as: DIFFERIN Apply topically at bedtime.   amoxicillin 500 MG capsule Commonly known as: AMOXIL Take 2,000 mg by mouth See admin instructions. Take 2,000 mg by mouth one hour prior to dental appointments   aspirin EC 325 MG tablet Take 325 mg by mouth in the morning and at bedtime.   furosemide 20 MG tablet Commonly known as: LASIX Take 1 tablet (20 mg total) by mouth daily.   Linseed Oil Oil Take 1 capsule by mouth daily.   loratadine 10 MG tablet Commonly known as: CLARITIN Take 10 mg by mouth daily.   methimazole 5 MG tablet Commonly known as: TAPAZOLE Take 1 tablet by mouth one daily and 1/2 tablet by mouth the next day, alternating.   OCUVITE LUTEIN 25 PO Take 25 mg by mouth daily.   selenium 200 MCG Tabs tablet Take by mouth daily.   sulfamethoxazole-trimethoprim 800-160 MG per tablet Commonly known as: BACTRIM DS Take 1 tablet by mouth daily.           Past Medical History:  Diagnosis Date  . 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 Asthma    hx of - PT STATES MILD - NO LONGER REQUIRES INHALERS  . Colitis    mild   . GERD (gastroesophageal reflux disease)   . H/O hiatal hernia   . Hypertension    hx of 10 years ago ; PT'S BLOOD PRESSURE RECENTLY ELEVATED WHILE IN HOSP FOR SURG- NOT ON ANY B/P MEDS  . Thyroid disease     Past Surgical History:  Procedure Laterality Date  . KNEE CLOSED REDUCTION Left 06/05/2014   Procedure: CLOSED MANIPULATION KNEE;  Surgeon: Mauri Pole, MD;  Location: [facility];  Service: Orthopedics;  Laterality: Left;  . RIGHT/LEFT HEART CATH AND CORONARY ANGIOGRAPHY N/A 05/31/2019   Procedure: RIGHT/LEFT HEART CATH AND CORONARY ANGIOGRAPHY;  Surgeon: Martinique, Peter  M, MD;  Location: [place] [facility];  Service: Cardiovascular;  Laterality: N/A;  . TOTAL KNEE ARTHROPLASTY Left 05/07/2014   Procedure: LEFT TOTAL KNEE ARTHROPLASTY;  Surgeon: Mauri Pole, MD;  Location: [facility];  Service: Orthopedics;  Laterality: Left;  . TOTAL  KNEE ARTHROPLASTY Right 06/05/2014   Procedure: RIGHT TOTAL KNEE ARTHROPLASTY;  Surgeon: Mauri Pole, MD;  Location: [facility];  Service: Orthopedics;  Laterality: Right;  . WISDOM TEETH EXTRACTIONS      Family History  Problem Relation Age of Onset  . Hypothyroidism Mother   . Graves' disease Sister   . Diabetes Brother   . Diabetes Maternal Aunt     Social History:  reports that she has never smoked. She has never used smokeless tobacco. She reports that she does not drink alcohol or use drugs.  Allergies:  Allergies  Allergen Reactions  . Hydrochlorothiazide Other (See Comments) and Hypertension    Wildly fluctuates B/P low-to-high, then high-to-low, in a matter of minutes  . Hydrocodone-Acetaminophen Nausea And Vomiting  . Other Nausea And Vomiting and Other (See Comments)    Stronger pain meds (post-op) caused dehydration and constipation     Review of Systems  Hyperkalemia: Previously diclofenac was stopped because of high potassium She is on Bactrim long-term for acne and was told to reduce it to once a day  Lab Results  Component Value Date   K 4.5 11/20/2019   No history of hypertension in the past and was only on metoprolol with history of CHF, not on metoprolol currently   Home blood pressure readings are being monitored regularly  BP Readings from Last 3 Encounters:  01/04/20 134/80  11/20/19 124/82  10/30/19 136/76       Examination:   BP 134/80 (BP Location: Left Arm, Patient Position: Sitting, Cuff Size: Large)   Pulse 91   Ht 5\' 1"  (1.549 m)   Wt 214 lb (97.1 kg)   SpO2 98%   BMI 40.43 kg/m   Repeat pulse 72 regular She has swelling of upper and lower eyelids but not the face  Thyroid not palpable Biceps reflexes are brisk No tremor Skin appears normal   Assessment/Plan:   Hyperthyroidism, from Graves' disease with baseline thyrotropin receptor antibody of 3.9  She has been on methimazole since early September 2020   She has had good control of her symptoms For methimazole she is now alternating 2.5 and 5 mg and has been unable to taper the dose down in the last couple of visits  Subjectively doing well, not clear if she is having some anxiety Looks euthyroid  We will check her thyroid labs and decide on methimazole dosage   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 Any transcriptional errors that result from this process are unintentional.

## 2020-01-05 LAB — TSH: TSH: 2.22 u[IU]/mL (ref 0.35–4.50)

## 2020-01-05 LAB — T4, FREE: Free T4: 0.75 ng/dL (ref 0.60–1.60)

## 2020-01-05 LAB — THYROTROPIN RECEPTOR AUTOABS: Thyrotropin Receptor Ab: 2.84 IU/L — ABNORMAL HIGH (ref 0.00–1.75)

## 2020-01-05 LAB — T3, FREE: T3, Free: 2.8 pg/mL (ref 2.3–4.2)

## 2020-01-09 DIAGNOSIS — E0500 Thyrotoxicosis with diffuse goiter without thyrotoxic crisis or storm: Secondary | ICD-10-CM | POA: Diagnosis not present

## 2020-01-15 DIAGNOSIS — E0500 Thyrotoxicosis with diffuse goiter without thyrotoxic crisis or storm: Secondary | ICD-10-CM | POA: Diagnosis not present

## 2020-01-31 DIAGNOSIS — E0500 Thyrotoxicosis with diffuse goiter without thyrotoxic crisis or storm: Secondary | ICD-10-CM | POA: Diagnosis not present

## 2020-02-07 DIAGNOSIS — I10 Essential (primary) hypertension: Secondary | ICD-10-CM | POA: Diagnosis not present

## 2020-02-07 DIAGNOSIS — E0500 Thyrotoxicosis with diffuse goiter without thyrotoxic crisis or storm: Secondary | ICD-10-CM | POA: Diagnosis not present

## 2020-02-07 DIAGNOSIS — I4891 Unspecified atrial fibrillation: Secondary | ICD-10-CM | POA: Diagnosis not present

## 2020-02-07 DIAGNOSIS — Z0000 Encounter for general adult medical examination without abnormal findings: Secondary | ICD-10-CM | POA: Diagnosis not present

## 2020-02-08 DIAGNOSIS — Z09 Encounter for follow-up examination after completed treatment for conditions other than malignant neoplasm: Secondary | ICD-10-CM | POA: Diagnosis not present

## 2020-02-08 DIAGNOSIS — H532 Diplopia: Secondary | ICD-10-CM | POA: Diagnosis not present

## 2020-02-08 DIAGNOSIS — H04129 Dry eye syndrome of unspecified lacrimal gland: Secondary | ICD-10-CM | POA: Diagnosis not present

## 2020-02-08 DIAGNOSIS — H40053 Ocular hypertension, bilateral: Secondary | ICD-10-CM | POA: Diagnosis not present

## 2020-02-08 DIAGNOSIS — E0500 Thyrotoxicosis with diffuse goiter without thyrotoxic crisis or storm: Secondary | ICD-10-CM | POA: Diagnosis not present

## 2020-02-15 ENCOUNTER — Other Ambulatory Visit: Payer: Self-pay

## 2020-02-16 ENCOUNTER — Encounter: Payer: Self-pay | Admitting: Endocrinology

## 2020-02-16 ENCOUNTER — Ambulatory Visit (INDEPENDENT_AMBULATORY_CARE_PROVIDER_SITE_OTHER): Payer: Medicare Other | Admitting: Endocrinology

## 2020-02-16 VITALS — BP 154/92 | HR 95 | Ht 61.0 in | Wt 210.8 lb

## 2020-02-16 DIAGNOSIS — E0590 Thyrotoxicosis, unspecified without thyrotoxic crisis or storm: Secondary | ICD-10-CM

## 2020-02-16 NOTE — Progress Notes (Signed)
Patient ID: Christina [name], female   DOB: 1951-05-30, 69 y.o.   MRN: [id number]                                                                                                               Reason for Appointment:  Hyperthyroidism, follow-up visit   Chief complaint: Follow-up of thyroid   History of Present Illness:   Prior history: For the last several years the patient has had symptoms of occasional palpitations, shortness of breath on exertion, shakiness and feeling somewhat warm.  However she did not have any recent worsening of the symptoms except on the morning of hospitalization She previously did not have any unusual weight loss, anxiety or fatigue She was also having some swelling of her legs prior to admission Since she was having difficulties with tachycardia and atrial fibrillation her thyroid level was checked in the hospital and indicated hyperthyroidism  RECENT HISTORY:  Baseline symptoms before treatment were occasional palpitations, shortness of breath on exertion, shakiness and feeling warm She has Graves' disease confirmed with a baseline thyrotropin receptor antibody level of 3.9  She has been treated with methimazole She had been on relatively small doses of methimazole With her TSH being low normal in 10/2019 her dose was increased back to 5 mg alternating with 2.5 mg This was continued in 3/21  She says that for the last month at least she has had more fatigue, sleepiness and some weakness No heat intolerance and she may be feeling a little colder than usual Her weight is down 4 pounds however Currently not on prednisone  Last thyrotropin receptor antibody in 12/2019 was slightly better at 2.8, previously was high at 4.1  Labs are pending from today  Wt Readings from Last 3 Encounters:  02/16/20 210 lb 12.8 oz (95.6 kg)  01/04/20 214 lb (97.1 kg)  11/20/19 209 lb (94.8 kg)    Thyroid function tests as follows:     Lab Results  Component Value Date    FREET4 0.75 01/04/2020   FREET4 0.82 11/20/2019   FREET4 0.92 10/30/2019   T3FREE 2.8 01/04/2020   T3FREE 2.9 11/20/2019   T3FREE 3.0 08/14/2019   TSH 2.22 01/04/2020   TSH 0.64 11/20/2019   TSH 0.20 (L) 10/30/2019    Lab Results  Component Value Date   THYROTRECAB 2.84 (H) 01/04/2020   THYROTRECAB 4.09 (H) 09/25/2019   THYROTRECAB 5.57 (H) 08/14/2019   OPHTHALMOPATHY:  Since about 11/20 she was having more swelling of her eyes, some double vision, and a pressure sensation Subsequently in December she had more fuzzy vision, feeling of fullness in her eyes and continued double vision She went to the urgent care center and was given a Medrol Dosepak  With the recurrence of the symptoms in 1/21 she was again started on tapering dose of prednisone Being treated by oculoplastic ophthalmologist and she had been on Medrol high-dose weekly injections started in late February She is now not taking any steroids  She has finally been able to get  Netty Starring and has had 2 injections  She feels less pressure in the eyes and slight improvement in swelling Diplopia and vision are improved also She thinks on her last exam her proptosis was reduced  CT scan of orbits has not shown any proptosis and only thickening of the eye muscles    Allergies as of 02/16/2020      Reactions   Hydrochlorothiazide Other (See Comments), Hypertension   Wildly fluctuates B/P low-to-high, then high-to-low, in a matter of minutes   Hydrocodone-acetaminophen Nausea And Vomiting   Other Nausea And Vomiting, Other (See Comments)   Stronger pain meds (post-op) caused dehydration and constipation      Medication List       Accurate as of Feb 16, 2020  3:12 PM. If you have any questions, ask your nurse or doctor.        STOP taking these medications   furosemide 20 MG tablet Commonly known as: LASIX Stopped by: Elayne Snare, MD     TAKE these medications   Aczone 5 % topical gel Generic drug: Dapsone  Apply 1 application topically See admin instructions. Apply to the face daily as directed   adapalene 0.1 % gel Commonly known as: DIFFERIN Apply topically at bedtime.   amLODipine-benazepril 5-20 MG capsule Commonly known as: LOTREL Take 0.5 capsules by mouth daily. Take 1/2 tablet (2.5-10mg  total) by mouth once daily.   amoxicillin 500 MG capsule Commonly known as: AMOXIL Take 2,000 mg by mouth See admin instructions. Take 2,000 mg by mouth one hour prior to dental appointments   aspirin EC 325 MG tablet Take 325 mg by mouth daily.   Linseed Oil Oil Take 1 capsule by mouth daily.   loratadine 10 MG tablet Commonly known as: CLARITIN Take 10 mg by mouth daily.   methimazole 5 MG tablet Commonly known as: TAPAZOLE Take 1 tablet by mouth one daily and 1/2 tablet by mouth the next day, alternating.   OCUVITE LUTEIN 25 PO Take 25 mg by mouth daily.   selenium 200 MCG Tabs tablet Take by mouth daily.   sulfamethoxazole-trimethoprim 800-160 MG per tablet Commonly known as: BACTRIM DS Take 1 tablet by mouth daily.           Past Medical History:  Diagnosis Date  . 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 Asthma    hx of - PT STATES MILD - NO LONGER REQUIRES INHALERS  . Colitis    mild   . GERD (gastroesophageal reflux disease)   . H/O hiatal hernia   . Hypertension    hx of 10 years ago ; PT'S BLOOD PRESSURE RECENTLY ELEVATED WHILE IN HOSP FOR SURG- NOT ON ANY B/P MEDS  . Thyroid disease     Past Surgical History:  Procedure Laterality Date  . KNEE CLOSED REDUCTION Left 06/05/2014   Procedure: CLOSED MANIPULATION KNEE;  Surgeon: Mauri Pole, MD;  Location: [facility];  Service: Orthopedics;  Laterality: Left;  . RIGHT/LEFT HEART CATH AND CORONARY ANGIOGRAPHY N/A 05/31/2019   Procedure: RIGHT/LEFT HEART CATH AND CORONARY ANGIOGRAPHY;  Surgeon: Martinique, Peter M, MD;  Location: [place] [facility];   Service: Cardiovascular;  Laterality: N/A;  . TOTAL KNEE ARTHROPLASTY Left 05/07/2014   Procedure: LEFT TOTAL KNEE ARTHROPLASTY;  Surgeon: Mauri Pole, MD;  Location: [facility];  Service: Orthopedics;  Laterality: Left;  . TOTAL KNEE ARTHROPLASTY Right 06/05/2014   Procedure:  RIGHT TOTAL KNEE ARTHROPLASTY;  Surgeon: Mauri Pole, MD;  Location: [facility];  Service: Orthopedics;  Laterality: Right;  . WISDOM TEETH EXTRACTIONS      Family History  Problem Relation Age of Onset  . Hypothyroidism Mother   . Graves' disease Sister   . Diabetes Brother   . Diabetes Maternal Aunt     Social History:  reports that she has never smoked. She has never used smokeless tobacco. She reports that she does not drink alcohol or use drugs.  Allergies:  Allergies  Allergen Reactions  . Hydrochlorothiazide Other (See Comments) and Hypertension    Wildly fluctuates B/P low-to-high, then high-to-low, in a matter of minutes  . Hydrocodone-Acetaminophen Nausea And Vomiting  . Other Nausea And Vomiting and Other (See Comments)    Stronger pain meds (post-op) caused dehydration and constipation     Review of Systems  No history of hypertension in the past but more recently has had much higher blood pressure readings, as high as [id number] systolic Her PCP is started on [place] blood pressure readings are being monitored regularly Last reading at home 142/87  BP Readings from Last 3 Encounters:  02/16/20 (!) 154/92  01/04/20 134/80  11/20/19 124/82       Examination:   BP (!) 154/92 (BP Location: Left Arm, Patient Position: Sitting, Cuff Size: Large)   Pulse 95   Ht 5\' 1"  (1.549 m)   Wt 210 lb 12.8 oz (95.6 kg)   SpO2 98%   BMI 39.83 kg/m   Repeat pulse 80 She has swelling of mostly lower eyelids but not the face Thyroid not palpable Biceps reflexes are slightly brisk  Skin appears normal   Assessment/Plan:   Hyperthyroidism, from Graves' disease with baseline thyrotropin receptor  antibody of 3.9  She has been on methimazole since early September 2020   Currently with methimazole she is alternating 2.5 and 5 mg   She thinks she has been more tired and weak on the last month or so, her weight has gone down possibly from being off steroids On exam there is no abnormal findings However with her symptoms may have become hypothyroid now  We will check her thyroid labs and decide on methimazole dosage   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 Any transcriptional errors that result from this process are unintentional.

## 2020-02-17 LAB — T4, FREE: Free T4: 1.02 ng/dL (ref 0.82–1.77)

## 2020-02-17 LAB — TSH: TSH: 2.68 u[IU]/mL (ref 0.450–4.500)

## 2020-03-25 ENCOUNTER — Other Ambulatory Visit: Payer: Self-pay | Admitting: Endocrinology

## 2020-03-28 ENCOUNTER — Ambulatory Visit: Payer: Medicare Other | Admitting: Endocrinology

## 2020-04-02 DIAGNOSIS — E0500 Thyrotoxicosis with diffuse goiter without thyrotoxic crisis or storm: Secondary | ICD-10-CM | POA: Diagnosis not present

## 2020-04-15 ENCOUNTER — Ambulatory Visit (INDEPENDENT_AMBULATORY_CARE_PROVIDER_SITE_OTHER): Payer: Medicare Other | Admitting: Endocrinology

## 2020-04-15 ENCOUNTER — Other Ambulatory Visit: Payer: Self-pay

## 2020-04-15 ENCOUNTER — Encounter: Payer: Self-pay | Admitting: Endocrinology

## 2020-04-15 VITALS — BP 142/82 | HR 88 | Ht 61.0 in | Wt 212.6 lb

## 2020-04-15 DIAGNOSIS — E0590 Thyrotoxicosis, unspecified without thyrotoxic crisis or storm: Secondary | ICD-10-CM | POA: Diagnosis not present

## 2020-04-15 NOTE — Progress Notes (Signed)
Patient ID: Christina [name], female   DOB: [birth year], 69 y.o.   MRN: [id number]                                                                                                               Reason for Appointment:  Hyperthyroidism, follow-up visit   Chief complaint: Follow-up of thyroid   History of Present Illness:   Prior history: For the last several years the patient has had symptoms of occasional palpitations, shortness of breath on exertion, shakiness and feeling somewhat warm.  However she did not have any recent worsening of the symptoms except on the morning of hospitalization She previously did not have any unusual weight loss, anxiety or fatigue She was also having some swelling of her legs prior to admission Since she was having difficulties with tachycardia and atrial fibrillation her thyroid level was checked in the hospital and indicated hyperthyroidism  RECENT HISTORY:  Baseline symptoms before treatment were occasional palpitations, shortness of breath on exertion, shakiness and feeling warm She has Graves' disease confirmed with a baseline thyrotropin receptor antibody level of 3.9  She has been treated with methimazole She had been on relatively small doses of methimazole With her TSH being low normal in 10/2019 her dose was increased back to 5 mg alternating with 2.5 mg This has not been changed since then  She says that for the last month at least she has had  She continues to have more fatigue and low energy since early May She also thinks that she may have had a respiratory infection about a month ago and did not have this evaluated Has before tends to get cold easily Her weight is slightly higher and she thinks this is not expected for her small appetite  Last thyrotropin receptor antibody in 12/2019 was slightly better at 2.8, previously was high at 4.1  Labs are pending from today  Wt Readings from Last 3 Encounters:  04/15/20 (!) 212 lb 9.6 oz (96.4 kg)    02/16/20 210 lb 12.8 oz (95.6 kg)  01/04/20 214 lb (97.1 kg)    Thyroid function tests as follows:     Lab Results  Component Value Date   FREET4 1.02 02/16/2020   FREET4 0.75 01/04/2020   FREET4 0.82 11/20/2019   T3FREE 2.8 01/04/2020   T3FREE 2.9 11/20/2019   T3FREE 3.0 08/14/2019   TSH 2.680 02/16/2020   TSH 2.22 01/04/2020   TSH 0.64 11/20/2019    Lab Results  Component Value Date   THYROTRECAB 2.84 (H) 01/04/2020   THYROTRECAB 4.09 (H) 09/25/2019   THYROTRECAB 5.57 (H) 08/14/2019   OPHTHALMOPATHY:  Since about 11/20 she was having more swelling of her eyes, some double vision, and a pressure sensation Subsequently in December she had more fuzzy vision, feeling of fullness in her eyes and continued double vision She went to the urgent care center and was given a Medrol Dosepak  With the recurrence of the symptoms in 1/21 she was again started on tapering dose of prednisone Has  been treated by oculoplastic ophthalmologist and she had been on Medrol high-dose weekly injections started in late February with only mild and short lasting relief  She has finally been able to get [place] and has had 5 infusions  She has had less pressure in her eyes, has better vision and no difficulty with double vision She still has mild swelling of the eyelids  CT scan of orbits has not shown any proptosis and only thickening of the eye muscles    Allergies as of 04/15/2020      Reactions   Hydrochlorothiazide Other (See Comments), Hypertension   Wildly fluctuates B/P low-to-high, then high-to-low, in a matter of minutes   Hydrocodone-acetaminophen Nausea And Vomiting   Other Nausea And Vomiting, Other (See Comments)   Stronger pain meds (post-op) caused dehydration and constipation      Medication List       Accurate as of April 15, 2020  4:07 PM. If you have any questions, ask your nurse or doctor.        Aczone 5 % topical gel Generic drug: Dapsone Apply 1 application  topically See admin instructions. Apply to the face daily as directed   adapalene 0.1 % gel Commonly known as: DIFFERIN Apply topically at bedtime.   amLODipine-benazepril 5-20 MG capsule Commonly known as: LOTREL Take 0.5 capsules by mouth daily. Take 1/2 tablet (2.5-10mg  total) by mouth once daily.   amoxicillin 500 MG capsule Commonly known as: AMOXIL Take 2,000 mg by mouth See admin instructions. Take 2,000 mg by mouth one hour prior to dental appointments   aspirin EC 325 MG tablet Take 325 mg by mouth daily.   Linseed Oil Oil Take 1 capsule by mouth daily.   loratadine 10 MG tablet Commonly known as: CLARITIN Take 10 mg by mouth daily.   methimazole 5 MG tablet Commonly known as: TAPAZOLE TAKE 1 TABLET BY MOUTH ONCE DAILY ALTERNATING WITH 1/2 (ONE-HALF) TABLET THE NEXT DAY   OCUVITE LUTEIN 25 PO Take 25 mg by mouth daily.   selenium 200 MCG Tabs tablet Take by mouth daily.   sulfamethoxazole-trimethoprim 800-160 MG per tablet Commonly known as: BACTRIM DS Take 1 tablet by mouth daily.           Past Medical History:  Diagnosis Date  . 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 Asthma    hx of - PT STATES MILD - NO LONGER REQUIRES INHALERS  . Colitis    mild   . GERD (gastroesophageal reflux disease)   . H/O hiatal hernia   . Hypertension    hx of 10 years ago ; PT'S BLOOD PRESSURE RECENTLY ELEVATED WHILE IN HOSP FOR SURG- NOT ON ANY B/P MEDS  . Thyroid disease     Past Surgical History:  Procedure Laterality Date  . KNEE CLOSED REDUCTION Left 06/05/2014   Procedure: CLOSED MANIPULATION KNEE;  Surgeon: Mauri Pole, MD;  Location: [facility];  Service: Orthopedics;  Laterality: Left;  . RIGHT/LEFT HEART CATH AND CORONARY ANGIOGRAPHY N/A 05/31/2019   Procedure: RIGHT/LEFT HEART CATH AND CORONARY ANGIOGRAPHY;  Surgeon: Martinique, Peter M, MD;  Location: [place] [facility];  Service:  Cardiovascular;  Laterality: N/A;  . TOTAL KNEE ARTHROPLASTY Left 05/07/2014   Procedure: LEFT TOTAL KNEE ARTHROPLASTY;  Surgeon: Mauri Pole, MD;  Location: [facility];  Service: Orthopedics;  Laterality: Left;  . TOTAL KNEE ARTHROPLASTY Right 06/05/2014  Procedure: RIGHT TOTAL KNEE ARTHROPLASTY;  Surgeon: Mauri Pole, MD;  Location: [facility];  Service: Orthopedics;  Laterality: Right;  . WISDOM TEETH EXTRACTIONS      Family History  Problem Relation Age of Onset  . Hypothyroidism Mother   . Graves' disease Sister   . Diabetes Brother   . Diabetes Maternal Aunt     Social History:  reports that she has never smoked. She has never used smokeless tobacco. She reports that she does not drink alcohol and does not use drugs.  Allergies:  Allergies  Allergen Reactions  . Hydrochlorothiazide Other (See Comments) and Hypertension    Wildly fluctuates B/P low-to-high, then high-to-low, in a matter of minutes  . Hydrocodone-Acetaminophen Nausea And Vomiting  . Other Nausea And Vomiting and Other (See Comments)    Stronger pain meds (post-op) caused dehydration and constipation     Review of Systems  No history of hypertension in the past but more recently has had much higher blood pressure readings, as high as 177 systolic Her PCP is started on [place] blood pressure readings are being monitored regularly   BP Readings from Last 3 Encounters:  04/15/20 (!) 142/82  02/16/20 (!) 154/92  01/04/20 134/80      Examination:   BP (!) 142/82 (BP Location: Left Arm, Patient Position: Sitting, Cuff Size: Large)   Pulse 88   Ht 5\' 1"  (1.549 m)   Wt (!) 212 lb 9.6 oz (96.4 kg)   SpO2 97%   BMI 40.17 kg/m    Mild swelling of the eyes present No proptosis No significant ankle edema   Assessment/Plan:   Hyperthyroidism, from Graves' disease with baseline thyrotropin receptor antibody of 3.9  She has been on methimazole since early September 2020   Has been continued on  the same dose of methimazole, alternating 2.5 and 5 mg since 3/21  Her symptoms of fatigue are persistent even before her last visit when her thyroid levels were normal She looks euthyroid No new symptoms  We will check her thyroid labs and decide on methimazole dosage Also recheck thyrotropin receptor antibody   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 Any transcriptional errors that result from this process are unintentional.

## 2020-04-16 LAB — T4, FREE: Free T4: 0.71 ng/dL (ref 0.60–1.60)

## 2020-04-16 LAB — THYROTROPIN RECEPTOR AUTOABS: Thyrotropin Receptor Ab: 2.28 IU/L — ABNORMAL HIGH (ref 0.00–1.75)

## 2020-04-16 LAB — TSH: TSH: 1.91 u[IU]/mL (ref 0.35–4.50)

## 2020-04-23 DIAGNOSIS — E0500 Thyrotoxicosis with diffuse goiter without thyrotoxic crisis or storm: Secondary | ICD-10-CM | POA: Diagnosis not present

## 2020-04-29 ENCOUNTER — Other Ambulatory Visit: Payer: Self-pay | Admitting: Endocrinology

## 2020-05-01 DIAGNOSIS — H532 Diplopia: Secondary | ICD-10-CM | POA: Diagnosis not present

## 2020-05-01 DIAGNOSIS — H40053 Ocular hypertension, bilateral: Secondary | ICD-10-CM | POA: Diagnosis not present

## 2020-05-01 DIAGNOSIS — H04129 Dry eye syndrome of unspecified lacrimal gland: Secondary | ICD-10-CM | POA: Diagnosis not present

## 2020-05-01 DIAGNOSIS — E0500 Thyrotoxicosis with diffuse goiter without thyrotoxic crisis or storm: Secondary | ICD-10-CM | POA: Diagnosis not present

## 2020-05-07 DIAGNOSIS — I10 Essential (primary) hypertension: Secondary | ICD-10-CM | POA: Diagnosis not present

## 2020-05-07 DIAGNOSIS — E0500 Thyrotoxicosis with diffuse goiter without thyrotoxic crisis or storm: Secondary | ICD-10-CM | POA: Diagnosis not present

## 2020-05-14 DIAGNOSIS — E0500 Thyrotoxicosis with diffuse goiter without thyrotoxic crisis or storm: Secondary | ICD-10-CM | POA: Diagnosis not present

## 2020-06-04 DIAGNOSIS — E0500 Thyrotoxicosis with diffuse goiter without thyrotoxic crisis or storm: Secondary | ICD-10-CM | POA: Diagnosis not present

## 2020-06-10 ENCOUNTER — Encounter: Payer: Self-pay | Admitting: Endocrinology

## 2020-06-10 ENCOUNTER — Other Ambulatory Visit (INDEPENDENT_AMBULATORY_CARE_PROVIDER_SITE_OTHER): Payer: Medicare Other

## 2020-06-10 ENCOUNTER — Other Ambulatory Visit: Payer: Self-pay

## 2020-06-10 ENCOUNTER — Ambulatory Visit (INDEPENDENT_AMBULATORY_CARE_PROVIDER_SITE_OTHER): Payer: Medicare Other | Admitting: Endocrinology

## 2020-06-10 VITALS — BP 148/78 | HR 80 | Ht 61.0 in | Wt 207.0 lb

## 2020-06-10 DIAGNOSIS — I10 Essential (primary) hypertension: Secondary | ICD-10-CM | POA: Diagnosis not present

## 2020-06-10 DIAGNOSIS — E0590 Thyrotoxicosis, unspecified without thyrotoxic crisis or storm: Secondary | ICD-10-CM

## 2020-06-10 NOTE — Progress Notes (Signed)
Patient ID: Christina [name], female   DOB: 11-Jul-1951, 68 y.o.   MRN: [id number]                                                                                                               Reason for Appointment:  Hyperthyroidism, follow-up visit   Chief complaint: Follow-up of thyroid   History of Present Illness:   Prior history: For the last several years the patient has had symptoms of occasional palpitations, shortness of breath on exertion, shakiness and feeling somewhat warm.  However she did not have any recent worsening of the symptoms except on the morning of hospitalization She previously did not have any unusual weight loss, anxiety or fatigue She was also having some swelling of her legs prior to admission Since she was having difficulties with tachycardia and atrial fibrillation her thyroid level was checked in the hospital and indicated hyperthyroidism  RECENT HISTORY:  Baseline symptoms before treatment were occasional palpitations, shortness of breath on exertion, shakiness and feeling warm She has Graves' disease confirmed with a baseline thyrotropin receptor antibody level of 3.9  She has been treated with methimazole She had been on relatively small doses of methimazole With her TSH being low normal in 10/2019 her dose was increased back to 5 mg alternating with 2.5 mg The dose has not been changed since then  Thyroid levels have been consistently normal and not changed in 7/21 She thinks she is a little less tired than before She has had a decreased appetite again and is now showing a little weight loss Again she tends to get cold easily which is not new  Last thyrotropin receptor antibody in 7/21 was only slightly better at 2.3, previously was high at 4.1  Labs are pending from today  Wt Readings from Last 3 Encounters:  06/10/20 207 lb (93.9 kg)  04/15/20 (!) 212 lb 9.6 oz (96.4 kg)  02/16/20 210 lb 12.8 oz (95.6 kg)   Thyroid function tests as follows:      Lab Results  Component Value Date   FREET4 0.71 04/15/2020   FREET4 1.02 02/16/2020   FREET4 0.75 01/04/2020   T3FREE 2.8 01/04/2020   T3FREE 2.9 11/20/2019   T3FREE 3.0 08/14/2019   TSH 1.91 04/15/2020   TSH 2.680 02/16/2020   TSH 2.22 01/04/2020    Lab Results  Component Value Date   THYROTRECAB 2.28 (H) 04/15/2020   THYROTRECAB 2.84 (H) 01/04/2020   THYROTRECAB 4.09 (H) 09/25/2019   OPHTHALMOPATHY:  Since about 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 she was having more swelling of her eyes, some double vision, and a pressure sensation Subsequently in December she had more fuzzy vision, feeling of fullness in her eyes and continued double vision She went to the urgent care center and was given a Medrol Dosepak  With the recurrence of the symptoms in 1/21 she was again started on tapering dose of prednisone Has been treated by oculoplastic ophthalmologist and she had been on Medrol high-dose weekly injections started in late February with only mild and  short lasting relief  She has been able to complete the course of Tepezza  infusions  She has had less pressure in her eyes with less restricted movement and no double vision Swelling of the eyes is improving  CT scan of orbits has not shown any proptosis and only thickening of the eye muscles    Allergies as of 06/10/2020      Reactions   Hydrochlorothiazide Other (See Comments), Hypertension   Wildly fluctuates B/P low-to-high, then high-to-low, in a matter of minutes   Hydrocodone-acetaminophen Nausea And Vomiting   Other Nausea And Vomiting, Other (See Comments)   Stronger pain meds (post-op) caused dehydration and constipation      Medication List       Accurate as of June 10, 2020  4:09 PM. If you have any questions, ask your nurse or doctor.        Aczone 5 % topical gel Generic drug: Dapsone Apply 1 application topically See admin instructions. Apply to the face daily as directed   adapalene 0.1 % gel Commonly known as:  DIFFERIN Apply topically at bedtime.   amLODipine-benazepril 5-20 MG capsule Commonly known as: LOTREL Take 0.5 capsules by mouth daily. Take 1/2 tablet (2.5-10mg  total) by mouth once daily.   amoxicillin 500 MG capsule Commonly known as: AMOXIL Take 2,000 mg by mouth See admin instructions. Take 2,000 mg by mouth one hour prior to dental appointments   aspirin EC 325 MG tablet Take 325 mg by mouth daily.   Linseed Oil Oil Take 1 capsule by mouth daily.   loratadine 10 MG tablet Commonly known as: CLARITIN Take 10 mg by mouth daily.   methimazole 5 MG tablet Commonly known as: TAPAZOLE TAKE 1 TABLET BY MOUTH ONCE DAILY ALTERNATING WITH 1/2 (ONE-HALF) TABLET THE NEXT DAY   OCUVITE LUTEIN 25 PO Take 25 mg by mouth daily.   selenium 200 MCG Tabs tablet Take by mouth daily.   sulfamethoxazole-trimethoprim 800-160 MG per tablet Commonly known as: BACTRIM DS Take 1 tablet by mouth daily.           Past Medical History:  Diagnosis Date  . 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 Asthma    hx of - PT STATES MILD - NO LONGER REQUIRES INHALERS  . Colitis    mild   . GERD (gastroesophageal reflux disease)   . H/O hiatal hernia   . Hypertension    hx of 10 years ago ; PT'S BLOOD PRESSURE RECENTLY ELEVATED WHILE IN HOSP FOR SURG- NOT ON ANY B/P MEDS  . Thyroid disease     Past Surgical History:  Procedure Laterality Date  . KNEE CLOSED REDUCTION Left 06/05/2014   Procedure: CLOSED MANIPULATION KNEE;  Surgeon: Mauri Pole, MD;  Location: [facility];  Service: Orthopedics;  Laterality: Left;  . RIGHT/LEFT HEART CATH AND CORONARY ANGIOGRAPHY N/A 05/31/2019   Procedure: RIGHT/LEFT HEART CATH AND CORONARY ANGIOGRAPHY;  Surgeon: Martinique, Peter M, MD;  Location: [place] [facility];  Service: Cardiovascular;  Laterality: N/A;  . TOTAL KNEE ARTHROPLASTY Left 05/07/2014   Procedure: LEFT TOTAL KNEE ARTHROPLASTY;   Surgeon: Mauri Pole, MD;  Location: [facility];  Service: Orthopedics;  Laterality: Left;  . TOTAL KNEE ARTHROPLASTY Right 06/05/2014   Procedure: RIGHT TOTAL KNEE ARTHROPLASTY;  Surgeon: Mauri Pole, MD;  Location: [facility];  Service: Orthopedics;  Laterality: Right;  . WISDOM TEETH EXTRACTIONS  Family History  Problem Relation Age of Onset  . Hypothyroidism Mother   . Graves' disease Sister   . Diabetes Brother   . Diabetes Maternal Aunt     Social History:  reports that she has never smoked. She has never used smokeless tobacco. She reports that she does not drink alcohol and does not use drugs.  Allergies:  Allergies  Allergen Reactions  . Hydrochlorothiazide Other (See Comments) and Hypertension    Wildly fluctuates B/P low-to-high, then high-to-low, in a matter of minutes  . Hydrocodone-Acetaminophen Nausea And Vomiting  . Other Nausea And Vomiting and Other (See Comments)    Stronger pain meds (post-op) caused dehydration and constipation     Review of Systems   history of hypertension,  recently has had much higher blood pressure readings, as high as 342 systolic Her PCP has started on [place] blood pressure readings are relatively better reportedly   BP Readings from Last 3 Encounters:  06/10/20 (!) 148/78  04/15/20 (!) 142/82  02/16/20 (!) 154/92      Examination:   BP (!) 148/78   Pulse 80   Ht 5\' 1"  (1.549 m)   Wt 207 lb (93.9 kg)   SpO2 97%   BMI 39.11 kg/m    Relatively mild swelling of the eyes present, mostly lower eyelids No proptosis Ocular movements normal Biceps reflexes slightly brisk   Assessment/Plan:   Hyperthyroidism, from Graves' disease with baseline thyrotropin receptor antibody of 3.9  She has been on methimazole since early September 2020   Hyperthyroidism has been controlled same dose of methimazole, alternating 2.5 and 5 mg since 3/21 With this she has been euthyroid with normal TSH  Currently not have any  symptoms suggestive of hypothyroidism or hyperthyroidism  We will check her thyroid labs and decide on methimazole dosage We will continue methimazole until she is in remission   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 Any transcriptional errors that result from this process are unintentional.

## 2020-06-11 ENCOUNTER — Other Ambulatory Visit: Payer: Self-pay

## 2020-06-11 DIAGNOSIS — I10 Essential (primary) hypertension: Secondary | ICD-10-CM

## 2020-06-11 LAB — BASIC METABOLIC PANEL
BUN: 15 mg/dL (ref 6–23)
CO2: 23 mEq/L (ref 19–32)
Calcium: 9.5 mg/dL (ref 8.4–10.5)
Chloride: 99 mEq/L (ref 96–112)
Creatinine, Ser: 0.99 mg/dL (ref 0.40–1.20)
GFR: 55.99 mL/min — ABNORMAL LOW (ref >60.00)
Glucose, Bld: 103 mg/dL — ABNORMAL HIGH (ref 70–99)
Potassium: 4 mEq/L (ref 3.5–5.1)
Sodium: 135 mEq/L (ref 135–145)

## 2020-06-11 LAB — T4, FREE: Free T4: 1.19 ng/dL (ref 0.60–1.60)

## 2020-06-11 LAB — TSH: TSH: 0.02 u[IU]/mL — ABNORMAL LOW (ref 0.35–4.50)

## 2020-06-12 ENCOUNTER — Other Ambulatory Visit: Payer: Self-pay

## 2020-06-12 MED ORDER — METHIMAZOLE 5 MG PO TABS: ORAL_TABLET | ORAL | 2 refills | Status: DC

## 2020-07-03 DIAGNOSIS — H40013 Open angle with borderline findings, low risk, bilateral: Secondary | ICD-10-CM | POA: Diagnosis not present

## 2020-07-03 DIAGNOSIS — H2513 Age-related nuclear cataract, bilateral: Secondary | ICD-10-CM | POA: Diagnosis not present

## 2020-07-03 DIAGNOSIS — E0500 Thyrotoxicosis with diffuse goiter without thyrotoxic crisis or storm: Secondary | ICD-10-CM | POA: Diagnosis not present

## 2020-07-15 ENCOUNTER — Encounter: Payer: Self-pay | Admitting: Endocrinology

## 2020-07-15 ENCOUNTER — Other Ambulatory Visit: Payer: Self-pay

## 2020-07-15 ENCOUNTER — Ambulatory Visit (INDEPENDENT_AMBULATORY_CARE_PROVIDER_SITE_OTHER): Payer: Medicare Other | Admitting: Endocrinology

## 2020-07-15 VITALS — BP 120/82 | HR 65 | Wt 202.8 lb

## 2020-07-15 DIAGNOSIS — E0590 Thyrotoxicosis, unspecified without thyrotoxic crisis or storm: Secondary | ICD-10-CM

## 2020-07-15 DIAGNOSIS — R634 Abnormal weight loss: Secondary | ICD-10-CM | POA: Diagnosis not present

## 2020-07-15 LAB — T4, FREE: Free T4: 0.95 ng/dL (ref 0.60–1.60)

## 2020-07-15 LAB — TSH: TSH: 0.01 u[IU]/mL — ABNORMAL LOW (ref 0.35–4.50)

## 2020-07-15 LAB — T3, FREE: T3, Free: 4.9 pg/mL — ABNORMAL HIGH (ref 2.3–4.2)

## 2020-07-15 NOTE — Progress Notes (Addendum)
Patient ID: Christina [name], female   DOB: October 22, 1950, 69 y.o.   MRN: [id number]                                                                                                               Reason for Appointment:  Hyperthyroidism, follow-up visit   Chief complaint: Follow-up of thyroid   History of Present Illness:   Prior history: For the last several years the patient has had symptoms of occasional palpitations, shortness of breath on exertion, shakiness and feeling somewhat warm.  However she did not have any recent worsening of the symptoms except on the morning of hospitalization She previously did not have any unusual weight loss, anxiety or fatigue She was also having some swelling of her legs prior to admission Since she was having difficulties with tachycardia and atrial fibrillation her thyroid level was checked in the hospital and indicated hyperthyroidism  RECENT HISTORY:  Baseline symptoms before treatment were occasional palpitations, shortness of breath on exertion, shakiness and feeling warm She has Graves' disease confirmed with a baseline thyrotropin receptor antibody level of 3.9  She has been treated with methimazole She had been on relatively small doses of methimazole With her TSH being low in 05/2020 her dose was increased back to 5 mg once every day  She did not feel any different with making the dosage change  Thyroid levels are pending from today  She will occasionally have some shortness of breath on exertion but no other symptoms including palpitations, shakiness, heat or cold intolerance She is not as cold as before She has had a decreased appetite along with loss of taste and continues to lose weight  No hair loss  Last thyrotropin receptor antibody in 7/21 was only slightly better at 2.3, previously was high at 4.1  Labs are pending from today  Wt Readings from Last 3 Encounters:  07/15/20 202 lb 12.8 oz (92 kg)  06/10/20 207 lb (93.9 kg)    04/15/20 (!) 212 lb 9.6 oz (96.4 kg)   Thyroid function tests as follows:     Lab Results  Component Value Date   FREET4 1.19 06/10/2020   FREET4 0.71 04/15/2020   FREET4 1.02 02/16/2020   T3FREE 2.8 01/04/2020   T3FREE 2.9 11/20/2019   T3FREE 3.0 08/14/2019   TSH 0.02 (L) 06/10/2020   TSH 1.91 04/15/2020   TSH 2.680 02/16/2020    Lab Results  Component Value Date   THYROTRECAB 2.28 (H) 04/15/2020   THYROTRECAB 2.84 (H) 01/04/2020   THYROTRECAB 4.09 (H) 09/25/2019   OPHTHALMOPATHY:  Since about 11/20 she was having more swelling of her eyes, some double vision, and a pressure sensation Subsequently in December she had more fuzzy vision, feeling of fullness in her eyes and continued double vision She went to the urgent care center and was given a Medrol Dosepak  With the recurrence of the symptoms in 1/21 she was again started on tapering dose of prednisone Has been treated by oculoplastic ophthalmologist and she had been on  Medrol high-dose weekly injections started in late February with only mild and short lasting relief  She has been able to complete the course of Tepezza  infusions  She has had less pressure in her eyes with less restricted movement and no double vision Swelling of the eyes is improving  CT scan of orbits has not shown any proptosis and only thickening of the eye muscles    Allergies as of 07/15/2020      Reactions   Hydrochlorothiazide Other (See Comments), Hypertension   Wildly fluctuates B/P low-to-high, then high-to-low, in a matter of minutes   Hydrocodone-acetaminophen Nausea And Vomiting   Other Nausea And Vomiting, Other (See Comments)   Stronger pain meds (post-op) caused dehydration and constipation      Medication List       Accurate as of July 15, 2020  1:09 PM. If you have any questions, ask your nurse or doctor.        Aczone 5 % topical gel Generic drug: Dapsone Apply 1 application topically See admin instructions.  Apply to the face daily as directed   adapalene 0.1 % gel Commonly known as: DIFFERIN Apply topically at bedtime.   amLODipine-benazepril 5-20 MG capsule Commonly known as: LOTREL Take 0.5 capsules by mouth daily. Take 1/2 tablet (2.5-10mg  total) by mouth once daily.   amoxicillin 500 MG capsule Commonly known as: AMOXIL Take 2,000 mg by mouth See admin instructions. Take 2,000 mg by mouth one hour prior to dental appointments   aspirin EC 325 MG tablet Take 325 mg by mouth daily.   Linseed Oil Oil Take 1 capsule by mouth daily.   loratadine 10 MG tablet Commonly known as: CLARITIN Take 10 mg by mouth daily.   methimazole 5 MG tablet Commonly known as: TAPAZOLE Take one tab daily   OCUVITE LUTEIN 25 PO Take 25 mg by mouth daily.   selenium 200 MCG Tabs tablet Take by mouth daily.   sulfamethoxazole-trimethoprim 800-160 MG per tablet Commonly known as: BACTRIM DS Take 1 tablet by mouth daily.           Past Medical History:  Diagnosis Date  . 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 Asthma    hx of - PT STATES MILD - NO LONGER REQUIRES INHALERS  . Colitis    mild   . GERD (gastroesophageal reflux disease)   . H/O hiatal hernia   . Hypertension    hx of 10 years ago ; PT'S BLOOD PRESSURE RECENTLY ELEVATED WHILE IN HOSP FOR SURG- NOT ON ANY B/P MEDS  . Thyroid disease     Past Surgical History:  Procedure Laterality Date  . KNEE CLOSED REDUCTION Left 06/05/2014   Procedure: CLOSED MANIPULATION KNEE;  Surgeon: Mauri Pole, MD;  Location: [facility];  Service: Orthopedics;  Laterality: Left;  . RIGHT/LEFT HEART CATH AND CORONARY ANGIOGRAPHY N/A 05/31/2019   Procedure: RIGHT/LEFT HEART CATH AND CORONARY ANGIOGRAPHY;  Surgeon: Martinique, Peter M, MD;  Location: [facility];  Service: Cardiovascular;  Laterality: N/A;  . TOTAL KNEE ARTHROPLASTY Left 05/07/2014   Procedure: LEFT TOTAL KNEE  ARTHROPLASTY;  Surgeon: Mauri Pole, MD;  Location: [facility];  Service: Orthopedics;  Laterality: Left;  . TOTAL KNEE ARTHROPLASTY Right 06/05/2014   Procedure: RIGHT TOTAL KNEE ARTHROPLASTY;  Surgeon: Mauri Pole, MD;  Location: [facility];  Service: Orthopedics;  Laterality: Right;  . WISDOM TEETH  EXTRACTIONS      Family History  Problem Relation Age of Onset  . Hypothyroidism Mother   . Graves' disease Sister   . Diabetes Brother   . Diabetes Maternal Aunt     Social History:  reports that she has never smoked. She has never used smokeless tobacco. She reports that she does not drink alcohol and does not use drugs.  Allergies:  Allergies  Allergen Reactions  . Hydrochlorothiazide Other (See Comments) and Hypertension    Wildly fluctuates B/P low-to-high, then high-to-low, in a matter of minutes  . Hydrocodone-Acetaminophen Nausea And Vomiting  . Other Nausea And Vomiting and Other (See Comments)    Stronger pain meds (post-op) caused dehydration and constipation     Review of Systems   history of hypertension,  recently has had much higher blood pressure readings, as high as 275 systolic Her PCP has started on Lotrel   BP Readings from Last 3 Encounters:  07/15/20 120/82  06/10/20 (!) 148/78  04/15/20 (!) 142/82      Examination:   BP 120/82   Pulse 65   Wt 202 lb 12.8 oz (92 kg)   SpO2 98%   BMI 38.32 kg/m    No significant swelling of the eyelids currently  No proptosis evident  Mild retraction of the right upper eyelid present  Thyroid not palpable   Assessment/Plan:   Hyperthyroidism, from Graves' disease with baseline thyrotropin receptor antibody of 3.9  She has been on methimazole since early September 2020   Hyperthyroidism has been controlled same dose of methimazole, now taking 5 mg since her last visit last month She has minimal symptoms and usually her symptoms with abnormal thyroid levels are nonspecific  We will check her thyroid  labs and decide on methimazole dosage Discussed possibility of using I-131 but may not be desirable because of her history of ophthalmopathy Also will check thyrotropin receptor antibody today  For her weight loss recommended that she start using Ensure or boost supplements  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 Any transcriptional errors that result from this process are unintentional.  Addendum: T3 is high at 4.9 with suppressed TSH, and will need to increase methimazole to 5 mg twice daily

## 2020-07-16 ENCOUNTER — Other Ambulatory Visit: Payer: Self-pay | Admitting: *Deleted

## 2020-07-16 LAB — THYROTROPIN RECEPTOR AUTOABS: Thyrotropin Receptor Ab: 9.46 IU/L — ABNORMAL HIGH (ref 0.00–1.75)

## 2020-07-16 MED ORDER — METHIMAZOLE 5 MG PO TABS: ORAL_TABLET | ORAL | 2 refills | Status: DC

## 2020-07-16 NOTE — Progress Notes (Signed)
Thyroid level has gone up further.  Need to increase methimazole to 5 mg twice daily.  Need to send an updated prescription for methimazole.  Keep follow-up appointment as scheduled.

## 2020-07-17 ENCOUNTER — Other Ambulatory Visit: Payer: Self-pay | Admitting: Ophthalmology

## 2020-07-17 DIAGNOSIS — H40053 Ocular hypertension, bilateral: Secondary | ICD-10-CM | POA: Diagnosis not present

## 2020-07-17 DIAGNOSIS — E0500 Thyrotoxicosis with diffuse goiter without thyrotoxic crisis or storm: Secondary | ICD-10-CM | POA: Diagnosis not present

## 2020-07-17 DIAGNOSIS — H02534 Eyelid retraction left upper eyelid: Secondary | ICD-10-CM | POA: Diagnosis not present

## 2020-07-17 DIAGNOSIS — H532 Diplopia: Secondary | ICD-10-CM | POA: Diagnosis not present

## 2020-07-17 DIAGNOSIS — H04123 Dry eye syndrome of bilateral lacrimal glands: Secondary | ICD-10-CM | POA: Diagnosis not present

## 2020-07-17 DIAGNOSIS — H02531 Eyelid retraction right upper eyelid: Secondary | ICD-10-CM | POA: Diagnosis not present

## 2020-07-22 DIAGNOSIS — E0500 Thyrotoxicosis with diffuse goiter without thyrotoxic crisis or storm: Secondary | ICD-10-CM | POA: Diagnosis not present

## 2020-07-22 DIAGNOSIS — H04123 Dry eye syndrome of bilateral lacrimal glands: Secondary | ICD-10-CM | POA: Diagnosis not present

## 2020-07-22 DIAGNOSIS — H40053 Ocular hypertension, bilateral: Secondary | ICD-10-CM | POA: Diagnosis not present

## 2020-07-22 DIAGNOSIS — H532 Diplopia: Secondary | ICD-10-CM | POA: Diagnosis not present

## 2020-08-01 ENCOUNTER — Ambulatory Visit
Admission: RE | Admit: 2020-08-01 | Discharge: 2020-08-01 | Disposition: A | Discharge status: Home / Self Care | Payer: Medicare Other | Source: Ambulatory Visit | Attending: Ophthalmology | Admitting: Ophthalmology

## 2020-08-01 ENCOUNTER — Other Ambulatory Visit: Payer: Self-pay

## 2020-08-01 ENCOUNTER — Ambulatory Visit (INDEPENDENT_AMBULATORY_CARE_PROVIDER_SITE_OTHER): Payer: Medicare Other | Admitting: Cardiology

## 2020-08-01 ENCOUNTER — Encounter: Payer: Self-pay | Admitting: Cardiology

## 2020-08-01 VITALS — BP 132/78 | HR 73 | Ht 60.0 in | Wt 203.4 lb

## 2020-08-01 DIAGNOSIS — I480 Paroxysmal atrial fibrillation: Secondary | ICD-10-CM | POA: Diagnosis not present

## 2020-08-01 DIAGNOSIS — I10 Essential (primary) hypertension: Secondary | ICD-10-CM | POA: Diagnosis not present

## 2020-08-01 DIAGNOSIS — E0500 Thyrotoxicosis with diffuse goiter without thyrotoxic crisis or storm: Secondary | ICD-10-CM

## 2020-08-01 DIAGNOSIS — M6289 Other specified disorders of muscle: Secondary | ICD-10-CM | POA: Diagnosis not present

## 2020-08-01 DIAGNOSIS — I5022 Chronic systolic (congestive) heart failure: Secondary | ICD-10-CM | POA: Diagnosis not present

## 2020-08-01 IMAGING — CT CT ORBITS W/O CM
1 series · 15 of 30 positions shown, 19 images · non-contrast
Comparison: [DATE].

CLINICAL DATA: Graves disease.  Thyroid eye disease.  Follow-up.

EXAM:
CT ORBITS WITHOUT CONTRAST
TECHNIQUE: Multidetector CT images were obtained using the standard protocol
without intravenous contrast.

[Series 4: orbits-soft · axial · 0.39mm/px · z∈[-158,-46]mm · 15 of 62 slices shown, 19 images]
[im 3/62  brain]
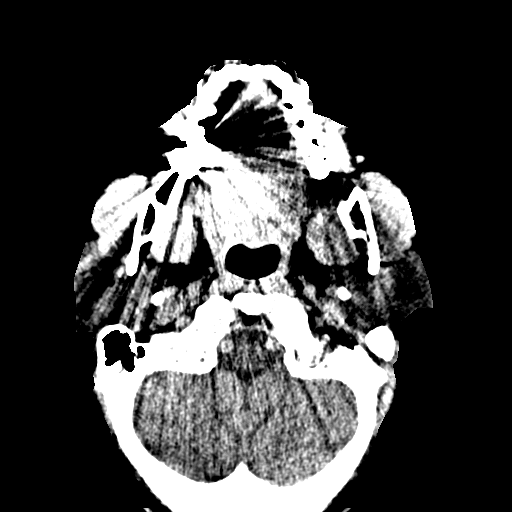
[im 3/62  bone]
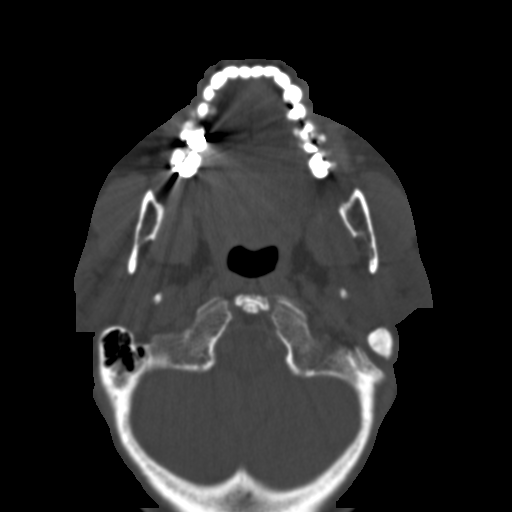
[im 7/62  bone]
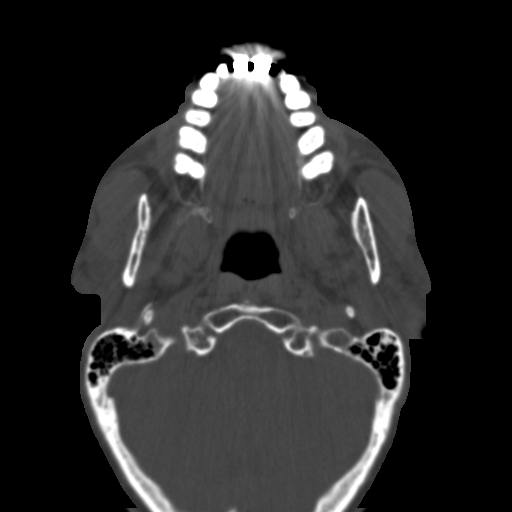
[im 11/62  bone]
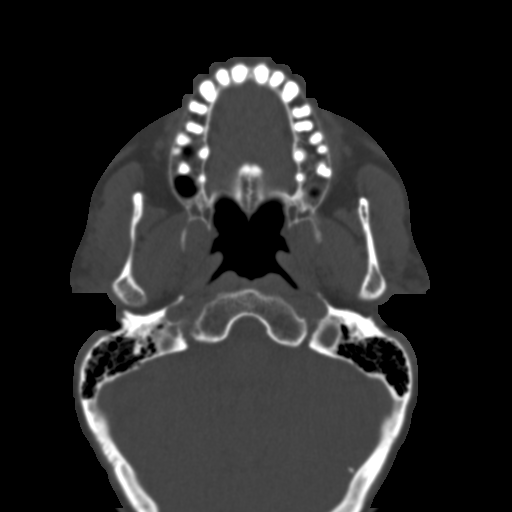
[im 15/62  bone]
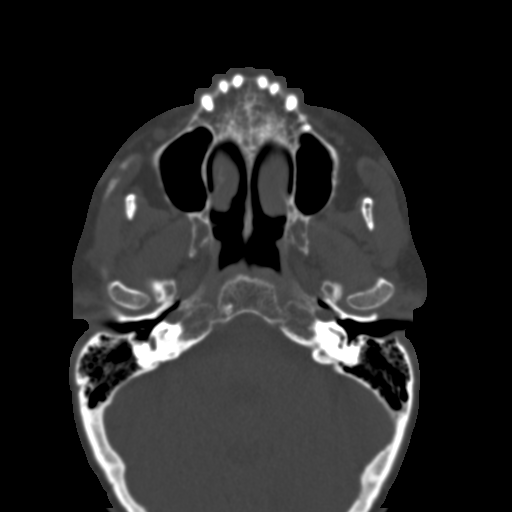
[im 19/62  brain]
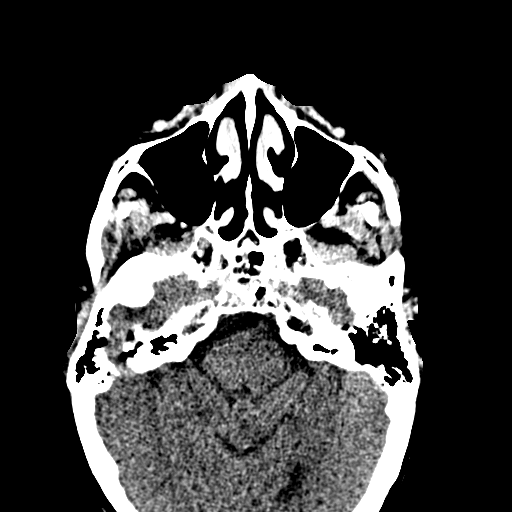
[im 19/62  bone]
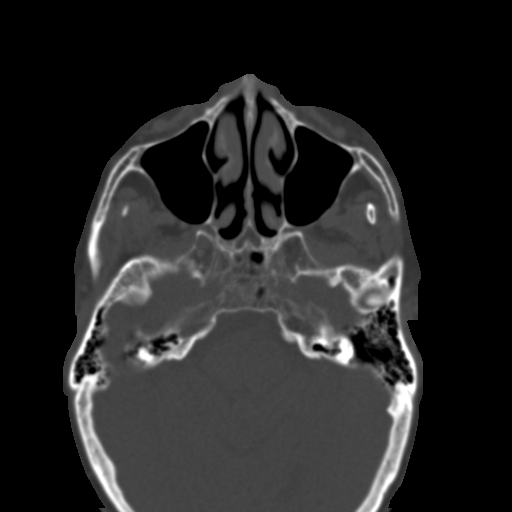
[im 24/62  bone]
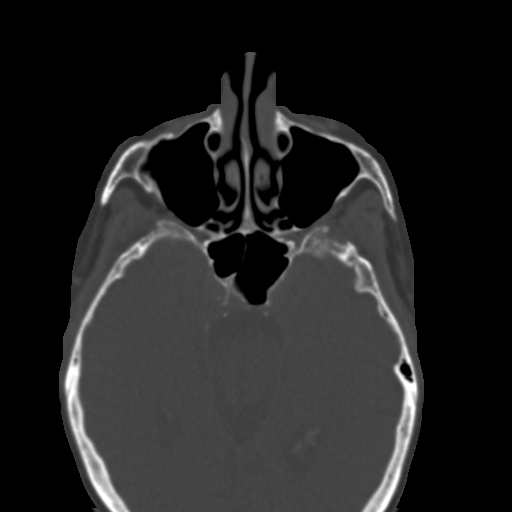
[im 28/62  bone]
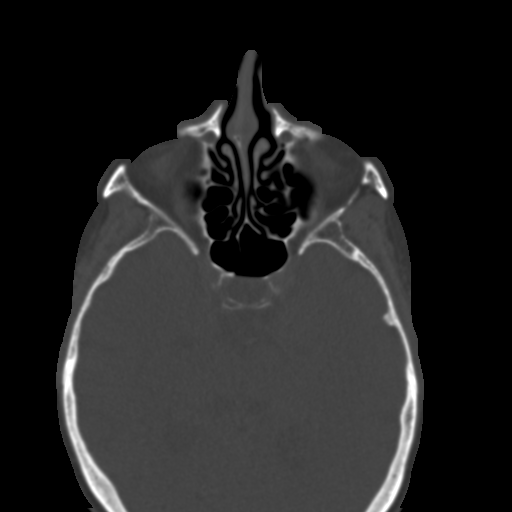
[im 32/62  bone]
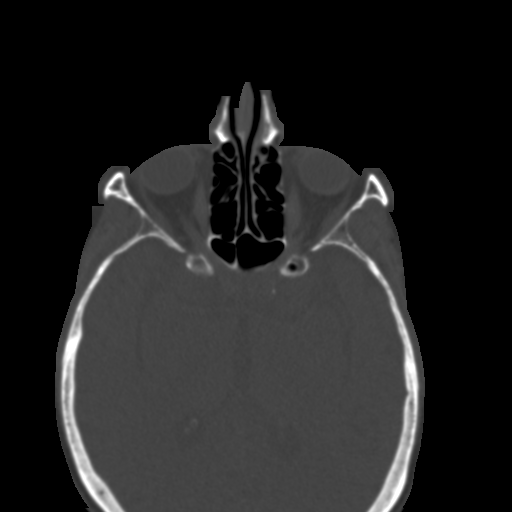
[im 34/62  brain]
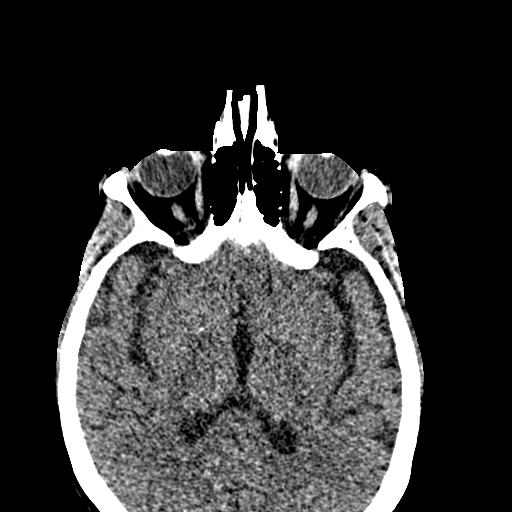
[im 34/62  bone]
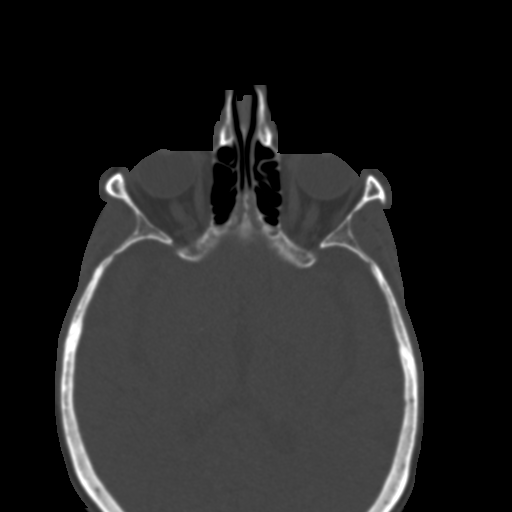
[im 38/62  bone]
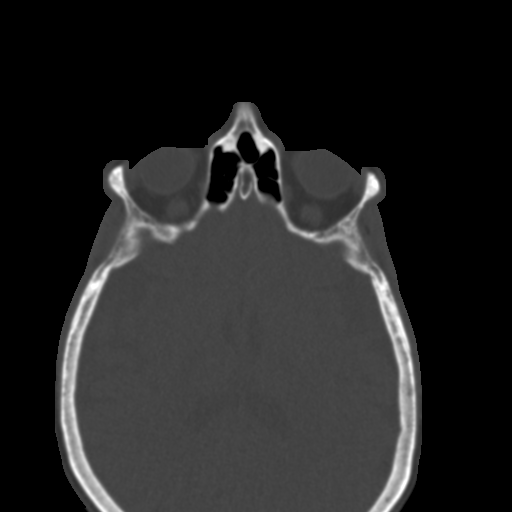
[im 43/62  bone]
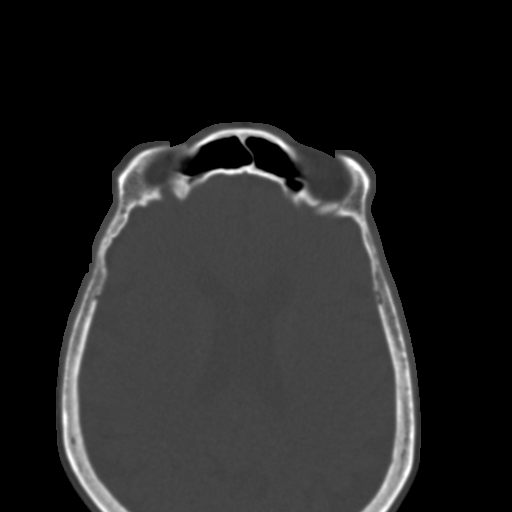
[im 47/62  bone]
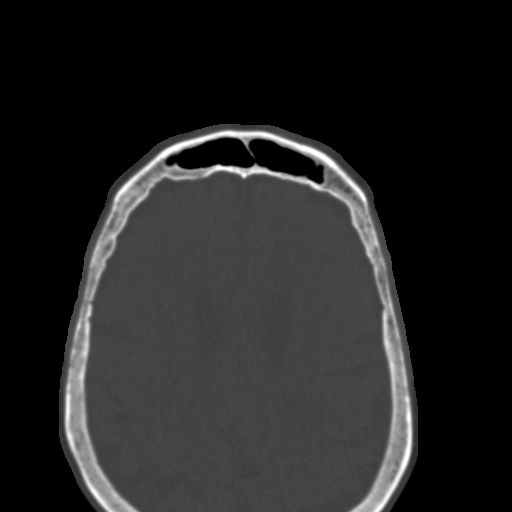
[im 51/62  brain]
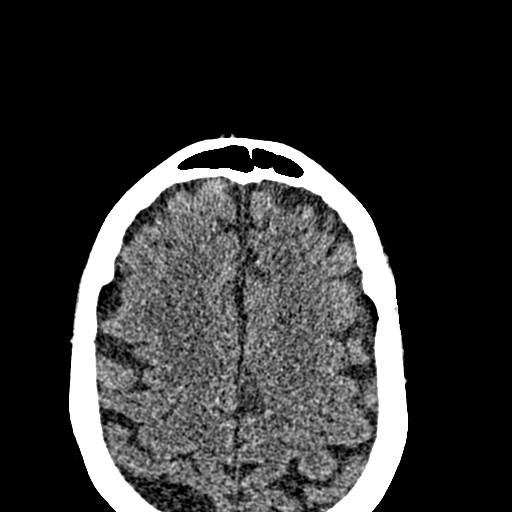
[im 51/62  bone]
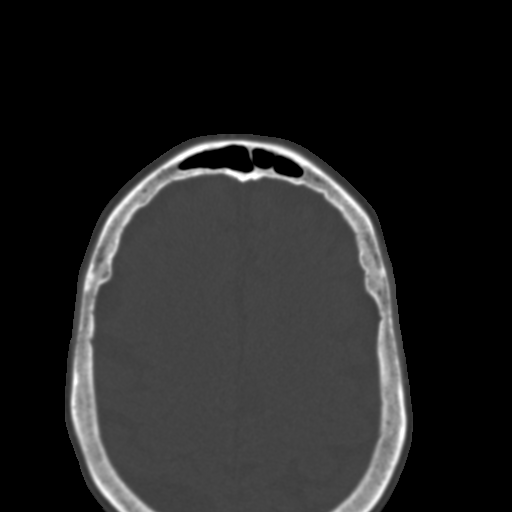
[im 55/62  bone]
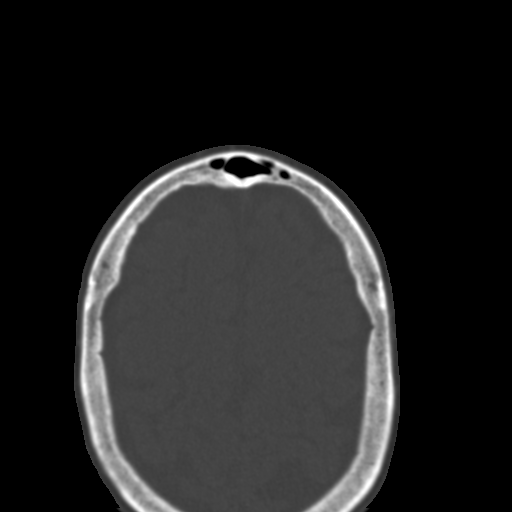
[im 59/62  bone]
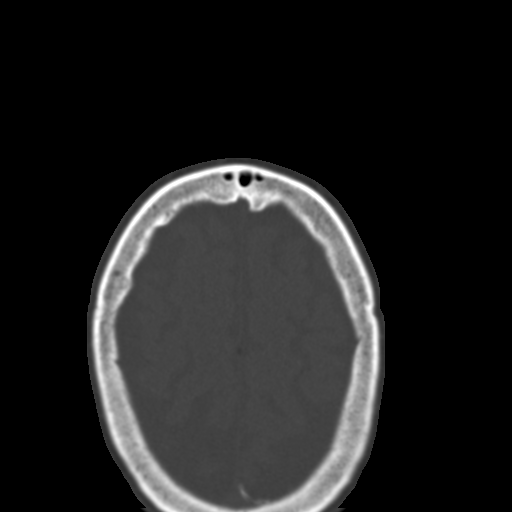

[15 of 30 positions shown; findings below may reference images not displayed]

FINDINGS: Orbits: Both globes appear normal. There is diminished enlargement
of the medial and inferior rectus muscles on both sides. Transverse
diameter of the medial rectus on the right today measures 4.5 x 10
mm compared with 6.5 x 10 mm previously. Transverse diameter of the
inferior rectus muscle measures 4.5 x 10 mm compared with 5 x 11 mm
previously. Orbital fat remains normal. Optic nerves appear normal.
No abnormal vascular structures. No proptosis.

Visualized sinuses: Clear

Soft tissues: Otherwise normal

Limited intracranial: Normal
IMPRESSION: Diminished enlargement of the medial and inferior rectus muscles on
both sides. Transverse diameter of the medial rectus on the right
measures 4.5 x 10 mm compared with 6.5 x 10 mm previously.
Transverse diameter of the inferior rectus muscle measures 4.5 x 10
mm compared with 5 x 11 mm previously.

## 2020-08-01 MED ORDER — FUROSEMIDE 20 MG PO TABS: 20.0000 mg | ORAL_TABLET | Freq: Every day | ORAL | 3 refills | Status: DC

## 2020-08-01 NOTE — Addendum Note (Signed)
Addended by: Antonieta Iba on: 08/01/2020 09:53 AM   Modules accepted: Orders

## 2020-08-01 NOTE — Progress Notes (Signed)
Cardiology Office Note:    Date:  08/01/2020   ID:  Christina [name], DOB 1950-10-23, MRN [id number]  PCP:  Mateo Flow, MD  Cardiologist:  Fransico Him, MD    Referring MD: Mateo Flow, MD   Chief Complaint  Patient presents with  . Congestive Heart Failure  . Hypertension  . Atrial Fibrillation    History of Present Illness:    Christina [name] is a 69 y.o. female with a hx of chronic systolic CHF, normal coronary arteries, PAF, hyperthyroidism, asthma, GERD and HTN. She is here today for followup and is doing well. She still has problems with hyperthyroidism and sees Endocrine and is on Tapazole. She had some problems with elevated BPs and SOB after getting an infusion of drug for her thyroid eye disease and also had COVID 19 and still cannot taste.  She has some LE edema when she is up on her feet a lot.  She denies any chest pain or pressure, PND, orthopnea, dizziness, palpitations or syncope. She is compliant with her meds and is tolerating meds with no SE.    Past Medical History:  Diagnosis Date  . 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 Asthma    hx of - PT STATES MILD - NO LONGER REQUIRES INHALERS  . Colitis    mild   . GERD (gastroesophageal reflux disease)   . H/O hiatal hernia   . Hypertension    hx of 10 years ago ; PT'S BLOOD PRESSURE RECENTLY ELEVATED WHILE IN HOSP FOR SURG- NOT ON ANY B/P MEDS  . Hyperthyroidism   . PAF (paroxysmal atrial fibrillation) ([place])     Past Surgical History:  Procedure Laterality Date  . KNEE CLOSED REDUCTION Left 06/05/2014   Procedure: CLOSED MANIPULATION KNEE;  Surgeon: Mauri Pole, MD;  Location: [facility];  Service: Orthopedics;  Laterality: Left;  . RIGHT/LEFT HEART CATH AND CORONARY ANGIOGRAPHY N/A 05/31/2019   Procedure: RIGHT/LEFT HEART CATH AND CORONARY ANGIOGRAPHY;  Surgeon: Martinique, Peter M, MD;  Location: [facility];  Service: Cardiovascular;   Laterality: N/A;  . TOTAL KNEE ARTHROPLASTY Left 05/07/2014   Procedure: LEFT TOTAL KNEE ARTHROPLASTY;  Surgeon: Mauri Pole, MD;  Location: [facility];  Service: Orthopedics;  Laterality: Left;  . TOTAL KNEE ARTHROPLASTY Right 06/05/2014   Procedure: RIGHT TOTAL KNEE ARTHROPLASTY;  Surgeon: Mauri Pole, MD;  Location: [facility];  Service: Orthopedics;  Laterality: Right;  . WISDOM TEETH EXTRACTIONS      Current Medications: Current Meds  Medication Sig  . acetaminophen (TYLENOL) 325 MG tablet Take 650 mg by mouth every 6 (six) hours as needed.  Marland Kitchen adapalene (DIFFERIN) 0.1 % gel Apply topically at bedtime.  Marland Kitchen amLODipine-benazepril (LOTREL) 5-20 MG capsule Take 0.5 capsules by mouth daily. Take 1/2 tablet (2.5-10mg  total) by mouth once daily.  Marland Kitchen amoxicillin (AMOXIL) 500 MG capsule Take 2,000 mg by mouth See admin instructions. Take 2,000 mg by mouth one hour prior to dental appointments  . Apple Cider Vinegar 600 MG CAPS Take 2 capsules by mouth daily.  Marland Kitchen aspirin EC 325 MG tablet Take 325 mg by mouth daily.   . Dapsone (ACZONE) 5 % topical gel Apply 1 application topically See admin instructions. Apply to the face daily as directed  . Flaxseed Oil (LINSEED OIL) OIL Take 1 capsule by mouth daily.   Marland Kitchen loratadine (CLARITIN) 10 MG tablet  Take 10 mg by mouth daily.  . Lutein-Zeaxanthin (OCUVITE LUTEIN 25 PO) Take 25 mg by mouth daily.  . methimazole (TAPAZOLE) 5 MG tablet Take two tabs daily  . Resveratrol 50 MG CAPS Take 75 mg by mouth daily.  Marland Kitchen selenium 200 MCG TABS tablet Take by mouth daily.  Marland Kitchen sulfamethoxazole-trimethoprim (BACTRIM DS) 800-160 MG per tablet Take 1 tablet by mouth daily.      Allergies:   Hydrochlorothiazide, Hydrocodone-acetaminophen, and Other   Social History   Socioeconomic History  . Marital status: Single    Spouse name: Not on file  . Number of children: Not on file  . Years of education: Not on file  . Highest education level: Not on file  Occupational History   . Not on file  Tobacco Use  . Smoking status: Never Smoker  . Smokeless tobacco: Never Used  Substance and Sexual Activity  . Alcohol use: No  . Drug use: No  . Sexual activity: Not on file  Other Topics Concern  . Not on file  Social History Narrative  . Not on file   Social Determinants of Health   Financial Resource Strain:   . Difficulty of Paying Living Expenses: Not on file  Food Insecurity:   . Worried About Charity fundraiser in the Last Year: Not on file  . Ran Out of Food in the Last Year: Not on file  Transportation Needs:   . Lack of Transportation (Medical): Not on file  . Lack of Transportation (Non-Medical): Not on file  Physical Activity:   . Days of Exercise per Week: Not on file  . Minutes of Exercise per Session: Not on file  Stress:   . Feeling of Stress : Not on file  Social Connections:   . Frequency of Communication with Friends and Family: Not on file  . Frequency of Social Gatherings with Friends and Family: Not on file  . Attends Religious Services: Not on file  . Active Member of Clubs or Organizations: Not on file  . Attends Archivist Meetings: Not on file  . Marital Status: Not on file     Family History: The patient's family history includes Diabetes in her brother and maternal aunt; Berenice Primas' disease in her sister; Hypothyroidism in her mother.  ROS:   Please see the history of present illness.    ROS  All other systems reviewed and negative.   EKGs/Labs/Other Studies Reviewed:    The following studies were reviewed today: none  EKG:  EKG is  ordered today.  The ekg ordered today demonstrates NSR with no ST changes and low voltage QRS  Recent Labs: 09/25/2019: Hemoglobin 13.6; Platelets 316.0 06/11/2020: BUN 15; Creatinine, Ser 0.99; Potassium 4.0; Sodium 135 07/15/2020: TSH 0.01   Recent Lipid Panel    Component Value Date/Time   CHOL 142 05/30/2019 1852   TRIG 42 05/30/2019 1852   HDL 55 05/30/2019 1852   CHOLHDL  2.6 05/30/2019 1852   VLDL 8 05/30/2019 1852   LDLCALC 79 05/30/2019 1852    Physical Exam:    VS:  BP 132/78 (BP Location: Left Arm, Patient Position: Sitting, Cuff Size: Large)   Pulse 73   Ht 5' (1.524 m)   Wt 203 lb 6.4 oz (92.3 kg)   BMI 39.72 kg/m     Wt Readings from Last 3 Encounters:  08/01/20 203 lb 6.4 oz (92.3 kg)  07/15/20 202 lb 12.8 oz (92 kg)  06/10/20 207 lb (93.9 kg)  GEN: Well nourished, well developed in no acute distress HEENT: Normal NECK: No JVD; No carotid bruits LYMPHATICS: No lymphadenopathy CARDIAC:RRR, no murmurs, rubs, gallops RESPIRATORY:  Clear to auscultation without rales, wheezing or rhonchi  ABDOMEN: Soft, non-tender, non-distended MUSCULOSKELETAL:  1+ LE edema; No deformity  SKIN: Warm and dry NEUROLOGIC:  Alert and oriented x 3 PSYCHIATRIC:  Normal affect    ASSESSMENT:    1. Chronic systolic CHF (congestive heart failure) ([place])   2. PAF (paroxysmal atrial fibrillation) ([place])   3. Essential hypertension    PLAN:    In order of problems listed above:  1. Chronic systolic CHF/NICM - cath 10/30/1914 with normal coronaries however reduced LV function on echocardiogram.   -she has some DOE but thinks it is related to an IV infusion she took this fall for thyroid eye disease as well as having had COVID 19.  She is also having some mild LE edema at times. -she has LE edema on exam today -start Lasix 20mg  daily -check BMET in 1 week -check BNP today -will repeat 2D echo to assess LVF (EF 45-50% on echo 2020) -creatinine 0.99 and K+ 4 in Sept 2021  2.  PAF -occurred in setting of Grave's dz but CHADS2VASC score is 4 -continues to maintain NSR with no palpitations -she is no longer on DOAC as her afib occurred in setting of hyperthyroidism -she has not had any further palpitations and has a [employer] that she uses and has not had any PAF -she stopped the BB on her own because it was causing her to have bradycardia  3.   HTN -BP controlled -she is not on any BP meds at this time -she stopped the Lopressor due to bradycardia   Medication Adjustments/Labs and Tests Ordered: Current medicines are reviewed at length with the patient today.  Concerns regarding medicines are outlined above.  Orders Placed This Encounter  Procedures  . EKG 12-Lead   No orders of the defined types were placed in this encounter.   Signed, Fransico Him, MD  08/01/2020 9:44 AM    [place]

## 2020-08-01 NOTE — Patient Instructions (Signed)
Medication Instructions:  Your physician has recommended you make the following change in your medication:  1) START taking Lasix (furosemide) 20 mg daily *If you need a refill on your cardiac medications before your next appointment, please call your pharmacy*   Lab Work: TODAY: BNP IN ONE WEEK: BMET  If you have labs (blood work) drawn today and your tests are completely normal, you will receive your results only by: Marland Kitchen MyChart Message (if you have MyChart) OR . A paper copy in the mail If you have any lab test that is abnormal or we need to change your treatment, we will call you to review the results.   Testing/Procedures: Your physician has requested that you have an echocardiogram. Echocardiography is a painless test that uses sound waves to create images of your heart. It provides your doctor with information about the size and shape of your heart and how well your heart's chambers and valves are working. This procedure takes approximately one hour. There are no restrictions for this procedure.  Follow-Up: At [facility], you and your health needs are our priority.  As part of our continuing mission to provide you with exceptional heart care, we have created designated Provider Care Teams.  These Care Teams include your primary Cardiologist (physician) and Advanced Practice Providers (APPs -  Physician Assistants and Nurse Practitioners) who all work together to provide you with the care you need, when you need it.  Your next appointment:   6 month(s)  The format for your next appointment:   In Person  Provider:   You may see Fransico Him, MD or one of the following Advanced Practice Providers on your designated Care Team:    Melina Copa, PA-C  Ermalinda Barrios, PA-C

## 2020-08-02 LAB — PRO B NATRIURETIC PEPTIDE: NT-Pro BNP: 131 pg/mL (ref 0–301)

## 2020-08-09 ENCOUNTER — Other Ambulatory Visit: Payer: Self-pay

## 2020-08-09 ENCOUNTER — Other Ambulatory Visit: Payer: Medicare Other | Admitting: *Deleted

## 2020-08-09 DIAGNOSIS — I5022 Chronic systolic (congestive) heart failure: Secondary | ICD-10-CM | POA: Diagnosis not present

## 2020-08-09 DIAGNOSIS — I10 Essential (primary) hypertension: Secondary | ICD-10-CM

## 2020-08-09 DIAGNOSIS — I480 Paroxysmal atrial fibrillation: Secondary | ICD-10-CM

## 2020-08-10 LAB — BASIC METABOLIC PANEL
BUN/Creatinine Ratio: 16 (ref 12–28)
BUN: 14 mg/dL (ref 8–27)
CO2: 24 mmol/L (ref 20–29)
Calcium: 9.4 mg/dL (ref 8.7–10.3)
Chloride: 100 mmol/L (ref 96–106)
Creatinine, Ser: 0.89 mg/dL (ref 0.57–1.00)
GFR calc Af Amer: 78 mL/min/{1.73_m2} (ref >59)
GFR calc non Af Amer: 68 mL/min/{1.73_m2} (ref >59)
Glucose: 99 mg/dL (ref 65–99)
Potassium: 4.3 mmol/L (ref 3.5–5.2)
Sodium: 138 mmol/L (ref 134–144)

## 2020-08-19 ENCOUNTER — Other Ambulatory Visit: Payer: Self-pay | Admitting: Endocrinology

## 2020-08-19 DIAGNOSIS — E0590 Thyrotoxicosis, unspecified without thyrotoxic crisis or storm: Secondary | ICD-10-CM

## 2020-08-19 NOTE — Addendum Note (Signed)
Addended by: Elayne Snare on: 08/19/2020 08:03 AM   Modules accepted: Orders

## 2020-08-22 ENCOUNTER — Other Ambulatory Visit (INDEPENDENT_AMBULATORY_CARE_PROVIDER_SITE_OTHER): Payer: Medicare Other

## 2020-08-22 DIAGNOSIS — E0590 Thyrotoxicosis, unspecified without thyrotoxic crisis or storm: Secondary | ICD-10-CM

## 2020-08-22 LAB — CBC WITH DIFFERENTIAL/PLATELET
Basophils Absolute: 0.1 10*3/uL (ref 0.0–0.1)
Basophils Relative: 1 % (ref 0.0–3.0)
Eosinophils Absolute: 0.2 10*3/uL (ref 0.0–0.7)
Eosinophils Relative: 3 % (ref 0.0–5.0)
HCT: 42.3 % (ref 36.0–46.0)
Hemoglobin: 14 g/dL (ref 12.0–15.0)
Lymphocytes Relative: 34.9 % (ref 12.0–46.0)
Lymphs Abs: 1.9 10*3/uL (ref 0.7–4.0)
MCHC: 33.1 g/dL (ref 30.0–36.0)
MCV: 92.8 fl (ref 78.0–100.0)
Monocytes Absolute: 0.5 10*3/uL (ref 0.1–1.0)
Monocytes Relative: 10.3 % (ref 3.0–12.0)
Neutro Abs: 2.7 10*3/uL (ref 1.4–7.7)
Neutrophils Relative %: 50.8 % (ref 43.0–77.0)
Platelets: 246 10*3/uL (ref 150.0–400.0)
RBC: 4.56 Mil/uL (ref 3.87–5.11)
RDW: 13.3 % (ref 11.5–15.5)
WBC: 5.3 10*3/uL (ref 4.0–10.5)

## 2020-08-22 LAB — TSH: TSH: 0.08 u[IU]/mL — ABNORMAL LOW (ref 0.35–4.50)

## 2020-08-22 LAB — T4, FREE: Free T4: 0.7 ng/dL (ref 0.60–1.60)

## 2020-08-22 LAB — ALT: ALT: 12 U/L (ref 0–35)

## 2020-08-23 ENCOUNTER — Ambulatory Visit (HOSPITAL_COMMUNITY): Payer: Medicare Other | Attending: Cardiovascular Disease

## 2020-08-23 ENCOUNTER — Other Ambulatory Visit: Payer: Self-pay

## 2020-08-23 DIAGNOSIS — I5022 Chronic systolic (congestive) heart failure: Secondary | ICD-10-CM | POA: Diagnosis not present

## 2020-08-23 DIAGNOSIS — I10 Essential (primary) hypertension: Secondary | ICD-10-CM | POA: Insufficient documentation

## 2020-08-23 DIAGNOSIS — I480 Paroxysmal atrial fibrillation: Secondary | ICD-10-CM | POA: Insufficient documentation

## 2020-08-23 LAB — ECHOCARDIOGRAM COMPLETE
AR max vel: 1.81 cm2
AV Area VTI: 1.96 cm2
AV Area mean vel: 1.99 cm2
AV Mean grad: 7 mmHg
AV Peak grad: 15.1 mmHg
Ao pk vel: 1.94 m/s
Area-P 1/2: 2.48 cm2
P 1/2 time: 1030 msec
S' Lateral: 2.8 cm

## 2020-08-23 LAB — SPECIMEN STATUS REPORT

## 2020-08-23 LAB — T3: T3, Total: 115 ng/dL (ref 71–180)

## 2020-08-23 MED ORDER — PERFLUTREN LIPID MICROSPHERE
1.0000 mL | INTRAVENOUS | Status: AC | PRN
  Administered 2020-08-23: 3 mL via INTRAVENOUS

## 2020-08-26 ENCOUNTER — Telehealth: Payer: Self-pay

## 2020-08-26 DIAGNOSIS — I712 Thoracic aortic aneurysm, without rupture, unspecified: Secondary | ICD-10-CM

## 2020-08-26 NOTE — Telephone Encounter (Signed)
-----   Message from Sueanne Margarita, MD sent at 08/23/2020  7:33 PM EST ----- Echo showed normal LVF with mildly dilated LV, mildly leaky AV and AV sclerosis and mildly dilated ascending aorta at 48mm.  Please get chest CTA to assess aorta further

## 2020-08-26 NOTE — Addendum Note (Signed)
Addended by: Antonieta Iba on: 08/26/2020 02:55 PM   Modules accepted: Orders

## 2020-08-26 NOTE — Telephone Encounter (Signed)
The patient has been notified of the result and verbalized understanding.  All questions (if any) were answered. Antonieta Iba, RN 08/26/2020 2:35 PM  Chest  CTA has been ordered.

## 2020-08-27 ENCOUNTER — Other Ambulatory Visit: Payer: Self-pay

## 2020-08-27 ENCOUNTER — Ambulatory Visit (INDEPENDENT_AMBULATORY_CARE_PROVIDER_SITE_OTHER): Payer: Medicare Other | Admitting: Endocrinology

## 2020-08-27 VITALS — BP 130/72 | HR 78 | Ht 61.5 in | Wt 202.0 lb

## 2020-08-27 DIAGNOSIS — E0590 Thyrotoxicosis, unspecified without thyrotoxic crisis or storm: Secondary | ICD-10-CM | POA: Diagnosis not present

## 2020-08-27 NOTE — Progress Notes (Signed)
Patient ID: Christina [name], female   DOB: July 16, 1951, 69 y.o.   MRN: [id number]                                                                                                               Reason for Appointment:  Hyperthyroidism, follow-up visit   Chief complaint: Follow-up of thyroid   History of Present Illness:   Prior history: For the last several years the patient has had symptoms of occasional palpitations, shortness of breath on exertion, shakiness and feeling somewhat warm.  However she did not have any recent worsening of the symptoms except on the morning of hospitalization She previously did not have any unusual weight loss, anxiety or fatigue She was also having some swelling of her legs prior to admission Since she was having difficulties with tachycardia and atrial fibrillation her thyroid level was checked in the hospital and indicated hyperthyroidism  RECENT HISTORY:  Baseline symptoms before treatment were occasional palpitations, shortness of breath on exertion, shakiness and feeling warm She has Graves' disease confirmed with a baseline thyrotropin receptor antibody level of 3.9  She has been treated with methimazole  With her TSH being low in 05/2020 her dose was increased back to 5 mg but in 10/21 this was increased to twice daily  However in the last couple of weeks she says she is starting to feel cold and have more fatigue  Thyroid levels show significantly lower T3 levels and also free T4 is low normal at 0.7  She has had a decreased appetite along with loss of taste Weight has leveled off  Last thyrotropin receptor antibody in 10/21 was up to 9.5, previously 2.3    Wt Readings from Last 3 Encounters:  08/27/20 202 lb (91.6 kg)  08/01/20 203 lb 6.4 oz (92.3 kg)  07/15/20 202 lb 12.8 oz (92 kg)   Thyroid function tests as follows:     Lab Results  Component Value Date   FREET4 0.70 08/22/2020   FREET4 0.95 07/15/2020   FREET4 1.19 06/10/2020    T3FREE 4.9 (H) 07/15/2020   T3FREE 2.8 01/04/2020   T3FREE 2.9 11/20/2019   TSH 0.08 (L) 08/22/2020   TSH 0.01 (L) 07/15/2020   TSH 0.02 (L) 06/10/2020    Lab Results  Component Value Date   THYROTRECAB 9.46 (H) 07/15/2020   THYROTRECAB 2.28 (H) 04/15/2020   THYROTRECAB 2.84 (H) 01/04/2020   OPHTHALMOPATHY:  Since about 11/20 she was having more swelling of her eyes, some double vision, and a pressure sensation Subsequently in December she had more fuzzy vision, feeling of fullness in her eyes and continued double vision She went to the urgent care center and was given a Medrol Dosepak  With the recurrence of the symptoms in 1/21 she was again started on tapering dose of prednisone Has been treated by oculoplastic ophthalmologist and she had been on Medrol high-dose weekly injections started in late February with only mild and short lasting relief  She has been able to complete the course of  Tepezza  infusions  She has had less pressure in her eyes with less restricted movement and no double vision Swelling of the eyes is resolving  CT scan of orbits has not shown any proptosis and only thickening of the eye muscles which has improved    Allergies as of 08/27/2020      Reactions   Hydrochlorothiazide Other (See Comments), Hypertension   Wildly fluctuates B/P low-to-high, then high-to-low, in a matter of minutes   Hydrocodone-acetaminophen Nausea And Vomiting   Other Nausea And Vomiting, Other (See Comments)   Stronger pain meds (post-op) caused dehydration and constipation      Medication List       Accurate as of August 27, 2020  1:01 PM. If you have any questions, ask your nurse or doctor.        acetaminophen 325 MG tablet Commonly known as: TYLENOL Take 650 mg by mouth every 6 (six) hours as needed.   Aczone 5 % topical gel Generic drug: Dapsone Apply 1 application topically See admin instructions. Apply to the face daily as directed   adapalene 0.1 %  gel Commonly known as: DIFFERIN Apply topically at bedtime.   amLODipine-benazepril 5-20 MG capsule Commonly known as: LOTREL Take 0.5 capsules by mouth daily. Take 1/2 tablet (2.5-10mg  total) by mouth once daily.   amoxicillin 500 MG capsule Commonly known as: AMOXIL Take 2,000 mg by mouth See admin instructions. Take 2,000 mg by mouth one hour prior to dental appointments   Apple Cider Vinegar 600 MG Caps Take 2 capsules by mouth daily.   aspirin EC 325 MG tablet Take 325 mg by mouth daily.   furosemide 20 MG tablet Commonly known as: LASIX Take 1 tablet (20 mg total) by mouth daily.   Linseed Oil Oil Take 1 capsule by mouth daily.   loratadine 10 MG tablet Commonly known as: CLARITIN Take 10 mg by mouth daily.   methimazole 5 MG tablet Commonly known as: TAPAZOLE Take two tabs daily   OCUVITE LUTEIN 25 PO Take 25 mg by mouth daily.   Resveratrol 50 MG Caps Take 75 mg by mouth daily.   selenium 200 MCG Tabs tablet Take by mouth daily.   sulfamethoxazole-trimethoprim 800-160 MG per tablet Commonly known as: BACTRIM DS Take 1 tablet by mouth daily.           Past Medical History:  Diagnosis Date  . 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 Asthma    hx of - PT STATES MILD - NO LONGER REQUIRES INHALERS  . Colitis    mild   . GERD (gastroesophageal reflux disease)   . H/O hiatal hernia   . Hypertension    hx of 10 years ago ; PT'S BLOOD PRESSURE RECENTLY ELEVATED WHILE IN HOSP FOR SURG- NOT ON ANY B/P MEDS  . Hyperthyroidism   . PAF (paroxysmal atrial fibrillation) ([facility])     Past Surgical History:  Procedure Laterality Date  . KNEE CLOSED REDUCTION Left 06/05/2014   Procedure: CLOSED MANIPULATION KNEE;  Surgeon: Mauri Pole, MD;  Location: [facility];  Service: Orthopedics;  Laterality: Left;  . RIGHT/LEFT HEART CATH AND CORONARY ANGIOGRAPHY N/A 05/31/2019   Procedure: RIGHT/LEFT HEART  CATH AND CORONARY ANGIOGRAPHY;  Surgeon: Martin[place], Peter M, MD;  Location: [facility];  Service: Cardiovascular;  Laterality: N/A;  . TOTAL KNEE ARTHROPLASTY Left 05/07/2014   Procedure: LEFT TOTAL KNEE  ARTHROPLASTY;  Surgeon: Mauri Pole, MD;  Location: [facility];  Service: Orthopedics;  Laterality: Left;  . TOTAL KNEE ARTHROPLASTY Right 06/05/2014   Procedure: RIGHT TOTAL KNEE ARTHROPLASTY;  Surgeon: Mauri Pole, MD;  Location: [facility];  Service: Orthopedics;  Laterality: Right;  . WISDOM TEETH EXTRACTIONS      Family History  Problem Relation Age of Onset  . Hypothyroidism Mother   . Graves' disease Sister   . Diabetes Brother   . Diabetes Maternal Aunt     Social History:  reports that she has never smoked. She has never used smokeless tobacco. She reports that she does not drink alcohol and does not use drugs.  Allergies:  Allergies  Allergen Reactions  . Hydrochlorothiazide Other (See Comments) and Hypertension    Wildly fluctuates B/P low-to-high, then high-to-low, in a matter of minutes  . Hydrocodone-Acetaminophen Nausea And Vomiting  . Other Nausea And Vomiting and Other (See Comments)    Stronger pain meds (post-op) caused dehydration and constipation     Review of Systems  Essential hypertension: Her PCP has started on Lotrel   BP Readings from Last 3 Encounters:  08/27/20 130/72  08/01/20 132/78  07/15/20 120/82      Examination:   BP 130/72   Pulse 78   Ht 5' 1.5" (1.562 m)   Wt 202 lb (91.6 kg)   SpO2 98%   BMI 37.55 kg/m    No significant swelling of the eyelids   No tremor Biceps reflexes appear normal    Assessment/Plan:   Hyperthyroidism, from Graves' disease with baseline thyrotropin receptor antibody of 3.9  She has been on methimazole since early September 2020   Hyperthyroidism has been controlled previously but more recently requiring higher doses of methimazole along with increased thyrotropin receptor antibody  level  Now with 10 mg of Tepezza likely her free T4 is low normal Also she feels more tired and cold  She will reduce her dose to 7.5 mg daily Discussed I-131 treatment which may be necessary if she has persistent hyperthyroidism However she is being scheduled for CT angiogram of her aorta  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 She has responded to [place] She may stop using selenium as this has not helped   Elayne Snare 08/27/2020, 1:01 PM    Note: This office note was prepared with Dragon voice recognition system technology. Any transcriptional errors that result from this process are unintentional.

## 2020-08-27 NOTE — Patient Instructions (Signed)
Take 1 1/2 pills daily of Methimazole

## 2020-09-05 DIAGNOSIS — H02531 Eyelid retraction right upper eyelid: Secondary | ICD-10-CM | POA: Diagnosis not present

## 2020-09-05 DIAGNOSIS — H40053 Ocular hypertension, bilateral: Secondary | ICD-10-CM | POA: Diagnosis not present

## 2020-09-05 DIAGNOSIS — H532 Diplopia: Secondary | ICD-10-CM | POA: Diagnosis not present

## 2020-09-05 DIAGNOSIS — H02534 Eyelid retraction left upper eyelid: Secondary | ICD-10-CM | POA: Diagnosis not present

## 2020-09-05 DIAGNOSIS — H04123 Dry eye syndrome of bilateral lacrimal glands: Secondary | ICD-10-CM | POA: Diagnosis not present

## 2020-09-05 DIAGNOSIS — E0500 Thyrotoxicosis with diffuse goiter without thyrotoxic crisis or storm: Secondary | ICD-10-CM | POA: Diagnosis not present

## 2020-09-18 ENCOUNTER — Other Ambulatory Visit: Payer: Self-pay

## 2020-09-18 DIAGNOSIS — I10 Essential (primary) hypertension: Secondary | ICD-10-CM

## 2020-09-26 ENCOUNTER — Other Ambulatory Visit: Payer: Self-pay

## 2020-09-26 ENCOUNTER — Ambulatory Visit (INDEPENDENT_AMBULATORY_CARE_PROVIDER_SITE_OTHER): Payer: Medicare Other | Admitting: Endocrinology

## 2020-09-26 ENCOUNTER — Other Ambulatory Visit: Payer: Medicare Other

## 2020-09-26 ENCOUNTER — Encounter: Payer: Self-pay | Admitting: Endocrinology

## 2020-09-26 VITALS — BP 128/80 | HR 79 | Ht 62.0 in | Wt 203.4 lb

## 2020-09-26 DIAGNOSIS — E0590 Thyrotoxicosis, unspecified without thyrotoxic crisis or storm: Secondary | ICD-10-CM | POA: Diagnosis not present

## 2020-09-26 DIAGNOSIS — I10 Essential (primary) hypertension: Secondary | ICD-10-CM | POA: Diagnosis not present

## 2020-09-26 LAB — TSH: TSH: 0.22 u[IU]/mL — ABNORMAL LOW (ref 0.35–4.50)

## 2020-09-26 LAB — T4, FREE: Free T4: 0.59 ng/dL — ABNORMAL LOW (ref 0.60–1.60)

## 2020-09-26 LAB — T3, FREE: T3, Free: 3.2 pg/mL (ref 2.3–4.2)

## 2020-09-26 NOTE — Progress Notes (Signed)
Patient ID: Christina [name], female   DOB: 1951-06-17, 70 y.o.   MRN: [id number]                                                                                                               Reason for Appointment:  Hyperthyroidism, follow-up visit   Chief complaint: Follow-up of thyroid   History of Present Illness:   Prior history: For the last several years the patient has had symptoms of occasional palpitations, shortness of breath on exertion, shakiness and feeling somewhat warm.  However she did not have any recent worsening of the symptoms except on the morning of hospitalization She previously did not have any unusual weight loss, anxiety or fatigue She was also having some swelling of her legs prior to admission Since she was having difficulties with tachycardia and atrial fibrillation her thyroid level was checked in the hospital and indicated hyperthyroidism  RECENT HISTORY:  Baseline symptoms before treatment were occasional palpitations, shortness of breath on exertion, shakiness and feeling warm She has Graves' disease confirmed with a baseline thyrotropin receptor antibody level of 3.9  She has been treated with methimazole  Her dose has fluctuated between 5 and 10 mg daily  In 12/21 she was complaining of fatigue and cold intolerance and since free T4 was low normal her dose was reduced to 7.5 mg She is taking 5 mg in the morning and half tablet in the evening regularly for the last month or so She does not have any unusual fatigue now and no significant cold intolerance  Thyroid levels pending Last TSH was still suppressed  She has had a decreased appetite along with loss of taste since last summer possibly from [place] Weight has leveled off  Last thyrotropin receptor antibody in 10/21 was up to 9.5, previously 2.3   Wt Readings from Last 3 Encounters:  09/26/20 203 lb 6.4 oz (92.3 kg)  08/27/20 202 lb (91.6 kg)  08/01/20 203 lb 6.4 oz (92.3 kg)   Thyroid  function tests as follows:     Lab Results  Component Value Date   FREET4 0.70 08/22/2020   FREET4 0.95 07/15/2020   FREET4 1.19 06/10/2020   T3FREE 4.9 (H) 07/15/2020   T3FREE 2.8 01/04/2020   T3FREE 2.9 11/20/2019   TSH 0.08 (L) 08/22/2020   TSH 0.01 (L) 07/15/2020   TSH 0.02 (L) 06/10/2020    Lab Results  Component Value Date   THYROTRECAB 9.46 (H) 07/15/2020   THYROTRECAB 2.28 (H) 04/15/2020   THYROTRECAB 2.84 (H) 01/04/2020   OPHTHALMOPATHY:  Since about 11/20 she was having more swelling of her eyes, some double vision, and a pressure sensation Subsequently in December she had more fuzzy vision, feeling of fullness in her eyes and continued double vision She went to the urgent care center and was given a Medrol Dosepak  With the recurrence of the symptoms in 1/21 she was again started on tapering dose of prednisone Has been treated by oculoplastic ophthalmologist and she had been on Medrol high-dose weekly  injections started in late February with only mild and short lasting relief  She had a complete complete the course of Tepezza  infusions  Not complaining of any significant swelling or pressure in her eyes and vision is back to normal She will be seeing her ophthalmologist now in 47-month intervals  CT scan of orbits has not shown any proptosis and only thickening of the eye muscles which has improved    Allergies as of 09/26/2020      Reactions   Hydrochlorothiazide Other (See Comments), Hypertension   Wildly fluctuates B/P low-to-high, then high-to-low, in a matter of minutes   Hydrocodone-acetaminophen Nausea And Vomiting   Other Nausea And Vomiting, Other (See Comments)   Stronger pain meds (post-op) caused dehydration and constipation      Medication List       Accurate as of September 26, 2020  2:47 PM. If you have any questions, ask your nurse or doctor.        acetaminophen 325 MG tablet Commonly known as: TYLENOL Take 650 mg by mouth every 6 (six)  hours as needed.   adapalene 0.1 % gel Commonly known as: DIFFERIN Apply topically at bedtime.   amLODipine-benazepril 5-20 MG capsule Commonly known as: LOTREL Take 0.5 capsules by mouth daily. Take 1/2 tablet (2.5-10mg  total) by mouth once daily.   amoxicillin 500 MG capsule Commonly known as: AMOXIL Take 2,000 mg by mouth See admin instructions. Take 2,000 mg by mouth one hour prior to dental appointments   Apple Cider Vinegar 600 MG Caps Take 2 capsules by mouth daily.   aspirin EC 325 MG tablet Take 325 mg by mouth daily.   Dapsone 5 % topical gel Apply 1 application topically See admin instructions. Apply to the face daily as directed   furosemide 20 MG tablet Commonly known as: LASIX Take 1 tablet (20 mg total) by mouth daily.   Linseed Oil Oil Take 1 capsule by mouth daily.   loratadine 10 MG tablet Commonly known as: CLARITIN Take 10 mg by mouth daily.   methimazole 5 MG tablet Commonly known as: TAPAZOLE Take two tabs daily   OCUVITE LUTEIN 25 PO Take 25 mg by mouth daily.   Resveratrol 50 MG Caps Take 75 mg by mouth daily.   selenium 200 MCG Tabs tablet Take by mouth daily.   sulfamethoxazole-trimethoprim 800-160 MG per tablet Commonly known as: BACTRIM DS Take 1 tablet by mouth daily.           Past Medical History:  Diagnosis Date  . 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 Asthma    hx of - PT STATES MILD - NO LONGER REQUIRES INHALERS  . Colitis    mild   . GERD (gastroesophageal reflux disease)   . H/O hiatal hernia   . Hypertension    hx of 10 years ago ; PT'S BLOOD PRESSURE RECENTLY ELEVATED WHILE IN HOSP FOR SURG- NOT ON ANY B/P MEDS  . Hyperthyroidism   . PAF (paroxysmal atrial fibrillation) ([facility])     Past Surgical History:  Procedure Laterality Date  . KNEE CLOSED REDUCTION Left 06/05/2014   Procedure: CLOSED MANIPULATION KNEE;  Surgeon: Shelda Pal, MD;   Location: [facility];  Service: Orthopedics;  Laterality: Left;  . RIGHT/LEFT HEART CATH AND CORONARY ANGIOGRAPHY N/A 05/31/2019   Procedure: RIGHT/LEFT HEART CATH AND CORONARY ANGIOGRAPHY;  Surgeon: [place], Peter M, MD;  Location:  [facility];  Service: Cardiovascular;  Laterality: N/A;  . TOTAL KNEE ARTHROPLASTY Left 05/07/2014   Procedure: LEFT TOTAL KNEE ARTHROPLASTY;  Surgeon: Mauri Pole, MD;  Location: [facility];  Service: Orthopedics;  Laterality: Left;  . TOTAL KNEE ARTHROPLASTY Right 06/05/2014   Procedure: RIGHT TOTAL KNEE ARTHROPLASTY;  Surgeon: Mauri Pole, MD;  Location: [facility];  Service: Orthopedics;  Laterality: Right;  . WISDOM TEETH EXTRACTIONS      Family History  Problem Relation Age of Onset  . Hypothyroidism Mother   . Graves' disease Sister   . Diabetes Brother   . Diabetes Maternal Aunt     Social History:  reports that she has never smoked. She has never used smokeless tobacco. She reports that she does not drink alcohol and does not use drugs.  Allergies:  Allergies  Allergen Reactions  . Hydrochlorothiazide Other (See Comments) and Hypertension    Wildly fluctuates B/P low-to-high, then high-to-low, in a matter of minutes  . Hydrocodone-Acetaminophen Nausea And Vomiting  . Other Nausea And Vomiting and Other (See Comments)    Stronger pain meds (post-op) caused dehydration and constipation     Review of Systems  Essential hypertension: Her PCP has had her on Lotrel   BP Readings from Last 3 Encounters:  09/26/20 128/80  08/27/20 130/72  08/01/20 132/78      Examination:   BP 128/80   Pulse 79   Ht 5\' 2"  (1.575 m)   Wt 203 lb 6.4 oz (92.3 kg)   SpO2 98%   BMI 37.20 kg/m   Mild facial puffiness present   Assessment/Plan:   Hyperthyroidism, from Graves' disease with baseline thyrotropin receptor antibody of 3.9  She has been on methimazole since early September 2020   Hyperthyroidism has been controlled previously but in late  2021 was requiring as much as 10 mg methimazole Now taking 7.5 total daily Subjectively doing well Looks euthyroid Labs to be checked today follow-up to be decided also Will also check thyrotropin receptor antibody that had increased last October   Elayne Snare 09/26/2020, 2:47 PM    Note: This office note was prepared with Dragon voice recognition system technology. Any transcriptional errors that result from this process are unintentional.

## 2020-09-27 LAB — THYROTROPIN RECEPTOR AUTOABS: Thyrotropin Receptor Ab: 11.2 IU/L — ABNORMAL HIGH (ref 0.00–1.75)

## 2020-09-27 LAB — BASIC METABOLIC PANEL
BUN/Creatinine Ratio: 16 (ref 12–28)
BUN: 15 mg/dL (ref 8–27)
CO2: 23 mmol/L (ref 20–29)
Calcium: 9.7 mg/dL (ref 8.7–10.3)
Chloride: 99 mmol/L (ref 96–106)
Creatinine, Ser: 0.91 mg/dL (ref 0.57–1.00)
GFR calc Af Amer: 76 mL/min/{1.73_m2} (ref >59)
GFR calc non Af Amer: 65 mL/min/{1.73_m2} (ref >59)
Glucose: 83 mg/dL (ref 65–99)
Potassium: 4.6 mmol/L (ref 3.5–5.2)
Sodium: 140 mmol/L (ref 134–144)

## 2020-10-08 ENCOUNTER — Ambulatory Visit
Admission: RE | Admit: 2020-10-08 | Discharge: 2020-10-08 | Disposition: A | Discharge status: Home / Self Care | Payer: Medicare Other | Source: Ambulatory Visit | Attending: Cardiology | Admitting: Cardiology

## 2020-10-08 DIAGNOSIS — I712 Thoracic aortic aneurysm, without rupture, unspecified: Secondary | ICD-10-CM

## 2020-10-08 DIAGNOSIS — J9811 Atelectasis: Secondary | ICD-10-CM | POA: Diagnosis not present

## 2020-10-08 IMAGING — CT CT ANGIO CHEST
2 of 6 series · 13 of 36 positions shown · IV contrast (iopamidol)
Comparison: CT abdomen pelvis [DATE] report without images. CT
angio chest [DATE]

CLINICAL DATA: Thoracic aorta aneurysm follow-up

EXAM:
CT ANGIOGRAPHY CHEST WITH CONTRAST
TECHNIQUE: Multidetector CT imaging of the chest was performed using the
standard protocol during bolus administration of intravenous
contrast. Multiplanar CT image reconstructions and MIPs were
obtained to evaluate the vascular anatomy.
CONTRAST:  75mL [CS] IOPAMIDOL ([CS]) INJECTION 76%

[Series 5: cta thorax 2.00 bv36 s3 axial arterial · axial · arterial · 0.66mm/px · z∈[+1531,+1769]mm · 12 of 141 slices shown]
[im 11/141  lung]
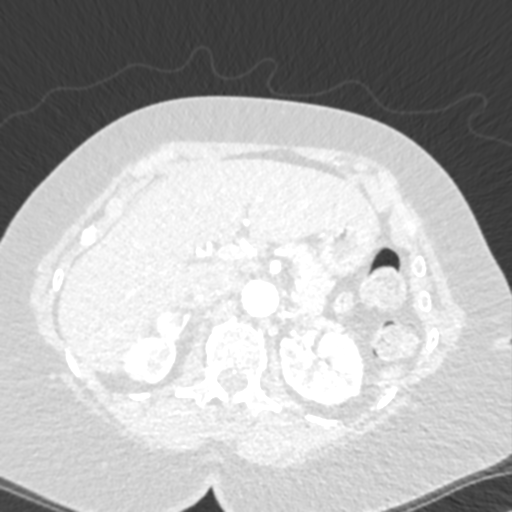
[im 22/141  mediastinal]
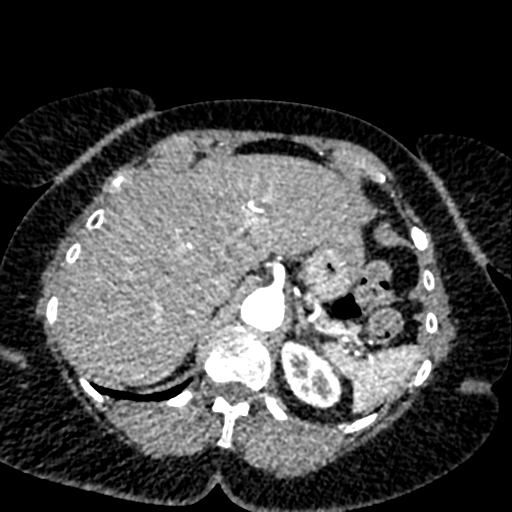
[im 33/141  lung]
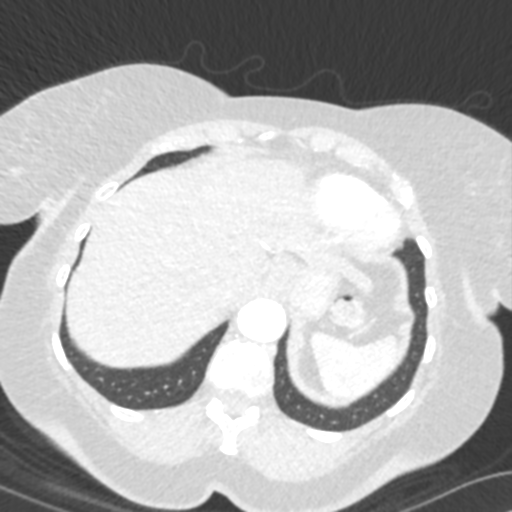
[im 44/141  mediastinal]
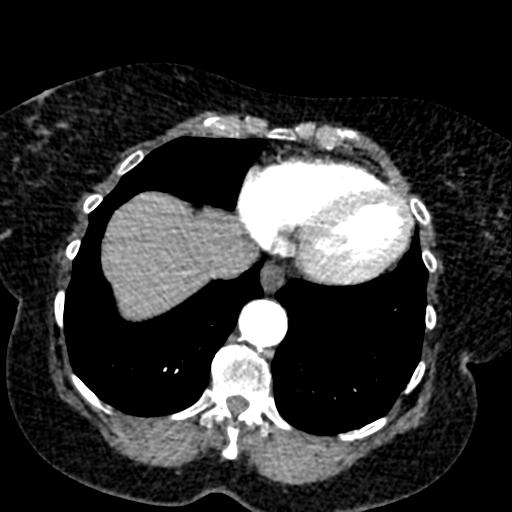
[im 54/141  lung]
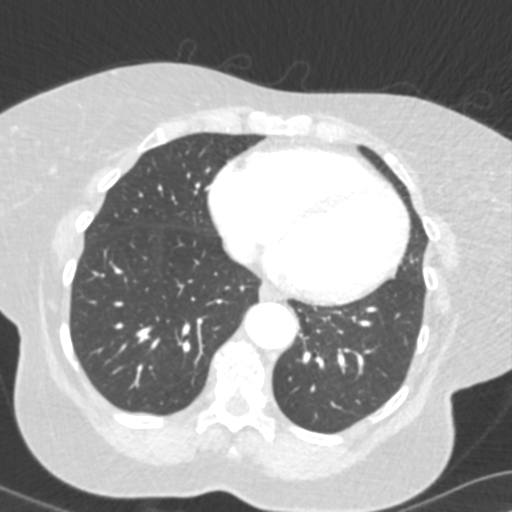
[im 65/141  mediastinal]
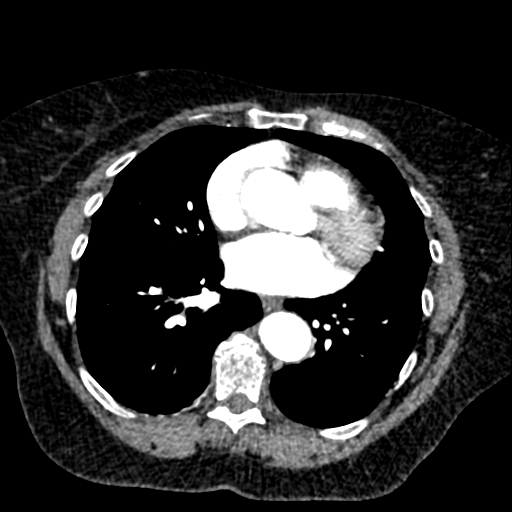
[im 76/141  lung]
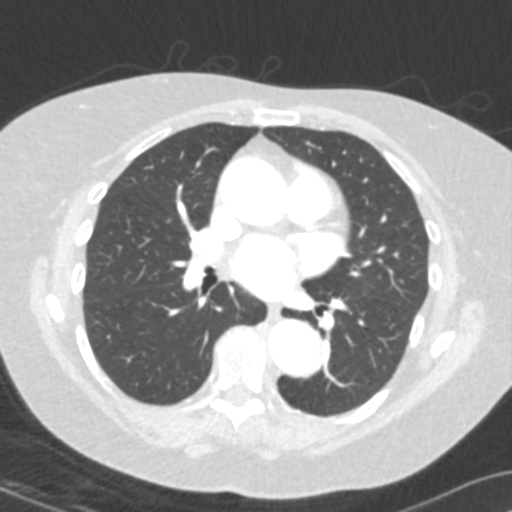
[im 87/141  mediastinal]
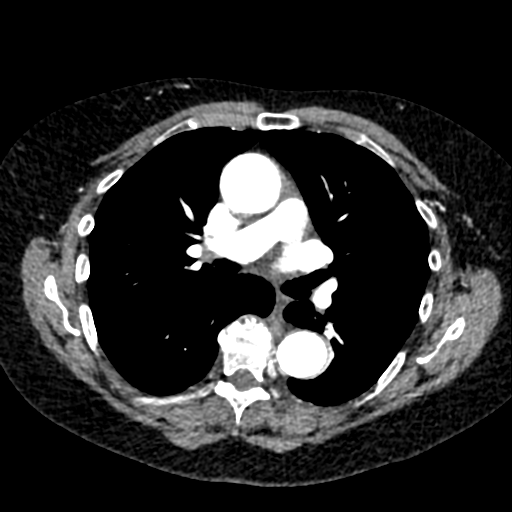
[im 97/141  lung]
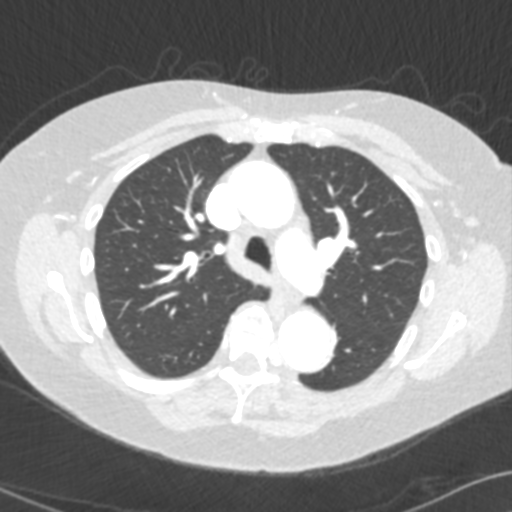
[im 108/141  mediastinal]
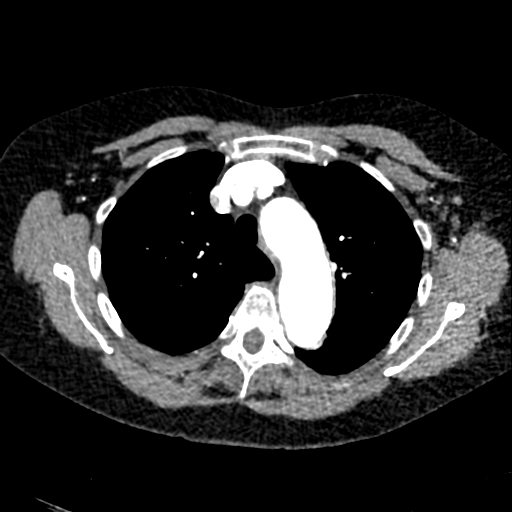
[im 119/141  lung]
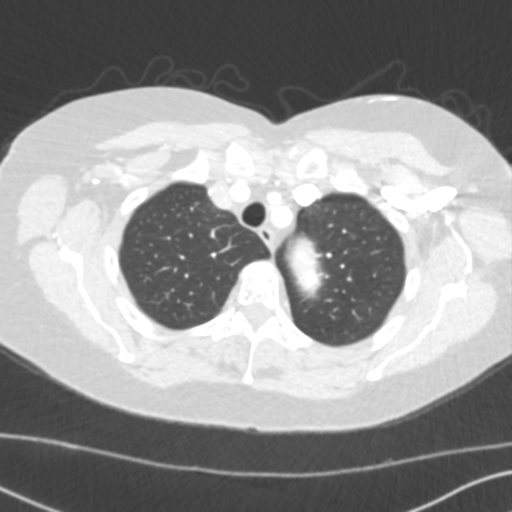
[im 130/141  mediastinal]
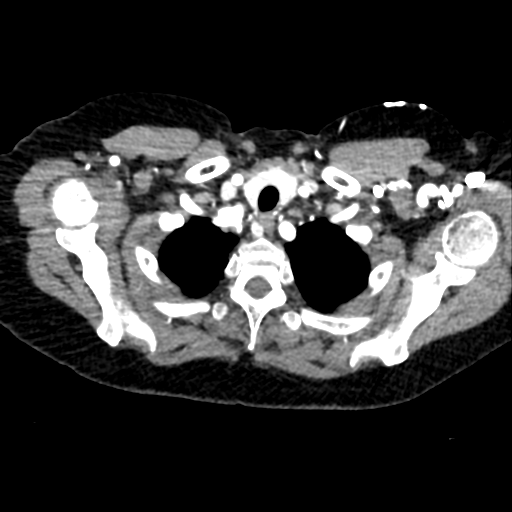

[Series 10: cta thorax 2.00 bv36 s3 cor st · coronal · 0.55mm/px · 1 of 168 slices shown]
[im 84/168  mediastinal]
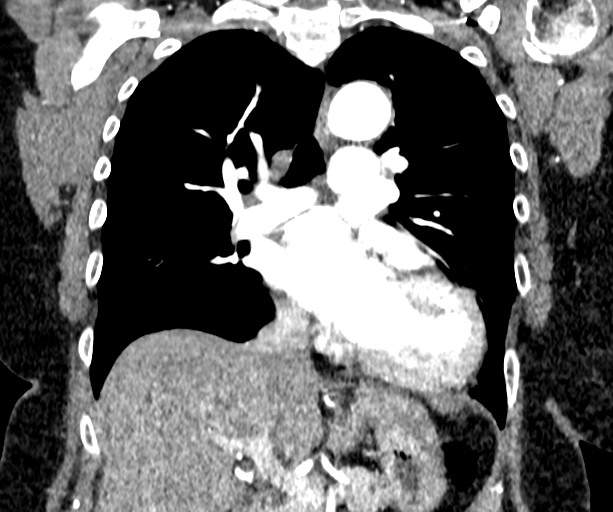

[13 of 36 positions shown; findings below may reference images not displayed]

FINDINGS: Cardiovascular: Preferential opacification of the thoracic aorta.
Stable borderline enlarged ascending aorta measuring up to 4 cm. No
enlarged descending thoracic aorta. No aortic dissection identified.
No pericardial effusion. The main pulmonary artery is normal in
caliber. No central or segmental pulmonary embolus.

Mediastinum/Nodes: No enlarged mediastinal, hilar, or axillary lymph
nodes. Thyroid gland, trachea, and esophagus demonstrate no
significant findings.

Lungs/Pleura: Atelectasis of the lingula. No focal consolidation. No
pulmonary nodule. No pulmonary mass. No pleural effusion. No
pneumothorax.

Upper Abdomen: More conspicuous partially visualized complex
appearing right superior pole renal lesion. Otherwise no other
abnormality.

Musculoskeletal:

No abdominal wall hernia or abnormality.

No suspicious lytic or blastic osseous lesions. No acute displaced
fracture. Multilevel degenerative changes of the spine.

Review of the MIP images confirms the above findings.
IMPRESSION: 1. Stable borderline aneurysmal ascending aorta (4 cm).
2. More conspicuous partially visualized complex appearing right
superior pole renal lesion of unclear etiology. Recommend MR or CT
renal protocol further evaluation.

These results will be called to the ordering clinician or
representative by the Radiologist Assistant, and communication
documented in the PACS or [REDACTED].

## 2020-10-08 MED ORDER — IOPAMIDOL (ISOVUE-370) INJECTION 76%
75.0000 mL | Freq: Once | INTRAVENOUS | Status: AC | PRN
  Administered 2020-10-08: 75 mL via INTRAVENOUS

## 2020-10-09 ENCOUNTER — Telehealth: Payer: Self-pay | Admitting: Cardiology

## 2020-10-09 ENCOUNTER — Encounter: Payer: Self-pay | Admitting: Cardiology

## 2020-10-09 DIAGNOSIS — I77819 Aortic ectasia, unspecified site: Secondary | ICD-10-CM | POA: Insufficient documentation

## 2020-10-09 NOTE — Telephone Encounter (Signed)
Please forward study to PCP and get her an appt with PCP to discuss this

## 2020-10-09 NOTE — Telephone Encounter (Signed)
Patient is aware of lab results.  Called and left a message with Dr. Laurelyn Sickle nurse to reach out to the patient for an appointment.

## 2020-10-09 NOTE — Telephone Encounter (Signed)
Spoke with Opal Sidles at GI.  Pt went for Chest CTA.  Incidental finding of possible renal lesion noted. Will route to Dr. Radford Pax and her nurse to make them aware.

## 2020-10-16 DIAGNOSIS — K529 Noninfective gastroenteritis and colitis, unspecified: Secondary | ICD-10-CM | POA: Diagnosis not present

## 2020-10-16 DIAGNOSIS — N289 Disorder of kidney and ureter, unspecified: Secondary | ICD-10-CM | POA: Diagnosis not present

## 2020-10-16 DIAGNOSIS — M170 Bilateral primary osteoarthritis of knee: Secondary | ICD-10-CM | POA: Diagnosis not present

## 2020-10-16 DIAGNOSIS — Z20828 Contact with and (suspected) exposure to other viral communicable diseases: Secondary | ICD-10-CM | POA: Diagnosis not present

## 2020-10-18 ENCOUNTER — Other Ambulatory Visit: Payer: Self-pay | Admitting: Family Medicine

## 2020-10-18 DIAGNOSIS — N289 Disorder of kidney and ureter, unspecified: Secondary | ICD-10-CM

## 2020-11-01 ENCOUNTER — Ambulatory Visit
Admission: RE | Admit: 2020-11-01 | Discharge: 2020-11-01 | Disposition: A | Discharge status: Home / Self Care | Payer: Medicare Other | Source: Ambulatory Visit | Attending: Family Medicine | Admitting: Family Medicine

## 2020-11-01 DIAGNOSIS — N2889 Other specified disorders of kidney and ureter: Secondary | ICD-10-CM | POA: Diagnosis not present

## 2020-11-01 DIAGNOSIS — N281 Cyst of kidney, acquired: Secondary | ICD-10-CM | POA: Diagnosis not present

## 2020-11-01 DIAGNOSIS — I700 Atherosclerosis of aorta: Secondary | ICD-10-CM | POA: Diagnosis not present

## 2020-11-01 DIAGNOSIS — N289 Disorder of kidney and ureter, unspecified: Secondary | ICD-10-CM

## 2020-11-01 IMAGING — CT CT ABD-PEL WO/W CM
3 of 14 series · 11 of 46 positions shown, 12 images · IV contrast (iopamidol)
Comparison: CT angiogram, [DATE]

CLINICAL DATA: Characterize right superior pole renal lesion

EXAM:
CT ABDOMEN AND PELVIS WITHOUT AND WITH CONTRAST
TECHNIQUE: Multidetector CT imaging of the abdomen and pelvis was performed
following the standard protocol before and following the bolus
administration of intravenous contrast.
CONTRAST:  80mL [74] IOPAMIDOL ([74]) INJECTION 76%

[Series 2: renal without 2.00 br40 s3 axial without · axial · non-contrast · 0.61mm/px · z∈[+1385,+1429]mm · 2 of 110 slices shown]
[im 22/110  soft-tissue]
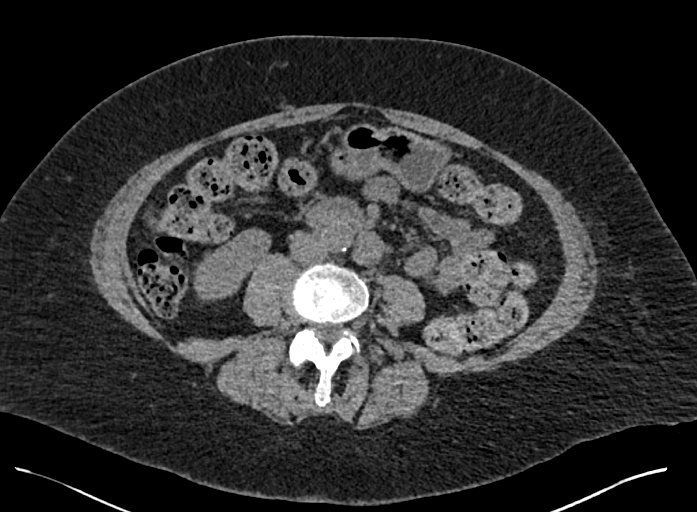
[im 44/110  soft-tissue]
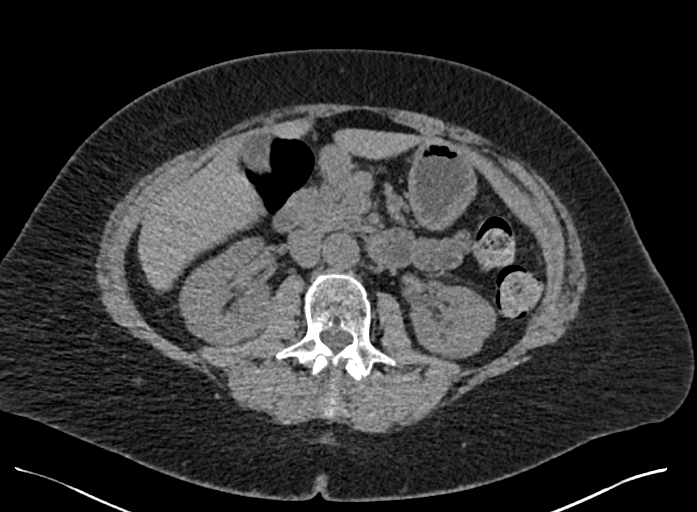

[Series 4: renal without 2.00 br40 s3 cor without · coronal · non-contrast · 0.43mm/px · 2 of 156 slices shown]
[im 52/156  soft-tissue]
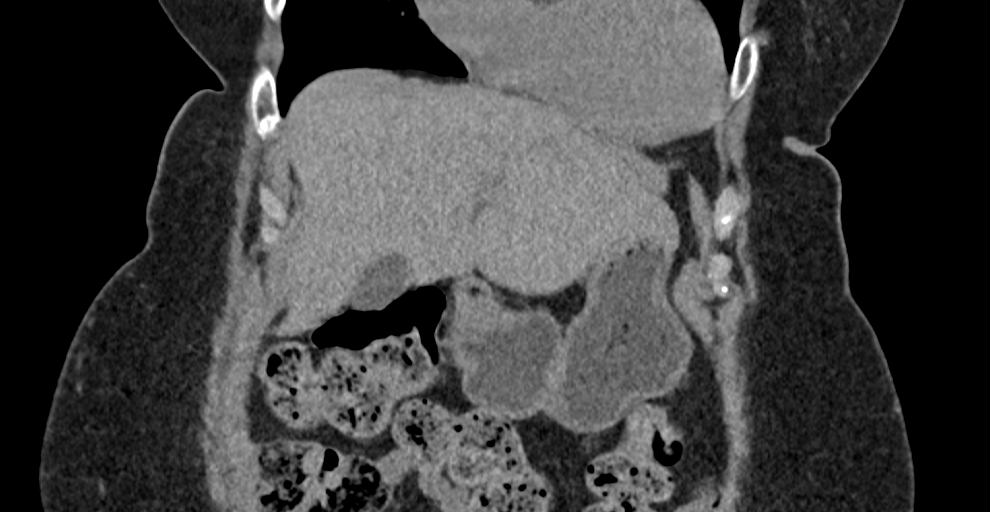
[im 104/156  soft-tissue]
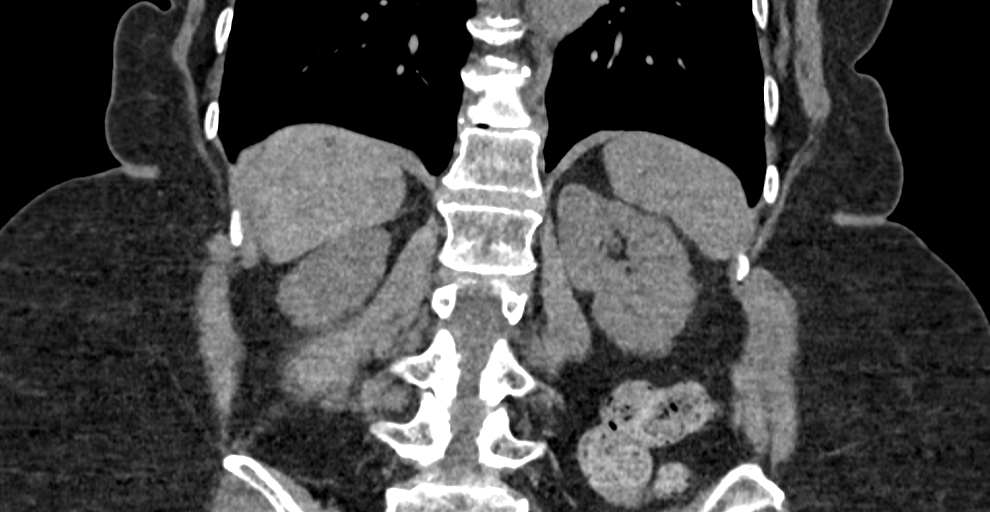

[Series 19: renal nephrographic 2.00 br36 s3 axial nephrograph · axial · 0.61mm/px · z∈[+1228,+1514]mm · 7 of 191 slices shown, 8 images]
[im 24/191  soft-tissue]
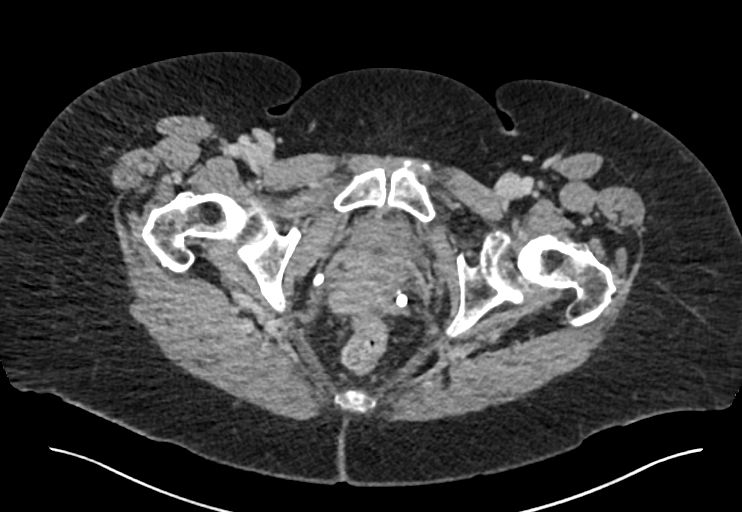
[im 24/191  bone]
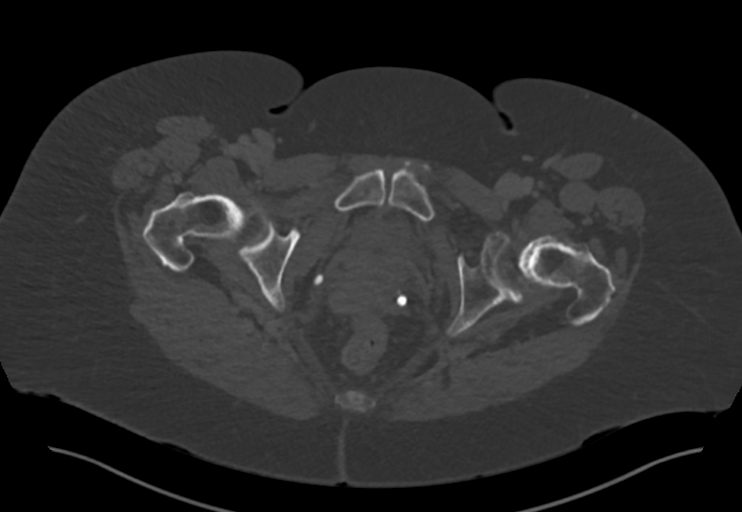
[im 48/191  soft-tissue]
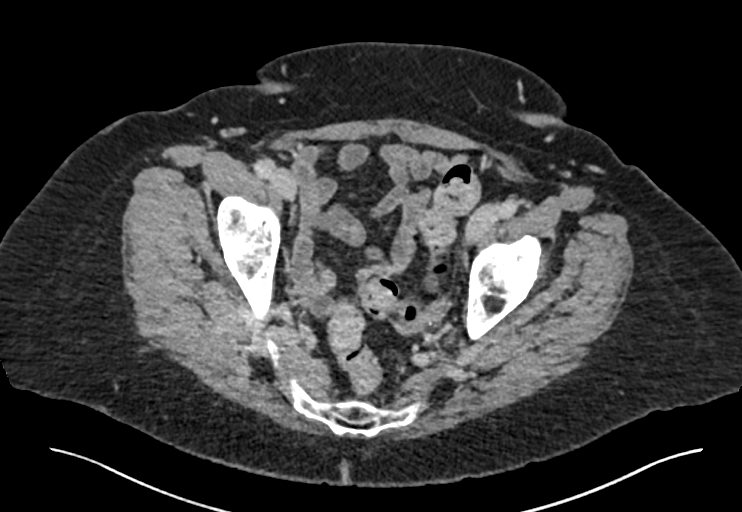
[im 72/191  soft-tissue]
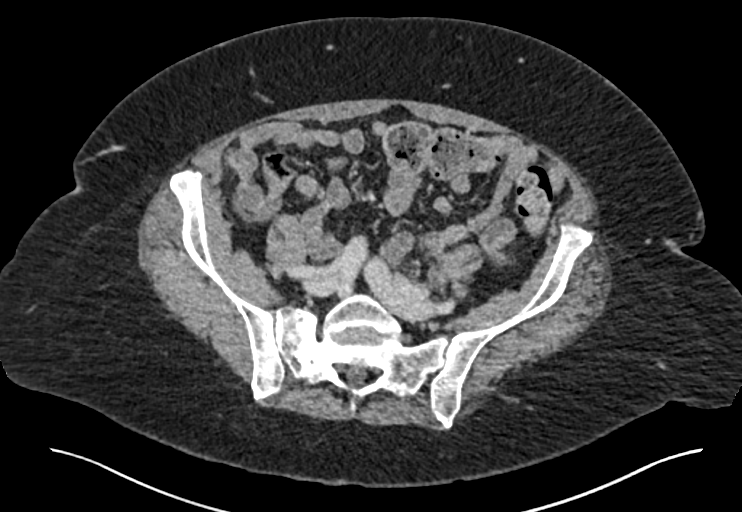
[im 96/191  soft-tissue]
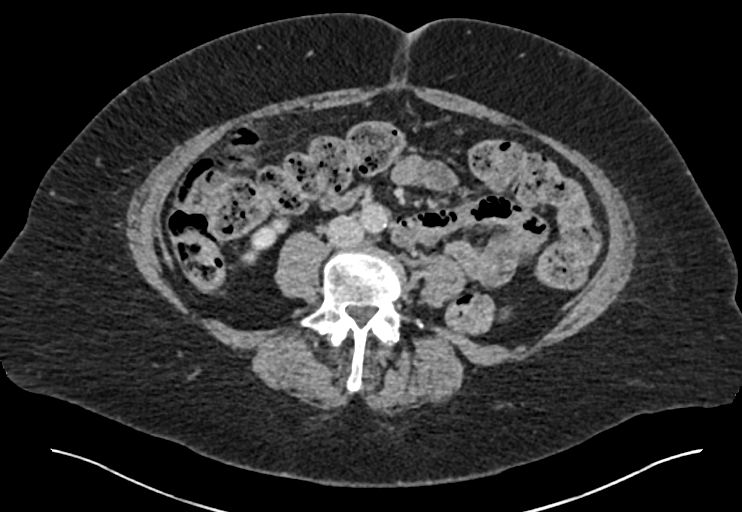
[im 119/191  soft-tissue]
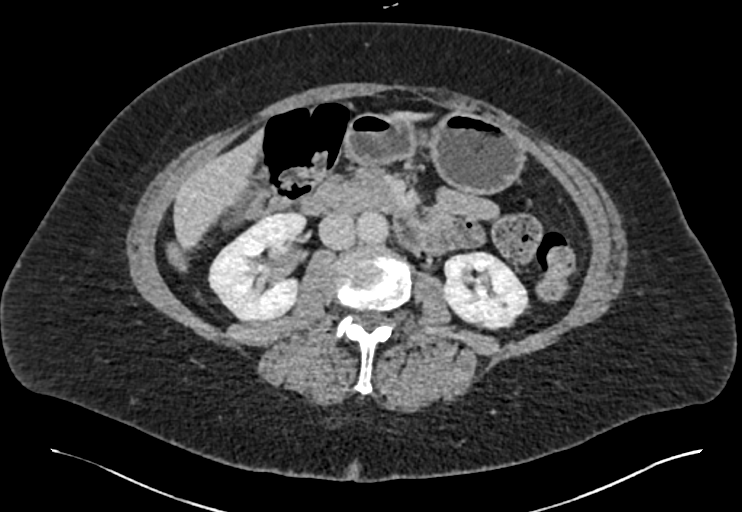
[im 143/191  soft-tissue]
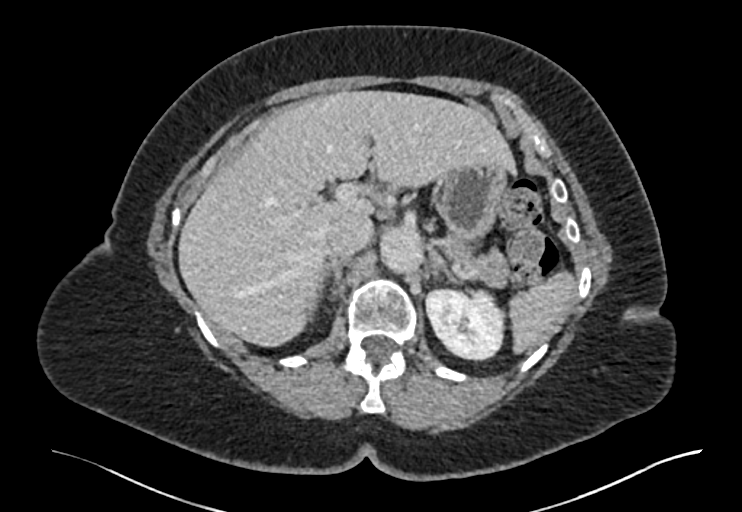
[im 167/191  soft-tissue]
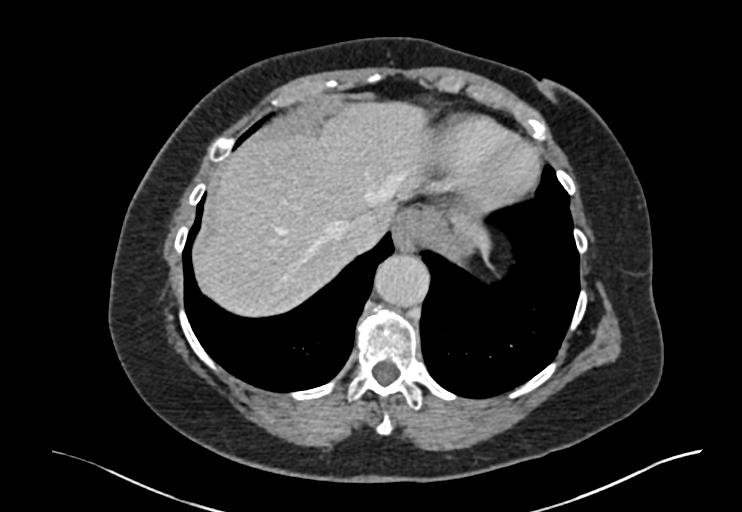

[11 of 46 positions shown; findings below may reference images not displayed]

FINDINGS: Lower chest: No acute abnormality.

Hepatobiliary: No solid liver abnormality is seen. No gallstones,
gallbladder wall thickening, or biliary dilatation.

Pancreas: Unremarkable. No pancreatic ductal dilatation or
surrounding inflammatory changes.

Spleen: Normal in size without significant abnormality.

Adrenals/Urinary Tract: Adrenal glands are unremarkable. Exophytic,
heterogeneously contrast enhancing mass of the superior pole of the
right kidney measuring 3.5 x 3.2 x 3.4 cm (series 10, image 57).
There is an additional small, heterogeneously contrast enhancing
mass of the lateral midportion of the left kidney measuring 1.2 x
1.2 x 1.1 cm (series 10, image 68). There is an additional,
significantly more subtle, although heterogeneously contrast
enhancing lesion of the posterior midportion of the left kidney
measuring 1.3 x 1.0 x 1.3 cm (series 19, image 70). Additional
benign cyst of the inferior pole of the right kidney. Bladder is
unremarkable.

Stomach/Bowel: Stomach is within normal limits. Appendix appears
normal. No evidence of bowel wall thickening, distention, or
inflammatory changes.

Vascular/Lymphatic: Aortic atherosclerosis. No enlarged abdominal or
pelvic lymph nodes.

Reproductive: No mass or other significant abnormality.

Other: No abdominal wall hernia or abnormality. No abdominopelvic
ascites.

Musculoskeletal: No acute or significant osseous findings.
IMPRESSION: 1. Exophytic, heterogeneously contrast enhancing mass of the
superior pole of the right kidney measuring 3.5 x 3.2 x 3.4 cm,
consistent with renal cell carcinoma.
2. There is an additional small, heterogeneously contrast enhancing
mass of the lateral midportion of the left kidney measuring 1.2 x
1.2 x 1.1 cm. There is an additional, significantly more subtle,
although heterogeneously contrast enhancing lesion of the posterior
midportion of the left kidney measuring 1.3 x 1.0 x 1.3 cm. These
are also consistent with small contralateral renal cell carcinomas.
3. No evidence of renal vein invasion, lymphadenopathy, or
metastatic disease in the abdomen or pelvis.
4. Aortic atherosclerosis.

Aortic Atherosclerosis ([74]-[74]).

## 2020-11-01 MED ORDER — IOPAMIDOL (ISOVUE-370) INJECTION 76%
80.0000 mL | Freq: Once | INTRAVENOUS | Status: AC | PRN
  Administered 2020-11-01: 80 mL via INTRAVENOUS

## 2020-11-06 DIAGNOSIS — H43813 Vitreous degeneration, bilateral: Secondary | ICD-10-CM | POA: Diagnosis not present

## 2020-11-06 DIAGNOSIS — H04123 Dry eye syndrome of bilateral lacrimal glands: Secondary | ICD-10-CM | POA: Diagnosis not present

## 2020-11-06 DIAGNOSIS — H40053 Ocular hypertension, bilateral: Secondary | ICD-10-CM | POA: Diagnosis not present

## 2020-11-06 DIAGNOSIS — E0500 Thyrotoxicosis with diffuse goiter without thyrotoxic crisis or storm: Secondary | ICD-10-CM | POA: Diagnosis not present

## 2020-11-08 DIAGNOSIS — L709 Acne, unspecified: Secondary | ICD-10-CM | POA: Diagnosis not present

## 2020-11-08 DIAGNOSIS — M159 Polyosteoarthritis, unspecified: Secondary | ICD-10-CM | POA: Diagnosis not present

## 2020-11-08 DIAGNOSIS — E0500 Thyrotoxicosis with diffuse goiter without thyrotoxic crisis or storm: Secondary | ICD-10-CM | POA: Diagnosis not present

## 2020-11-08 DIAGNOSIS — N2889 Other specified disorders of kidney and ureter: Secondary | ICD-10-CM | POA: Diagnosis not present

## 2020-11-14 ENCOUNTER — Other Ambulatory Visit (INDEPENDENT_AMBULATORY_CARE_PROVIDER_SITE_OTHER): Payer: Medicare Other

## 2020-11-14 DIAGNOSIS — E0590 Thyrotoxicosis, unspecified without thyrotoxic crisis or storm: Secondary | ICD-10-CM | POA: Diagnosis not present

## 2020-11-14 LAB — T3, FREE: T3, Free: 4.4 pg/mL — ABNORMAL HIGH (ref 2.3–4.2)

## 2020-11-15 ENCOUNTER — Other Ambulatory Visit (INDEPENDENT_AMBULATORY_CARE_PROVIDER_SITE_OTHER): Payer: Medicare Other

## 2020-11-15 ENCOUNTER — Other Ambulatory Visit: Payer: Self-pay | Admitting: Endocrinology

## 2020-11-15 ENCOUNTER — Other Ambulatory Visit: Payer: Self-pay

## 2020-11-15 DIAGNOSIS — E0590 Thyrotoxicosis, unspecified without thyrotoxic crisis or storm: Secondary | ICD-10-CM

## 2020-11-15 LAB — TSH: TSH: 0.04 u[IU]/mL — ABNORMAL LOW (ref 0.35–4.50)

## 2020-11-15 LAB — T4, FREE: Free T4: 1.03 ng/dL (ref 0.60–1.60)

## 2020-11-19 ENCOUNTER — Ambulatory Visit (INDEPENDENT_AMBULATORY_CARE_PROVIDER_SITE_OTHER): Payer: Medicare Other | Admitting: Endocrinology

## 2020-11-19 ENCOUNTER — Encounter: Payer: Self-pay | Admitting: Endocrinology

## 2020-11-19 ENCOUNTER — Other Ambulatory Visit: Payer: Self-pay

## 2020-11-19 VITALS — BP 132/78 | HR 74 | Ht 62.0 in | Wt 196.0 lb

## 2020-11-19 DIAGNOSIS — E0590 Thyrotoxicosis, unspecified without thyrotoxic crisis or storm: Secondary | ICD-10-CM

## 2020-11-19 NOTE — Progress Notes (Signed)
Patient ID: Christina [name], female   DOB: Sep 18, 1951, 70 y.o.   MRN: [id number]                                                                                                               Reason for Appointment:  Hyperthyroidism, follow-up visit   Chief complaint: Follow-up of thyroid   History of Present Illness:   Prior history: For the last several years the patient has had symptoms of occasional palpitations, shortness of breath on exertion, shakiness and feeling somewhat warm.  However she did not have any recent worsening of the symptoms except on the morning of hospitalization She previously did not have any unusual weight loss, anxiety or fatigue She was also having some swelling of her legs prior to admission Since she was having difficulties with tachycardia and atrial fibrillation her thyroid level was checked in the hospital and indicated hyperthyroidism  RECENT HISTORY:  Baseline symptoms before treatment were occasional palpitations, shortness of breath on exertion, shakiness and feeling warm She has Graves' disease confirmed with a baseline thyrotropin receptor antibody level of 3.9  She has been treated with methimazole  Her dose has fluctuated between 5 and 10 mg daily  She appears to be needing frequent adjustments of her doses In 1/22 she was complaining of feeling more fatigued and her free T4 was 0.59 This was with taking 7.5 mg methimazole daily   She is now taking the equivalent of 3.75 mg daily Although she is not complaining of any palpitations, heat intolerance, shakiness she has lost 7 pounds  Thyroid levels now show her free T3 to be high and free T4 relatively higher at 1.03  TSH still suppressed  She has had a decreased appetite along with loss of taste since last summer possibly from [place] thyrotropin receptor antibody in 1/22 was higher at 11.2 compared to 9.5   Wt Readings from Last 3 Encounters:  11/19/20 196 lb (88.9 kg)  09/26/20  203 lb 6.4 oz (92.3 kg)  08/27/20 202 lb (91.6 kg)   Thyroid function tests as follows:     Lab Results  Component Value Date   FREET4 1.03 11/15/2020   FREET4 0.59 (L) 09/26/2020   FREET4 0.70 08/22/2020   T3FREE 4.4 (H) 11/14/2020   T3FREE 3.2 09/26/2020   T3FREE 4.9 (H) 07/15/2020   TSH 0.04 (L) 11/15/2020   TSH 0.22 (L) 09/26/2020   TSH 0.08 (L) 08/22/2020    Lab Results  Component Value Date   THYROTRECAB 11.20 (H) 09/26/2020   THYROTRECAB 9.46 (H) 07/15/2020   THYROTRECAB 2.28 (H) 04/15/2020   OPHTHALMOPATHY:  Since about 11/20 she was having more swelling of her eyes, some double vision, and a pressure sensation Subsequently in December she had more fuzzy vision, feeling of fullness in her eyes and continued double vision She went to the urgent care center and was given a Medrol Dosepak  With the recurrence of the symptoms in 1/21 she was again started on tapering dose of prednisone Has  been treated by oculoplastic ophthalmologist and she had been on Medrol high-dose weekly injections started in late February with only mild and short lasting relief  She had a complete complete the course of Tepezza  infusions  Not complaining of any significant swelling or pressure in her eyes and vision is back to normal She will be seeing her ophthalmologist now in 26-month intervals  CT scan of orbits has not shown any proptosis and only thickening of the eye muscles which has improved    Allergies as of 11/19/2020      Reactions   Hydrochlorothiazide Other (See Comments), Hypertension   Wildly fluctuates B/P low-to-high, then high-to-low, in a matter of minutes   Hydrocodone-acetaminophen Nausea And Vomiting   Other Nausea And Vomiting, Other (See Comments)   Stronger pain meds (post-op) caused dehydration and constipation      Medication List       Accurate as of November 19, 2020  1:52 PM. If you have any questions, ask your nurse or doctor.        acetaminophen 325 MG  tablet Commonly known as: TYLENOL Take 650 mg by mouth every 6 (six) hours as needed.   adapalene 0.1 % gel Commonly known as: DIFFERIN Apply topically at bedtime.   amLODipine-benazepril 5-20 MG capsule Commonly known as: LOTREL Take 0.5 capsules by mouth daily. Take 1/2 tablet (2.5-10mg  total) by mouth once daily.   amoxicillin 500 MG capsule Commonly known as: AMOXIL Take 2,000 mg by mouth See admin instructions. Take 2,000 mg by mouth one hour prior to dental appointments   Apple Cider Vinegar 600 MG Caps Take 2 capsules by mouth daily.   aspirin EC 325 MG tablet Take 325 mg by mouth daily.   celecoxib 200 MG capsule Commonly known as: CELEBREX Take 200 mg by mouth 2 (two) times daily.   Dapsone 5 % topical gel Apply 1 application topically See admin instructions. Apply to the face daily as directed   furosemide 20 MG tablet Commonly known as: LASIX Take 1 tablet (20 mg total) by mouth daily.   Linseed Oil Oil Take 1 capsule by mouth daily.   loratadine 10 MG tablet Commonly known as: CLARITIN Take 10 mg by mouth daily.   methimazole 5 MG tablet Commonly known as: TAPAZOLE Take two tabs daily   OCUVITE LUTEIN 25 PO Take 25 mg by mouth daily.   Resveratrol 50 MG Caps Take 75 mg by mouth daily.   selenium 200 MCG Tabs tablet Take by mouth daily.   sulfamethoxazole-trimethoprim 800-160 MG per tablet Commonly known as: BACTRIM DS Take 1 tablet by mouth daily.           Past Medical History:  Diagnosis Date  . Acquired dilation of ascending aorta and aortic root (HCC)    4cm upper normal on Chest CT 09/2020  . 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 Asthma    hx of - PT STATES MILD - NO LONGER REQUIRES INHALERS  . Colitis    mild   . GERD (gastroesophageal reflux disease)   . H/O hiatal hernia   . Hypertension    hx of 10 years ago ; PT'S BLOOD PRESSURE RECENTLY ELEVATED WHILE IN  HOSP FOR SURG- NOT ON ANY B/P MEDS  . Hyperthyroidism   . PAF (paroxysmal atrial fibrillation) ([place])     Past Surgical History:  Procedure Laterality Date  . KNEE  CLOSED REDUCTION Left 06/05/2014   Procedure: CLOSED MANIPULATION KNEE;  Surgeon: Mauri Pole, MD;  Location: [facility];  Service: Orthopedics;  Laterality: Left;  . RIGHT/LEFT HEART CATH AND CORONARY ANGIOGRAPHY N/A 05/31/2019   Procedure: RIGHT/LEFT HEART CATH AND CORONARY ANGIOGRAPHY;  Surgeon: Martinique, Peter M, MD;  Location: [facility];  Service: Cardiovascular;  Laterality: N/A;  . TOTAL KNEE ARTHROPLASTY Left 05/07/2014   Procedure: LEFT TOTAL KNEE ARTHROPLASTY;  Surgeon: Mauri Pole, MD;  Location: [facility];  Service: Orthopedics;  Laterality: Left;  . TOTAL KNEE ARTHROPLASTY Right 06/05/2014   Procedure: RIGHT TOTAL KNEE ARTHROPLASTY;  Surgeon: Mauri Pole, MD;  Location: [facility];  Service: Orthopedics;  Laterality: Right;  . WISDOM TEETH EXTRACTIONS      Family History  Problem Relation Age of Onset  . Hypothyroidism Mother   . Graves' disease Sister   . Diabetes Brother   . Diabetes Maternal Aunt     Social History:  reports that she has never smoked. She has never used smokeless tobacco. She reports that she does not drink alcohol and does not use drugs.  Allergies:  Allergies  Allergen Reactions  . Hydrochlorothiazide Other (See Comments) and Hypertension    Wildly fluctuates B/P low-to-high, then high-to-low, in a matter of minutes  . Hydrocodone-Acetaminophen Nausea And Vomiting  . 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 and is waiting for urology consultation   Examination:   BP 132/78    Pulse 74   Ht 5\' 2"  (1.575 m)   Wt 196 lb (88.9 kg)   SpO2 99%   BMI 35.85 kg/m   No significant swelling of the eyes No tremor Has not unusually warm Biceps reflexes appear to be normal   Assessment/Plan:   Hyperthyroidism, from Graves' disease with baseline thyrotropin receptor antibody of 3.9  She has been on methimazole since early September 2020   Hyperthyroidism has been controlled previously but in late 2021 was requiring as much as 10 mg methimazole Now taking 3.75 mg on an average daily with her free T4 being low on her last visit She did not come back for follow-up last month as recommended  She has lost weight but not complaining of unusual fatigue Looks euthyroid  Since her free T3 is above normal she will go back up to 7.5 mg temporarily for 2 weeks and then 5 mg daily Follow-up in 6 weeks  Elayne Snare 11/19/2020, 1:52 PM    Note: This office note was prepared with Dragon voice recognition system technology. Any transcriptional errors that result from this process are unintentional.

## 2020-11-19 NOTE — Patient Instructions (Signed)
Take 1 1/2 thyroid pills for 2 weeks and then 1 pill daily

## 2020-11-26 DIAGNOSIS — D4101 Neoplasm of uncertain behavior of right kidney: Secondary | ICD-10-CM | POA: Diagnosis not present

## 2020-11-26 DIAGNOSIS — D4102 Neoplasm of uncertain behavior of left kidney: Secondary | ICD-10-CM | POA: Diagnosis not present

## 2020-12-05 DIAGNOSIS — H04123 Dry eye syndrome of bilateral lacrimal glands: Secondary | ICD-10-CM | POA: Diagnosis not present

## 2020-12-05 DIAGNOSIS — H02534 Eyelid retraction left upper eyelid: Secondary | ICD-10-CM | POA: Diagnosis not present

## 2020-12-05 DIAGNOSIS — H40053 Ocular hypertension, bilateral: Secondary | ICD-10-CM | POA: Diagnosis not present

## 2020-12-05 DIAGNOSIS — E0500 Thyrotoxicosis with diffuse goiter without thyrotoxic crisis or storm: Secondary | ICD-10-CM | POA: Diagnosis not present

## 2020-12-05 DIAGNOSIS — H532 Diplopia: Secondary | ICD-10-CM | POA: Diagnosis not present

## 2020-12-05 DIAGNOSIS — H02531 Eyelid retraction right upper eyelid: Secondary | ICD-10-CM | POA: Diagnosis not present

## 2020-12-09 DIAGNOSIS — D49511 Neoplasm of unspecified behavior of right kidney: Secondary | ICD-10-CM | POA: Diagnosis not present

## 2020-12-09 DIAGNOSIS — D49512 Neoplasm of unspecified behavior of left kidney: Secondary | ICD-10-CM | POA: Diagnosis not present

## 2020-12-16 ENCOUNTER — Telehealth: Payer: Self-pay | Admitting: Cardiology

## 2020-12-16 NOTE — Telephone Encounter (Signed)
Dr. Radford Pax  This patient is scheduled to undergo a radical nephrectomy>>appears to be found on the CT that you ordered for her aneurysm!! Thank goodness! Surgical team is asking to hold ASA 7 days prior however I cannot see a clear indication in her PMH for high dose ASA.   Can you comment on holding and send your response to the pre-op pool?   Thank you Sharee Pimple

## 2020-12-16 NOTE — Telephone Encounter (Signed)
No indication for ASA

## 2020-12-16 NOTE — Telephone Encounter (Signed)
   Primary Cardiologist: Fransico Him, MD  Chart reviewed as part of pre-operative protocol coverage. Given past medical history and time since last visit, based on ACC/AHA guidelines, Christina [name] would be at acceptable risk for the planned procedure without further cardiovascular testing.   She has a hx of chronic systolic CHF, normal coronary arteries per [facility], PAF no longer on anticoagulation, hyperthyroidism, asthma, GERD and HTN. She has been doing well from a CV standpoint with no complications.  There is no indication from a cardiac standpoint for her to be on ASA 325mg  therefore from our perspective it would be acceptable to hold however more investigation into why she is on high dose ASA may be indicated.   The patient was advised that if she develops new symptoms prior to surgery to contact our office to arrange for a follow-up visit, and she verbalized understanding.  I will route this recommendation to the requesting party via Epic fax function and remove from pre-op pool.  Please call with questions.  Kathyrn Drown, NP 12/16/2020, 2:58 PM

## 2020-12-16 NOTE — Telephone Encounter (Signed)
   [facility] Pre-operative Risk Assessment    HEARTCARE STAFF: - Please ensure there is not already an duplicate clearance open for this procedure. - Under Visit Info/Reason for Call, type in Other and utilize the format Clearance MM/DD/YY or Clearance TBD. Do not use dashes or single digits. - If request is for dental extraction, please clarify the # of teeth to be extracted.  Request for surgical clearance:  1. What type of surgery is being performed?  Right Robatic Radical Nephrectomy  2. When is this surgery scheduled? Pending 3.  4. What type of clearance is required (medical clearance vs. Pharmacy clearance to hold med vs. Both)? both  5. Are there any medications that need to be held prior to surgery and how long? Aspirin 325 mg- need to hold for 7 days prior to surgery  6. Practice name and name of physician performing surgery?    7. What is the office phone number? [phone]   7.   What is the office fax number? [phone]  8.   Anesthesia type (None, local, MAC, general) ?  General   Glyn Ade 12/16/2020, 10:36 AM  _________________________________________________________________   (provider comments below)

## 2020-12-24 ENCOUNTER — Other Ambulatory Visit: Payer: Self-pay | Admitting: Urology

## 2021-01-02 DIAGNOSIS — E0590 Thyrotoxicosis, unspecified without thyrotoxic crisis or storm: Secondary | ICD-10-CM | POA: Diagnosis not present

## 2021-01-07 ENCOUNTER — Ambulatory Visit (INDEPENDENT_AMBULATORY_CARE_PROVIDER_SITE_OTHER): Payer: Medicare Other | Admitting: Endocrinology

## 2021-01-07 ENCOUNTER — Other Ambulatory Visit: Payer: Self-pay

## 2021-01-07 ENCOUNTER — Encounter: Payer: Self-pay | Admitting: Endocrinology

## 2021-01-07 VITALS — BP 128/82 | HR 64 | Ht 62.0 in | Wt 196.6 lb

## 2021-01-07 DIAGNOSIS — E0590 Thyrotoxicosis, unspecified without thyrotoxic crisis or storm: Secondary | ICD-10-CM | POA: Diagnosis not present

## 2021-01-07 LAB — T3, FREE: T3, Free: 3.9 pg/mL (ref 2.3–4.2)

## 2021-01-07 NOTE — Progress Notes (Signed)
Patient ID: Christina [name], female   DOB: [birth year], 70 y.o.   MRN: [id number]                                                                                                               Reason for Appointment:  Hyperthyroidism, follow-up visit   Chief complaint: Follow-up of thyroid   History of Present Illness:   Prior history: For the last several years the patient has had symptoms of occasional palpitations, shortness of breath on exertion, shakiness and feeling somewhat warm.  However she did not have any recent worsening of the symptoms except on the morning of hospitalization She previously did not have any unusual weight loss, anxiety or fatigue She was also having some swelling of her legs prior to admission Since she was having difficulties with tachycardia and atrial fibrillation her thyroid level was checked in the hospital and indicated hyperthyroidism  RECENT HISTORY:  Baseline symptoms before treatment were occasional palpitations, shortness of breath on exertion, shakiness and feeling warm She has Graves' disease confirmed with a baseline thyrotropin receptor antibody level of 3.9  She has been treated with methimazole  Her dose has fluctuated between 5 and 10 mg daily  She appears to be needing frequent adjustments of her doses In 1/22 she was complaining of feeling more fatigued and her free T4 was 0.59 This was with taking 7.5 mg methimazole daily   She is now taking 5 mg daily and the dose was increased in 2/22 when free T3 was high Again she is not complaining of any palpitations, heat intolerance, shakiness She thinks she is less tired Weight is about the same  Thyroid levels done by PCP office did not have free T3 level and free thyroxine index is normal TSH still suppressed  She has had a decreased appetite along with loss of taste since last summer possibly from [place] thyrotropin receptor antibody in 1/22 was higher at 11.2 compared to  9.5   Wt Readings from Last 3 Encounters:  01/07/21 196 lb 9.6 oz (89.2 kg)  11/19/20 196 lb (88.9 kg)  09/26/20 203 lb 6.4 oz (92.3 kg)   Thyroid function tests as follows:     Lab Results  Component Value Date   FREET4 1.03 11/15/2020   FREET4 0.59 (L) 09/26/2020   FREET4 0.70 08/22/2020   T3FREE 4.4 (H) 11/14/2020   T3FREE 3.2 09/26/2020   T3FREE 4.9 (H) 07/15/2020   TSH 0.04 (L) 11/15/2020   TSH 0.22 (L) 09/26/2020   TSH 0.08 (L) 08/22/2020    Lab Results  Component Value Date   THYROTRECAB 11.20 (H) 09/26/2020   THYROTRECAB 9.46 (H) 07/15/2020   THYROTRECAB 2.28 (H) 04/15/2020   OPHTHALMOPATHY:  Since about 11/20 she was having more swelling of her eyes, some double vision, and a pressure sensation Subsequently in December she had more fuzzy vision, feeling of fullness in her eyes and continued double vision She went to the urgent care center and was given a Medrol Dosepak  With  the recurrence of the symptoms in 1/21 she was again started on tapering dose of prednisone Has been treated by oculoplastic ophthalmologist and she had been on Medrol high-dose weekly injections started in late February with only mild and short lasting relief  She had a complete the course of Tepezza  infusions  She thinks her eyes are practically back to normal She will be seeing her ophthalmologist now in 66-month intervals  CT scan of orbits has not shown any proptosis and only thickening of the eye muscles which has improved    Allergies as of 01/07/2021      Reactions   Hydrochlorothiazide Other (See Comments), Hypertension   Wildly fluctuates B/P low-to-high, then high-to-low, in a matter of minutes   Hydrocodone-acetaminophen Nausea And Vomiting   Other Nausea And Vomiting, Other (See Comments)   Stronger pain meds (post-op) caused dehydration and constipation      Medication List       Accurate as of January 07, 2021  2:04 PM. If you have any questions, ask your nurse or  doctor.        acetaminophen 325 MG tablet Commonly known as: TYLENOL Take 650 mg by mouth every 6 (six) hours as needed.   adapalene 0.1 % gel Commonly known as: DIFFERIN Apply topically at bedtime.   amLODipine-benazepril 5-20 MG capsule Commonly known as: LOTREL Take 0.5 capsules by mouth daily. Take 1/2 tablet (2.5-10mg  total) by mouth once daily.   amoxicillin 500 MG capsule Commonly known as: AMOXIL Take 2,000 mg by mouth See admin instructions. Take 2,000 mg by mouth one hour prior to dental appointments   Apple Cider Vinegar 600 MG Caps Take 2 capsules by mouth daily.   aspirin EC 325 MG tablet Take 325 mg by mouth daily.   celecoxib 200 MG capsule Commonly known as: CELEBREX Take 200 mg by mouth 2 (two) times daily.   Dapsone 5 % topical gel Apply 1 application topically See admin instructions. Apply to the face daily as directed   furosemide 20 MG tablet Commonly known as: LASIX Take 1 tablet (20 mg total) by mouth daily.   Linseed Oil Oil Take 1 capsule by mouth daily.   loratadine 10 MG tablet Commonly known as: CLARITIN Take 10 mg by mouth daily.   methimazole 5 MG tablet Commonly known as: TAPAZOLE Take two tabs daily What changed:   how much to take  how to take this  when to take this  additional instructions   OCUVITE LUTEIN 25 PO Take 25 mg by mouth daily.   Resveratrol 50 MG Caps Take 75 mg by mouth daily.   selenium 200 MCG Tabs tablet Take by mouth daily.   sulfamethoxazole-trimethoprim 800-160 MG per tablet Commonly known as: BACTRIM DS Take 1 tablet by mouth daily.           Past Medical History:  Diagnosis Date  . Acquired dilation of ascending aorta and aortic root (HCC)    4cm upper normal on Chest CT 09/2020  . 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 Asthma    hx of - PT STATES MILD - NO LONGER REQUIRES INHALERS  . Colitis    mild   . GERD  (gastroesophageal reflux disease)   . H/O hiatal hernia   . Hypertension    hx of 10 years ago ; PT'S BLOOD PRESSURE RECENTLY ELEVATED WHILE IN HOSP FOR SURG- NOT  ON ANY B/P MEDS  . Hyperthyroidism   . PAF (paroxysmal atrial fibrillation) ([place])     Past Surgical History:  Procedure Laterality Date  . KNEE CLOSED REDUCTION Left 06/05/2014   Procedure: CLOSED MANIPULATION KNEE;  Surgeon: Mauri Pole, MD;  Location: [facility];  Service: Orthopedics;  Laterality: Left;  . RIGHT/LEFT HEART CATH AND CORONARY ANGIOGRAPHY N/A 05/31/2019   Procedure: RIGHT/LEFT HEART CATH AND CORONARY ANGIOGRAPHY;  Surgeon: Martinique, Peter M, MD;  Location: [place] [facility];  Service: Cardiovascular;  Laterality: N/A;  . TOTAL KNEE ARTHROPLASTY Left 05/07/2014   Procedure: LEFT TOTAL KNEE ARTHROPLASTY;  Surgeon: Mauri Pole, MD;  Location: [facility];  Service: Orthopedics;  Laterality: Left;  . TOTAL KNEE ARTHROPLASTY Right 06/05/2014   Procedure: RIGHT TOTAL KNEE ARTHROPLASTY;  Surgeon: Mauri Pole, MD;  Location: [facility];  Service: Orthopedics;  Laterality: Right;  . WISDOM TEETH EXTRACTIONS      Family History  Problem Relation Age of Onset  . Hypothyroidism Mother   . Graves' disease Sister   . Diabetes Brother   . Diabetes Maternal Aunt     Social History:  reports that she has never smoked. She has never used smokeless tobacco. She reports that she does not drink alcohol and does not use drugs.  Allergies:  Allergies  Allergen Reactions  . Hydrochlorothiazide Other (See Comments) and Hypertension    Wildly fluctuates B/P low-to-high, then high-to-low, in a matter of minutes  . Hydrocodone-Acetaminophen Nausea And Vomiting  . Other Nausea And Vomiting and Other (See Comments)    Stronger pain meds (post-op) caused dehydration and constipation     Review of Systems  Essential hypertension: Her PCP has had her on Lotrel   BP Readings from Last 3 Encounters:  01/07/21 128/82  11/19/20 132/78   09/26/20 128/80       Examination:   BP 128/82   Pulse 64   Ht 5\' 2"  (1.575 m)   Wt 196 lb 9.6 oz (89.2 kg)   SpO2 99%   BMI 35.96 kg/m   No proptosis or lid retraction seen   Assessment/Plan:   Hyperthyroidism, from Graves' disease with baseline thyrotropin receptor antibody of 3.9  She has been on methimazole since early September 2020   She has had frequent adjustments of her methimazole dose although usually between 5 to 10 mg a day only Last thyrotropin receptor antibody was still high No history of goiter previously  Subjectively doing well  Although her TSH is low free T4 index is normal but free T3 labs not available and will be drawn today Medication to be adjusted based on her T3 level  Likely needs long-term methimazole treatment but may also consider thyroidectomy This will need to be deferred since she is scheduled for nephrectomy in June  Follow-up in 6 weeks  Aundre Hietala 01/07/2021, 2:04 PM    Note: This office note was prepared with Dragon voice recognition system technology. Any transcriptional errors that result from this process are unintentional.

## 2021-01-31 ENCOUNTER — Ambulatory Visit (INDEPENDENT_AMBULATORY_CARE_PROVIDER_SITE_OTHER): Payer: Medicare Other | Admitting: Cardiology

## 2021-01-31 ENCOUNTER — Encounter: Payer: Self-pay | Admitting: Cardiology

## 2021-01-31 ENCOUNTER — Other Ambulatory Visit: Payer: Self-pay

## 2021-01-31 VITALS — BP 120/74 | HR 82 | Ht 62.0 in | Wt 196.8 lb

## 2021-01-31 DIAGNOSIS — I480 Paroxysmal atrial fibrillation: Secondary | ICD-10-CM

## 2021-01-31 DIAGNOSIS — I10 Essential (primary) hypertension: Secondary | ICD-10-CM

## 2021-01-31 DIAGNOSIS — I5032 Chronic diastolic (congestive) heart failure: Secondary | ICD-10-CM | POA: Diagnosis not present

## 2021-01-31 DIAGNOSIS — Z01818 Encounter for other preprocedural examination: Secondary | ICD-10-CM

## 2021-01-31 DIAGNOSIS — Z01810 Encounter for preprocedural cardiovascular examination: Secondary | ICD-10-CM | POA: Diagnosis not present

## 2021-01-31 MED ORDER — FUROSEMIDE 20 MG PO TABS: 20.0000 mg | ORAL_TABLET | Freq: Every day | ORAL | 3 refills | Status: DC

## 2021-01-31 MED ORDER — AMLODIPINE BESY-BENAZEPRIL HCL 5-20 MG PO CAPS: ORAL_CAPSULE | ORAL | 3 refills | Status: DC

## 2021-01-31 NOTE — Progress Notes (Signed)
Cardiology Office Note:    Date:  01/31/2021   ID:  Christina [name], DOB March 07, 1951, MRN [id number]  PCP:  Mateo Flow, MD  Cardiologist:  Fransico Him, MD    Referring MD: Mateo Flow, MD   Chief Complaint  Patient presents with  . Congestive Heart Failure  . Hypertension  . Atrial Fibrillation    History of Present Illness:    Christina [name] is a 70 y.o. female with a hx of chronic systolic CHF, normal coronary arteries, PAF, hyperthyroidism, asthma, GERD and HTN.  She still has problems with hyperthyroidism and sees Endocrine and is on Tapazole. She had some problems with elevated BPs and SOB after getting an infusion of drug for her thyroid eye disease .    She is here today for followup of her CHF, PAF and HTN and is doing well.  She denies any chest pain or pressure, PND, orthopnea, dizziness or syncope. She has chronic LE edema and DOE which she thinks are stable and unchanged from when I saw her last.  Occasionally she will notice her heart racing when she is working out in the yard. She used her Kardia mobile device and collected a strip which I reviewed and shows NSR with a lot of wandering baseline artifact. She is compliant with her meds and is tolerating meds with no SE.    Past Medical History:  Diagnosis Date  . Acquired dilation of ascending aorta and aortic root (HCC)    4cm upper normal on Chest CT 09/2020  . 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 Asthma    hx of - PT STATES MILD - NO LONGER REQUIRES INHALERS  . Colitis    mild   . GERD (gastroesophageal reflux disease)   . H/O hiatal hernia   . Hypertension    hx of 10 years ago ; PT'S BLOOD PRESSURE RECENTLY ELEVATED WHILE IN HOSP FOR SURG- NOT ON ANY B/P MEDS  . Hyperthyroidism   . PAF (paroxysmal atrial fibrillation) ([place])     Past Surgical History:  Procedure Laterality Date  . KNEE CLOSED REDUCTION Left 06/05/2014   Procedure: CLOSED  MANIPULATION KNEE;  Surgeon: Mauri Pole, MD;  Location: [facility];  Service: Orthopedics;  Laterality: Left;  . RIGHT/LEFT HEART CATH AND CORONARY ANGIOGRAPHY N/A 05/31/2019   Procedure: RIGHT/LEFT HEART CATH AND CORONARY ANGIOGRAPHY;  Surgeon: Martinique, Peter M, MD;  Location: [facility];  Service: Cardiovascular;  Laterality: N/A;  . TOTAL KNEE ARTHROPLASTY Left 05/07/2014   Procedure: LEFT TOTAL KNEE ARTHROPLASTY;  Surgeon: Mauri Pole, MD;  Location: [facility];  Service: Orthopedics;  Laterality: Left;  . TOTAL KNEE ARTHROPLASTY Right 06/05/2014   Procedure: RIGHT TOTAL KNEE ARTHROPLASTY;  Surgeon: Mauri Pole, MD;  Location: [facility];  Service: Orthopedics;  Laterality: Right;  . WISDOM TEETH EXTRACTIONS      Current Medications: Current Meds  Medication Sig  . acetaminophen (TYLENOL) 650 MG CR tablet Take 650 mg by mouth daily.  Marland Kitchen adapalene (DIFFERIN) 0.1 % gel Apply topically at bedtime.  Marland Kitchen amLODipine-benazepril (LOTREL) 5-20 MG capsule Take 0.5 capsules by mouth daily. Take 1/2 tablet (2.5-10mg  total) by mouth once daily.  Marland Kitchen amoxicillin (AMOXIL) 500 MG capsule Take 2,000 mg by mouth See admin instructions. Take 2,000 mg by mouth one hour prior to dental appointments  . Apple Cider Vinegar 600 MG CAPS Take  600 mg by mouth daily.  Marland Kitchen aspirin EC 325 MG tablet Take 325 mg by mouth daily.   . celecoxib (CELEBREX) 200 MG capsule Take 200 mg by mouth daily.  . Dapsone 5 % topical gel Apply 1 application topically See admin instructions. Apply to the face daily as directed  . Flaxseed Oil (LINSEED OIL) OIL Take 1 capsule by mouth daily.   . furosemide (LASIX) 20 MG tablet Take 1 tablet (20 mg total) by mouth daily.  Marland Kitchen loratadine (CLARITIN) 10 MG tablet Take 10 mg by mouth daily.  . Lutein-Zeaxanthin (OCUVITE LUTEIN 25 PO) Take 25 mg by mouth daily.  . methimazole (TAPAZOLE) 5 MG tablet Take 5 mg by mouth daily.  Marland Kitchen Resveratrol 50 MG CAPS Take 75 mg by mouth daily.     Allergies:    Hydrochlorothiazide, Hydrocodone-acetaminophen, and Other   Social History   Socioeconomic History  . Marital status: Single    Spouse name: Not on file  . Number of children: Not on file  . Years of education: Not on file  . Highest education level: Not on file  Occupational History  . Not on file  Tobacco Use  . Smoking status: Never Smoker  . Smokeless tobacco: Never Used  Substance and Sexual Activity  . Alcohol use: No  . Drug use: No  . Sexual activity: Not on file  Other Topics Concern  . Not on file  Social History Narrative  . Not on file   Social Determinants of Health   Financial Resource Strain: Not on file  Food Insecurity: Not on file  Transportation Needs: Not on file  Physical Activity: Not on file  Stress: Not on file  Social Connections: Not on file     Family History: The patient's family history includes Diabetes in her brother and maternal aunt; Berenice Primas' disease in her sister; Hypothyroidism in her mother.  ROS:   Please see the history of present illness.    ROS  All other systems reviewed and negative.   EKGs/Labs/Other Studies Reviewed:    The following studies were reviewed today: none  EKG:  EKG is not ordered today.   Recent Labs: 08/01/2020: NT-Pro BNP 131 08/22/2020: ALT 12; Hemoglobin 14.0; Platelets 246.0 09/26/2020: BUN 15; Creatinine, Ser 0.91; Potassium 4.6; Sodium 140 11/15/2020: TSH 0.04   Recent Lipid Panel    Component Value Date/Time   CHOL 142 05/30/2019 1852   TRIG 42 05/30/2019 1852   HDL 55 05/30/2019 1852   CHOLHDL 2.6 05/30/2019 1852   VLDL 8 05/30/2019 1852   LDLCALC 79 05/30/2019 1852    Physical Exam:    VS:  BP 120/74   Pulse 82   Ht 5\' 2"  (1.575 m)   Wt 196 lb 12.8 oz (89.3 kg)   SpO2 97%   BMI 36.00 kg/m     Wt Readings from Last 3 Encounters:  01/31/21 196 lb 12.8 oz (89.3 kg)  01/07/21 196 lb 9.6 oz (89.2 kg)  11/19/20 196 lb (88.9 kg)    GEN: Well nourished, well developed in no acute  distress HEENT: Normal NECK: No JVD; No carotid bruits LYMPHATICS: No lymphadenopathy CARDIAC:RRR, no murmurs, rubs, gallops RESPIRATORY:  Clear to auscultation without rales, wheezing or rhonchi  ABDOMEN: Soft, non-tender, non-distended MUSCULOSKELETAL:  No edema; No deformity  SKIN: Warm and dry NEUROLOGIC:  Alert and oriented x 3 PSYCHIATRIC:  Normal affect    ASSESSMENT:    1. Chronic diastolic congestive heart failure ([place])   2.  PAF (paroxysmal atrial fibrillation) ([place])   3. Essential hypertension    PLAN:    In order of problems listed above:  Chronic diastolic CHF - cath 10/28/9483 with normal coronaries however reduced LV function on echocardiogram.   -2D echo done 08/2020 showed normal LVF with EF 55% with mild LVE -she appear euvolemic on exam today -her weight is stable -she has chronic DOE and LE edema which she thinks is stable and unchanged from when I saw her last -continue medical management of her HF with  Lasix 20mg  daily which was refilled for 1 year today -check BMET today  PAF -occurred in setting of Grave's dz but CHADS2VASC score is 4 -she remains in NSR and has not had any recurrent palpitations -she is no longer on DOAC as her afib occurred in setting of hyperthyroidism -she has a [employer] that she uses and has not had any PAF -she brought in 1 reading on her [employer] that showed NSR with wandering baseline artifact -she stopped the BB on her own because it was causing her to have bradycardia  HTN -her BP is adequately controlled on exam today -continue prescription drug treatment with Lotrel 5-20mg  daily -I will refill this for 1 year -check BMET -she stopped the Lopressor due to bradycardia  Mildly dilated ascending aorta -noted to be 4.2cm on echo 08/2020 -Chest CTA reviewed and showed 4cm dilatation -repeat 2D echo in 1 year to follow  Preoperative cardiac clearance -she had normal coronary arteries in 05/2019 -she has no  anginal symptoms -She has a moderate risk of perioperative major cardiac event at 6.6% mainly due to her hx of CHF but her LVF is normal has chronic diastolic CHF and normal coronary arteries on cath in 2020.   -need to avoid volume overload in the periop period  Followup with me in 1 year   Medication Adjustments/Labs and Tests Ordered: Current medicines are reviewed at length with the patient today.  Concerns regarding medicines are outlined above.  No orders of the defined types were placed in this encounter.  No orders of the defined types were placed in this encounter.   Signed, Fransico Him, MD  01/31/2021 1:13 PM    [place]

## 2021-01-31 NOTE — Addendum Note (Signed)
Addended by: Antonieta Iba on: 01/31/2021 01:27 PM   Modules accepted: Orders

## 2021-01-31 NOTE — Patient Instructions (Signed)
Medication Instructions:  Your physician recommends that you continue on your current medications as directed. Please refer to the Current Medication list given to you today.  *If you need a refill on your cardiac medications before your next appointment, please call your pharmacy*   Lab Work: TODAY: BMET If you have labs (blood work) drawn today and your tests are completely normal, you will receive your results only by: Marland Kitchen MyChart Message (if you have MyChart) OR . A paper copy in the mail If you have any lab test that is abnormal or we need to change your treatment, we will call you to review the results.   Testing/Procedures: Your physician has requested that you have an echocardiogram in one year. Echocardiography is a painless test that uses sound waves to create images of your heart. It provides your doctor with information about the size and shape of your heart and how well your heart's chambers and valves are working. This procedure takes approximately one hour. There are no restrictions for this procedure.  Follow-Up: At [facility], you and your health needs are our priority.  As part of our continuing mission to provide you with exceptional heart care, we have created designated Provider Care Teams.  These Care Teams include your primary Cardiologist (physician) and Advanced Practice Providers (APPs -  Physician Assistants and Nurse Practitioners) who all work together to provide you with the care you need, when you need it.  Your next appointment:   1 year(s)  The format for your next appointment:   In Person  Provider:   You may see Fransico Him, MD or one of the following Advanced Practice Providers on your designated Care Team:    Melina Copa, PA-C  Ermalinda Barrios, PA-C

## 2021-01-31 NOTE — Addendum Note (Signed)
Addended by: Antonieta Iba on: 01/31/2021 01:25 PM   Modules accepted: Orders

## 2021-02-01 LAB — BASIC METABOLIC PANEL
BUN/Creatinine Ratio: 15 (ref 12–28)
BUN: 11 mg/dL (ref 8–27)
CO2: 23 mmol/L (ref 20–29)
Calcium: 9.5 mg/dL (ref 8.7–10.3)
Chloride: 103 mmol/L (ref 96–106)
Creatinine, Ser: 0.74 mg/dL (ref 0.57–1.00)
Glucose: 92 mg/dL (ref 65–99)
Potassium: 4.5 mmol/L (ref 3.5–5.2)
Sodium: 142 mmol/L (ref 134–144)
eGFR: 89 mL/min/{1.73_m2} (ref >59)

## 2021-02-10 ENCOUNTER — Other Ambulatory Visit: Payer: Self-pay | Admitting: Endocrinology

## 2021-02-14 ENCOUNTER — Other Ambulatory Visit (INDEPENDENT_AMBULATORY_CARE_PROVIDER_SITE_OTHER): Payer: Medicare Other

## 2021-02-14 DIAGNOSIS — E0590 Thyrotoxicosis, unspecified without thyrotoxic crisis or storm: Secondary | ICD-10-CM | POA: Diagnosis not present

## 2021-02-14 LAB — T4, FREE: Free T4: 1.24 ng/dL (ref 0.60–1.60)

## 2021-02-14 LAB — TSH: TSH: <0.01 u[IU]/mL — ABNORMAL LOW (ref 0.35–4.50)

## 2021-02-14 LAB — T3, FREE: T3, Free: 4.4 pg/mL — ABNORMAL HIGH (ref 2.3–4.2)

## 2021-02-18 ENCOUNTER — Encounter (HOSPITAL_COMMUNITY): Payer: Self-pay

## 2021-02-18 ENCOUNTER — Ambulatory Visit (INDEPENDENT_AMBULATORY_CARE_PROVIDER_SITE_OTHER): Payer: Medicare Other | Admitting: Endocrinology

## 2021-02-18 ENCOUNTER — Encounter: Payer: Self-pay | Admitting: Endocrinology

## 2021-02-18 ENCOUNTER — Other Ambulatory Visit: Payer: Self-pay

## 2021-02-18 VITALS — BP 120/78 | HR 78 | Ht 62.0 in | Wt 197.4 lb

## 2021-02-18 DIAGNOSIS — E0590 Thyrotoxicosis, unspecified without thyrotoxic crisis or storm: Secondary | ICD-10-CM | POA: Diagnosis not present

## 2021-02-18 DIAGNOSIS — E0500 Thyrotoxicosis with diffuse goiter without thyrotoxic crisis or storm: Secondary | ICD-10-CM | POA: Diagnosis not present

## 2021-02-18 NOTE — Progress Notes (Signed)
Patient ID: Christina [name], female   DOB: 1950/10/06, 70 y.o.   MRN: [id number]                                                                                                               Reason for Appointment:  Hyperthyroidism, follow-up visit   Chief complaint: Follow-up of thyroid   History of Present Illness:   Prior history: For the last several years the patient has had symptoms of occasional palpitations, shortness of breath on exertion, shakiness and feeling somewhat warm.  However she did not have any recent worsening of the symptoms except on the morning of hospitalization She previously did not have any unusual weight loss, anxiety or fatigue She was also having some swelling of her legs prior to admission Since she was having difficulties with tachycardia and atrial fibrillation her thyroid level was checked in the hospital and indicated hyperthyroidism  RECENT HISTORY:  Baseline symptoms before treatment were occasional palpitations, shortness of breath on exertion, shakiness and feeling warm She has Graves' disease confirmed with a baseline thyrotropin receptor antibody level of 3.9  She has been treated with methimazole since 05/2019  Her dose has fluctuated between 5 and 10 mg daily  She appears to be needing frequent adjustments of her doses In 1/22 she was complaining of feeling more fatigued and her free T4 was 0.59 This was with taking 7.5 mg methimazole daily   She is now taking 5 mg daily and the dose was increased in 2/22 when free T3 was high Recently she is not complaining of any palpitations, heat intolerance, shakiness No unusual fatigue  She has taken her methimazole regularly Weight is about the same  Thyroid levels now show a high free T3 and relatively higher free T4 with undetectable TSH  Last thyrotropin receptor antibody in 1/22 was higher at 11.2 compared to 9.5   Wt Readings from Last 3 Encounters:  02/18/21 197 lb 6.4 oz (89.5 kg)   01/31/21 196 lb 12.8 oz (89.3 kg)  01/07/21 196 lb 9.6 oz (89.2 kg)   Thyroid function tests as follows:     Lab Results  Component Value Date   FREET4 1.24 02/14/2021   FREET4 1.03 11/15/2020   FREET4 0.59 (L) 09/26/2020   T3FREE 4.4 (H) 02/14/2021   T3FREE 3.9 01/07/2021   T3FREE 4.4 (H) 11/14/2020   TSH <0.01 (L) 02/14/2021   TSH 0.04 (L) 11/15/2020   TSH 0.22 (L) 09/26/2020    Lab Results  Component Value Date   THYROTRECAB 11.20 (H) 09/26/2020   THYROTRECAB 9.46 (H) 07/15/2020   THYROTRECAB 2.28 (H) 04/15/2020   OPHTHALMOPATHY:  Since about 11/20 she was having more swelling of her eyes, some double vision, and a pressure sensation Subsequently in December she had more fuzzy vision, feeling of fullness in her eyes and continued double vision She went to the urgent care center and was given a Medrol Dosepak  With the recurrence of the symptoms in 1/21 she was again started on tapering dose of  prednisone Has been treated by oculoplastic ophthalmologist and she had been on Medrol high-dose weekly injections started in late February with only mild and short lasting relief  She had a complete the course of Tepezza  infusions  This improved her vision and has reduced the swelling and fullness in her eyes She thinks her eyes are doing quite well but she is concerned about the eyelids drooping She uses refresh drops for dryness  CT scan of orbits has not shown any proptosis and only thickening of the eye muscles which has improved    Allergies as of 02/18/2021      Reactions   Hydrochlorothiazide Other (See Comments), Hypertension   Wildly fluctuates B/P low-to-high, then high-to-low, in a matter of minutes   Hydrocodone-acetaminophen Nausea And Vomiting   Other Nausea And Vomiting, Other (See Comments)   Stronger pain meds (post-op) caused dehydration and constipation      Medication List       Accurate as of Feb 18, 2021  3:10 PM. If you have any questions, ask  your nurse or doctor.        STOP taking these medications   amoxicillin 500 MG capsule Commonly known as: AMOXIL Stopped by: Elayne Snare, MD     TAKE these medications   acetaminophen 650 MG CR tablet Commonly known as: TYLENOL Take 650 mg by mouth daily.   adapalene 0.1 % gel Commonly known as: DIFFERIN Apply topically at bedtime.   amLODipine-benazepril 5-20 MG capsule Commonly known as: LOTREL Take 1/2 tablet (2.5-10mg  total) by mouth once daily. What changed:   how much to take  how to take this  when to take this   Apple Cider Vinegar 600 MG Caps Take 600 mg by mouth daily.   aspirin EC 325 MG tablet Take 325 mg by mouth daily.   celecoxib 200 MG capsule Commonly known as: CELEBREX Take 200 mg by mouth daily.   Dapsone 5 % topical gel Apply 1 application topically See admin instructions. Apply to the face daily as directed   furosemide 20 MG tablet Commonly known as: LASIX Take 1 tablet (20 mg total) by mouth daily.   Linseed Oil Oil Take 1 capsule by mouth daily.   loratadine 10 MG tablet Commonly known as: CLARITIN Take 10 mg by mouth daily.   methimazole 5 MG tablet Commonly known as: TAPAZOLE Take 2 tablets by mouth once daily   OCUVITE LUTEIN 25 PO Take 25 mg by mouth daily.   Resveratrol 50 MG Caps Take 75 mg by mouth daily.           Past Medical History:  Diagnosis Date  . Acquired dilation of ascending aorta and aortic root (HCC)    4cm upper normal on Chest CT 09/2020  . 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 Asthma    hx of - PT STATES MILD - NO LONGER REQUIRES INHALERS  . CHF (congestive heart failure) ([place])   . Colitis    mild   . GERD (gastroesophageal reflux disease)   . H/O hiatal hernia   . Hypertension    hx of 10 years ago ; PT'S BLOOD PRESSURE RECENTLY ELEVATED WHILE IN HOSP FOR SURG- NOT ON ANY B/P MEDS  . Hyperthyroidism   . PAF (paroxysmal  atrial fibrillation) ([place])     Past Surgical History:  Procedure Laterality Date  . KNEE CLOSED REDUCTION Left 06/05/2014  Procedure: CLOSED MANIPULATION KNEE;  Surgeon: Mauri Pole, MD;  Location: [facility];  Service: Orthopedics;  Laterality: Left;  . RIGHT/LEFT HEART CATH AND CORONARY ANGIOGRAPHY N/A 05/31/2019   Procedure: RIGHT/LEFT HEART CATH AND CORONARY ANGIOGRAPHY;  Surgeon: Martinique, Peter M, MD;  Location: [facility];  Service: Cardiovascular;  Laterality: N/A;  . TOTAL KNEE ARTHROPLASTY Left 05/07/2014   Procedure: LEFT TOTAL KNEE ARTHROPLASTY;  Surgeon: Mauri Pole, MD;  Location: [facility];  Service: Orthopedics;  Laterality: Left;  . TOTAL KNEE ARTHROPLASTY Right 06/05/2014   Procedure: RIGHT TOTAL KNEE ARTHROPLASTY;  Surgeon: Mauri Pole, MD;  Location: [facility];  Service: Orthopedics;  Laterality: Right;  . WISDOM TEETH EXTRACTIONS      Family History  Problem Relation Age of Onset  . Hypothyroidism Mother   . Graves' disease Sister   . Diabetes Brother   . Diabetes Maternal Aunt     Social History:  reports that she has never smoked. She has never used smokeless tobacco. She reports that she does not drink alcohol and does not use drugs.  Allergies:  Allergies  Allergen Reactions  . Hydrochlorothiazide Other (See Comments) and Hypertension    Wildly fluctuates B/P low-to-high, then high-to-low, in a matter of minutes  . Hydrocodone-Acetaminophen Nausea And Vomiting  . Other Nausea And Vomiting and Other (See Comments)    Stronger pain meds (post-op) caused dehydration and constipation     Review of Systems  Essential hypertension: Her PCP has had her on Lotrel   BP Readings from Last 3 Encounters:  02/18/21 120/78  01/31/21 120/74  01/07/21 128/82       Examination:   BP 120/78 (BP Location: Left Arm, Patient Position: Sitting, Cuff Size: Normal)   Pulse 78   Ht 5\' 2"  (1.575 m)   Wt 197 lb 6.4 oz (89.5 kg)   SpO2 97%   BMI 36.10 kg/m    Minimal left retraction on the right side Mild swelling of the upper eyelids especially left   Assessment/Plan:   Hyperthyroidism, from Graves' disease with baseline thyrotropin receptor antibody of 3.9  She has been on methimazole since early September 2020   She has had frequent adjustments of her methimazole dose although usually between 5 to 10 mg a day only Last thyrotropin receptor antibody was still high Has not had associated goiter  Subjectively doing well and now taking 5 mg methimazole  She is again getting slightly hyperthyroid even though her medication had to be reduced earlier this year Currently not having any hyperthyroid symptoms and exam is unremarkable  Likely needs long-term methimazole treatment and may be a candidate for thyroidectomy but she wants to defer this  She will increase her methimazole to 7.5 mg  Follow-up in 6 weeks  Ophthalmopathy: She will discuss her symptoms of drooping of the eyelids with her ophthalmologist  Elayne Snare 02/18/2021, 3:10 PM    Note: This office note was prepared with Dragon voice recognition system technology. Any transcriptional errors that result from this process are unintentional.

## 2021-02-18 NOTE — Patient Instructions (Addendum)
DUE TO COVID-19 ONLY ONE VISITOR IS ALLOWED TO COME WITH YOU AND STAY IN THE WAITING ROOM ONLY DURING PRE OP AND PROCEDURE DAY OF SURGERY.   TWO VISITOR  MAY VISIT WITH YOU AFTER SURGERY IN YOUR PRIVATE ROOM DURING VISITING HOURS ONLY!  YOU NEED TO HAVE A COVID 19 TEST ON_6-14-22______ @__1 :00_____, THIS TEST MUST BE DONE BEFORE SURGERY,    COVID TESTING SITE [place]  [id number],   IT IS ON THE RIGHT GOING OUT [address] APPROXIMATELY  2 MINUTES PAST [employer] ON THE RIGHT. ONCE YOUR COVID TEST IS COMPLETED,  PLEASE BEGIN THE QUARANTINE INSTRUCTIONS AS OUTLINED IN YOUR HANDOUT.                Christina [name]  02/18/2021   Your procedure is scheduled on: 03-07-21   Report to [facility] Main  Entrance   Report to admitting at    1000 AM     Call this number if you have problems the morning of surgery [phone]    Remember: Do not eat food  :After Midnight. You may have clear liquids until         0900 am then nothing by mouth    CLEAR LIQUID DIET   Foods Allowed                                                                              Foods Excluded   black Coffee and tea, regular and decaf                             liquids that you cannot  Plain Jell-O any favor except red or purple                                           see through such as: Fruit ices (not with fruit pulp)                                                      milk, soups, orange juice  Iced Popsicles                                                     All solid food Carbonated beverages, regular and diet                                    Cranberry, grape and apple juices Sports drinks like Gatorade Lightly seasoned clear broth or consume(fat free) Sugar, honey syrup   _____________________________________________________________________      BRUSH YOUR TEETH MORNING OF SURGERY AND RINSE YOUR MOUTH OUT, NO CHEWING GUM CANDY  OR MINTS.     Take  these medicines the morning of surgery with A SIP OF WATER: methimazole                                 You may not have any metal on your body including hair pins and              piercings  Do not wear jewelry, make-up, lotions, powders or perfumes, deodorant             Do not wear nail polish on your fingernails.  Do not shave  48 hours prior to surgery.     Do not bring valuables to the hospital. [place].  Contacts, dentures or bridgework may not be worn into surgery.      Patients discharged the day of surgery will not be allowed to drive home. IF YOU ARE HAVING SURGERY AND GOING HOME THE SAME DAY, YOU MUST HAVE AN ADULT TO DRIVE YOU HOME AND BE WITH YOU FOR 24 HOURS. YOU MAY GO HOME BY TAXI OR UBER OR ORTHERWISE, BUT AN ADULT MUST ACCOMPANY YOU HOME AND STAY WITH YOU FOR 24 HOURS.  Name and phone number of your driver:  Special Instructions: N/A              Please read over the following fact sheets you were given: _____________________________________________________________________             [facility] - Preparing for Surgery Before surgery, you can play an important role.  Because skin is not sterile, your skin needs to be as free of germs as possible.  You can reduce the number of germs on your skin by washing with CHG (chlorahexidine gluconate) soap before surgery.  CHG is an antiseptic cleaner which kills germs and bonds with the skin to continue killing germs even after washing. Please DO NOT use if you have an allergy to CHG or antibacterial soaps.  If your skin becomes reddened/irritated stop using the CHG and inform your nurse when you arrive at Short Stay. Do not shave (including legs and underarms) for at least 48 hours prior to the first CHG shower.  You may shave your face/neck. Please follow these instructions carefully:  1.  Shower with CHG Soap the night before surgery and the  morning of Surgery.  2.  If  you choose to wash your hair, wash your hair first as usual with your  normal  shampoo.  3.  After you shampoo, rinse your hair and body thoroughly to remove the  shampoo.                           4.  Use CHG as you would any other liquid soap.  You can apply chg directly  to the skin and wash                       Gently with a scrungie or clean washcloth.  5.  Apply the CHG Soap to your body ONLY FROM THE NECK DOWN.   Do not use on face/ open                           Wound  or open sores. Avoid contact with eyes, ears mouth and genitals (private parts).                       Wash face,  Genitals (private parts) with your normal soap.             6.  Wash thoroughly, paying special attention to the area where your surgery  will be performed.  7.  Thoroughly rinse your body with warm water from the neck down.  8.  DO NOT shower/wash with your normal soap after using and rinsing off  the CHG Soap.                9.  Pat yourself dry with a clean towel.            10.  Wear clean pajamas.            11.  Place clean sheets on your bed the night of your first shower and do not  sleep with pets. Day of Surgery : Do not apply any lotions/deodorants the morning of surgery.  Please wear clean clothes to the hospital/surgery center.  FAILURE TO FOLLOW THESE INSTRUCTIONS MAY RESULT IN THE CANCELLATION OF YOUR SURGERY PATIENT SIGNATURE_________________________________  NURSE SIGNATURE__________________________________  ________________________________________________________________________   Adam Phenix  An incentive spirometer is a tool that can help keep your lungs clear and active. This tool measures how well you are filling your lungs with each breath. Taking long deep breaths may help reverse or decrease the chance of developing breathing (pulmonary) problems (especially infection) following:  A long period of time when you are unable to move or be active. BEFORE THE PROCEDURE    If the spirometer includes an indicator to show your best effort, your nurse or respiratory therapist will set it to a desired goal.  If possible, sit up straight or lean slightly forward. Try not to slouch.  Hold the incentive spirometer in an upright position. INSTRUCTIONS FOR USE  1. Sit on the edge of your bed if possible, or sit up as far as you can in bed or on a chair. 2. Hold the incentive spirometer in an upright position. 3. Breathe out normally. 4. Place the mouthpiece in your mouth and seal your lips tightly around it. 5. Breathe in slowly and as deeply as possible, raising the piston or the ball toward the top of the column. 6. Hold your breath for 3-5 seconds or for as long as possible. Allow the piston or ball to fall to the bottom of the column. 7. Remove the mouthpiece from your mouth and breathe out normally. 8. Rest for a few seconds and repeat Steps 1 through 7 at least 10 times every 1-2 hours when you are awake. Take your time and take a few normal breaths between deep breaths. 9. The spirometer may include an indicator to show your best effort. Use the indicator as a goal to work toward during each repetition. 10. After each set of 10 deep breaths, practice coughing to be sure your lungs are clear. If you have an incision (the cut made at the time of surgery), support your incision when coughing by placing a pillow or rolled up towels firmly against it. Once you are able to get out of bed, walk around indoors and cough well. You may stop using the incentive spirometer when instructed by your caregiver.  RISKS AND COMPLICATIONS  Take your time so you do not get dizzy  or light-headed.  If you are in pain, you may need to take or ask for pain medication before doing incentive spirometry. It is harder to take a deep breath if you are having pain. AFTER USE  Rest and breathe slowly and easily.  It can be helpful to keep track of a log of your progress. Your caregiver  can provide you with a simple table to help with this. If you are using the spirometer at home, follow these instructions: [place] IF:   You are having difficultly using the spirometer.  You have trouble using the spirometer as often as instructed.  Your pain medication is not giving enough relief while using the spirometer.  You develop fever of 100.5 F (38.1 C) or higher. SEEK IMMEDIATE MEDICAL CARE IF:   You cough up bloody sputum that had not been present before.  You develop fever of 102 F (38.9 C) or greater.  You develop worsening pain at or near the incision site. MAKE SURE YOU:   Understand these instructions.  Will watch your condition.  Will get help right away if you are not doing well or get worse. Document Released: 01/18/2007 Document Revised: 11/30/2011 Document Reviewed: 03/21/2007 [facility] Patient Information 2014 [place], [place].   ________________________________________________________________________

## 2021-02-18 NOTE — Progress Notes (Addendum)
PCP - Dr. Bertram Millard , MD [facility] [place] Cardiologist - Clearance Fransico Him, MD  01-31-21  epic Dr Dwyane Zamyia -endocrinologist PPM/ICD -  Device Orders -  Rep Notified -   Chest x-ray - CTA chest 10-08-20 EKG - 08-01-20 Stress Test -  ECHO - 08-2020 Cardiac Cath -   Sleep Study -  CPAP -   Fasting Blood Sugar -  Checks Blood Sugar _____ times a day  Blood Thinner Instructions: Aspirin Instructions:325 mg holding 2-5 days prior to surgery  ERAS Protcol - PRE-SURGERY Ensure or G2-   COVID TEST- 03-05-21  Activity---Able to walk around home and do some yard work without SOB   Anesthesia review: CHF,PAF,HTN, Graves disease Thyroid eye disease, mod. Surg. Risk   Patient denies shortness of breath, fever, cough and chest pain at PAT appointment   All instructions explained to the patient, with a verbal understanding of the material. Patient agrees to go over the instructions while at home for a better understanding. Patient also instructed to self quarantine after being tested for COVID-19. The opportunity to ask questions was provided.

## 2021-02-19 MED ORDER — AMLODIPINE BESY-BENAZEPRIL HCL 2.5-10 MG PO CAPS: 1.0000 | ORAL_CAPSULE | Freq: Every day | ORAL | 3 refills | Status: DC

## 2021-02-20 ENCOUNTER — Other Ambulatory Visit (HOSPITAL_COMMUNITY): Payer: Self-pay

## 2021-02-24 ENCOUNTER — Other Ambulatory Visit: Payer: Self-pay

## 2021-02-24 ENCOUNTER — Encounter (HOSPITAL_COMMUNITY)
Admission: RE | Admit: 2021-02-24 | Discharge: 2021-02-24 | Disposition: A | Discharge status: Home / Self Care | Payer: Medicare Other | Source: Ambulatory Visit | Attending: Urology | Admitting: Urology

## 2021-02-24 ENCOUNTER — Encounter (HOSPITAL_COMMUNITY): Payer: Self-pay

## 2021-02-24 DIAGNOSIS — Z79899 Other long term (current) drug therapy: Secondary | ICD-10-CM | POA: Insufficient documentation

## 2021-02-24 DIAGNOSIS — Z7982 Long term (current) use of aspirin: Secondary | ICD-10-CM | POA: Diagnosis not present

## 2021-02-24 DIAGNOSIS — I509 Heart failure, unspecified: Secondary | ICD-10-CM | POA: Insufficient documentation

## 2021-02-24 DIAGNOSIS — N2889 Other specified disorders of kidney and ureter: Secondary | ICD-10-CM | POA: Diagnosis not present

## 2021-02-24 DIAGNOSIS — Z01812 Encounter for preprocedural laboratory examination: Secondary | ICD-10-CM | POA: Diagnosis not present

## 2021-02-24 DIAGNOSIS — I110 Hypertensive heart disease with heart failure: Secondary | ICD-10-CM | POA: Diagnosis not present

## 2021-02-24 DIAGNOSIS — I480 Paroxysmal atrial fibrillation: Secondary | ICD-10-CM | POA: Insufficient documentation

## 2021-02-24 HISTORY — DX: Heart failure, unspecified: I50.9

## 2021-02-24 HISTORY — DX: Malignant (primary) neoplasm, unspecified: C80.1

## 2021-02-24 LAB — PROTIME-INR
INR: 0.9 (ref 0.8–1.2)
Prothrombin Time: 12.5 seconds (ref 11.4–15.2)

## 2021-02-24 LAB — CBC
HCT: 41.5 % (ref 36.0–46.0)
Hemoglobin: 13.7 g/dL (ref 12.0–15.0)
MCH: 30.4 pg (ref 26.0–34.0)
MCHC: 33 g/dL (ref 30.0–36.0)
MCV: 92 fL (ref 80.0–100.0)
Platelets: 262 10*3/uL (ref 150–400)
RBC: 4.51 MIL/uL (ref 3.87–5.11)
RDW: 13.5 % (ref 11.5–15.5)
WBC: 5.3 10*3/uL (ref 4.0–10.5)
nRBC: 0 % (ref 0.0–0.2)

## 2021-02-24 LAB — TYPE AND SCREEN
ABO/RH(D): B POS
Antibody Screen: NEGATIVE

## 2021-02-24 LAB — BASIC METABOLIC PANEL
Anion gap: 9 (ref 5–15)
BUN: 18 mg/dL (ref 8–23)
CO2: 22 mmol/L (ref 22–32)
Calcium: 9.5 mg/dL (ref 8.9–10.3)
Chloride: 108 mmol/L (ref 98–111)
Creatinine, Ser: 0.87 mg/dL (ref 0.44–1.00)
GFR, Estimated: >60 mL/min (ref >60)
Glucose, Bld: 101 mg/dL — ABNORMAL HIGH (ref 70–99)
Potassium: 4.2 mmol/L (ref 3.5–5.1)
Sodium: 139 mmol/L (ref 135–145)

## 2021-02-25 DIAGNOSIS — D4101 Neoplasm of uncertain behavior of right kidney: Secondary | ICD-10-CM | POA: Diagnosis not present

## 2021-02-25 NOTE — Anesthesia Preprocedure Evaluation (Addendum)
Anesthesia Evaluation  Patient identified by MRN, date of birth, ID band Patient awake    Reviewed: Allergy & Precautions, NPO status , Patient's Chart, lab work & pertinent test results  Airway Mallampati: II  TM Distance: >3 FB Neck ROM: Full    Dental no notable dental hx. (+) Caps, Dental Advisory Given, Teeth Intact   Pulmonary asthma ,    Pulmonary exam normal breath sounds clear to auscultation       Cardiovascular hypertension, Pt. on medications +CHF  Normal cardiovascular exam+ dysrhythmias Atrial Fibrillation  Rhythm:Regular Rate:Normal  Echo 08/23/2020 1. Left ventricular ejection fraction, by estimation, is 55%. The left  ventricle has normal function. The left ventricle has no regional wall  motion abnormalities. The left ventricular internal cavity size was mildly  dilated. Left ventricular diastolic  parameters were normal.  2. Right ventricular systolic function is normal. The right ventricular  size is normal.  3. The mitral valve is normal in structure. Trivial mitral valve  regurgitation. No evidence of mitral stenosis.  4. The aortic valve is tricuspid. Aortic valve regurgitation is mild.  Mild aortic valve sclerosis is present, with no evidence of aortic valve  stenosis.  5. Aortic dilatation noted. There is mild dilatation of the ascending  aorta, measuring 42 mm.  6. The inferior vena cava is normal in size with greater than 50%  respiratory variability, suggesting right atrial pressure of 3 mmHg. 1. Left ventricular ejection fraction, by estimation, is 55%. The left  ventricle has normal function. The left ventricle has no regional wall  motion abnormalities. The left ventricular internal cavity size was mildly  dilated. Left ventricular diastolic  parameters were normal.  2. Right ventricular systolic function is normal. The right ventricular  size is normal.  3. The mitral valve is normal  in structure. Trivial mitral valve  regurgitation. No evidence of mitral stenosis.  4. The aortic valve is tricuspid. Aortic valve regurgitation is mild.  Mild aortic valve sclerosis is present, with no evidence of aortic valve  stenosis.  5. Aortic dilatation noted. There is mild dilatation of the ascending  aorta, measuring 42 mm.  6. The inferior vena cava is normal in size with greater than 50%  respiratory variability, suggesting right atrial pressure of 3 mmHg.    Neuro/Psych  Neuromuscular disease negative neurological ROS     GI/Hepatic hiatal hernia, GERD  ,  Endo/Other  Hyperthyroidism   Renal/GU Renal disease     Musculoskeletal  (+) Arthritis ,   Abdominal (+) + obese,   Peds  Hematology  (+) anemia , Lab Results      Component                Value               Date                      WBC                      5.3                 02/24/2021                HGB                      13.7                02/24/2021  HCT                      41.5                02/24/2021                MCV                      92.0                02/24/2021                PLT                      262                 02/24/2021              Anesthesia Other Findings   Reproductive/Obstetrics                          Anesthesia Physical Anesthesia Plan  ASA: III  Anesthesia Plan: General   Post-op Pain Management:    Induction: Intravenous  PONV Risk Score and Plan: 4 or greater and Treatment may vary due to age or medical condition, Midazolam and Ondansetron  Airway Management Planned: Oral ETT  Additional Equipment: None  Intra-op Plan:   Post-operative Plan:   Informed Consent: I have reviewed the patients History and Physical, chart, labs and discussed the procedure including the risks, benefits and alternatives for the proposed anesthesia with the patient or authorized representative who has indicated his/her  understanding and acceptance.     Dental advisory given  Plan Discussed with: Anesthesiologist and CRNA  Anesthesia Plan Comments: (See PAT note 02/24/2021, Konrad Felix, PA-C  GA w lidocaine infusion )      Anesthesia Quick Evaluation

## 2021-02-25 NOTE — Progress Notes (Signed)
Anesthesia Chart Review   Case: [id number] Date/Time: 03/07/21 1145   Procedure: XI ROBOTIC ASSITED RADICAL NEPHRECTOMY (Right )   Anesthesia type: General   Pre-op diagnosis: RIGHT RENAL MASS   Location: [facility] ROOM 03 / [facility]   Surgeons: Ceasar Mons, MD      DISCUSSION:70 y.o. never smoker with h/o GERD, asthma, HTN, PAF, CHF, hyperthyroidism scheduled for above procedure 03/07/2021 with Dr. Harrell Gave Lovena Neighbours.   Pt last seen by cardiology 01/31/2021. Per OV notes,  "-she had normal coronary arteries in 05/2019 -she has no anginal symptoms -She has a moderate risk of perioperative major cardiac event at 6.6% mainly due to her hx of CHF but her LVF is normal has chronic diastolic CHF and normal coronary arteries on cath in 2020.   -need to avoid volume overload in the periop period"  Anticipate pt can proceed with planned procedure barring acute status change.   VS: BP (!) 143/78   Pulse 83   Temp 36.7 C (Oral)   Resp 18   SpO2 99%   PROVIDERS: Mateo Flow, MD is PCP   Fransico Him, MD is Cardiologist  LABS: Labs reviewed: Acceptable for surgery. (all labs ordered are listed, but only abnormal results are displayed)  Labs Reviewed  BASIC METABOLIC PANEL - Abnormal; Notable for the following components:      Result Value   Glucose, Bld 101 (*)    All other components within normal limits  CBC  PROTIME-INR  TYPE AND SCREEN     IMAGES:   EKG: 08/01/20 Rate 73 bpm  NSR  CV: Echo 08/23/2020 1. Left ventricular ejection fraction, by estimation, is 55%. The left  ventricle has normal function. The left ventricle has no regional wall  motion abnormalities. The left ventricular internal cavity size was mildly  dilated. Left ventricular diastolic  parameters were normal.  2. Right ventricular systolic function is normal. The right ventricular  size is normal.  3. The mitral valve is normal in structure. Trivial mitral valve  regurgitation. No evidence  of mitral stenosis.  4. The aortic valve is tricuspid. Aortic valve regurgitation is mild.  Mild aortic valve sclerosis is present, with no evidence of aortic valve  stenosis.  5. Aortic dilatation noted. There is mild dilatation of the ascending  aorta, measuring 42 mm.  6. The inferior vena cava is normal in size with greater than 50%  respiratory variability, suggesting right atrial pressure of 3 mmHg. 1. Left ventricular ejection fraction, by estimation, is 55%. The left  ventricle has normal function. The left ventricle has no regional wall  motion abnormalities. The left ventricular internal cavity size was mildly  dilated. Left ventricular diastolic  parameters were normal.  2. Right ventricular systolic function is normal. The right ventricular  size is normal.  3. The mitral valve is normal in structure. Trivial mitral valve  regurgitation. No evidence of mitral stenosis.  4. The aortic valve is tricuspid. Aortic valve regurgitation is mild.  Mild aortic valve sclerosis is present, with no evidence of aortic valve  stenosis.  5. Aortic dilatation noted. There is mild dilatation of the ascending  aorta, measuring 42 mm.  6. The inferior vena cava is normal in size with greater than 50%  respiratory variability, suggesting right atrial pressure of 3 mmHg.  Cardiac Cath 05/31/2019  There is mild left ventricular systolic dysfunction.  LV end diastolic pressure is normal.  The left ventricular ejection fraction is 50-55% by visual estimate.  1. Normal coronary anatomy 2. Low normal to mildly reduced LV function. Assessment limited by Afib. 3. Normal right heart pressures  4. LV filling pressure are upper normal 5. Cardiac index 2.37.  Plan: medical management. Would avoid using right radial access in the future.   Past Medical History:  Diagnosis Date  . Acquired dilation of ascending aorta and aortic root (HCC)    4cm upper normal on Chest CT 09/2020  .  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 Asthma    hx of - PT STATES MILD - NO LONGER REQUIRES INHALERS  . Cancer (HCC)    renal cell carcinoma  . CHF (congestive heart failure) ([place])   . Colitis    mild   . GERD (gastroesophageal reflux disease)   . H/O hiatal hernia   . Hypertension    hx of 10 years ago ; PT'S BLOOD PRESSURE RECENTLY ELEVATED WHILE IN HOSP FOR SURG- NOT ON ANY B/P MEDS  . Hyperthyroidism   . PAF (paroxysmal atrial fibrillation) ([place])     Past Surgical History:  Procedure Laterality Date  . KNEE CLOSED REDUCTION Left 06/05/2014   Procedure: CLOSED MANIPULATION KNEE;  Surgeon: Mauri Pole, MD;  Location: [facility];  Service: Orthopedics;  Laterality: Left;  . RIGHT/LEFT HEART CATH AND CORONARY ANGIOGRAPHY N/A 05/31/2019   Procedure: RIGHT/LEFT HEART CATH AND CORONARY ANGIOGRAPHY;  Surgeon: Martinique, Peter M, MD;  Location: [facility];  Service: Cardiovascular;  Laterality: N/A;  . TOTAL KNEE ARTHROPLASTY Left 05/07/2014   Procedure: LEFT TOTAL KNEE ARTHROPLASTY;  Surgeon: Mauri Pole, MD;  Location: [facility];  Service: Orthopedics;  Laterality: Left;  . TOTAL KNEE ARTHROPLASTY Right 06/05/2014   Procedure: RIGHT TOTAL KNEE ARTHROPLASTY;  Surgeon: Mauri Pole, MD;  Location: [facility];  Service: Orthopedics;  Laterality: Right;  . WISDOM TEETH EXTRACTIONS      MEDICATIONS: . acetaminophen (TYLENOL) 650 MG CR tablet  . adapalene (DIFFERIN) 0.1 % gel  . amlodipine-benazepril (LOTREL) 2.5-10 MG capsule  . Apple Cider Vinegar 600 MG CAPS  . aspirin EC 325 MG tablet  . carboxymethylcellulose (REFRESH PLUS) 0.5 % SOLN  . celecoxib (CELEBREX) 200 MG capsule  . Dapsone 5 % topical gel  . Flaxseed, Linseed, (FLAXSEED OIL) 1000 MG CAPS  . furosemide (LASIX) 20 MG tablet  . Lidocaine-Menthol (LIDOCAINE-MENTHOL ROLL-ON) 4-1 % LIQD  . loratadine (CLARITIN) 10 MG tablet  . Lutein-Zeaxanthin 25-5 MG CAPS   . methimazole (TAPAZOLE) 5 MG tablet   No current facility-administered medications for this encounter.    Konrad Felix, PA-C [facility] Pre-Surgical Testing [phone]

## 2021-03-04 ENCOUNTER — Other Ambulatory Visit (HOSPITAL_COMMUNITY)
Admission: RE | Admit: 2021-03-04 | Discharge: 2021-03-04 | Disposition: A | Discharge status: Home / Self Care | Payer: Medicare Other | Source: Ambulatory Visit | Attending: Urology | Admitting: Urology

## 2021-03-04 DIAGNOSIS — Z01812 Encounter for preprocedural laboratory examination: Secondary | ICD-10-CM | POA: Insufficient documentation

## 2021-03-04 DIAGNOSIS — Z20822 Contact with and (suspected) exposure to covid-19: Secondary | ICD-10-CM | POA: Insufficient documentation

## 2021-03-04 LAB — SARS CORONAVIRUS 2 (TAT 6-24 HRS): SARS Coronavirus 2: NEGATIVE

## 2021-03-07 ENCOUNTER — Inpatient Hospital Stay (HOSPITAL_COMMUNITY): Payer: Medicare Other | Admitting: Anesthesiology

## 2021-03-07 ENCOUNTER — Encounter (HOSPITAL_COMMUNITY): Payer: Self-pay | Admitting: Urology

## 2021-03-07 ENCOUNTER — Inpatient Hospital Stay (HOSPITAL_COMMUNITY)
Admission: RE | Admit: 2021-03-07 | Discharge: 2021-03-11 | DRG: 658 | Disposition: A | Discharge status: Home / Self Care | Payer: Medicare Other | Attending: Urology | Admitting: Urology

## 2021-03-07 ENCOUNTER — Other Ambulatory Visit (HOSPITAL_COMMUNITY): Payer: Self-pay

## 2021-03-07 ENCOUNTER — Other Ambulatory Visit: Payer: Self-pay

## 2021-03-07 ENCOUNTER — Encounter (HOSPITAL_COMMUNITY)
Admission: RE | Disposition: A | Discharge status: Home / Self Care | Payer: Self-pay | Source: Home / Self Care | Attending: Urology

## 2021-03-07 ENCOUNTER — Inpatient Hospital Stay (HOSPITAL_COMMUNITY): Payer: Medicare Other | Admitting: Physician Assistant

## 2021-03-07 DIAGNOSIS — Z96653 Presence of artificial knee joint, bilateral: Secondary | ICD-10-CM | POA: Diagnosis present

## 2021-03-07 DIAGNOSIS — I509 Heart failure, unspecified: Secondary | ICD-10-CM | POA: Diagnosis present

## 2021-03-07 DIAGNOSIS — I214 Non-ST elevation (NSTEMI) myocardial infarction: Secondary | ICD-10-CM | POA: Diagnosis not present

## 2021-03-07 DIAGNOSIS — J45909 Unspecified asthma, uncomplicated: Secondary | ICD-10-CM | POA: Diagnosis not present

## 2021-03-07 DIAGNOSIS — N2889 Other specified disorders of kidney and ureter: Secondary | ICD-10-CM | POA: Diagnosis not present

## 2021-03-07 DIAGNOSIS — Z20822 Contact with and (suspected) exposure to covid-19: Secondary | ICD-10-CM | POA: Diagnosis not present

## 2021-03-07 DIAGNOSIS — Z79899 Other long term (current) drug therapy: Secondary | ICD-10-CM

## 2021-03-07 DIAGNOSIS — E0590 Thyrotoxicosis, unspecified without thyrotoxic crisis or storm: Secondary | ICD-10-CM | POA: Diagnosis present

## 2021-03-07 DIAGNOSIS — K219 Gastro-esophageal reflux disease without esophagitis: Secondary | ICD-10-CM | POA: Diagnosis not present

## 2021-03-07 DIAGNOSIS — C641 Malignant neoplasm of right kidney, except renal pelvis: Principal | ICD-10-CM | POA: Diagnosis present

## 2021-03-07 DIAGNOSIS — J302 Other seasonal allergic rhinitis: Secondary | ICD-10-CM | POA: Diagnosis not present

## 2021-03-07 DIAGNOSIS — I110 Hypertensive heart disease with heart failure: Secondary | ICD-10-CM | POA: Diagnosis present

## 2021-03-07 DIAGNOSIS — I480 Paroxysmal atrial fibrillation: Secondary | ICD-10-CM | POA: Diagnosis not present

## 2021-03-07 HISTORY — PX: ROBOTIC ASSITED PARTIAL NEPHRECTOMY: SHX6087

## 2021-03-07 LAB — HEMOGLOBIN AND HEMATOCRIT, BLOOD
HCT: 40.8 % (ref 36.0–46.0)
Hemoglobin: 13.1 g/dL (ref 12.0–15.0)

## 2021-03-07 SURGERY — NEPHRECTOMY, PARTIAL, ROBOT-ASSISTED
Anesthesia: General | Laterality: Right

## 2021-03-07 MED ORDER — SODIUM CHLORIDE (PF) 0.9 % IJ SOLN
INTRAMUSCULAR | Status: AC
  Filled 2021-03-07: qty 10

## 2021-03-07 MED ORDER — HYDROMORPHONE HCL 1 MG/ML IJ SOLN
0.5000 mg | INTRAMUSCULAR | Status: DC | PRN
  Administered 2021-03-07 – 2021-03-09 (×8): 1 mg via INTRAVENOUS
  Filled 2021-03-07 (×8): qty 1

## 2021-03-07 MED ORDER — AMISULPRIDE (ANTIEMETIC) 5 MG/2ML IV SOLN: 10.0000 mg | Freq: Once | INTRAVENOUS | Status: DC | PRN

## 2021-03-07 MED ORDER — ROCURONIUM BROMIDE 10 MG/ML (PF) SYRINGE
PREFILLED_SYRINGE | INTRAVENOUS | Status: DC | PRN
  Administered 2021-03-07: 60 mg via INTRAVENOUS
  Administered 2021-03-07 (×2): 20 mg via INTRAVENOUS

## 2021-03-07 MED ORDER — DOCUSATE SODIUM 100 MG PO CAPS: 100.0000 mg | ORAL_CAPSULE | Freq: Two times a day (BID) | ORAL | Status: DC

## 2021-03-07 MED ORDER — LACTATED RINGERS IR SOLN
Status: DC | PRN
  Administered 2021-03-07: 1000 mL

## 2021-03-07 MED ORDER — DIPHENHYDRAMINE HCL 50 MG/ML IJ SOLN: 12.5000 mg | Freq: Four times a day (QID) | INTRAMUSCULAR | Status: DC | PRN

## 2021-03-07 MED ORDER — PHENYLEPHRINE 40 MCG/ML (10ML) SYRINGE FOR IV PUSH (FOR BLOOD PRESSURE SUPPORT)
PREFILLED_SYRINGE | INTRAVENOUS | Status: DC | PRN
  Administered 2021-03-07 (×2): 80 ug via INTRAVENOUS

## 2021-03-07 MED ORDER — MEPERIDINE HCL 50 MG/ML IJ SOLN
INTRAMUSCULAR | Status: AC
  Filled 2021-03-07: qty 1

## 2021-03-07 MED ORDER — SENNOSIDES-DOCUSATE SODIUM 8.6-50 MG PO TABS
2.0000 | ORAL_TABLET | Freq: Every day | ORAL | Status: DC
  Administered 2021-03-07 – 2021-03-10 (×4): 2 via ORAL
  Filled 2021-03-07 (×4): qty 2

## 2021-03-07 MED ORDER — ACETAMINOPHEN 10 MG/ML IV SOLN
INTRAVENOUS | Status: AC
  Filled 2021-03-07: qty 100

## 2021-03-07 MED ORDER — HYDROMORPHONE HCL 1 MG/ML IJ SOLN
INTRAMUSCULAR | Status: AC
  Administered 2021-03-07: 0.5 mg via INTRAVENOUS
  Filled 2021-03-07: qty 1

## 2021-03-07 MED ORDER — DEXAMETHASONE SODIUM PHOSPHATE 10 MG/ML IJ SOLN
INTRAMUSCULAR | Status: DC | PRN
  Administered 2021-03-07: 10 mg via INTRAVENOUS

## 2021-03-07 MED ORDER — DEXAMETHASONE SODIUM PHOSPHATE 10 MG/ML IJ SOLN
INTRAMUSCULAR | Status: AC
  Filled 2021-03-07: qty 1

## 2021-03-07 MED ORDER — LIDOCAINE 2% (20 MG/ML) 5 ML SYRINGE
INTRAMUSCULAR | Status: DC | PRN
  Administered 2021-03-07: 100 mg via INTRAVENOUS

## 2021-03-07 MED ORDER — ROCURONIUM BROMIDE 10 MG/ML (PF) SYRINGE
PREFILLED_SYRINGE | INTRAVENOUS | Status: AC
  Filled 2021-03-07: qty 10

## 2021-03-07 MED ORDER — ACETAMINOPHEN 10 MG/ML IV SOLN
1000.0000 mg | Freq: Four times a day (QID) | INTRAVENOUS | Status: AC
  Administered 2021-03-07 – 2021-03-08 (×4): 1000 mg via INTRAVENOUS
  Filled 2021-03-07 (×4): qty 100

## 2021-03-07 MED ORDER — TRAMADOL HCL 50 MG PO TABS: 50.0000 mg | ORAL_TABLET | Freq: Four times a day (QID) | ORAL | 0 refills | Status: DC | PRN

## 2021-03-07 MED ORDER — SODIUM CHLORIDE (PF) 0.9 % IJ SOLN
INTRAMUSCULAR | Status: DC | PRN
  Administered 2021-03-07: 20 mL

## 2021-03-07 MED ORDER — MIDAZOLAM HCL 2 MG/2ML IJ SOLN
INTRAMUSCULAR | Status: AC
  Filled 2021-03-07: qty 2

## 2021-03-07 MED ORDER — LACTATED RINGERS IV SOLN: INTRAVENOUS | Status: DC | PRN

## 2021-03-07 MED ORDER — LACTATED RINGERS IV SOLN: INTRAVENOUS | Status: DC

## 2021-03-07 MED ORDER — ONDANSETRON HCL 4 MG/2ML IJ SOLN
INTRAMUSCULAR | Status: AC
  Filled 2021-03-07: qty 2

## 2021-03-07 MED ORDER — DIPHENHYDRAMINE HCL 12.5 MG/5ML PO ELIX: 12.5000 mg | ORAL_SOLUTION | Freq: Four times a day (QID) | ORAL | Status: DC | PRN

## 2021-03-07 MED ORDER — PHENYLEPHRINE 40 MCG/ML (10ML) SYRINGE FOR IV PUSH (FOR BLOOD PRESSURE SUPPORT)
PREFILLED_SYRINGE | INTRAVENOUS | Status: AC
  Filled 2021-03-07: qty 10

## 2021-03-07 MED ORDER — HYDROMORPHONE HCL 1 MG/ML IJ SOLN
0.2500 mg | INTRAMUSCULAR | Status: DC | PRN
  Administered 2021-03-07 (×2): 0.5 mg via INTRAVENOUS

## 2021-03-07 MED ORDER — OXYCODONE HCL 5 MG PO TABS: 5.0000 mg | ORAL_TABLET | Freq: Once | ORAL | Status: DC | PRN

## 2021-03-07 MED ORDER — BUPIVACAINE LIPOSOME 1.3 % IJ SUSP
20.0000 mL | Freq: Once | INTRAMUSCULAR | Status: AC
  Administered 2021-03-07: 20 mL
  Filled 2021-03-07: qty 20

## 2021-03-07 MED ORDER — ORAL CARE MOUTH RINSE: 15.0000 mL | Freq: Once | OROMUCOSAL | Status: AC

## 2021-03-07 MED ORDER — FENTANYL CITRATE (PF) 250 MCG/5ML IJ SOLN
INTRAMUSCULAR | Status: AC
  Filled 2021-03-07: qty 5

## 2021-03-07 MED ORDER — ONDANSETRON HCL 4 MG/2ML IJ SOLN: 4.0000 mg | Freq: Once | INTRAMUSCULAR | Status: DC | PRN

## 2021-03-07 MED ORDER — LIDOCAINE HCL 2 % IJ SOLN
INTRAMUSCULAR | Status: AC
  Filled 2021-03-07: qty 20

## 2021-03-07 MED ORDER — PROMETHAZINE HCL 12.5 MG PO TABS: 12.5000 mg | ORAL_TABLET | ORAL | 0 refills | Status: DC | PRN

## 2021-03-07 MED ORDER — MEPERIDINE HCL 50 MG/ML IJ SOLN
6.2500 mg | INTRAMUSCULAR | Status: DC | PRN
  Administered 2021-03-07: 12.5 mg via INTRAVENOUS

## 2021-03-07 MED ORDER — CHLORHEXIDINE GLUCONATE 0.12 % MT SOLN
15.0000 mL | Freq: Once | OROMUCOSAL | Status: AC
  Administered 2021-03-07: 15 mL via OROMUCOSAL

## 2021-03-07 MED ORDER — CEFAZOLIN SODIUM-DEXTROSE 2-4 GM/100ML-% IV SOLN
2.0000 g | Freq: Once | INTRAVENOUS | Status: AC
  Administered 2021-03-07: 2 g via INTRAVENOUS
  Filled 2021-03-07: qty 100

## 2021-03-07 MED ORDER — BELLADONNA ALKALOIDS-OPIUM 16.2-60 MG RE SUPP: 1.0000 | Freq: Four times a day (QID) | RECTAL | Status: DC | PRN

## 2021-03-07 MED ORDER — STERILE WATER FOR IRRIGATION IR SOLN
Status: DC | PRN
  Administered 2021-03-07: 1000 mL

## 2021-03-07 MED ORDER — ONDANSETRON HCL 4 MG/2ML IJ SOLN
4.0000 mg | INTRAMUSCULAR | Status: DC | PRN
  Administered 2021-03-09 (×2): 4 mg via INTRAVENOUS
  Filled 2021-03-07 (×2): qty 2

## 2021-03-07 MED ORDER — FENTANYL CITRATE (PF) 100 MCG/2ML IJ SOLN
INTRAMUSCULAR | Status: DC | PRN
  Administered 2021-03-07: 100 ug via INTRAVENOUS
  Administered 2021-03-07 (×5): 50 ug via INTRAVENOUS

## 2021-03-07 MED ORDER — ACETAMINOPHEN 10 MG/ML IV SOLN
1000.0000 mg | Freq: Once | INTRAVENOUS | Status: DC | PRN
  Administered 2021-03-07: 1000 mg via INTRAVENOUS

## 2021-03-07 MED ORDER — SODIUM CHLORIDE (PF) 0.9 % IJ SOLN
INTRAMUSCULAR | Status: AC
  Filled 2021-03-07: qty 20

## 2021-03-07 MED ORDER — PROPOFOL 10 MG/ML IV BOLUS
INTRAVENOUS | Status: DC | PRN
  Administered 2021-03-07: 140 mg via INTRAVENOUS

## 2021-03-07 MED ORDER — METHIMAZOLE 10 MG PO TABS
10.0000 mg | ORAL_TABLET | Freq: Every day | ORAL | Status: DC
  Administered 2021-03-08 – 2021-03-11 (×4): 10 mg via ORAL
  Filled 2021-03-07 (×4): qty 1

## 2021-03-07 MED ORDER — FENTANYL CITRATE (PF) 100 MCG/2ML IJ SOLN
INTRAMUSCULAR | Status: AC
  Filled 2021-03-07: qty 2

## 2021-03-07 MED ORDER — CEFAZOLIN SODIUM-DEXTROSE 1-4 GM/50ML-% IV SOLN
1.0000 g | Freq: Three times a day (TID) | INTRAVENOUS | Status: AC
  Administered 2021-03-07 – 2021-03-08 (×2): 1 g via INTRAVENOUS
  Filled 2021-03-07 (×2): qty 50

## 2021-03-07 MED ORDER — OXYCODONE HCL 5 MG/5ML PO SOLN: 5.0000 mg | Freq: Once | ORAL | Status: DC | PRN

## 2021-03-07 MED ORDER — MIDAZOLAM HCL 2 MG/2ML IJ SOLN
INTRAMUSCULAR | Status: DC | PRN
  Administered 2021-03-07: 2 mg via INTRAVENOUS

## 2021-03-07 MED ORDER — TRAMADOL HCL 50 MG PO TABS
50.0000 mg | ORAL_TABLET | Freq: Four times a day (QID) | ORAL | Status: DC | PRN
  Administered 2021-03-07 – 2021-03-11 (×7): 100 mg via ORAL
  Filled 2021-03-07 (×7): qty 2

## 2021-03-07 MED ORDER — SUGAMMADEX SODIUM 200 MG/2ML IV SOLN
INTRAVENOUS | Status: DC | PRN
  Administered 2021-03-07: 200 mg via INTRAVENOUS

## 2021-03-07 MED ORDER — LIDOCAINE HCL (PF) 2 % IJ SOLN
INTRAMUSCULAR | Status: DC | PRN
  Administered 2021-03-07: 1.5 mg/kg/h via INTRADERMAL

## 2021-03-07 MED ORDER — DEXTROSE-NACL 5-0.45 % IV SOLN: INTRAVENOUS | Status: DC

## 2021-03-07 MED ORDER — ONDANSETRON HCL 4 MG/2ML IJ SOLN
INTRAMUSCULAR | Status: DC | PRN
  Administered 2021-03-07: 4 mg via INTRAVENOUS

## 2021-03-07 MED ORDER — LIDOCAINE 2% (20 MG/ML) 5 ML SYRINGE
INTRAMUSCULAR | Status: AC
  Filled 2021-03-07: qty 5

## 2021-03-07 SURGICAL SUPPLY — 73 items
ADH SKN CLS APL DERMABOND .7 (GAUZE/BANDAGES/DRESSINGS) ×1
APL LAPSCP 35 DL APL RGD (MISCELLANEOUS) ×1
APL PRP STRL LF DISP 70% ISPRP (MISCELLANEOUS) ×1
APL SRG 32X5 SNPLK LF DISP (MISCELLANEOUS)
APPLICATOR VISTASEAL 35 (MISCELLANEOUS) ×2 IMPLANT
BAG SPEC RTRVL LRG 6X4 10 (ENDOMECHANICALS) ×1
CHLORAPREP W/TINT 26 (MISCELLANEOUS) ×2 IMPLANT
CLIP SUT LAPRA TY ABSORB (SUTURE) ×4 IMPLANT
CLIP VESOLOCK LG 6/CT PURPLE (CLIP) ×2 IMPLANT
CLIP VESOLOCK MED LG 6/CT (CLIP) ×4 IMPLANT
CLIP VESOLOCK XL 6/CT (CLIP) IMPLANT
COVER SURGICAL LIGHT HANDLE (MISCELLANEOUS) ×2 IMPLANT
COVER TIP SHEARS 8 DVNC (MISCELLANEOUS) ×1 IMPLANT
COVER TIP SHEARS 8MM DA VINCI (MISCELLANEOUS) ×2
COVER WAND RF STERILE (DRAPES) IMPLANT
CUTTER ECHEON FLEX ENDO 45 340 (ENDOMECHANICALS) ×1 IMPLANT
DECANTER SPIKE VIAL GLASS SM (MISCELLANEOUS) ×2 IMPLANT
DERMABOND ADVANCED (GAUZE/BANDAGES/DRESSINGS) ×1
DERMABOND ADVANCED .7 DNX12 (GAUZE/BANDAGES/DRESSINGS) ×1 IMPLANT
DRAIN CHANNEL 15F RND FF 3/16 (WOUND CARE) IMPLANT
DRAPE ARM DVNC X/XI (DISPOSABLE) ×4 IMPLANT
DRAPE COLUMN DVNC XI (DISPOSABLE) ×1 IMPLANT
DRAPE DA VINCI XI ARM (DISPOSABLE) ×8
DRAPE DA VINCI XI COLUMN (DISPOSABLE) ×2
DRAPE INCISE IOBAN 66X45 STRL (DRAPES) ×2 IMPLANT
DRAPE SHEET LG 3/4 BI-LAMINATE (DRAPES) ×2 IMPLANT
ELECT PENCIL ROCKER SW 15FT (MISCELLANEOUS) ×2 IMPLANT
ELECT REM PT RETURN 15FT ADLT (MISCELLANEOUS) ×2 IMPLANT
EVACUATOR SILICONE 100CC (DRAIN) IMPLANT
GLOVE SRG 8 PF TXTR STRL LF DI (GLOVE) ×2 IMPLANT
GLOVE SURG ENC MOIS LTX SZ6.5 (GLOVE) ×2 IMPLANT
GLOVE SURG ENC TEXT LTX SZ7.5 (GLOVE) ×4 IMPLANT
GLOVE SURG UNDER POLY LF SZ8 (GLOVE) ×4
GOWN STRL REUS W/TWL LRG LVL3 (GOWN DISPOSABLE) ×2 IMPLANT
GOWN STRL REUS W/TWL XL LVL3 (GOWN DISPOSABLE) ×4 IMPLANT
HEMOSTAT SURGICEL 4X8 (HEMOSTASIS) ×2 IMPLANT
HOLDER FOLEY CATH W/STRAP (MISCELLANEOUS) ×2 IMPLANT
IRRIG SUCT STRYKERFLOW 2 WTIP (MISCELLANEOUS) ×2
IRRIGATION SUCT STRKRFLW 2 WTP (MISCELLANEOUS) ×1 IMPLANT
KIT BASIN OR (CUSTOM PROCEDURE TRAY) ×2 IMPLANT
KIT TURNOVER KIT A (KITS) ×2 IMPLANT
LOOP VESSEL MAXI BLUE (MISCELLANEOUS) IMPLANT
MARKER SKIN DUAL TIP RULER LAB (MISCELLANEOUS) ×2 IMPLANT
NDL INSUFFLATION 14GA 120MM (NEEDLE) ×1 IMPLANT
NEEDLE INSUFFLATION 14GA 120MM (NEEDLE) ×2 IMPLANT
POUCH SPECIMEN RETRIEVAL 10MM (ENDOMECHANICALS) ×2 IMPLANT
PROTECTOR NERVE ULNAR (MISCELLANEOUS) ×4 IMPLANT
RELOAD STAPLE 45 2.6 WHT THIN (STAPLE) IMPLANT
SEAL CANN UNIV 5-8 DVNC XI (MISCELLANEOUS) ×4 IMPLANT
SEAL XI 5MM-8MM UNIVERSAL (MISCELLANEOUS) ×8
SEALANT SURGICAL APPL DUAL CAN (MISCELLANEOUS) IMPLANT
SET TUBE SMOKE EVAC HIGH FLOW (TUBING) ×2 IMPLANT
SOLUTION ELECTROLUBE (MISCELLANEOUS) ×2 IMPLANT
STAPLE RELOAD 45 WHT (STAPLE) ×2 IMPLANT
STAPLE RELOAD 45MM WHITE (STAPLE) ×4
SUT ETHILON 2 0 PSLX (SUTURE) IMPLANT
SUT MNCRL AB 4-0 PS2 18 (SUTURE) ×4 IMPLANT
SUT PDS AB 0 CT1 36 (SUTURE) IMPLANT
SUT V-LOC BARB 180 2/0GR6 GS22 (SUTURE)
SUT VIC AB 1 CT1 36 (SUTURE) ×8 IMPLANT
SUT VIC AB 2-0 CT2 27 (SUTURE) ×2 IMPLANT
SUT VICRYL 0 UR6 27IN ABS (SUTURE) ×1 IMPLANT
SUT VLOC BARB 180 ABS3/0GR12 (SUTURE) ×4
SUTURE V-LC BRB 180 2/0GR6GS22 (SUTURE) IMPLANT
SUTURE VLOC BRB 180 ABS3/0GR12 (SUTURE) ×2 IMPLANT
TOWEL OR 17X26 10 PK STRL BLUE (TOWEL DISPOSABLE) ×2 IMPLANT
TOWEL OR NON WOVEN STRL DISP B (DISPOSABLE) ×2 IMPLANT
TRAY FOLEY MTR SLVR 16FR STAT (SET/KITS/TRAYS/PACK) ×2 IMPLANT
TRAY LAPAROSCOPIC (CUSTOM PROCEDURE TRAY) ×2 IMPLANT
TROCAR BLADELESS OPT 5 100 (ENDOMECHANICALS) IMPLANT
TROCAR UNIVERSAL OPT 12M 100M (ENDOMECHANICALS) IMPLANT
TROCAR XCEL 12X100 BLDLESS (ENDOMECHANICALS) ×2 IMPLANT
WATER STERILE IRR 1000ML POUR (IV SOLUTION) ×2 IMPLANT

## 2021-03-07 NOTE — Transfer of Care (Signed)
Immediate Anesthesia Transfer of Care Note  Patient: Christina [name]  Procedure(s) Performed: XI ROBOTIC ASSITED RADICAL NEPHRECTOMY (Right)  Patient Location: PACU  Anesthesia Type:General  Level of Consciousness: awake, alert  and oriented  Airway & Oxygen Therapy: Patient Spontanous Breathing and Patient connected to face mask oxygen  Post-op Assessment: Report given to RN and Post -op Vital signs reviewed and stable  Post vital signs: Reviewed and stable  Last Vitals:  Vitals Value Taken Time  BP 157/84   Temp    Pulse 86 03/07/21 1445  Resp 11 03/07/21 1445  SpO2 100 % 03/07/21 1445  Vitals shown include unvalidated device data.  Last Pain:  Vitals:   03/07/21 1017  TempSrc:   PainSc: 0-No pain         Complications: No notable events documented.

## 2021-03-07 NOTE — Op Note (Signed)
Operative Note  Preoperative diagnosis:  1.  3.5 cm solid enhancing right renal mass with features concerning for renal cell carcinoma 2.  Solid and enhancing 1.2 cm left renal mass  Postoperative diagnosis: 1.  Same  Procedure(s): 1.  Robot-assisted laparoscopic radical nephrectomy (adrenal sparing)  Surgeon: Ellison Hughs, MD  Assistants: Debbrah Alar, PA-C An assistant was required for this surgical procedure.  The duties of the assistant included but were not limited to suctioning, passing suture, camera manipulation, retraction.  This procedure would not be able to be performed without an Environmental consultant.   Anesthesia:  General  Complications:  None  EBL: 50 mL  Specimens: 1.  Right kidney  Drains/Catheters: 1.  Foley catheter  Intraoperative findings:   The right renal hilum was hemostatic following ligation  Indication:  Christina [name] is a 70 y.o. female with bilateral renal masses with the right mass being significantly larger than the left.  She is here today for a right nephrectomy.  She has been consented for the above procedures, voices understanding and wishes to proceed.  Description of procedure:  After informed consent was signed, the patient was taken back to the operating room and properly anesthetized.  The patient was then placed in the left lateral decubitus position with all pressure points padded.  The abdomen was then prepped and draped in the usual sterile fashion.  A time-out was then performed.     An 8 mm incision was then made lateral to the right rectus muscle at the level of the right 12th rib.  A Veress needle was then used to access the abdominal cavity.  A saline drop test showed no signs of obstruction and aspiration of the Veress needle revealed no blood or sucus.  The abdominal cavity was then insufflated to 15 mmHg.  An 8 mm robotic trocar was then atraumatically inserted into the abdominal cavity.  The robotic camera was then inserted through  the port and inspection of the abdominal cavity revealed no evidence of adjacent organ or vessel injury. We then placed three additional 8 mm robotic ports and a 12 mm assistant port in such as fashion as to triangulate the right renal hilum.  The robot was then docked into postion.   Using a combination of blunt and cold scissors dissection, the hepatic attachments were released from the abdominal sidewall.  A locking grasper was then inserted through the 5 mm sub-xyphoid port and used to retract the posterior surface of the liver more cephalad.  The white line of Toldt along the ascending colon was then incised, allowing [place] to reflect the colon medially and expose the anterior surface of the right kidney.  The duodenum was then Kocherized medially, which abruptly led [place] to the identification of the inferior vena cava.    Once the colon was adequately mobilized, we moved to the lower pole and identified the gonadal vein and ureter.  The gonadal vein was then left running parallel to the vena cava and the right ureter was reflected anteriorly.  Using cautious cautery, the overlying perihilar attachments were then released.  This yielded visualization of the renal hilum, which included a single right renal vein and a single right renal artery.  The perilymphatic tissue surrounding the right renal artery were carefully released so that the right renal artery was fully encircled.    The patient had multiple branches of her right renal artery that were individually isolated and ligated with Hem-o-lok clips.  A 45 mm powered endovascular  stapler was then used to ligate the right renal vein.  The right hilar stump was hemostatic following staple ligation.  Hemoclips were then applied to the proximal aspects of the right ureter, which was then sharply incised.  The remaining perinephric attachments were then incised using electrocautery. Reinspection of the right retroperitoneal space revealed excellent hemostasis.  Once the right kidney was fully mobile, it was placed in an Endo Catch bag and left in the abdominal cavity.   The 12 mm midline assistant port was then closed using the [employer] technique with a 0 Vicryl suture.  A right lower quadrant Gibson incision was then made in the right kidney was removed within the Endo Catch bag.  The fascia of the external and internal oblique were then closed with a running 0 PDS suture.  The skin incisions were then closed using 4-0 Monocryl.  Dermabond was applied to all skin incisions.  Plan: Monitor on the floor overnight.

## 2021-03-07 NOTE — Anesthesia Postprocedure Evaluation (Signed)
Anesthesia Post Note  Patient: Christina [name]  Procedure(s) Performed: XI ROBOTIC ASSITED RADICAL NEPHRECTOMY (Right)     Patient location during evaluation: PACU Anesthesia Type: General Level of consciousness: awake and alert Pain management: pain level controlled Vital Signs Assessment: post-procedure vital signs reviewed and stable Respiratory status: spontaneous breathing, nonlabored ventilation, respiratory function stable and patient connected to nasal cannula oxygen Cardiovascular status: blood pressure returned to baseline and stable Postop Assessment: no apparent nausea or vomiting Anesthetic complications: no   No notable events documented.  Last Vitals:  Vitals:   03/07/21 1445 03/07/21 1500  BP: (!) 162/89 (!) 166/85  Pulse: 86 88  Resp: 18 12  Temp: 37.2 C   SpO2: 100% 100%    Last Pain:  Vitals:   03/07/21 1500  TempSrc:   PainSc: 5                  Barnet Glasgow

## 2021-03-07 NOTE — Anesthesia Procedure Notes (Signed)
Procedure Name: Intubation Date/Time: 03/07/2021 12:11 PM Performed by: Genelle Bal, CRNA Pre-anesthesia Checklist: Patient identified, Emergency Drugs available, Suction available and Patient being monitored Patient Re-evaluated:Patient Re-evaluated prior to induction Oxygen Delivery Method: Circle system utilized Preoxygenation: Pre-oxygenation with 100% oxygen Induction Type: IV induction Ventilation: Mask ventilation without difficulty and Oral airway inserted - appropriate to patient size Laryngoscope Size: Sabra Heck and 2 Grade View: Grade I Tube type: Oral Tube size: 7.0 mm Number of attempts: 1 Airway Equipment and Method: Stylet and Oral airway Placement Confirmation: ETT inserted through vocal cords under direct vision, positive ETCO2 and breath sounds checked- equal and bilateral Secured at: 21 cm Tube secured with: Tape Dental Injury: Teeth and Oropharynx as per pre-operative assessment

## 2021-03-07 NOTE — Discharge Instructions (Addendum)
Activity:  You are encouraged to ambulate frequently (about every hour during waking hours) to help prevent blood clots from forming in your legs or lungs.  However, you should not engage in any heavy lifting (> 10-15 lbs), strenuous activity, or straining. Diet: You should advance your diet as instructed by your physician.  It will be normal to have some bloating, nausea, and abdominal discomfort intermittently. Prescriptions:  You will be provided a prescription for pain medication to take as needed.  If your pain is not severe enough to require the prescription pain medication, you may take extra strength Tylenol instead which will have less side effects.  You should also take a prescribed stool softener to avoid straining with bowel movements as the prescription pain medication may constipate you. Incisions: You may remove your dressing bandages 48 hours after surgery if not removed in the hospital.  You will either have some small staples or special tissue glue at each of the incision sites. Once the bandages are removed (if present), the incisions may stay open to air.  You may start showering (but not soaking or bathing in water) the 2nd day after surgery and the incisions simply need to be patted dry after the shower.  No additional care is needed. What to call us about: You should call the office [phone]) if you develop fever > 101 or develop persistent vomiting.   You should start a stool softner tomorrow (03/12/21) and take daily for 7 days.  If you have not had a bowel movement by 03/12/21 take a laxative as well and repeat the laxative (per package instructions) daily until your bowel function returns to normal.   You may resume Celebrex, aspirin, advil, aleve, vitamins, and supplements 7 days after surgery.

## 2021-03-07 NOTE — H&P (Signed)
Urology Preoperative H&P   Chief Complaint: Bilateral kidney masses  History of Present Illness: Christina [name] is a 70 y.o. female with with bilateral solid and enhancing renal masses (right greater than left). She has a significant past medical history of paroxysmal A. fib, ascending aortic aneurysm, asthma, arthritis, HTN, hyperthyroidism (Grave's disease) and CHF.   Her renal masses were initially identified during a CT angiogram of the chest in January 2020. Subsequent CT of the abdomen and pelvis on 11/01/2020 confirmed the presence of a 3.5 cm right renal mass along with 2 adjacent 1.2 cm left renal masses, each with features concerning for renal cell carcinoma.    Past Medical History:  Diagnosis Date   Acquired dilation of ascending aorta and aortic root ([place])    4cm upper normal on Chest CT 09/2020   Arthritis    PAIN AND OA RIGHT KNEE; S/P LEFT TOTAL KNEE REPLACEMENT 05-07-14 - STILL IN PHYSICAL THERAPY   Asthma    hx of - PT STATES MILD - NO LONGER REQUIRES INHALERS   Cancer ([place])    renal cell carcinoma   CHF (congestive heart failure) ([facility])    Colitis    mild    GERD (gastroesophageal reflux disease)    H/O hiatal hernia    Hypertension    hx of 10 years ago ; PT'S BLOOD PRESSURE RECENTLY ELEVATED WHILE IN HOSP FOR SURG- NOT ON ANY B/P MEDS   Hyperthyroidism    PAF (paroxysmal atrial fibrillation) ([place])     Past Surgical History:  Procedure Laterality Date   KNEE CLOSED REDUCTION Left 06/05/2014   Procedure: CLOSED MANIPULATION KNEE;  Surgeon: Mauri Pole, MD;  Location: [facility];  Service: Orthopedics;  Laterality: Left;   RIGHT/LEFT HEART CATH AND CORONARY ANGIOGRAPHY N/A 05/31/2019   Procedure: RIGHT/LEFT HEART CATH AND CORONARY ANGIOGRAPHY;  Surgeon: Martinique, Peter M, MD;  Location: [facility];  Service: Cardiovascular;  Laterality: N/A;   TOTAL KNEE ARTHROPLASTY Left 05/07/2014   Procedure: LEFT TOTAL KNEE ARTHROPLASTY;  Surgeon: Mauri Pole, MD;  Location: [facility];  Service: Orthopedics;  Laterality: Left;   TOTAL KNEE ARTHROPLASTY Right 06/05/2014   Procedure: RIGHT TOTAL KNEE ARTHROPLASTY;  Surgeon: Mauri Pole, MD;  Location: [facility];  Service: Orthopedics;  Laterality: Right;   WISDOM TEETH EXTRACTIONS      Allergies:  Allergies  Allergen Reactions   Hydrochlorothiazide Other (See Comments) and Hypertension    Wildly fluctuates B/P low-to-high, then high-to-low, in a matter of minutes   Hydrocodone-Acetaminophen Nausea And Vomiting    Patient DOES NOT wish to take EVER again...   Other Nausea And Vomiting and Other (See Comments)    Stronger pain meds (post-op) caused dehydration and constipation    Family History  Problem Relation Age of Onset   Hypothyroidism Mother    Berenice Primas' disease Sister    Diabetes Brother    Diabetes Maternal Aunt     Social History:  reports that she has never smoked. She has never used smokeless tobacco. She reports that she does not drink alcohol and does not use drugs.  ROS: A complete review of systems was performed.  All systems are negative except for pertinent findings as noted.  Physical Exam:  Vital signs in last 24 hours: Temp:  [98.8 F (37.1 C)] 98.8 F (37.1 C) (06/17 0959) Pulse Rate:  [108] 108 (06/17 0959) Resp:  [15] 15 (06/17 0959) BP: (192)/(94) 192/94 (06/17 0959) SpO2:  [100 %] 100 % (  06/17 0959) Weight:  [89.5 kg] 89.5 kg (06/17 0959) Constitutional:  Alert and oriented, No acute distress Cardiovascular: Regular rate and rhythm, No JVD Respiratory: Normal respiratory effort, Lungs clear bilaterally GI: Abdomen is soft, nontender, nondistended, no abdominal masses GU: No CVA tenderness Lymphatic: No lymphadenopathy Neurologic: Grossly intact, no focal deficits Psychiatric: Normal mood and affect  Laboratory Data:  No results for input(s): WBC, HGB, HCT, PLT in the last 72 hours.  No results for input(s): NA, K, CL, GLUCOSE, BUN, CALCIUM, CREATININE in the last 72  hours.  Invalid input(s): CO3   No results found for this or any previous visit (from the past 24 hour(s)). Recent Results (from the past 240 hour(s))  SARS CORONAVIRUS 2 (TAT 6-24 HRS) Nasopharyngeal Nasopharyngeal Swab     Status: None   Collection Time: 03/04/21 12:37 PM   Specimen: Nasopharyngeal Swab  Result Value Ref Range Status   SARS Coronavirus 2 NEGATIVE NEGATIVE Final    Comment: (NOTE) SARS-CoV-2 target nucleic acids are NOT DETECTED.  The SARS-CoV-2 RNA is generally detectable in upper and lower respiratory specimens during the acute phase of infection. Negative results do not preclude SARS-CoV-2 infection, do not rule out co-infections with other pathogens, and should not be used as the sole basis for treatment or other patient management decisions. Negative results must be combined with clinical observations, patient history, and epidemiological information. The expected result is Negative.  Fact Sheet for Patients: SugarRoll.be  Fact Sheet for Healthcare Providers: https://www.woods-mathews.com/  This test is not yet approved or cleared by the [place] FDA and  has been authorized for detection and/or diagnosis of SARS-CoV-2 by FDA under an Emergency Use Authorization (EUA). This EUA will remain  in effect (meaning this test can be used) for the duration of the COVID-19 declaration under Se ction 564(b)(1) of the Act, 21 U.S.C. section 360bbb-3(b)(1), unless the authorization is terminated or revoked sooner.  Performed at [facility], [place] [address] [place], [place] [address]     Renal Function: No results for input(s): CREATININE in the last 168 hours. Estimated Creatinine Clearance: 65.3 mL/min (by C-G formula based on SCr of 0.87 mg/dL).  Radiologic Imaging: CLINICAL DATA:  Characterize right superior pole renal lesion   EXAM: CT ABDOMEN AND PELVIS WITHOUT AND WITH CONTRAST    TECHNIQUE: Multidetector CT imaging of the abdomen and pelvis was performed following the standard protocol before and following the bolus administration of intravenous contrast.   CONTRAST:  46mL ISOVUE-370 IOPAMIDOL (ISOVUE-370) INJECTION 76%   COMPARISON:  CT angiogram, 10/08/2020   FINDINGS: Lower chest: No acute abnormality.   Hepatobiliary: No solid liver abnormality is seen. No gallstones, gallbladder wall thickening, or biliary dilatation.   Pancreas: Unremarkable. No pancreatic ductal dilatation or surrounding inflammatory changes.   Spleen: Normal in size without significant abnormality.   Adrenals/Urinary Tract: Adrenal glands are unremarkable. Exophytic, heterogeneously contrast enhancing mass of the superior pole of the right kidney measuring 3.5 x 3.2 x 3.4 cm (series 10, image 57). There is an additional small, heterogeneously contrast enhancing mass of the lateral midportion of the left kidney measuring 1.2 x 1.2 x 1.1 cm (series 10, image 68). There is an additional, significantly more subtle, although heterogeneously contrast enhancing lesion of the posterior midportion of the left kidney measuring 1.3 x 1.0 x 1.3 cm (series 19, image 70). Additional benign cyst of the inferior pole of the right kidney. Bladder is unremarkable.   Stomach/Bowel: Stomach is within normal limits. Appendix appears  normal. No evidence of bowel wall thickening, distention, or inflammatory changes.   Vascular/Lymphatic: Aortic atherosclerosis. No enlarged abdominal or pelvic lymph nodes.   Reproductive: No mass or other significant abnormality.   Other: No abdominal wall hernia or abnormality. No abdominopelvic ascites.   Musculoskeletal: No acute or significant osseous findings.   IMPRESSION: 1. Exophytic, heterogeneously contrast enhancing mass of the superior pole of the right kidney measuring 3.5 x 3.2 x 3.4 cm, consistent with renal cell carcinoma. 2. There is an  additional small, heterogeneously contrast enhancing mass of the lateral midportion of the left kidney measuring 1.2 x 1.2 x 1.1 cm. There is an additional, significantly more subtle, although heterogeneously contrast enhancing lesion of the posterior midportion of the left kidney measuring 1.3 x 1.0 x 1.3 cm. These are also consistent with small contralateral renal cell carcinomas. 3. No evidence of renal vein invasion, lymphadenopathy, or metastatic disease in the abdomen or pelvis. 4. Aortic atherosclerosis.   Aortic Atherosclerosis (ICD10-I70.0).     Electronically Signed   By: Eddie Candle M.D.   On: 11/02/2020 15:50  I independently reviewed the above imaging studies.  Assessment and Plan Christina [name] is a 70 y.o. female with bilateral R>L renal lesions with features concerning for renal cell carcinoma  -CT results and images reviewed with the patient. Given the close proximity of her right renal mass to the renal vasculature, a partial nephrectomy would be very technically challenging due to the potential compromise of renal blood flow and difficulty in repairing the renal defect. My recommendation would be to proceed with a right robotic radical nephrectomy and then potentially proceed with surveillance and potentially cryoablation of the small left renal mass is identified on her most recent scan.   -I reviewed imaging results and films with the patient personally. We discussed that the mass in question has features concerning for malignancy. I explained the natural history of presumed renal cell carcinoma. I reviewed the AUA guidelines for evaluation and treatment of renal masses. The options of active surveillance, in situ tumor ablation, partial and radical nephrectomy was discussed. The risks of robotic laparoscopic RIGHT radical nephrectomy were discussed in detail including but not limited to: negative pathology, open conversion, infection of the skin/abdominal cavity, VTE,  MI/CVA, lymphatic leak, injury to adjacent solid/hollow viscus organs, bleeding requiring a blood transfusion, catastrophic bleeding, hernia formation and other imponderables. The patient voices understanding and wishes to proceed.    Ellison Hughs, MD 03/07/2021, 11:43 AM  [facility] Pager: [phone]

## 2021-03-08 ENCOUNTER — Encounter (HOSPITAL_COMMUNITY): Payer: Self-pay | Admitting: Urology

## 2021-03-08 LAB — BASIC METABOLIC PANEL
Anion gap: 9 (ref 5–15)
BUN: 13 mg/dL (ref 8–23)
CO2: 24 mmol/L (ref 22–32)
Calcium: 9.2 mg/dL (ref 8.9–10.3)
Chloride: 100 mmol/L (ref 98–111)
Creatinine, Ser: 1.25 mg/dL — ABNORMAL HIGH (ref 0.44–1.00)
GFR, Estimated: 47 mL/min — ABNORMAL LOW (ref >60)
Glucose, Bld: 173 mg/dL — ABNORMAL HIGH (ref 70–99)
Potassium: 4.2 mmol/L (ref 3.5–5.1)
Sodium: 133 mmol/L — ABNORMAL LOW (ref 135–145)

## 2021-03-08 LAB — HEMOGLOBIN AND HEMATOCRIT, BLOOD
HCT: 38.3 % (ref 36.0–46.0)
Hemoglobin: 12.6 g/dL (ref 12.0–15.0)

## 2021-03-08 MED ORDER — CHLORHEXIDINE GLUCONATE CLOTH 2 % EX PADS
6.0000 | MEDICATED_PAD | Freq: Every day | CUTANEOUS | Status: DC
  Administered 2021-03-08 – 2021-03-10 (×2): 6 via TOPICAL

## 2021-03-08 NOTE — Progress Notes (Signed)
1 Day Post-Op Subjective: Patient reports tolerating PO and pain control good. Positive flatus this morning. Patient has nausea.  Objective: Vital signs in last 24 hours: Temp:  [97.5 F (36.4 C)-98.6 F (37 C)] 98 F (36.7 C) (06/18 2035) Pulse Rate:  [83-99] 83 (06/18 2035) Resp:  [16-20] 20 (06/18 2035) BP: (125-158)/(68-78) 143/71 (06/18 2035) SpO2:  [95 %-100 %] 96 % (06/18 2035)  Intake/Output from previous day: 06/17 0701 - 06/18 0700 In: 3250.7 [I.V.:2600.7; IV Piggyback:500] Out: 705 [Urine:625; Blood:80] Intake/Output this shift: No intake/output data recorded.  Physical Exam:  General:alert, cooperative, and appears stated age GI: soft, non tender, normal bowel sounds, no palpable masses, no organomegaly, no inguinal hernia Female genitalia: not done Extremities: extremities normal, atraumatic, no cyanosis or edema  Lab Results: Recent Labs    03/07/21 1509 03/08/21 0548  HGB 13.1 12.6  HCT 40.8 38.3   BMET Recent Labs    03/08/21 0548  NA 133*  K 4.2  CL 100  CO2 24  GLUCOSE 173*  BUN 13  CREATININE 1.25*  CALCIUM 9.2   No results for input(s): LABPT, INR in the last 72 hours. No results for input(s): LABURIN in the last 72 hours. Results for orders placed or performed during the hospital encounter of 03/04/21  SARS CORONAVIRUS 2 (TAT 6-24 HRS) Nasopharyngeal Nasopharyngeal Swab     Status: None   Collection Time: 03/04/21 12:37 PM   Specimen: Nasopharyngeal Swab  Result Value Ref Range Status   SARS Coronavirus 2 NEGATIVE NEGATIVE Final    Comment: (NOTE) SARS-CoV-2 target nucleic acids are NOT DETECTED.  The SARS-CoV-2 RNA is generally detectable in upper and lower respiratory specimens during the acute phase of infection. Negative results do not preclude SARS-CoV-2 infection, do not rule out co-infections with other pathogens, and should not be used as the sole basis for treatment or other patient management decisions. Negative results  must be combined with clinical observations, patient history, and epidemiological information. The expected result is Negative.  Fact Sheet for Patients: SugarRoll.be  Fact Sheet for Healthcare Providers: https://www.woods-mathews.com/  This test is not yet approved or cleared by the [place] FDA and  has been authorized for detection and/or diagnosis of SARS-CoV-2 by FDA under an Emergency Use Authorization (EUA). This EUA will remain  in effect (meaning this test can be used) for the duration of the COVID-19 declaration under Se ction 564(b)(1) of the Act, 21 U.S.C. section 360bbb-3(b)(1), unless the authorization is terminated or revoked sooner.  Performed at [facility], [place] [address], [place], [place] [address]     Studies/Results: No results found.  Assessment/Plan: POD#1 rigth radical nephrectom D/c foley Ambulate in halls with assistance Continue clear liquid diet Continue current pain control regiment    LOS: 1 day   Christina [name] 03/08/2021, 8:42 PM

## 2021-03-09 NOTE — Progress Notes (Signed)
Tolerating clears. Passing flatus. Hurt to get out of bed (right side and right chest), so she's scared to move. No SOB or CP.   Vitals:   03/08/21 2035 03/09/21 0636  BP: (!) 143/71 (!) 156/80  Pulse: 83 99  Resp: 20 20  Temp: 98 F (36.7 C) 98.4 F (36.9 C)  SpO2: 96% 96%    NAD Watching TV Alert and O CV - RRR Resp - reg effort and depth Abd - soft, NT, Inc C/D/I Ext - no calf pain or swelling   A/P -  SLIV Reg diet Sit in chair, ambulate in halls  Home this PM or AM

## 2021-03-10 ENCOUNTER — Other Ambulatory Visit (HOSPITAL_COMMUNITY): Payer: Self-pay

## 2021-03-10 LAB — BASIC METABOLIC PANEL
Anion gap: 7 (ref 5–15)
BUN: 14 mg/dL (ref 8–23)
CO2: 26 mmol/L (ref 22–32)
Calcium: 9.1 mg/dL (ref 8.9–10.3)
Chloride: 103 mmol/L (ref 98–111)
Creatinine, Ser: 1.01 mg/dL — ABNORMAL HIGH (ref 0.44–1.00)
GFR, Estimated: >60 mL/min (ref >60)
Glucose, Bld: 90 mg/dL (ref 70–99)
Potassium: 3.8 mmol/L (ref 3.5–5.1)
Sodium: 136 mmol/L (ref 135–145)

## 2021-03-10 LAB — HEMOGLOBIN AND HEMATOCRIT, BLOOD
HCT: 33.8 % — ABNORMAL LOW (ref 36.0–46.0)
Hemoglobin: 11.1 g/dL — ABNORMAL LOW (ref 12.0–15.0)

## 2021-03-10 LAB — SURGICAL PATHOLOGY

## 2021-03-10 MED ORDER — ACETAMINOPHEN 325 MG PO TABS
650.0000 mg | ORAL_TABLET | Freq: Four times a day (QID) | ORAL | Status: DC | PRN
  Administered 2021-03-10 – 2021-03-11 (×3): 650 mg via ORAL
  Filled 2021-03-10 (×3): qty 2

## 2021-03-10 MED ORDER — BISACODYL 10 MG RE SUPP
10.0000 mg | Freq: Once | RECTAL | Status: AC
  Administered 2021-03-10: 10 mg via RECTAL
  Filled 2021-03-10: qty 1

## 2021-03-10 NOTE — Progress Notes (Signed)
3 Days Post-Op Subjective: Patient reports tolerating PO and pain control good.  She has not had a BM or flatus since surgery per her report.  She is ambulating some.  Only ate graham crackers and pretzels yesterday.  Voiding well.    Objective: Vital signs in last 24 hours: Temp:  [98.2 F (36.8 C)-98.6 F (37 C)] 98.2 F (36.8 C) (06/20 0513) Pulse Rate:  [96-104] 96 (06/20 0513) Resp:  [20] 20 (06/20 0513) BP: (124-157)/(65-73) 124/73 (06/20 0513) SpO2:  [96 %-100 %] 96 % (06/20 0513)  Intake/Output from previous day: 06/19 0701 - 06/20 0700 In: 883.9 [P.O.:320; I.V.:563.9] Out: 900 [Urine:900] Intake/Output this shift: No intake/output data recorded.  Physical Exam:  General:alert, cooperative, and no distress GI: soft, non tender, normal bowel sounds, no palpable masses Incisions: C/D/I   Lab Results: Recent Labs    03/07/21 1509 03/08/21 0548 03/10/21 0510  HGB 13.1 12.6 11.1*  HCT 40.8 38.3 33.8*   BMET Recent Labs    03/08/21 0548 03/10/21 0510  NA 133* 136  K 4.2 3.8  CL 100 103  CO2 24 26  GLUCOSE 173* 90  BUN 13 14  CREATININE 1.25* 1.01*  CALCIUM 9.2 9.1   No results for input(s): LABPT, INR in the last 72 hours. No results for input(s): LABURIN in the last 72 hours. Results for orders placed or performed during the hospital encounter of 03/04/21  SARS CORONAVIRUS 2 (TAT 6-24 HRS) Nasopharyngeal Nasopharyngeal Swab     Status: None   Collection Time: 03/04/21 12:37 PM   Specimen: Nasopharyngeal Swab  Result Value Ref Range Status   SARS Coronavirus 2 NEGATIVE NEGATIVE Final    Comment: (NOTE) SARS-CoV-2 target nucleic acids are NOT DETECTED.  The SARS-CoV-2 RNA is generally detectable in upper and lower respiratory specimens during the acute phase of infection. Negative results do not preclude SARS-CoV-2 infection, do not rule out co-infections with other pathogens, and should not be used as the sole basis for treatment or other patient  management decisions. Negative results must be combined with clinical observations, patient history, and epidemiological information. The expected result is Negative.  Fact Sheet for Patients: SugarRoll.be  Fact Sheet for Healthcare Providers: https://www.woods-mathews.com/  This test is not yet approved or cleared by the [place] FDA and  has been authorized for detection and/or diagnosis of SARS-CoV-2 by FDA under an Emergency Use Authorization (EUA). This EUA will remain  in effect (meaning this test can be used) for the duration of the COVID-19 declaration under Se ction 564(b)(1) of the Act, 21 U.S.C. section 360bbb-3(b)(1), unless the authorization is terminated or revoked sooner.  Performed at [facility], [place] [address], [place], [place] [address]     Studies/Results: No results found.  Assessment/Plan: 3 Days Post-Op Procedure(s) (LRB): XI ROBOTIC ASSITED RADICAL NEPHRECTOMY (Right) Doing well Ambulate IS Dulcolax supp  PO pain meds only D/C home in am   LOS: 3 days   Christina [name] 03/10/2021, 7:26 AM

## 2021-03-11 MED ORDER — BISACODYL 10 MG RE SUPP
10.0000 mg | Freq: Once | RECTAL | Status: AC
  Administered 2021-03-11: 10 mg via RECTAL
  Filled 2021-03-11: qty 1

## 2021-03-11 NOTE — Care Management Important Message (Signed)
Important Message  Patient Details IM Letter given to the Patient. Name: ALICYA BENA MRN: [id number] Date of Birth: 1951/04/13   Medicare Important Message Given:  Yes     Kerin Salen 03/11/2021, 10:19 AM

## 2021-03-11 NOTE — Progress Notes (Signed)
Discharge instructions discussed with patient and family, verbalized agreement and understanding 

## 2021-03-11 NOTE — Discharge Summary (Signed)
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 Briefly, Christina [name] is a 70 y.o. year old patient with bilateral solid and enhancing renal masses (right greater than left). She has a significant past medical history of paroxysmal A. fib, ascending aortic aneurysm, asthma, arthritis, HTN, hyperthyroidism (Grave's disease) and CHF.   Her renal masses were initially identified during a CT angiogram of the chest in January 2020. Subsequent CT of the abdomen and pelvis on 11/01/2020 confirmed the presence of a 3.5 cm right renal mass along with 2 adjacent 1.2 cm left renal masses, each with features concerning for renal cell carcinoma  Hospital Course: Pt was admitted and taken to the OR on 03/07/21 for robotic assisted lap radical nephrectomy.  Pt tolerated the procedure well and was hemodymically stable immediately post op.  She was extubated without complication and woke up from anesthesia neurologically intact.  Pt was transferred from OR to PACU and then to the floor without issue. Her post op course has progressed as expected. Foley was removed on POD 1 and she has voided without difficulty. Her bowel function has been slow to return but her diet has been advanced and she it tolerating this.  On POD 4 she is passing flatus but has not had a BM yet.  She ambulating without difficulty and is felt stable for d/c home.   Laboratory values:  Recent Labs    03/10/21 0510  HGB 11.1*  HCT 33.8*   Recent Labs    03/10/21 0510  CREATININE 1.01*     Disposition: Home  Discharge instruction: The patient was instructed to be ambulatory but told to refrain from heavy lifting, strenuous activity, or driving.   Discharge medications:  Allergies as of 03/11/2021       Reactions   Hydrochlorothiazide Other (See Comments), Hypertension   Wildly fluctuates B/P low-to-high, then high-to-low, in a matter of minutes   Hydrocodone-acetaminophen Nausea And Vomiting   Patient DOES NOT wish to take EVER again...   Other Nausea And Vomiting, Other (See Comments)   Stronger pain meds (post-op) caused dehydration and constipation        Medication List     STOP taking these medications    Apple Cider Vinegar 600 MG Caps   aspirin EC 325 MG tablet   celecoxib 200 MG capsule Commonly known as: CELEBREX   Flaxseed Oil 1000 MG Caps   Lutein-Zeaxanthin 25-5 MG Caps       TAKE these medications    acetaminophen 650 MG CR tablet Commonly known as: TYLENOL Take 1,300 mg by mouth in the morning. Take 2 tablets (1300 mg) by mouth scheduled every morning & may take an additional dose (1300 mg) in the evening/afternoon if needed for pain.   adapalene 0.1 % gel Commonly known as: DIFFERIN Apply 1 application topically at bedtime.   amlodipine-benazepril 2.5-10 MG capsule Commonly known as: LOTREL Take 1 capsule by mouth daily. What changed: when to take this   carboxymethylcellulose 0.5 % Soln Commonly known as: REFRESH PLUS Place 1-2 drops into both eyes 3 (three) times daily as needed (  dry/irritated eyes.).   Dapsone 5 % topical gel Apply 1 application topically in the morning. Apply to the face daily as directed   docusate sodium 100 MG capsule Commonly known as: COLACE Take 1 capsule (100 mg total) by mouth 2 (two) times daily.   furosemide 20 MG tablet Commonly known as: LASIX Take 1 tablet (20 mg total) by mouth daily. What changed: when to take this   Lidocaine-Menthol Roll-On 4-1 % Liqd Generic drug:  Lidocaine-Menthol Apply 1 application topically 3 (three) times daily as needed (leg pain).   loratadine 10 MG tablet Commonly known as: CLARITIN Take 10 mg by mouth in the morning.   methimazole 5 MG tablet Commonly known as: TAPAZOLE Take 2 tablets by mouth once daily What changed:  how much to take when to take this   promethazine 12.5 MG tablet Commonly known as: PHENERGAN Take 1 tablet (12.5 mg total) by mouth every 4 (four) hours as needed for nausea or vomiting.   traMADol 50 MG tablet Commonly known as: Ultram Take 1-2 tablets (50-100 mg total) by mouth every 6 (six) hours as needed for moderate pain or severe pain.        Followup:   Follow-up Information     Ceasar Mons, MD Follow up on 03/21/2021.   Specialty: Urology Why: at 8:45 Contact information: [address] [place] [address] [phone]

## 2021-03-17 DIAGNOSIS — C642 Malignant neoplasm of left kidney, except renal pelvis: Secondary | ICD-10-CM | POA: Diagnosis not present

## 2021-03-17 DIAGNOSIS — M159 Polyosteoarthritis, unspecified: Secondary | ICD-10-CM | POA: Diagnosis not present

## 2021-03-17 DIAGNOSIS — Z6839 Body mass index (BMI) 39.0-39.9, adult: Secondary | ICD-10-CM | POA: Diagnosis not present

## 2021-03-17 DIAGNOSIS — Z905 Acquired absence of kidney: Secondary | ICD-10-CM | POA: Diagnosis not present

## 2021-03-21 DIAGNOSIS — D49512 Neoplasm of unspecified behavior of left kidney: Secondary | ICD-10-CM | POA: Diagnosis not present

## 2021-03-21 DIAGNOSIS — C641 Malignant neoplasm of right kidney, except renal pelvis: Secondary | ICD-10-CM | POA: Diagnosis not present

## 2021-03-25 ENCOUNTER — Other Ambulatory Visit: Payer: Self-pay | Admitting: Urology

## 2021-03-25 DIAGNOSIS — C641 Malignant neoplasm of right kidney, except renal pelvis: Secondary | ICD-10-CM

## 2021-03-25 DIAGNOSIS — D49512 Neoplasm of unspecified behavior of left kidney: Secondary | ICD-10-CM

## 2021-04-03 DIAGNOSIS — M25362 Other instability, left knee: Secondary | ICD-10-CM | POA: Diagnosis not present

## 2021-04-03 DIAGNOSIS — M159 Polyosteoarthritis, unspecified: Secondary | ICD-10-CM | POA: Diagnosis not present

## 2021-04-03 DIAGNOSIS — F5102 Adjustment insomnia: Secondary | ICD-10-CM | POA: Diagnosis not present

## 2021-04-03 DIAGNOSIS — Z6838 Body mass index (BMI) 38.0-38.9, adult: Secondary | ICD-10-CM | POA: Diagnosis not present

## 2021-04-09 DIAGNOSIS — M25562 Pain in left knee: Secondary | ICD-10-CM | POA: Diagnosis not present

## 2021-04-09 DIAGNOSIS — Z96653 Presence of artificial knee joint, bilateral: Secondary | ICD-10-CM | POA: Diagnosis not present

## 2021-04-18 DIAGNOSIS — C641 Malignant neoplasm of right kidney, except renal pelvis: Secondary | ICD-10-CM | POA: Diagnosis not present

## 2021-04-25 DIAGNOSIS — M5459 Other low back pain: Secondary | ICD-10-CM | POA: Diagnosis not present

## 2021-04-25 DIAGNOSIS — M25561 Pain in right knee: Secondary | ICD-10-CM | POA: Diagnosis not present

## 2021-04-25 DIAGNOSIS — R2689 Other abnormalities of gait and mobility: Secondary | ICD-10-CM | POA: Diagnosis not present

## 2021-04-25 DIAGNOSIS — M6281 Muscle weakness (generalized): Secondary | ICD-10-CM | POA: Diagnosis not present

## 2021-04-25 DIAGNOSIS — M25562 Pain in left knee: Secondary | ICD-10-CM | POA: Diagnosis not present

## 2021-04-30 DIAGNOSIS — M6281 Muscle weakness (generalized): Secondary | ICD-10-CM | POA: Diagnosis not present

## 2021-04-30 DIAGNOSIS — M5459 Other low back pain: Secondary | ICD-10-CM | POA: Diagnosis not present

## 2021-04-30 DIAGNOSIS — M25562 Pain in left knee: Secondary | ICD-10-CM | POA: Diagnosis not present

## 2021-04-30 DIAGNOSIS — R2689 Other abnormalities of gait and mobility: Secondary | ICD-10-CM | POA: Diagnosis not present

## 2021-04-30 DIAGNOSIS — M25561 Pain in right knee: Secondary | ICD-10-CM | POA: Diagnosis not present

## 2021-05-01 ENCOUNTER — Ambulatory Visit (INDEPENDENT_AMBULATORY_CARE_PROVIDER_SITE_OTHER): Payer: Medicare Other | Admitting: Endocrinology

## 2021-05-01 ENCOUNTER — Encounter: Payer: Self-pay | Admitting: Endocrinology

## 2021-05-01 ENCOUNTER — Other Ambulatory Visit: Payer: Self-pay

## 2021-05-01 DIAGNOSIS — E0590 Thyrotoxicosis, unspecified without thyrotoxic crisis or storm: Secondary | ICD-10-CM | POA: Diagnosis not present

## 2021-05-01 DIAGNOSIS — E0500 Thyrotoxicosis with diffuse goiter without thyrotoxic crisis or storm: Secondary | ICD-10-CM | POA: Diagnosis not present

## 2021-05-01 LAB — TSH: TSH: <0.01 u[IU]/mL — ABNORMAL LOW (ref 0.35–5.50)

## 2021-05-01 LAB — T4, FREE: Free T4: 2.13 ng/dL — ABNORMAL HIGH (ref 0.60–1.60)

## 2021-05-01 LAB — T3, FREE: T3, Free: 6.1 pg/mL — ABNORMAL HIGH (ref 2.3–4.2)

## 2021-05-01 NOTE — Progress Notes (Addendum)
Patient ID: Christina [name], female   DOB: 06/28/1951, 70 y.o.   MRN: [id number]                                                                                                               Reason for Appointment:  Hyperthyroidism, follow-up visit   Chief complaint: Follow-up of thyroid   History of Present Illness:   Prior history: For the last several years the patient has had symptoms of occasional palpitations, shortness of breath on exertion, shakiness and feeling somewhat warm.  However she did not have any recent worsening of the symptoms except on the morning of hospitalization She previously did not have any unusual weight loss, anxiety or fatigue She was also having some swelling of her legs prior to admission Since she was having difficulties with tachycardia and atrial fibrillation her thyroid level was checked in the hospital and indicated hyperthyroidism  RECENT HISTORY:  Baseline symptoms before treatment were occasional palpitations, shortness of breath on exertion, shakiness and feeling warm She has Graves' disease confirmed with a baseline thyrotropin receptor antibody level of 3.9  She has been treated with methimazole since 05/2019  Her dose has fluctuated between 5 and 10 mg daily  She appears to be needing periodic adjustments of her doses   She is now taking 7.5 mg daily and the dose was increased in 2/22 when free T3 was high She has lost weight since her nephrectomy  No unusual fatigue lately and does not feel any palpitations or heat or cold intolerance  She has taken her methimazole daily as before Weight is about the same  Thyroid levels pending  Last thyrotropin receptor antibody in 1/22 was higher at 11.2 compared to 9.5   Wt Readings from Last 3 Encounters:  05/01/21 187 lb 3.2 oz (84.9 kg)  03/07/21 197 lb 5 oz (89.5 kg)  02/18/21 197 lb 6.4 oz (89.5 kg)   Thyroid function tests as follows:     Lab Results  Component Value Date   FREET4  1.24 02/14/2021   FREET4 1.03 11/15/2020   FREET4 0.59 (L) 09/26/2020   T3FREE 4.4 (H) 02/14/2021   T3FREE 3.9 01/07/2021   T3FREE 4.4 (H) 11/14/2020   TSH <0.01 (L) 02/14/2021   TSH 0.04 (L) 11/15/2020   TSH 0.22 (L) 09/26/2020    Lab Results  Component Value Date   THYROTRECAB 11.20 (H) 09/26/2020   THYROTRECAB 9.46 (H) 07/15/2020   THYROTRECAB 2.28 (H) 04/15/2020   OPHTHALMOPATHY:  Since about 11/20 she was having more swelling of her eyes, some double vision, and a pressure sensation Subsequently in December she had more fuzzy vision, feeling of fullness in her eyes and continued double vision She went to the urgent care center and was given a Medrol Dosepak  With the recurrence of the symptoms in 1/21 she was again started on tapering dose of prednisone Has been treated by oculoplastic ophthalmologist and she had been on Medrol high-dose weekly injections started in late February with only mild and short  lasting relief  She had a complete the course of Tepezza  infusions  This improved her vision and has reduced the swelling and fullness in her eyes Overall doing well now  She uses refresh drops for dryness  CT scan of orbits has not shown any proptosis and only thickening of the eye muscles which has improved    Allergies as of 05/01/2021       Reactions   Hydrochlorothiazide Other (See Comments), Hypertension   Wildly fluctuates B/P low-to-high, then high-to-low, in a matter of minutes   Hydrocodone-acetaminophen Nausea And Vomiting   Patient DOES NOT wish to take EVER again...   Other Nausea And Vomiting, Other (See Comments)   Stronger pain meds (post-op) caused dehydration and constipation        Medication List        Accurate as of May 01, 2021  4:15 PM. If you have any questions, ask your nurse or doctor.          acetaminophen 650 MG CR tablet Commonly known as: TYLENOL Take 1,300 mg by mouth in the morning. Take 2 tablets (1300 mg) by  mouth scheduled every morning & may take an additional dose (1300 mg) in the evening/afternoon if needed for pain.   adapalene 0.1 % gel Commonly known as: DIFFERIN Apply 1 application topically at bedtime.   amlodipine-benazepril 2.5-10 MG capsule Commonly known as: LOTREL Take 1 capsule by mouth daily.   carboxymethylcellulose 0.5 % Soln Commonly known as: REFRESH PLUS Place 1-2 drops into both eyes 3 (three) times daily as needed (dry/irritated eyes.).   Dapsone 5 % topical gel Apply 1 application topically in the morning. Apply to the face daily as directed   docusate sodium 100 MG capsule Commonly known as: COLACE Take 1 capsule (100 mg total) by mouth 2 (two) times daily.   furosemide 20 MG tablet Commonly known as: LASIX Take 1 tablet (20 mg total) by mouth daily.   Lidocaine-Menthol Roll-On 4-1 % Liqd Generic drug: Lidocaine-Menthol Apply 1 application topically 3 (three) times daily as needed (leg pain).   loratadine 10 MG tablet Commonly known as: CLARITIN Take 10 mg by mouth in the morning.   methimazole 5 MG tablet Commonly known as: TAPAZOLE Take 2 tablets by mouth once daily   promethazine 12.5 MG tablet Commonly known as: PHENERGAN Take 1 tablet (12.5 mg total) by mouth every 4 (four) hours as needed for nausea or vomiting.   traMADol 50 MG tablet Commonly known as: Ultram Take 1-2 tablets (50-100 mg total) by mouth every 6 (six) hours as needed for moderate pain or severe pain.            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 - STILL IN PHYSICAL THERAPY   Asthma    hx of - PT STATES MILD - NO LONGER REQUIRES INHALERS   Cancer ([facility])    renal cell carcinoma   CHF (congestive heart failure) (HCC)    Colitis    mild    GERD (gastroesophageal reflux disease)    H/O  hiatal hernia    Hypertension    hx of 10 years ago ; PT'S BLOOD PRESSURE RECENTLY ELEVATED WHILE IN HOSP FOR SURG- NOT ON ANY B/P MEDS   Hyperthyroidism  PAF (paroxysmal atrial fibrillation) [facility])     Past Surgical History:  Procedure Laterality Date   KNEE CLOSED REDUCTION Left 06/05/2014   Procedure: CLOSED MANIPULATION KNEE;  Surgeon: Mauri Pole, MD;  Location: [facility];  Service: Orthopedics;  Laterality: Left;   RIGHT/LEFT HEART CATH AND CORONARY ANGIOGRAPHY N/A 05/31/2019   Procedure: RIGHT/LEFT HEART CATH AND CORONARY ANGIOGRAPHY;  Surgeon: Martinique, Peter M, MD;  Location: [facility];  Service: Cardiovascular;  Laterality: N/A;   ROBOTIC ASSITED PARTIAL NEPHRECTOMY Right 03/07/2021   Procedure: XI ROBOTIC ASSITED RADICAL NEPHRECTOMY;  Surgeon: Ceasar Mons, MD;  Location: [facility];  Service: Urology;  Laterality: Right;   TOTAL KNEE ARTHROPLASTY Left 05/07/2014   Procedure: LEFT TOTAL KNEE ARTHROPLASTY;  Surgeon: Mauri Pole, MD;  Location: [facility];  Service: Orthopedics;  Laterality: Left;   TOTAL KNEE ARTHROPLASTY Right 06/05/2014   Procedure: RIGHT TOTAL KNEE ARTHROPLASTY;  Surgeon: Mauri Pole, MD;  Location: [facility];  Service: Orthopedics;  Laterality: Right;   WISDOM TEETH EXTRACTIONS      Family History  Problem Relation Age of Onset   Hypothyroidism Mother    Berenice Primas' disease Sister    Diabetes Brother    Diabetes Maternal Aunt     Social History:  reports that she has never smoked. She has never used smokeless tobacco. She reports that she does not drink alcohol and does not use drugs.  Allergies:  Allergies  Allergen Reactions   Hydrochlorothiazide Other (See Comments) and Hypertension    Wildly fluctuates B/P low-to-high, then high-to-low, in a matter of minutes   Hydrocodone-Acetaminophen Nausea And Vomiting    Patient DOES NOT wish to take EVER again...   Other Nausea And Vomiting and Other (See Comments)    Stronger pain meds (post-op)  caused dehydration and constipation     Review of Systems  Essential hypertension:  Her PCP has had her on Lotrel   BP Readings from Last 3 Encounters:  05/01/21 118/88  03/11/21 124/82  02/24/21 (!) 143/78       Examination:   BP 118/88   Pulse (!) 103   Ht '5\' 2"'$  (1.575 m)   Wt 187 lb 3.2 oz (84.9 kg)   SpO2 98%   BMI 34.24 kg/m   Thyroid not palpable No tremor Hands not unusually warm Biceps reflexes slightly brisk   Assessment/Plan:   Hyperthyroidism, from Graves' disease with baseline thyrotropin receptor antibody of 3.9  She has been on methimazole since early September 2020   She has had frequent adjustments of her methimazole dose although usually between 5 to 10 mg a day  Last thyrotropin receptor antibody was still high  She has not followed up since her methimazole was increased but subjectively doing well However still not completely recovered from her nephrectomy  As before her exam is unremarkable  Labs to be done today for thyroid functions including free T3 level and thyrotropin receptor antibody and will decide on the methimazole dosage Also discussed that if her thyrotropin receptor antibody is not improving or going up may consider thyroidectomy at some point   Elayne Snare 05/01/2021, 4:15 PM    Note: This office note was prepared with Dragon voice recognition system technology. Any transcriptional errors that result from this process are unintentional.  Addendum:  Thyroid level is significantly higher, instead of taking 1-1/2 tablets of 5 mg methimazole daily need to change the dose to 1-1/2 tablets in the morning and 1 tablet in the evening  Elayne Snare

## 2021-05-02 DIAGNOSIS — E0590 Thyrotoxicosis, unspecified without thyrotoxic crisis or storm: Secondary | ICD-10-CM

## 2021-05-02 LAB — THYROTROPIN RECEPTOR AUTOABS: Thyrotropin Receptor Ab: 26.8 IU/L — ABNORMAL HIGH (ref 0.00–1.75)

## 2021-05-06 ENCOUNTER — Telehealth: Payer: Self-pay | Admitting: Endocrinology

## 2021-05-06 MED ORDER — METHIMAZOLE 5 MG PO TABS
7.5000 mg | ORAL_TABLET | Freq: Every day | ORAL | 0 refills | Status: DC
Start: 2021-05-06 — End: 2021-07-30

## 2021-05-06 NOTE — Telephone Encounter (Signed)
[facility] is needing clarification on the refill that was sent in for patient for methimazole  the pt was taking was 2 tabs a day and the new script says 1.5 and then one at bed time. [facility] is just neeing clarification    methimazole (TAPAZOLE) 5 MG tablet    [facility] [phone]

## 2021-05-06 NOTE — Telephone Encounter (Signed)
Sam with [facility] requests to be called at ph# [phone] re: Clarification of RX received for methimazole (TAPAZOLE) 5 MG tablet

## 2021-05-07 NOTE — Telephone Encounter (Signed)
I called pharmacy to clarify Rx

## 2021-05-07 NOTE — Progress Notes (Signed)
Notified pt with lab results and medication instructions by Dr. Dwyane Derita. Pt voiced understanding.

## 2021-05-08 DIAGNOSIS — M5459 Other low back pain: Secondary | ICD-10-CM | POA: Diagnosis not present

## 2021-05-08 DIAGNOSIS — M6281 Muscle weakness (generalized): Secondary | ICD-10-CM | POA: Diagnosis not present

## 2021-05-08 DIAGNOSIS — M25561 Pain in right knee: Secondary | ICD-10-CM | POA: Diagnosis not present

## 2021-05-08 DIAGNOSIS — M25562 Pain in left knee: Secondary | ICD-10-CM | POA: Diagnosis not present

## 2021-05-08 DIAGNOSIS — R2689 Other abnormalities of gait and mobility: Secondary | ICD-10-CM | POA: Diagnosis not present

## 2021-05-15 DIAGNOSIS — M25561 Pain in right knee: Secondary | ICD-10-CM | POA: Diagnosis not present

## 2021-05-15 DIAGNOSIS — M25562 Pain in left knee: Secondary | ICD-10-CM | POA: Diagnosis not present

## 2021-05-15 DIAGNOSIS — R2689 Other abnormalities of gait and mobility: Secondary | ICD-10-CM | POA: Diagnosis not present

## 2021-05-15 DIAGNOSIS — M5459 Other low back pain: Secondary | ICD-10-CM | POA: Diagnosis not present

## 2021-05-15 DIAGNOSIS — M6281 Muscle weakness (generalized): Secondary | ICD-10-CM | POA: Diagnosis not present

## 2021-05-22 DIAGNOSIS — M25561 Pain in right knee: Secondary | ICD-10-CM | POA: Diagnosis not present

## 2021-05-22 DIAGNOSIS — M5459 Other low back pain: Secondary | ICD-10-CM | POA: Diagnosis not present

## 2021-05-22 DIAGNOSIS — M25562 Pain in left knee: Secondary | ICD-10-CM | POA: Diagnosis not present

## 2021-05-22 DIAGNOSIS — M6281 Muscle weakness (generalized): Secondary | ICD-10-CM | POA: Diagnosis not present

## 2021-05-22 DIAGNOSIS — R2689 Other abnormalities of gait and mobility: Secondary | ICD-10-CM | POA: Diagnosis not present

## 2021-06-05 DIAGNOSIS — Z23 Encounter for immunization: Secondary | ICD-10-CM | POA: Diagnosis not present

## 2021-06-12 ENCOUNTER — Ambulatory Visit (INDEPENDENT_AMBULATORY_CARE_PROVIDER_SITE_OTHER): Payer: Medicare Other | Admitting: Endocrinology

## 2021-06-12 ENCOUNTER — Other Ambulatory Visit (INDEPENDENT_AMBULATORY_CARE_PROVIDER_SITE_OTHER): Payer: Medicare Other

## 2021-06-12 ENCOUNTER — Other Ambulatory Visit: Payer: Self-pay

## 2021-06-12 ENCOUNTER — Encounter: Payer: Self-pay | Admitting: Endocrinology

## 2021-06-12 VITALS — BP 124/80 | HR 78 | Ht 62.0 in | Wt 186.6 lb

## 2021-06-12 DIAGNOSIS — E0590 Thyrotoxicosis, unspecified without thyrotoxic crisis or storm: Secondary | ICD-10-CM

## 2021-06-12 LAB — CBC WITH DIFFERENTIAL/PLATELET
Basophils Absolute: 0 10*3/uL (ref 0.0–0.1)
Basophils Relative: 0.8 % (ref 0.0–3.0)
Eosinophils Absolute: 0.2 10*3/uL (ref 0.0–0.7)
Eosinophils Relative: 3.7 % (ref 0.0–5.0)
HCT: 37.7 % (ref 36.0–46.0)
Hemoglobin: 12.5 g/dL (ref 12.0–15.0)
Lymphocytes Relative: 32.7 % (ref 12.0–46.0)
Lymphs Abs: 1.7 10*3/uL (ref 0.7–4.0)
MCHC: 33.2 g/dL (ref 30.0–36.0)
MCV: 87.9 fl (ref 78.0–100.0)
Monocytes Absolute: 0.5 10*3/uL (ref 0.1–1.0)
Monocytes Relative: 10.5 % (ref 3.0–12.0)
Neutro Abs: 2.7 10*3/uL (ref 1.4–7.7)
Neutrophils Relative %: 52.3 % (ref 43.0–77.0)
Platelets: 293 10*3/uL (ref 150.0–400.0)
RBC: 4.28 Mil/uL (ref 3.87–5.11)
RDW: 14.4 % (ref 11.5–15.5)
WBC: 5.2 10*3/uL (ref 4.0–10.5)

## 2021-06-12 LAB — TSH: TSH: <0.01 u[IU]/mL — ABNORMAL LOW (ref 0.35–5.50)

## 2021-06-12 LAB — T4, FREE: Free T4: 0.85 ng/dL (ref 0.60–1.60)

## 2021-06-12 LAB — T3, FREE: T3, Free: 3.9 pg/mL (ref 2.3–4.2)

## 2021-06-12 LAB — ALT: ALT: 8 U/L (ref 0–35)

## 2021-06-12 NOTE — Progress Notes (Signed)
Patient ID: Christina [name], female   DOB: 1951-01-24, 70 y.o.   MRN: [id number]                                                                                                               Reason for Appointment:  Hyperthyroidism, follow-up visit   Chief complaint: Follow-up of thyroid   History of Present Illness:   Prior history: For the last several years the patient has had symptoms of occasional palpitations, shortness of breath on exertion, shakiness and feeling somewhat warm.  However she did not have any recent worsening of the symptoms except on the morning of hospitalization She previously did not have any unusual weight loss, anxiety or fatigue She was also having some swelling of her legs prior to admission Since she was having difficulties with tachycardia and atrial fibrillation her thyroid level was checked in the hospital and indicated hyperthyroidism  RECENT HISTORY:  Baseline symptoms before treatment were occasional palpitations, shortness of breath on exertion, shakiness and feeling warm She has Graves' disease confirmed with a baseline thyrotropin receptor antibody level of 3.9  She has been treated with methimazole since 05/2019  Her dose has fluctuated between 5 and 10 mg daily  She ambulated periodic adjustments of her dose   She is now taking 7.5 mg in the morning and 5 mg in the evening and the dose was increased in 8/22 when her thyroid levels were considerably higher Her weight has leveled off  No unusual fatigue completely and no palpitations or heat intolerance  However she feels a little shakiness of her feet but not hands  She has been regular with her methimazole  Thyroid levels pending  Last thyrotropin receptor antibody in 1/22 was higher at 11.2 compared to 9.5   Wt Readings from Last 3 Encounters:  06/12/21 186 lb 9.6 oz (84.6 kg)  05/01/21 187 lb 3.2 oz (84.9 kg)  03/07/21 197 lb 5 oz (89.5 kg)   Thyroid function tests as follows:      Lab Results  Component Value Date   FREET4 2.13 (H) 05/01/2021   FREET4 1.24 02/14/2021   FREET4 1.03 11/15/2020   T3FREE 6.1 (H) 05/01/2021   T3FREE 4.4 (H) 02/14/2021   T3FREE 3.9 01/07/2021   TSH <0.01 (L) 05/01/2021   TSH <0.01 (L) 02/14/2021   TSH 0.04 (L) 11/15/2020    Lab Results  Component Value Date   THYROTRECAB 26.80 (H) 05/01/2021   THYROTRECAB 11.20 (H) 09/26/2020   THYROTRECAB 9.46 (H) 07/15/2020   OPHTHALMOPATHY:  Since about 11/20 she was having more swelling of her eyes, some double vision, and a pressure sensation Subsequently in December she had more fuzzy vision, feeling of fullness in her eyes and continued double vision She went to the urgent care center and was given a Medrol Dosepak  With the recurrence of the symptoms in 1/21 she was again started on tapering dose of prednisone Has been treated by oculoplastic ophthalmologist and she had been on Medrol high-dose weekly injections started in  late February with only mild and short lasting relief  She had a complete the course of [place]  infusions  This improved her vision and has reduced the swelling and fullness in her eyes  Recently she is complaining about her eyes looking glassy as well as feeling of the eyes bulging more  She says she is concerned about inflammation in her eyes and is taking ibuprofen around She does not want any steroids because she thinks this will make her optic nerve atrophy  She uses refresh drops for dryness  CT scan of orbits has not shown any proptosis and only thickening of the eye muscles which has improved    Allergies as of 06/12/2021       Reactions   Hydrochlorothiazide Other (See Comments), Hypertension   Wildly fluctuates B/P low-to-high, then high-to-low, in a matter of minutes   Hydrocodone-acetaminophen Nausea And Vomiting   Patient DOES NOT wish to take EVER again...   Other Nausea And Vomiting, Other (See Comments)   Stronger pain meds (post-op)  caused dehydration and constipation        Medication List        Accurate as of June 12, 2021  2:10 PM. If you have any questions, ask your nurse or doctor.          acetaminophen 650 MG CR tablet Commonly known as: TYLENOL Take 1,300 mg by mouth in the morning. Take 2 tablets (1300 mg) by mouth scheduled every morning & may take an additional dose (1300 mg) in the evening/afternoon if needed for pain.   adapalene 0.1 % gel Commonly known as: DIFFERIN Apply 1 application topically at bedtime.   amlodipine-benazepril 2.5-10 MG capsule Commonly known as: LOTREL Take 1 capsule by mouth daily.   carboxymethylcellulose 0.5 % Soln Commonly known as: REFRESH PLUS Place 1-2 drops into both eyes 3 (three) times daily as needed (dry/irritated eyes.).   Dapsone 5 % topical gel Apply 1 application topically in the morning. Apply to the face daily as directed   docusate sodium 100 MG capsule Commonly known as: COLACE Take 1 capsule (100 mg total) by mouth 2 (two) times daily.   furosemide 20 MG tablet Commonly known as: LASIX Take 1 tablet (20 mg total) by mouth daily.   IBUPROFEN PO Take by mouth.   Lidocaine-Menthol Roll-On 4-1 % Liqd Generic drug: Lidocaine-Menthol Apply 1 application topically 3 (three) times daily as needed (leg pain).   loratadine 10 MG tablet Commonly known as: CLARITIN Take 10 mg by mouth in the morning.   methimazole 5 MG tablet Commonly known as: TAPAZOLE Take 1.5 tablets (7.5 mg total) by mouth daily. and 1 tablet in the evening   promethazine 12.5 MG tablet Commonly known as: PHENERGAN Take 1 tablet (12.5 mg total) by mouth every 4 (four) hours as needed for nausea or vomiting.   sulfamethoxazole-trimethoprim 400-80 MG tablet Commonly known as: BACTRIM Take 1 tablet by mouth daily.   traMADol 50 MG tablet Commonly known as: Ultram Take 1-2 tablets (50-100 mg total) by mouth every 6 (six) hours as needed for moderate pain or  severe pain.            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 - STILL IN PHYSICAL THERAPY   Asthma  hx of - PT STATES MILD - NO LONGER REQUIRES INHALERS   Cancer ([place])    renal cell carcinoma   CHF (congestive heart failure) (HCC)    Colitis    mild    GERD (gastroesophageal reflux disease)    H/O hiatal hernia    Hypertension    hx of 10 years ago ; PT'S BLOOD PRESSURE RECENTLY ELEVATED WHILE IN HOSP FOR SURG- NOT ON ANY B/P MEDS   Hyperthyroidism    PAF (paroxysmal atrial fibrillation) ([place])     Past Surgical History:  Procedure Laterality Date   KNEE CLOSED REDUCTION Left 06/05/2014   Procedure: CLOSED MANIPULATION KNEE;  Surgeon: Mauri Pole, MD;  Location: [facility];  Service: Orthopedics;  Laterality: Left;   RIGHT/LEFT HEART CATH AND CORONARY ANGIOGRAPHY N/A 05/31/2019   Procedure: RIGHT/LEFT HEART CATH AND CORONARY ANGIOGRAPHY;  Surgeon: Martinique, Peter M, MD;  Location: [facility];  Service: Cardiovascular;  Laterality: N/A;   ROBOTIC ASSITED PARTIAL NEPHRECTOMY Right 03/07/2021   Procedure: XI ROBOTIC ASSITED RADICAL NEPHRECTOMY;  Surgeon: Ceasar Mons, MD;  Location: [facility];  Service: Urology;  Laterality: Right;   TOTAL KNEE ARTHROPLASTY Left 05/07/2014   Procedure: LEFT TOTAL KNEE ARTHROPLASTY;  Surgeon: Mauri Pole, MD;  Location: [facility];  Service: Orthopedics;  Laterality: Left;   TOTAL KNEE ARTHROPLASTY Right 06/05/2014   Procedure: RIGHT TOTAL KNEE ARTHROPLASTY;  Surgeon: Mauri Pole, MD;  [facility]: [facility];  Service: Orthopedics;  Laterality: Right;   WISDOM TEETH EXTRACTIONS      Family History  Problem Relation Age of Onset   Hypothyroidism Mother    Berenice Primas' disease Sister    Diabetes Brother    Diabetes Maternal Aunt     Social History:  reports that  she has never smoked. She has never used smokeless tobacco. She reports that she does not drink alcohol and does not use drugs.  Allergies:  Allergies  Allergen Reactions   Hydrochlorothiazide Other (See Comments) and Hypertension    Wildly fluctuates B/P low-to-high, then high-to-low, in a matter of minutes   Hydrocodone-Acetaminophen Nausea And Vomiting    Patient DOES NOT wish to take EVER again...   Other Nausea And Vomiting and Other (See Comments)    Stronger pain meds (post-op) caused dehydration and constipation     Review of Systems  Essential hypertension: Treated by cardiologist with Lotrel   BP Readings from Last 3 Encounters:  06/12/21 124/80  05/01/21 118/88  03/11/21 124/82       Examination:   BP 124/80   Pulse 78   Ht 5\' 2"  (1.575 m)   Wt 186 lb 9.6 oz (84.6 kg)   SpO2 98%   BMI 34.13 kg/m   She has puffiness of the left eye but no obvious proptosis or chemosis Thyroid not palpable No tremor Skin appears warm Biceps reflexes show normal relaxation   Assessment/Plan:   Hyperthyroidism, from Graves' disease with baseline thyrotropin receptor antibody of 3.9  She has been on methimazole since early September 2020   She has had frequent adjustments of her methimazole dose although usually between 5 to 10 mg a day  Last thyrotropin receptor antibody was increasing  She has subjectively felt well except for prominence of her eyes on Exam he looks euthyroid  Labs to be done today for thyroid functions and also CBC and ALT   She was asked to talk to the ophthalmologist about her eyes since she may need further treatment but  also establish proper diagnosis  She will hold off on thyroidectomy until after she is resolved her kidney cancer issues   Elayne Snare 06/12/2021, 2:10 PM    Note: This office note was prepared with Dragon voice recognition system technology. Any transcriptional errors that result from this process are unintentional.

## 2021-07-25 ENCOUNTER — Ambulatory Visit
Admission: RE | Admit: 2021-07-25 | Discharge: 2021-07-25 | Disposition: A | Discharge status: Home / Self Care | Payer: Medicare Other | Source: Ambulatory Visit | Attending: Urology | Admitting: Urology

## 2021-07-25 ENCOUNTER — Other Ambulatory Visit: Payer: Medicare Other

## 2021-07-25 DIAGNOSIS — C641 Malignant neoplasm of right kidney, except renal pelvis: Secondary | ICD-10-CM | POA: Diagnosis not present

## 2021-07-25 DIAGNOSIS — Z905 Acquired absence of kidney: Secondary | ICD-10-CM | POA: Diagnosis not present

## 2021-07-25 DIAGNOSIS — D49512 Neoplasm of unspecified behavior of left kidney: Secondary | ICD-10-CM

## 2021-07-25 DIAGNOSIS — Z85528 Personal history of other malignant neoplasm of kidney: Secondary | ICD-10-CM | POA: Diagnosis not present

## 2021-07-25 DIAGNOSIS — N2889 Other specified disorders of kidney and ureter: Secondary | ICD-10-CM | POA: Diagnosis not present

## 2021-07-25 DIAGNOSIS — K7689 Other specified diseases of liver: Secondary | ICD-10-CM | POA: Diagnosis not present

## 2021-07-25 IMAGING — MR MR ABDOMEN WO/W CM
17 series · 48 of 48 positions shown · IV contrast (MULTIHANCE)
Comparison: CT on [DATE]

CLINICAL DATA: Follow-up left renal masses. 8 months status post
right nephrectomy for renal cell carcinoma.

EXAM:
MRI ABDOMEN WITHOUT AND WITH CONTRAST
TECHNIQUE: Multiplanar multisequence MR imaging of the abdomen was performed
both before and after the administration of intravenous contrast.
CONTRAST:  17mL MULTIHANCE GADOBENATE DIMEGLUMINE 529 MG/ML IV SOLN

[Series 4: T2 · coronal · 5.0mm · 1.56mm/px · 1 of 36 slices shown (1 of 3)]
[im 1/36]
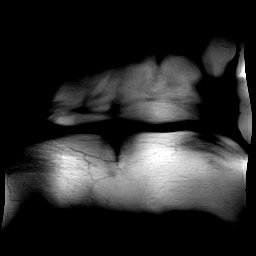

[Series 5: T1 · axial · 3.0mm · 1.12mm/px · z∈[-106,+107]mm · 5 of 144 slices shown]
[im 1/144]
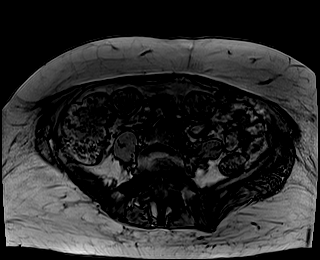
[im 36/144]
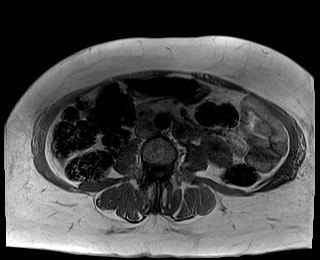
[im 72/144]
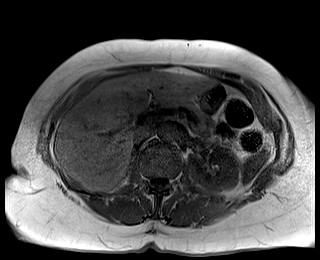
[im 108/144]
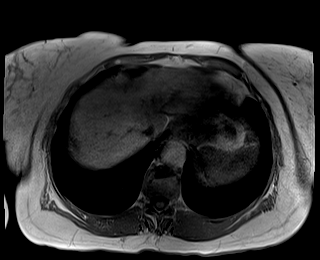
[im 144/144]
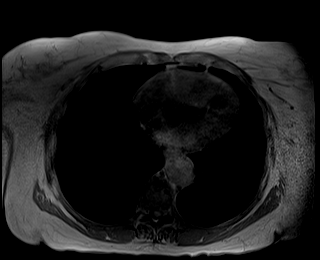

[Series 6: T2 · axial · 6.0mm · 1.22mm/px · 1 of 30 slices shown (2 of 3)]
[im 1/30]
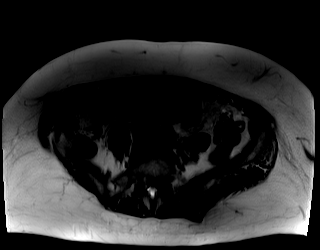

[Series 7: bSSFP · axial · 5.0mm · 1.25mm/px · z∈[-121,+101]mm · 2 of 38 slices shown]
[im 1/38]
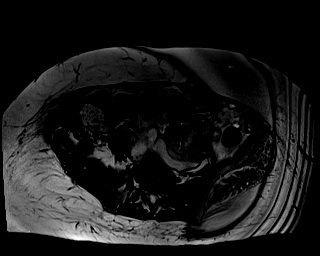
[im 38/38]
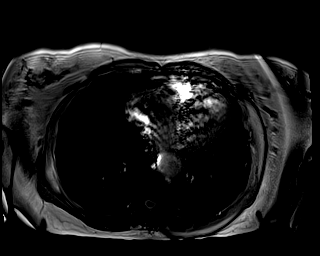

[Series 8: T2 · axial · 5.0mm · 1.48mm/px · z∈[-70,+151]mm · 2 of 38 slices shown (3 of 3)]
[im 1/38]
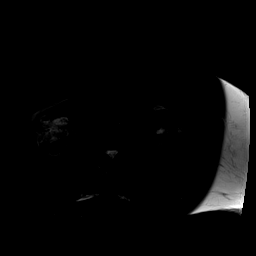
[im 38/38]
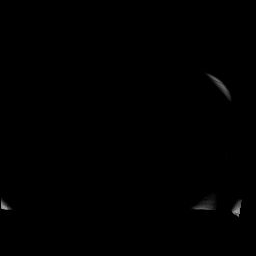

[Series 9: DWI · axial · 5.0mm · 1.42mm/px · z∈[-70,+151]mm · 5 of 114 slices shown (1 of 2)]
[im 1/114]
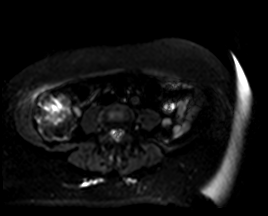
[im 29/114]
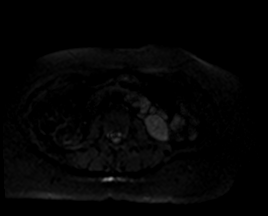
[im 57/114]
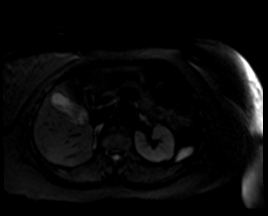
[im 85/114]
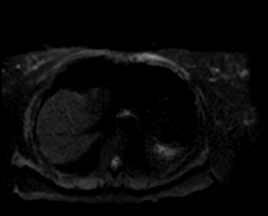
[im 114/114]
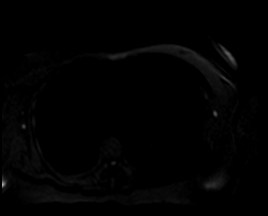

[Series 10: DWI · axial · 5.0mm · 1.42mm/px · z∈[-70,+151]mm · 2 of 38 slices shown (2 of 2)]
[im 1/38]
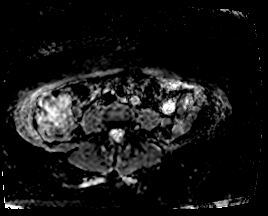
[im 38/38]
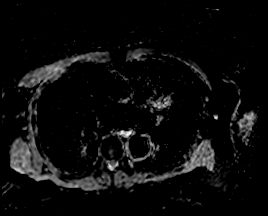

[Series 12: T1 dynamic · axial · non-contrast · 3.0mm · 1.25mm/px · z∈[-92,+120]mm · 3 of 72 slices shown]
[im 1/72]
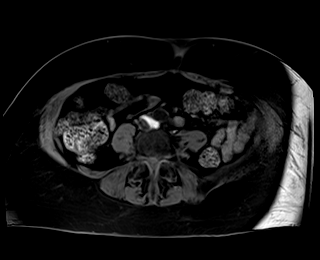
[im 36/72]
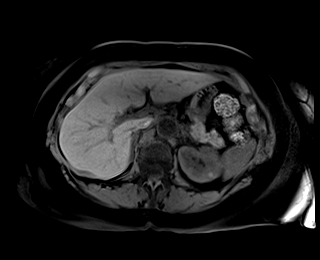
[im 72/72]
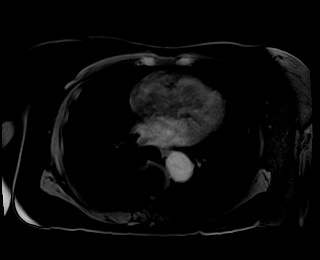

[Series 13: T1 dynamic post-contrast · axial · 3.0mm · 1.25mm/px · z∈[-92,+120]mm · 3 of 72 slices shown (1 of 9)]
[im 1/72]
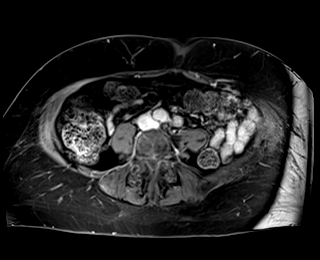
[im 36/72]
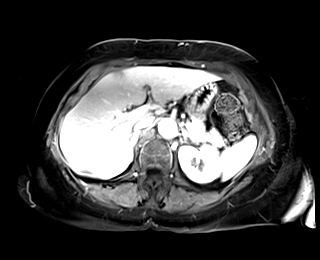
[im 72/72]
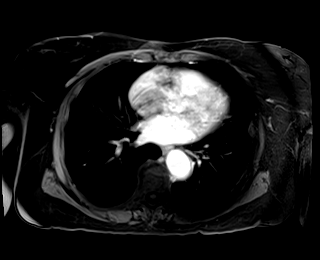

[Series 14: T1 dynamic post-contrast · axial · 3.0mm · 1.25mm/px · z∈[-92,+120]mm · 3 of 72 slices shown (2 of 9)]
[im 1/72]
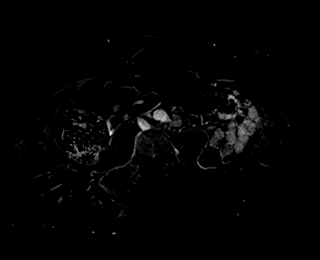
[im 36/72]
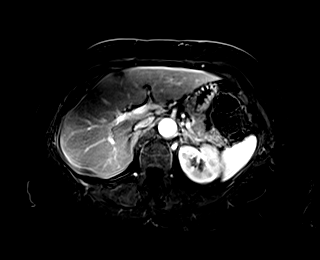
[im 72/72]
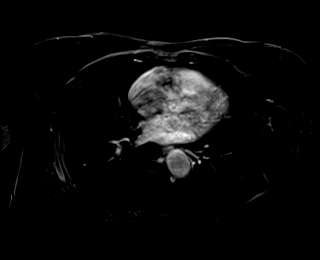

[Series 15: T1 dynamic post-contrast · axial · 3.0mm · 1.25mm/px · z∈[-92,+120]mm · 3 of 72 slices shown (3 of 9)]
[im 1/72]
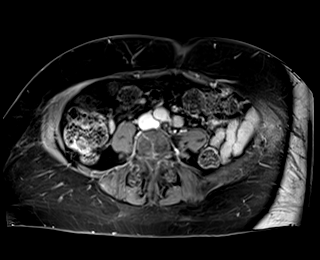
[im 36/72]
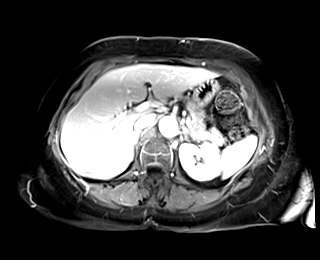
[im 72/72]
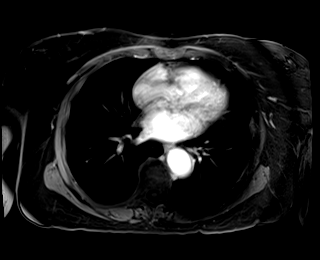

[Series 16: T1 dynamic post-contrast · axial · 3.0mm · 1.25mm/px · z∈[-92,+120]mm · 3 of 72 slices shown (4 of 9)]
[im 1/72]
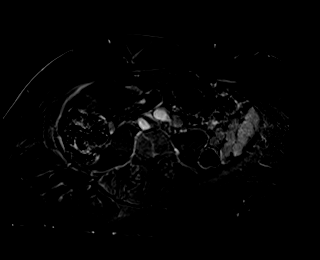
[im 36/72]
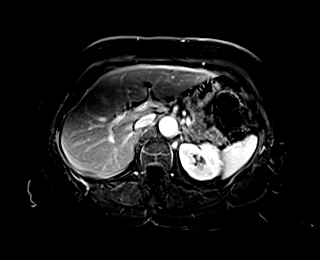
[im 72/72]
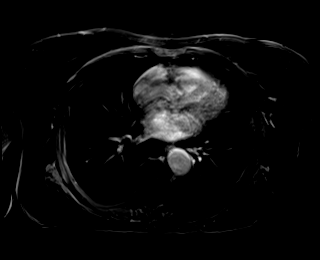

[Series 17: T1 dynamic post-contrast · axial · 3.0mm · 1.25mm/px · z∈[-92,+120]mm · 3 of 72 slices shown (5 of 9)]
[im 1/72]
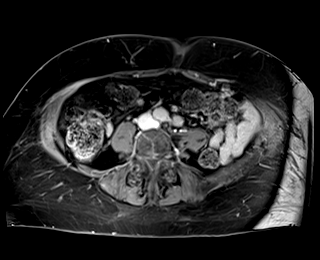
[im 36/72]
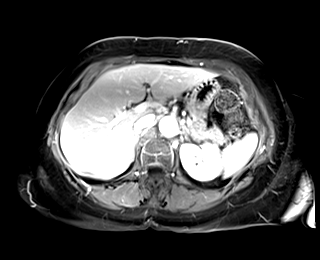
[im 72/72]
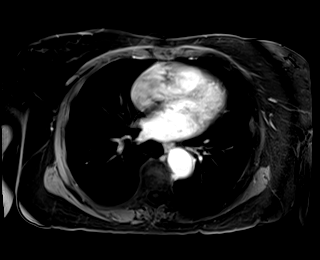

[Series 18: T1 dynamic post-contrast · axial · 3.0mm · 1.25mm/px · z∈[-92,+120]mm · 3 of 72 slices shown (6 of 9)]
[im 1/72]
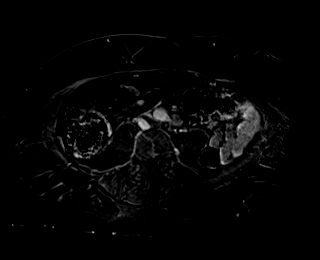
[im 36/72]
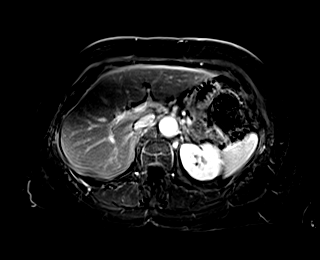
[im 72/72]
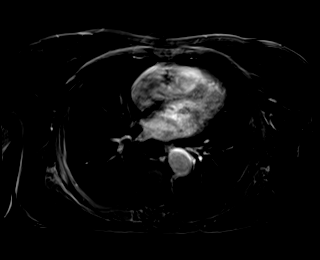

[Series 19: T1 dynamic post-contrast · coronal · 3.0mm · 1.25mm/px · 3 of 72 slices shown (7 of 9)]
[im 1/72]
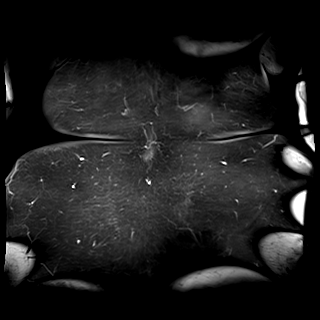
[im 36/72]
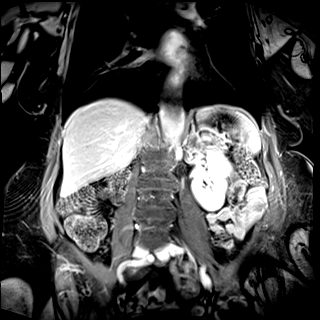
[im 72/72]
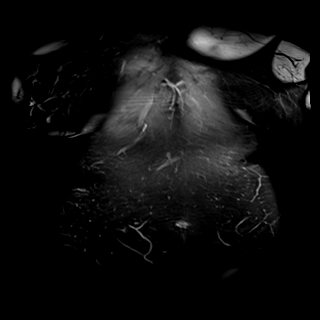

[Series 20: T1 dynamic post-contrast · axial · 3.0mm · 1.25mm/px · z∈[-92,+120]mm · 3 of 72 slices shown (8 of 9)]
[im 1/72]
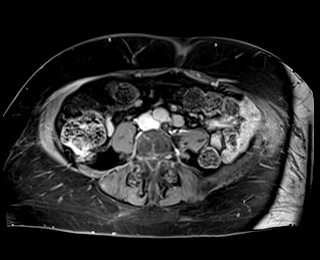
[im 36/72]
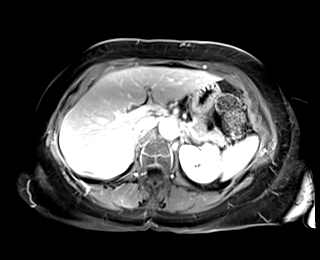
[im 72/72]
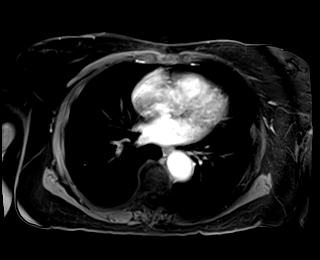

[Series 21: T1 dynamic post-contrast · axial · 3.0mm · 1.25mm/px · z∈[-92,+120]mm · 3 of 72 slices shown (9 of 9)]
[im 1/72]
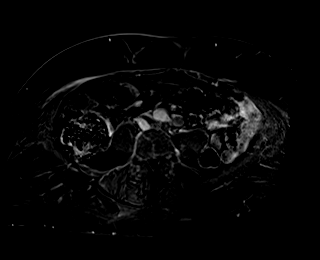
[im 36/72]
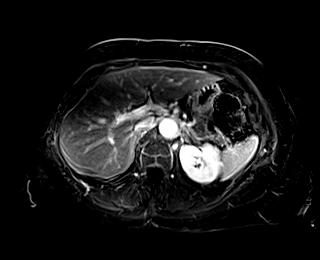
[im 72/72]
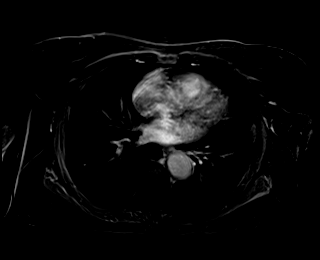

[48 of 48 positions shown; findings below may reference images not displayed]

FINDINGS: Lower chest: No acute findings.

Hepatobiliary: No hepatic masses identified. Tiny sub-cm cyst noted
in the posterior right hepatic lobe. Gallbladder is unremarkable. No
evidence of biliary ductal dilatation.

Pancreas:  No mass or inflammatory changes.

Spleen:  Within normal limits in size and appearance.

Adrenals/Urinary Tract: Patient has undergone right nephrectomy. No
abnormal soft tissue mass seen in the nephrectomy bed. Two tiny
masses are again seen in the midpole of the left kidney, each
measuring 10 mm, which show T2 hypointensity and contrast
enhancement on subtraction imaging. These remain stable and are
suspicious for low-grade papillary renal cell carcinomas. No
evidence of hydronephrosis.

Stomach/Bowel: Visualized portion unremarkable.

Vascular/Lymphatic: No pathologically enlarged lymph nodes
identified. No acute vascular findings.

Other:  None.

Musculoskeletal:  No suspicious bone lesions identified.
IMPRESSION: Status post right nephrectomy. No evidence of abdominal metastatic
disease.

Two 10 mm enhancing masses in the midpole of the left kidney remain
stable, highly suspicious for low-grade papillary renal cell
carcinomas.

## 2021-07-25 MED ORDER — GADOBENATE DIMEGLUMINE 529 MG/ML IV SOLN
17.0000 mL | Freq: Once | INTRAVENOUS | Status: AC | PRN
  Administered 2021-07-25: 17 mL via INTRAVENOUS

## 2021-07-28 ENCOUNTER — Encounter: Payer: Self-pay | Admitting: Endocrinology

## 2021-07-28 ENCOUNTER — Other Ambulatory Visit: Payer: Self-pay

## 2021-07-28 ENCOUNTER — Ambulatory Visit (INDEPENDENT_AMBULATORY_CARE_PROVIDER_SITE_OTHER): Payer: Medicare Other | Admitting: Endocrinology

## 2021-07-28 VITALS — BP 138/78 | HR 75 | Ht 60.5 in | Wt 187.0 lb

## 2021-07-28 DIAGNOSIS — E0590 Thyrotoxicosis, unspecified without thyrotoxic crisis or storm: Secondary | ICD-10-CM

## 2021-07-28 LAB — T4, FREE: Free T4: 0.94 ng/dL (ref 0.60–1.60)

## 2021-07-28 LAB — TSH: TSH: <0.01 u[IU]/mL — ABNORMAL LOW (ref 0.35–5.50)

## 2021-07-28 LAB — T3, FREE: T3, Free: 3.8 pg/mL (ref 2.3–4.2)

## 2021-07-28 NOTE — Progress Notes (Signed)
Patient ID: Christina [name], female   DOB: [birth year], 70 y.o.   MRN: [id number]                                                                                                               Reason for Appointment:  Hyperthyroidism, follow-up visit   Chief complaint: Follow-up of thyroid   History of Present Illness:   Prior history: For the last several years the patient has had symptoms of occasional palpitations, shortness of breath on exertion, shakiness and feeling somewhat warm.  However she did not have any recent worsening of the symptoms except on the morning of hospitalization She previously did not have any unusual weight loss, anxiety or fatigue She was also having some swelling of her legs prior to admission Since she was having difficulties with tachycardia and atrial fibrillation her thyroid level was checked in the hospital and indicated hyperthyroidism  RECENT HISTORY:  Baseline symptoms before treatment were occasional palpitations, shortness of breath on exertion, shakiness and feeling warm She has Graves' disease confirmed with a baseline thyrotropin receptor antibody level of 3.9  She has been treated with methimazole since 05/2019  Her dose has fluctuated between 5 and 10 mg daily  She ambulated periodic adjustments of her dose   She is now taking 7.5 mg in the morning and 5 mg in the evening since 8/22 when her thyroid levels were considerably higher No change was made in 9/22  Recently she feels fairly good, has symptoms of shakiness, no palpitations or heat intolerance   She has been regular with her methimazole  Thyroid levels pending  Last thyrotropin receptor antibody in 8/22 was higher    Wt Readings from Last 3 Encounters:  07/28/21 187 lb (84.8 kg)  06/12/21 186 lb 9.6 oz (84.6 kg)  05/01/21 187 lb 3.2 oz (84.9 kg)   Thyroid function tests as follows:     Lab Results  Component Value Date   FREET4 0.85 06/12/2021   FREET4 2.13 (H)  05/01/2021   FREET4 1.24 02/14/2021   T3FREE 3.9 06/12/2021   T3FREE 6.1 (H) 05/01/2021   T3FREE 4.4 (H) 02/14/2021   TSH <0.01 (L) 06/12/2021   TSH <0.01 (L) 05/01/2021   TSH <0.01 (L) 02/14/2021    Lab Results  Component Value Date   THYROTRECAB 26.80 (H) 05/01/2021   THYROTRECAB 11.20 (H) 09/26/2020   THYROTRECAB 9.46 (H) 07/15/2020   OPHTHALMOPATHY:  Since about 11/20 she was having more swelling of her eyes, some double vision, and a pressure sensation Subsequently in December she had more fuzzy vision, feeling of fullness in her eyes and continued double vision She went to the urgent care center and was given a Medrol Dosepak  With the recurrence of the symptoms in 1/21 she was again started on tapering dose of prednisone Has been treated by oculoplastic ophthalmologist and she had been on Medrol high-dose weekly injections started in late February with only mild and short lasting relief  She had a complete the course of [place]  infusions  This improved her vision and has reduced the swelling and fullness in her eyes  Recently she is complaining about her eyes looking glassy as well as feeling of the eyes bulging more  She says she is concerned about inflammation in her eyes and is taking ibuprofen around She does not want any steroids because she thinks this will make her optic nerve atrophy  She uses refresh drops for dryness  CT scan of orbits has not shown any proptosis and only thickening of the eye muscles which has improved    Allergies as of 07/28/2021       Reactions   Hydrochlorothiazide Other (See Comments), Hypertension   Wildly fluctuates B/P low-to-high, then high-to-low, in a matter of minutes   Hydrocodone-acetaminophen Nausea And Vomiting   Patient DOES NOT wish to take EVER again...   Other Nausea And Vomiting, Other (See Comments)   Stronger pain meds (post-op) caused dehydration and constipation        Medication List        Accurate  as of July 28, 2021  2:43 PM. If you have any questions, ask your nurse or doctor.          acetaminophen 650 MG CR tablet Commonly known as: TYLENOL Take 1,300 mg by mouth in the morning. Take 2 tablets (1300 mg) by mouth scheduled every morning & may take an additional dose (1300 mg) in the evening/afternoon if needed for pain.   adapalene 0.1 % gel Commonly known as: DIFFERIN Apply 1 application topically at bedtime.   amlodipine-benazepril 2.5-10 MG capsule Commonly known as: LOTREL Take 1 capsule by mouth daily.   carboxymethylcellulose 0.5 % Soln Commonly known as: REFRESH PLUS Place 1-2 drops into both eyes 3 (three) times daily as needed (dry/irritated eyes.).   Dapsone 5 % topical gel Apply 1 application topically in the morning. Apply to the face daily as directed   docusate sodium 100 MG capsule Commonly known as: COLACE Take 1 capsule (100 mg total) by mouth 2 (two) times daily.   furosemide 20 MG tablet Commonly known as: LASIX Take 1 tablet (20 mg total) by mouth daily.   IBUPROFEN PO Take by mouth.   Lidocaine-Menthol Roll-On 4-1 % Liqd Generic drug: Lidocaine-Menthol Apply 1 application topically 3 (three) times daily as needed (leg pain).   loratadine 10 MG tablet Commonly known as: CLARITIN Take 10 mg by mouth in the morning.   methimazole 5 MG tablet Commonly known as: TAPAZOLE Take 1.5 tablets (7.5 mg total) by mouth daily. and 1 tablet in the evening   promethazine 12.5 MG tablet Commonly known as: PHENERGAN Take 1 tablet (12.5 mg total) by mouth every 4 (four) hours as needed for nausea or vomiting.   sulfamethoxazole-trimethoprim 400-80 MG tablet Commonly known as: BACTRIM Take 1 tablet by mouth daily.   traMADol 50 MG tablet Commonly known as: Ultram Take 1-2 tablets (50-100 mg total) by mouth every 6 (six) hours as needed for moderate pain or severe pain.            Past Medical History:  Diagnosis Date   Acquired dilation  of ascending aorta and aortic root (HCC)    4cm upper normal on Chest CT 09/2020   Arthritis    PAIN AND OA RIGHT KNEE; S/P LEFT TOTAL KNEE REPLACEMENT 05-07-14 - STILL IN PHYSICAL THERAPY   Asthma    hx of - PT STATES MILD - NO LONGER REQUIRES INHALERS   Cancer ([place])  renal cell carcinoma   CHF (congestive heart failure) (HCC)    Colitis    mild    GERD (gastroesophageal reflux disease)    H/O hiatal hernia    Hypertension    hx of 10 years ago ; PT'S BLOOD PRESSURE RECENTLY ELEVATED WHILE IN HOSP FOR SURG- NOT ON ANY B/P MEDS   Hyperthyroidism    PAF (paroxysmal atrial fibrillation) ([place])     Past Surgical History:  Procedure Laterality Date   KNEE CLOSED REDUCTION Left 06/05/2014   Procedure: CLOSED MANIPULATION KNEE;  Surgeon: Mauri Pole, MD;  [facility];  Service: Orthopedics;  Laterality: Left;   RIGHT/LEFT HEART CATH AND CORONARY ANGIOGRAPHY N/A 05/31/2019   Procedure: RIGHT/LEFT HEART CATH AND CORONARY ANGIOGRAPHY;  Surgeon: Martinique, Peter M, MD;  Location: [facility];  Service: Cardiovascular;  Laterality: N/A;   ROBOTIC ASSITED PARTIAL NEPHRECTOMY Right 03/07/2021   Procedure: XI ROBOTIC ASSITED RADICAL NEPHRECTOMY;  Surgeon: Ceasar Mons, MD;  [facility];  Service: Urology;  Laterality: Right;   TOTAL KNEE ARTHROPLASTY Left 05/07/2014   Procedure: LEFT TOTAL KNEE ARTHROPLASTY;  Surgeon: Mauri Pole, MD;  [facility];  Service: Orthopedics;  Laterality: Left;   TOTAL KNEE ARTHROPLASTY Right 06/05/2014   Procedure: RIGHT TOTAL KNEE ARTHROPLASTY;  Surgeon: Mauri Pole, MD;  [facility];  Service: Orthopedics;  Laterality: Right;   WISDOM TEETH EXTRACTIONS      Family History  Problem Relation Age of Onset   Hypothyroidism Mother    Berenice Primas' disease Sister    Diabetes Brother    Diabetes Maternal Aunt     Social History:  reports that she has never smoked. She has never used smokeless tobacco. She reports that she does not  drink alcohol and does not use drugs.  Allergies:  Allergies  Allergen Reactions   Hydrochlorothiazide Other (See Comments) and Hypertension    Wildly fluctuates B/P low-to-high, then high-to-low, in a matter of minutes   Hydrocodone-Acetaminophen Nausea And Vomiting    Patient DOES NOT wish to take EVER again...   Other Nausea And Vomiting and Other (See Comments)    Stronger pain meds (post-op) caused dehydration and constipation     Review of Systems  Essential hypertension: Treated by cardiologist with Lotrel   BP Readings from Last 3 Encounters:  07/28/21 138/78  06/12/21 124/80  05/01/21 118/88       Examination:   BP 138/78   Pulse 75   Ht 5' 0.5" (1.537 m)   Wt 187 lb (84.8 kg)   SpO2 98%   BMI 35.92 kg/m   She has puffiness of the left > Rt eye but no proptosis Biceps reflexes show normal relaxation     Assessment/Plan:   Hyperthyroidism, from Graves' disease with baseline thyrotropin receptor antibody of 3.9  She has been on methimazole since early September 2020   She has had frequent adjustments of her methimazole dose although usually between 5 to 10 mg a day  Currently taking 5 mg in the morning and 7.5 in the evening  Last thyrotropin receptor antibody was increasing  She has subjectively felt well and her exam is unremarkable And labs to be checked today  She will hold off on thyroidectomy until after she is resolved her kidney cancer issues Currently appears to have stable lesions on the left kidney   Elayne Snare 07/28/2021, 2:43 PM    Note: This office note was prepared with Dragon voice recognition system technology.  Any transcriptional errors that result from this process are unintentional.  Addendum: T4 and T3 levels are about the same, she will stay on 12.5 mg total per day and follow-up in 2 months as scheduled

## 2021-07-30 ENCOUNTER — Other Ambulatory Visit: Payer: Self-pay | Admitting: Endocrinology

## 2021-07-30 DIAGNOSIS — E0590 Thyrotoxicosis, unspecified without thyrotoxic crisis or storm: Secondary | ICD-10-CM

## 2021-07-31 ENCOUNTER — Other Ambulatory Visit: Payer: Self-pay

## 2021-07-31 DIAGNOSIS — E0590 Thyrotoxicosis, unspecified without thyrotoxic crisis or storm: Secondary | ICD-10-CM

## 2021-07-31 DIAGNOSIS — C641 Malignant neoplasm of right kidney, except renal pelvis: Secondary | ICD-10-CM | POA: Diagnosis not present

## 2021-07-31 DIAGNOSIS — D49512 Neoplasm of unspecified behavior of left kidney: Secondary | ICD-10-CM | POA: Diagnosis not present

## 2021-08-06 DIAGNOSIS — H2513 Age-related nuclear cataract, bilateral: Secondary | ICD-10-CM | POA: Diagnosis not present

## 2021-08-06 DIAGNOSIS — E0500 Thyrotoxicosis with diffuse goiter without thyrotoxic crisis or storm: Secondary | ICD-10-CM | POA: Diagnosis not present

## 2021-08-06 DIAGNOSIS — H40011 Open angle with borderline findings, low risk, right eye: Secondary | ICD-10-CM | POA: Diagnosis not present

## 2021-08-11 ENCOUNTER — Other Ambulatory Visit: Payer: Self-pay | Admitting: Urology

## 2021-08-11 DIAGNOSIS — N2889 Other specified disorders of kidney and ureter: Secondary | ICD-10-CM

## 2021-08-28 ENCOUNTER — Ambulatory Visit
Admission: RE | Admit: 2021-08-28 | Discharge: 2021-08-28 | Disposition: A | Discharge status: Home / Self Care | Payer: Medicare Other | Source: Ambulatory Visit | Attending: Urology | Admitting: Urology

## 2021-08-28 ENCOUNTER — Encounter: Payer: Self-pay | Admitting: *Deleted

## 2021-08-28 DIAGNOSIS — N2889 Other specified disorders of kidney and ureter: Secondary | ICD-10-CM

## 2021-08-28 HISTORY — PX: IR RADIOLOGIST EVAL & MGMT: IMG5224

## 2021-08-28 NOTE — Consult Note (Signed)
Chief Complaint: Patient was seen in consultation today for left renal lesions and possible ablation.  History of Present Illness: Christina [name] is a 70 y.o. female status post right nephrectomy on 03/07/2021 by Dr. 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   At the time of detection of the right renal carcinoma, 2 small left renal lesions were also detected initially by CT emanating from the mid to lower pole with a partially exophytic lateral cortical lesion demonstrating partially cystic characteristics by unenhanced imaging and demonstrating probable partial internal enhancement with maximal diameter of approximately 1.2 cm by CT and an additional posterior cortical lesion which is endophytic and partially cystic demonstrating probable internal enhancement and measuring approximately 1.3 cm in greatest diameter by CT.  Both of these lesions were subsequently followed by MRI of the abdomen on 07/25/2021 and demonstrated no growth with more accurate diameter estimate of approximately 10 mm each and demonstrating partial internal enhancement.  Christina [name] recovered very quickly after right nephrectomy and is currently asymptomatic.  She denies any urinary symptoms.  Past Medical History:  Diagnosis Date   Acquired dilation of ascending aorta and aortic root ([place])    4cm upper normal on Chest CT 09/2020   Arthritis    PAIN AND OA RIGHT KNEE; S/P LEFT TOTAL KNEE REPLACEMENT 05-07-14 - STILL IN PHYSICAL THERAPY   Asthma    hx of - PT STATES MILD - NO LONGER REQUIRES INHALERS   Cancer ([place])    renal cell carcinoma   CHF (congestive heart failure) (H[facility])    Colitis    mild    GERD (gastroesophageal reflux disease)    H/O hiatal hernia    Hypertension    hx of 10 years ago ; PT'S BLOOD PRESSURE RECENTLY ELEVATED WHILE IN HOSP FOR SURG- NOT ON ANY B/P  MEDS   Hyperthyroidism    PAF (paroxysmal atrial fibrillation) ([place])     Past Surgical History:  Procedure Laterality Date   KNEE CLOSED REDUCTION Left 06/05/2014   Procedure: CLOSED MANIPULATION KNEE;  Surgeon: Mauri Pole, MD;  [facility]: [facility];  Service: Orthopedics;  Laterality: Left;   RIGHT/LEFT HEART CATH AND CORONARY ANGIOGRAPHY N/A 05/31/2019   Procedure: RIGHT/LEFT HEART CATH AND CORONARY ANGIOGRAPHY;  Surgeon: Martinique, Peter M, MD;  Location: [facility];  Service: Cardiovascular;  Laterality: N/A;   ROBOTIC ASSITED PARTIAL NEPHRECTOMY Right 03/07/2021   Procedure: XI ROBOTIC ASSITED RADICAL NEPHRECTOMY;  Surgeon: Ceasar Mons, MD;  [facility];  Service: Urology;  Laterality: Right;   TOTAL KNEE ARTHROPLASTY Left 05/07/2014   Procedure: LEFT TOTAL KNEE ARTHROPLASTY;  Surgeon: Mauri Pole, MD;  [facility]: [facility];  Service: Orthopedics;  Laterality: Left;   TOTAL KNEE ARTHROPLASTY Right 06/05/2014   Procedure: RIGHT TOTAL KNEE ARTHROPLASTY;  Surgeon: Mauri Pole, MD;  [facility];  Service: Orthopedics;  Laterality: Right;   WISDOM TEETH EXTRACTIONS      Allergies: Hydrochlorothiazide, Hydrocodone-acetaminophen, and Other  Medications: Prior to Admission medications   Medication Sig Start Date End Date Taking? Authorizing Provider  acetaminophen (TYLENOL) 650 MG CR tablet Take 1,300 mg by mouth in the morning. Take 2 tablets (1300 mg) by mouth scheduled every morning & may take an additional dose (1300 mg) in the evening/afternoon if needed for pain.    [provider]  adapalene (DIFFERIN)  0.1 % gel Apply 1 application topically at bedtime.    [provider]  amlodipine-benazepril (LOTREL) 2.5-10 MG capsule Take 1 capsule by mouth daily. 02/19/21   Sueanne Margarita, MD  carboxymethylcellulose (REFRESH PLUS) 0.5 % SOLN Place 1-2 drops into both eyes 3 (three) times daily as needed (dry/irritated eyes.).    [provider]   Dapsone 5 % topical gel Apply 1 application topically in the morning. Apply to the face daily as directed    [provider]  docusate sodium (COLACE) 100 MG capsule Take 1 capsule (100 mg total) by mouth 2 (two) times daily. Patient not taking: Reported on 05/01/2021 03/07/21   Debbrah Alar, PA-C  furosemide (LASIX) 20 MG tablet Take 1 tablet (20 mg total) by mouth daily. 01/31/21   Sueanne Margarita, MD  IBUPROFEN PO Take by mouth.    [provider]  Lidocaine-Menthol (LIDOCAINE-MENTHOL ROLL-ON) 4-1 % LIQD Apply 1 application topically 3 (three) times daily as needed (leg pain).    [provider]  loratadine (CLARITIN) 10 MG tablet Take 10 mg by mouth in the morning.    [provider]  methimazole (TAPAZOLE) 5 MG tablet TAKE 1 & 1/2 (ONE & ONE-HALF) TABLETS BY MOUTH IN THE MORNING AND 1 IN THE EVENING 07/30/21   Elayne Snare, MD  promethazine (PHENERGAN) 12.5 MG tablet Take 1 tablet (12.5 mg total) by mouth every 4 (four) hours as needed for nausea or vomiting. Patient not taking: Reported on 05/01/2021 03/07/21   Debbrah Alar, PA-C  sulfamethoxazole-trimethoprim (BACTRIM) 400-80 MG tablet Take 1 tablet by mouth daily.    [provider]  traMADol (ULTRAM) 50 MG tablet Take 1-2 tablets (50-100 mg total) by mouth every 6 (six) hours as needed for moderate pain or severe pain. Patient not taking: Reported on 05/01/2021 03/07/21   Debbrah Alar, PA-C     Family History  Problem Relation Age of Onset   Hypothyroidism Mother    Berenice Primas' disease Sister    Diabetes Brother    Diabetes Maternal Aunt     Social History   Socioeconomic History   Marital status: Single    Spouse name: Not on file   Number of children: Not on file   Years of education: Not on file   Highest education level: Not on file  Occupational History   Not on file  Tobacco Use   Smoking status: Never   Smokeless tobacco: Never  Vaping Use   Vaping Use: Never used  Substance  and Sexual Activity   Alcohol use: No   Drug use: No   Sexual activity: Not Currently  Other Topics Concern   Not on file  Social History Narrative   Not on file   Social Determinants of Health   Financial Resource Strain: Not on file  Food Insecurity: Not on file  Transportation Needs: Not on file  Physical Activity: Not on file  Stress: Not on file  Social Connections: Not on file    ECOG Status: 0 - Asymptomatic  Review of Systems: A 12 point ROS discussed and pertinent positives are indicated in the HPI above.  All other systems are negative.  Review of Systems  Constitutional: Negative.   Respiratory: Negative.    Cardiovascular: Negative.   Gastrointestinal: Negative.   Genitourinary: Negative.   Musculoskeletal: Negative.   Neurological: Negative.    Vital Signs: BP (!) 124/53 (BP Location: Right Arm)   Pulse 69   SpO2 98%   Physical  Exam Vitals reviewed.  Constitutional:      General: She is not in acute distress.    Appearance: Normal appearance. She is not ill-appearing, toxic-appearing or diaphoretic.  Cardiovascular:     Rate and Rhythm: Normal rate and regular rhythm.     Heart sounds: Normal heart sounds. No murmur heard.   No friction rub. No gallop.  Pulmonary:     Effort: Pulmonary effort is normal. No respiratory distress.     Breath sounds: Normal breath sounds. No stridor. No wheezing, rhonchi or rales.  Abdominal:     General: There is no distension.     Palpations: Abdomen is soft. There is no mass.     Tenderness: There is no abdominal tenderness. There is no right CVA tenderness, left CVA tenderness, guarding or rebound.  Musculoskeletal:        General: No swelling.     Cervical back: Neck supple.  Skin:    General: Skin is warm and dry.  Neurological:     General: No focal deficit present.     Mental Status: She is alert and oriented to person, place, and time.     Imaging: No results found.  Labs:  CBC: Recent Labs     02/24/21 1501 03/07/21 1509 03/08/21 0548 03/10/21 0510 06/12/21 1452  WBC 5.3  --   --   --  5.2  HGB 13.7 13.1 12.6 11.1* 12.5  HCT 41.5 40.8 38.3 33.8* 37.7  PLT 262  --   --   --  293.0    COAGS: Recent Labs    02/24/21 1501  INR 0.9    BMP: Recent Labs    09/26/20 1317 01/31/21 1329 02/24/21 1501 03/08/21 0548 03/10/21 0510  NA 140 142 139 133* 136  K 4.6 4.5 4.2 4.2 3.8  CL 99 103 108 100 103  CO2 $Re'23 23 22 24 26  'qCb$ GLUCOSE 83 92 101* 173* 90  BUN $Re'15 11 18 13 14  'jRb$ CALCIUM 9.7 9.5 9.5 9.2 9.1  CREATININE 0.91 0.74 0.87 1.25* 1.01*  GFRNONAA 65  --  >60 47* >60  GFRAA 76  --   --   --   --    BUN 26, Cr 0.9 and GFR 66 mL/min on 07/25/2021   LIVER FUNCTION TESTS: Recent Labs    06/12/21 1452  ALT 8     Assessment and Plan:  I met with Christina [name] and we reviewed all of the appropriate imaging studies that she has had recently as well as in the past.  Other than the recent imaging, the only other imaging of her abdomen she had was a CT on 04/16/2004 on which none of the left or the right renal mass is clearly evident.  Although somewhat suspicious, I reassured Christina [name] that both small left renal cortical lesions have showed no growth in a 44-month interval between the recent CT and MRI studies and remain very small in size.  I did not recommend pursuing immediate percutaneous ablation of either lesion given Christina size as they may be even difficult to treat accurately at Christina size, especially the lesion that is endophytic in the posterior left kidney.  There also are 2 lesions and given that she now has a solitary kidney, I would not recommend ablating 2 lesions in one setting. However, it is reassuring that her renal function is normal after right nephrectomy.  Christina [name] is concerned that one or both of these lesions could represent left-sided renal carcinomas.  I reassured her that at the size of the small lesions, there is no metastatic potential and no danger  in following the lesions for growth.  I have recommended a follow-up MRI in roughly 6 months.  We may be able to extend the interval for imaging surveillance if we can continue to document stability of the small lesions.  If either of the lesions shows definitive growth, they would be amenable to percutaneous cryoablation.  I did discuss details of potential future cryoablation with Christina [name].  Thank you for this interesting consult.  I greatly enjoyed meeting Christina [name] and look forward to participating in Christina [name].  A copy of this report was sent to the requesting provider on this date.  Electronically Signed: Azzie Roup 08/28/2021, 12:56 PM    I spent a total of 40 Minutes in face to face in clinical consultation, greater than 50% of which was counseling/coordinating [name] for left renal masses.

## 2021-09-29 ENCOUNTER — Encounter: Payer: Self-pay | Admitting: Endocrinology

## 2021-09-29 ENCOUNTER — Ambulatory Visit (INDEPENDENT_AMBULATORY_CARE_PROVIDER_SITE_OTHER): Payer: Medicare Other | Admitting: Endocrinology

## 2021-09-29 ENCOUNTER — Other Ambulatory Visit: Payer: Self-pay

## 2021-09-29 VITALS — BP 142/82 | HR 73 | Ht 61.0 in | Wt 185.0 lb

## 2021-09-29 DIAGNOSIS — E0590 Thyrotoxicosis, unspecified without thyrotoxic crisis or storm: Secondary | ICD-10-CM

## 2021-09-29 DIAGNOSIS — R5383 Other fatigue: Secondary | ICD-10-CM | POA: Diagnosis not present

## 2021-09-29 LAB — CBC
HCT: 39.3 % (ref 36.0–46.0)
Hemoglobin: 12.9 g/dL (ref 12.0–15.0)
MCHC: 32.7 g/dL (ref 30.0–36.0)
MCV: 91 fl (ref 78.0–100.0)
Platelets: 300 10*3/uL (ref 150.0–400.0)
RBC: 4.32 Mil/uL (ref 3.87–5.11)
RDW: 13.1 % (ref 11.5–15.5)
WBC: 5.6 10*3/uL (ref 4.0–10.5)

## 2021-09-29 LAB — T4, FREE: Free T4: 0.85 ng/dL (ref 0.60–1.60)

## 2021-09-29 LAB — ALT: ALT: 10 U/L (ref 0–35)

## 2021-09-29 LAB — TSH: TSH: 0 u[IU]/mL — ABNORMAL LOW (ref 0.35–5.50)

## 2021-09-29 NOTE — Progress Notes (Signed)
Patient ID: Christina [name], female   DOB: [birth year], 71 y.o.   MRN: [id number]                                                                                                               Reason for Appointment:  Hyperthyroidism, follow-up visit   Chief complaint: Follow-up of thyroid   History of Present Illness:   Prior history: For the last several years the patient has had symptoms of occasional palpitations, shortness of breath on exertion, shakiness and feeling somewhat warm.  However she did not have any recent worsening of the symptoms except on the morning of hospitalization She previously did not have any unusual weight loss, anxiety or fatigue She was also having some swelling of her legs prior to admission Since she was having difficulties with tachycardia and atrial fibrillation her thyroid level was checked in the hospital and indicated hyperthyroidism  RECENT HISTORY:  Baseline symptoms before treatment were occasional palpitations, shortness of breath on exertion, shakiness and feeling warm She has Graves' disease confirmed with a baseline thyrotropin receptor antibody level of 3.9  She has been treated with methimazole since 05/2019  Her dose has fluctuated between 5 and 10 mg daily  She ambulated periodic adjustments of her dose   She is taking 7.5 mg in the morning and 5 mg in the evening since 8/22  No change was made on her last visit with stable thyroid levels  Recently she feels more tired and cold No weight change  She has been regular with her methimazole doses as above  Thyroid levels pending  Last thyrotropin receptor antibody in 8/22 was higher    Wt Readings from Last 3 Encounters:  09/29/21 185 lb (83.9 kg)  07/28/21 187 lb (84.8 kg)  06/12/21 186 lb 9.6 oz (84.6 kg)   Thyroid function tests as follows:     Lab Results  Component Value Date   FREET4 0.94 07/28/2021   FREET4 0.85 06/12/2021   FREET4 2.13 (H) 05/01/2021   T3FREE 3.8  07/28/2021   T3FREE 3.9 06/12/2021   T3FREE 6.1 (H) 05/01/2021   TSH <0.01 (L) 07/28/2021   TSH <0.01 (L) 06/12/2021   TSH <0.01 (L) 05/01/2021    Lab Results  Component Value Date   THYROTRECAB 26.80 (H) 05/01/2021   THYROTRECAB 11.20 (H) 09/26/2020   THYROTRECAB 9.46 (H) 07/15/2020   OPHTHALMOPATHY:  Since about 11/20 she was having more swelling of her eyes, some double vision, and a pressure sensation Subsequently in December she had more fuzzy vision, feeling of fullness in her eyes and continued double vision She went to the urgent care center and was given a Medrol Dosepak  With the recurrence of the symptoms in 1/21 she was again started on tapering dose of prednisone Has been treated by oculoplastic ophthalmologist and she had been on Medrol high-dose weekly injections started in late February with only mild and short lasting relief  She had a complete the course of Tepezza  infusions  This improved her  vision and has reduced the swelling and fullness in her eyes  Recently she is complaining about her eyes looking glassy as well as feeling of the eyes bulging more  She says she is concerned about inflammation in her eyes and is taking ibuprofen around She does not want any steroids because she thinks this will make her optic nerve atrophy  She uses refresh drops for dryness  CT scan of orbits has not shown any proptosis and only thickening of the eye muscles which has improved    Allergies as of 09/29/2021       Reactions   Hydrochlorothiazide Other (See Comments), Hypertension   Wildly fluctuates B/P low-to-high, then high-to-low, in a matter of minutes   Hydrocodone-acetaminophen Nausea And Vomiting   Patient DOES NOT wish to take EVER again...   Other Nausea And Vomiting, Other (See Comments)   Stronger pain meds (post-op) caused dehydration and constipation        Medication List        Accurate as of September 29, 2021  2:51 PM. If you have any  questions, ask your nurse or doctor.          acetaminophen 650 MG CR tablet Commonly known as: TYLENOL Take 1,300 mg by mouth in the morning. Take 2 tablets (1300 mg) by mouth scheduled every morning & may take an additional dose (1300 mg) in the evening/afternoon if needed for pain.   adapalene 0.1 % gel Commonly known as: DIFFERIN Apply 1 application topically at bedtime.   amlodipine-benazepril 2.5-10 MG capsule Commonly known as: LOTREL Take 1 capsule by mouth daily.   amoxicillin 500 MG capsule Commonly known as: AMOXIL SMARTSIG:4 Capsule(s) By Mouth Once   carboxymethylcellulose 0.5 % Soln Commonly known as: REFRESH PLUS Place 1-2 drops into both eyes 3 (three) times daily as needed (dry/irritated eyes.).   Dapsone 5 % topical gel Apply 1 application topically in the morning. Apply to the face daily as directed   docusate sodium 100 MG capsule Commonly known as: COLACE Take 1 capsule (100 mg total) by mouth 2 (two) times daily.   furosemide 20 MG tablet Commonly known as: LASIX Take 1 tablet (20 mg total) by mouth daily.   IBUPROFEN PO Take by mouth.   Lidocaine-Menthol Roll-On 4-1 % Liqd Generic drug: Lidocaine-Menthol Apply 1 application topically 3 (three) times daily as needed (leg pain).   loratadine 10 MG tablet Commonly known as: CLARITIN Take 10 mg by mouth in the morning.   methimazole 5 MG tablet Commonly known as: TAPAZOLE TAKE 1 & 1/2 (ONE & ONE-HALF) TABLETS BY MOUTH IN THE MORNING AND 1 IN THE EVENING   promethazine 12.5 MG tablet Commonly known as: PHENERGAN Take 1 tablet (12.5 mg total) by mouth every 4 (four) hours as needed for nausea or vomiting.   sulfamethoxazole-trimethoprim 400-80 MG tablet Commonly known as: BACTRIM Take 1 tablet by mouth daily.   traMADol 50 MG tablet Commonly known as: Ultram Take 1-2 tablets (50-100 mg total) by mouth every 6 (six) hours as needed for moderate pain or severe pain.            Past  Medical History:  Diagnosis Date   Acquired dilation of ascending aorta and aortic root ([facility])    4cm upper normal on Chest CT 09/2020   Arthritis    PAIN AND OA RIGHT KNEE; S/P LEFT TOTAL KNEE REPLACEMENT 05-07-14 - STILL IN PHYSICAL THERAPY   Asthma    hx of - PT  STATES MILD - NO LONGER REQUIRES INHALERS   Cancer ([place])    renal cell carcinoma   CHF (congestive heart failure) ([facility])    Colitis    mild    GERD (gastroesophageal reflux disease)    H/O hiatal hernia    Hypertension    hx of 10 years ago ; PT'S BLOOD PRESSURE RECENTLY ELEVATED WHILE IN HOSP FOR SURG- NOT ON ANY B/P MEDS   Hyperthyroidism    PAF (paroxysmal atrial fibrillation) ([place])     Past Surgical History:  Procedure Laterality Date   IR RADIOLOGIST EVAL & MGMT  08/28/2021   KNEE CLOSED REDUCTION Left 06/05/2014   Procedure: CLOSED MANIPULATION KNEE;  Surgeon: Mauri Pole, MD;  [facility];  Service: Orthopedics;  Laterality: Left;   RIGHT/LEFT HEART CATH AND CORONARY ANGIOGRAPHY N/A 05/31/2019   Procedure: RIGHT/LEFT HEART CATH AND CORONARY ANGIOGRAPHY;  Surgeon: Martinique, Peter M, MD;  Location: [facility];  Service: Cardiovascular;  Laterality: N/A;   ROBOTIC ASSITED PARTIAL NEPHRECTOMY Right 03/07/2021   Procedure: XI ROBOTIC ASSITED RADICAL NEPHRECTOMY;  Surgeon: Ceasar Mons, MD;  [facility];  Service: Urology;  Laterality: Right;   TOTAL KNEE ARTHROPLASTY Left 05/07/2014   Procedure: LEFT TOTAL KNEE ARTHROPLASTY;  Surgeon: Mauri Pole, MD;  [facility];  Service: Orthopedics;  Laterality: Left;   TOTAL KNEE ARTHROPLASTY Right 06/05/2014   Procedure: RIGHT TOTAL KNEE ARTHROPLASTY;  Surgeon: Mauri Pole, MD;  [facility];  Service: Orthopedics;  Laterality: Right;   WISDOM TEETH EXTRACTIONS      Family History  Problem Relation Age of Onset   Hypothyroidism Mother    Berenice Primas' disease Sister    Diabetes Brother    Diabetes Maternal Aunt     Social History:  reports  that she has never smoked. She has never used smokeless tobacco. She reports that she does not drink alcohol and does not use drugs.  Allergies:  Allergies  Allergen Reactions   Hydrochlorothiazide Other (See Comments) and Hypertension    Wildly fluctuates B/P low-to-high, then high-to-low, in a matter of minutes   Hydrocodone-Acetaminophen Nausea And Vomiting    Patient DOES NOT wish to take EVER again...   Other Nausea And Vomiting and Other (See Comments)    Stronger pain meds (post-op) caused dehydration and constipation     Review of Systems  Essential hypertension: Treated by cardiologist with Lotrel   BP Readings from Last 3 Encounters:  09/29/21 (!) 142/82  08/28/21 (!) 124/53  07/28/21 138/78       Examination:   BP (!) 142/82    Pulse 73    Ht 5\' 1"  (1.549 m)    Wt 185 lb (83.9 kg)    SpO2 99%    BMI 34.96 kg/m   Thyroid nonpalpable  Biceps reflexes show normal relaxation     Assessment/Plan:   Hyperthyroidism, from Graves' disease with baseline thyrotropin receptor antibody of 3.9  She has been on methimazole since early September 2020   She has had frequent adjustments of her methimazole dose although not requiring relatively higher doses than before Currently taking 5 mg in the morning and 7.5 in the evening  She has complained of fatigue and cold intolerance although this may be a nonspecific No recent weight gain Objectively does not look hypothyroid Note thyroid enlargement  Thyroid levels to be checked today  Although likely she needs to be treated surgically because of her prolonged hyperthyroidism and lacking any signs  of remission she is very reluctant to do this despite explaining the benefits  Last thyrotropin receptor antibody was increasing as of 8/22 and will be rechecked  Labs to be checked today for thyroid functions and doses and follow-up decided accordingly  Elayne Snare 09/29/2021, 2:51 PM    Note: This office note was prepared  with Dragon voice recognition system technology. Any transcriptional errors that result from this process are unintentional.  Addendum: T4 and T3 levels are about the same, she will stay on 12.5 mg total per day and follow-up in 2 months as scheduled

## 2021-09-30 LAB — THYROTROPIN RECEPTOR AUTOABS: Thyrotropin Receptor Ab: 149 IU/L — ABNORMAL HIGH (ref 0.00–1.75)

## 2021-10-17 ENCOUNTER — Encounter: Payer: Self-pay | Admitting: Endocrinology

## 2021-10-17 ENCOUNTER — Other Ambulatory Visit: Payer: Self-pay | Admitting: Endocrinology

## 2021-10-17 DIAGNOSIS — E0590 Thyrotoxicosis, unspecified without thyrotoxic crisis or storm: Secondary | ICD-10-CM

## 2021-10-20 NOTE — Telephone Encounter (Signed)
Patient called and states the quality is incorrect on current prescription. Patient is taking 2 1/2 tablets a day which equal to 225 tablets for 90 days supply. Patient request a new prescription be sent to pharmacy.  [place], [place] - [place] Phone:  [phone]  Fax:  [phone]

## 2021-10-21 DIAGNOSIS — I10 Essential (primary) hypertension: Secondary | ICD-10-CM | POA: Diagnosis not present

## 2021-10-21 DIAGNOSIS — E782 Mixed hyperlipidemia: Secondary | ICD-10-CM | POA: Diagnosis not present

## 2021-10-21 MED ORDER — METHIMAZOLE 5 MG PO TABS: ORAL_TABLET | ORAL | 0 refills | Status: DC

## 2021-10-23 DIAGNOSIS — H02531 Eyelid retraction right upper eyelid: Secondary | ICD-10-CM | POA: Diagnosis not present

## 2021-10-23 DIAGNOSIS — H02534 Eyelid retraction left upper eyelid: Secondary | ICD-10-CM | POA: Diagnosis not present

## 2021-10-23 DIAGNOSIS — H40053 Ocular hypertension, bilateral: Secondary | ICD-10-CM | POA: Diagnosis not present

## 2021-10-23 DIAGNOSIS — H04123 Dry eye syndrome of bilateral lacrimal glands: Secondary | ICD-10-CM | POA: Diagnosis not present

## 2021-10-23 DIAGNOSIS — H532 Diplopia: Secondary | ICD-10-CM | POA: Diagnosis not present

## 2021-10-23 DIAGNOSIS — E0500 Thyrotoxicosis with diffuse goiter without thyrotoxic crisis or storm: Secondary | ICD-10-CM | POA: Diagnosis not present

## 2021-10-23 DIAGNOSIS — H11423 Conjunctival edema, bilateral: Secondary | ICD-10-CM | POA: Diagnosis not present

## 2021-10-24 ENCOUNTER — Other Ambulatory Visit: Payer: Self-pay | Admitting: Ophthalmology

## 2021-10-24 DIAGNOSIS — E0500 Thyrotoxicosis with diffuse goiter without thyrotoxic crisis or storm: Secondary | ICD-10-CM

## 2021-11-11 ENCOUNTER — Other Ambulatory Visit: Payer: Medicare Other

## 2021-11-11 ENCOUNTER — Ambulatory Visit
Admission: RE | Admit: 2021-11-11 | Discharge: 2021-11-11 | Disposition: A | Discharge status: Home / Self Care | Payer: Medicare Other | Source: Ambulatory Visit | Attending: Ophthalmology | Admitting: Ophthalmology

## 2021-11-11 DIAGNOSIS — E0500 Thyrotoxicosis with diffuse goiter without thyrotoxic crisis or storm: Secondary | ICD-10-CM

## 2021-11-11 DIAGNOSIS — R220 Localized swelling, mass and lump, head: Secondary | ICD-10-CM | POA: Diagnosis not present

## 2021-11-11 DIAGNOSIS — M6289 Other specified disorders of muscle: Secondary | ICD-10-CM | POA: Diagnosis not present

## 2021-11-11 DIAGNOSIS — H0520 Unspecified exophthalmos: Secondary | ICD-10-CM | POA: Diagnosis not present

## 2021-11-11 IMAGING — CT CT ORBITS W/O CM
1 series · 15 of 30 positions shown, 19 images · non-contrast
Comparison: CT orbits [DATE], CT orbits [DATE]

CLINICAL DATA: History of Graves disease, proptosis, thyrotoxicosis
with bulging and swelling advise

EXAM:
CT ORBITS WITHOUT CONTRAST
TECHNIQUE: Multidetector CT imaging of the orbits was performed using the
standard protocol without intravenous contrast. Multiplanar CT image
reconstructions were also generated.
RADIATION DOSE REDUCTION: This exam was performed according to the
departmental dose-optimization program which includes automated
exposure control, adjustment of the mA and/or kV according to
patient size and/or use of iterative reconstruction technique.

[Series 4: orbits-soft · axial · 0.33mm/px · z∈[+1155,+1259]mm · 15 of 56 slices shown, 19 images]
[im 2/56  brain]
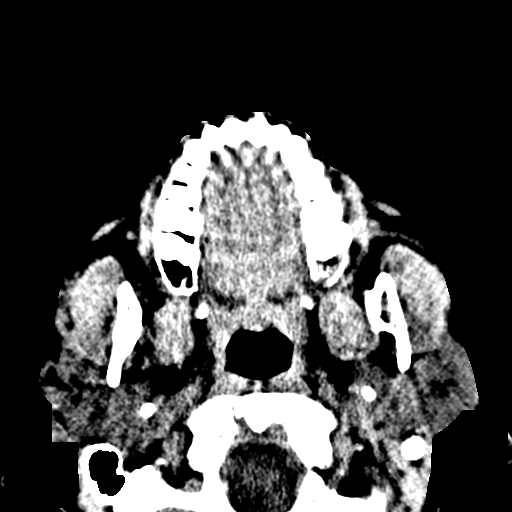
[im 2/56  bone]
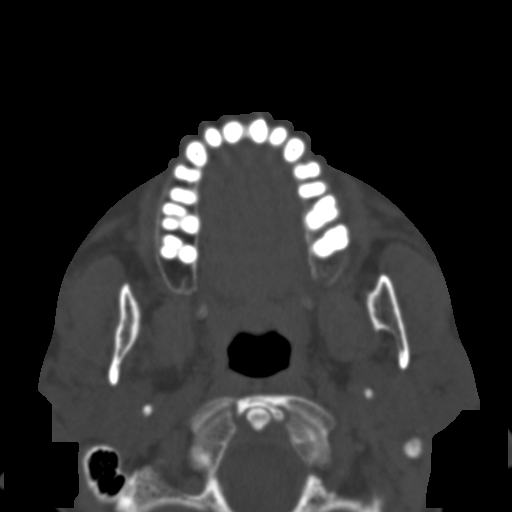
[im 6/56  bone]
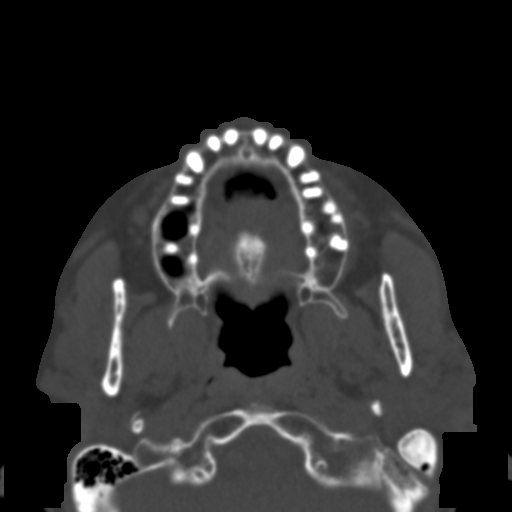
[im 10/56  bone]
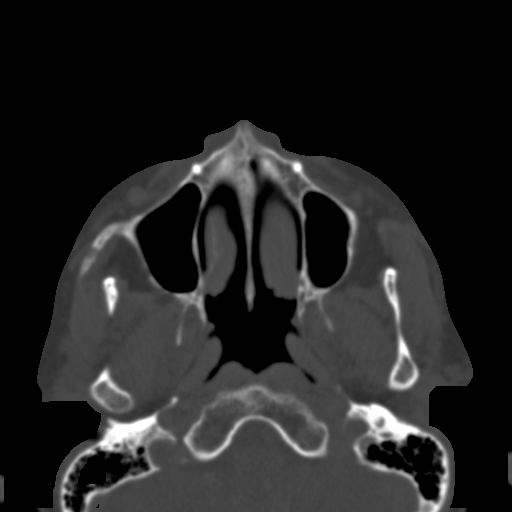
[im 14/56  bone]
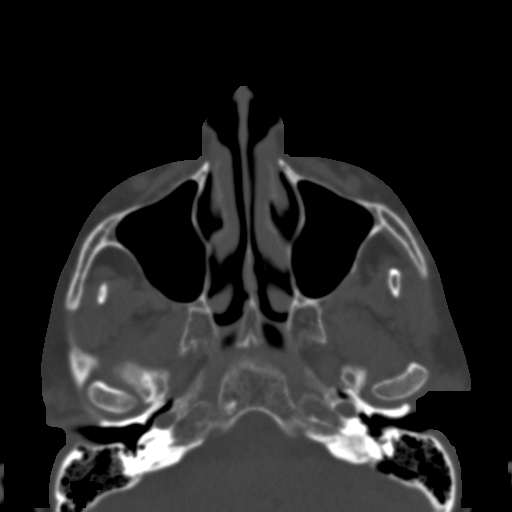
[im 18/56  brain]
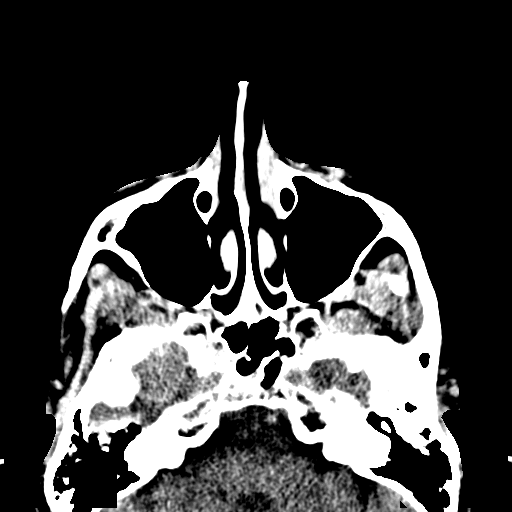
[im 18/56  bone]
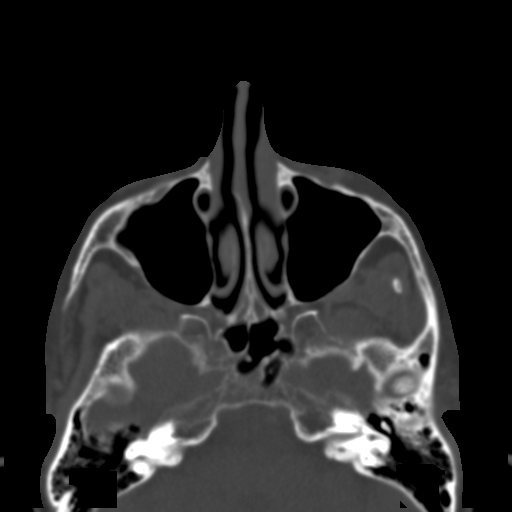
[im 21/56  bone]
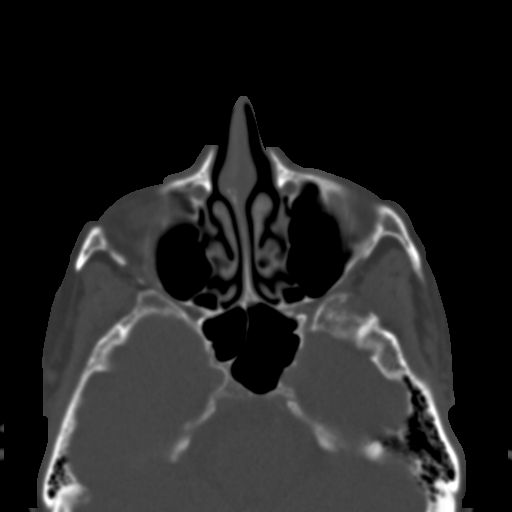
[im 25/56  bone]
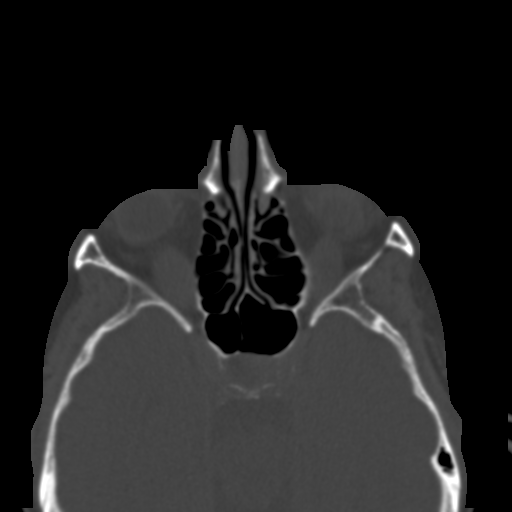
[im 29/56  bone]
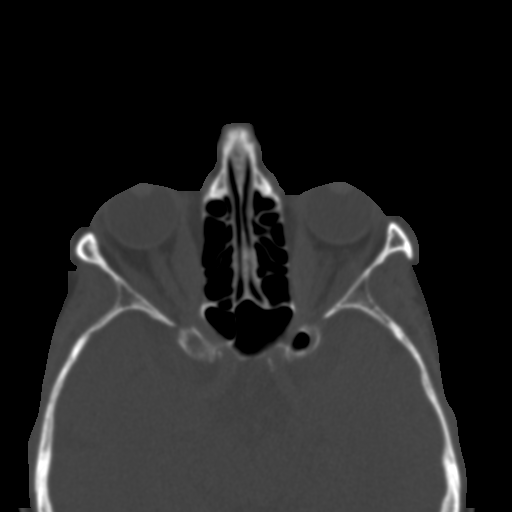
[im 31/56  brain]
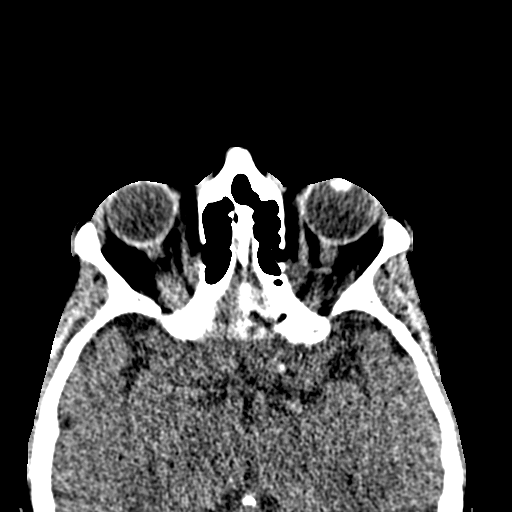
[im 31/56  bone]
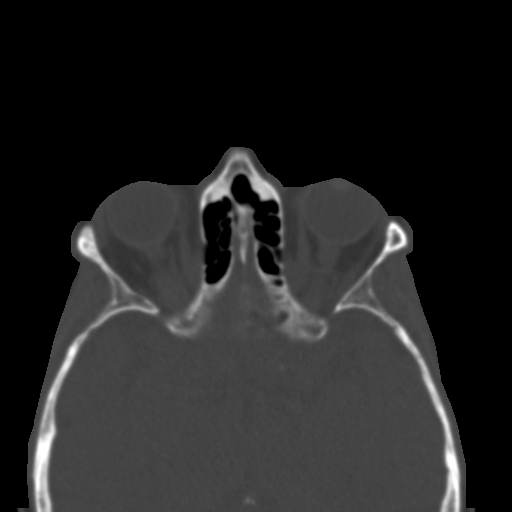
[im 35/56  bone]
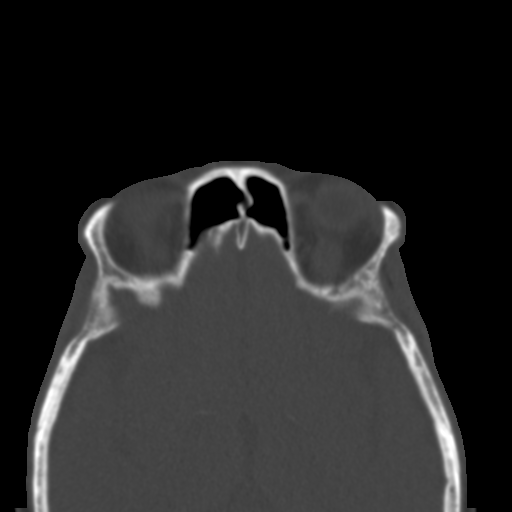
[im 38/56  bone]
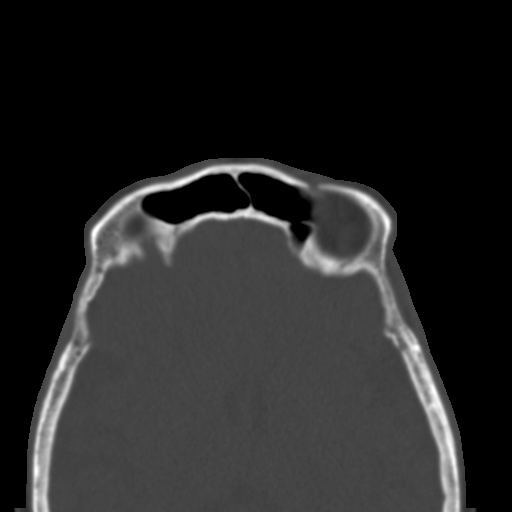
[im 42/56  bone]
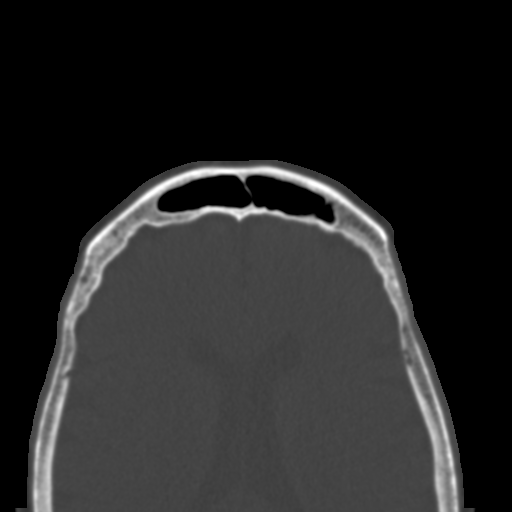
[im 46/56  brain]
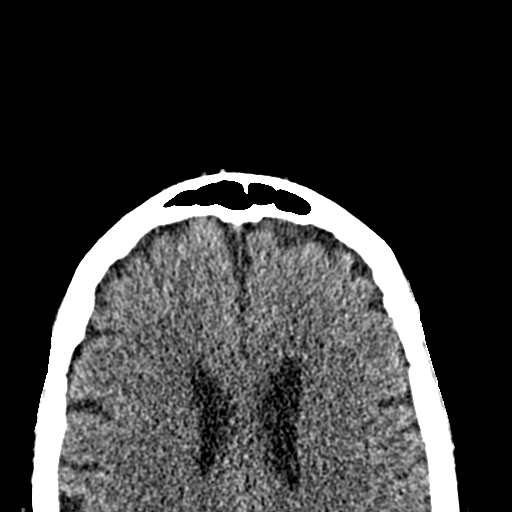
[im 46/56  bone]
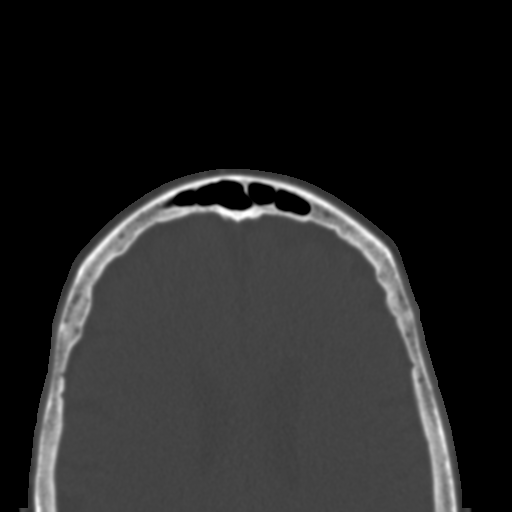
[im 50/56  bone]
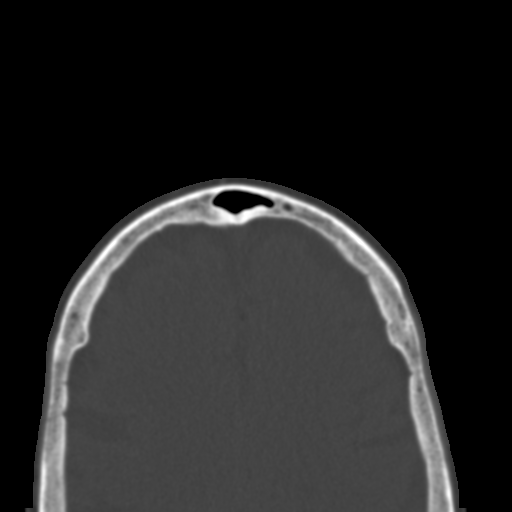
[im 54/56  bone]
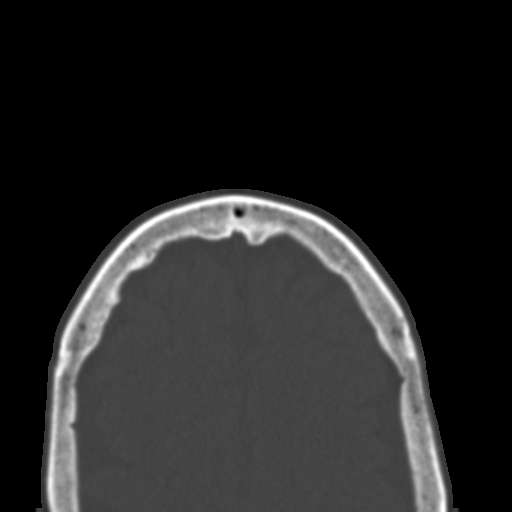

[15 of 30 positions shown; findings below may reference images not displayed]

FINDINGS: Orbits: There is marked thickening of the extra-ocular muscles
bilaterally with relative sparing of the lateral rectus bilaterally.
There is mild proptosis. These findings are significantly increased
compared to the CT orbits from [DATE] and [DATE]. For
reference, the inferior rectus muscles now measure up to the orbital
fat is preserved bilaterally. There is crowding of the optic nerves
at the orbital apices without definite evidence of frank
compression. The globes themselves are normal.

Visible paranasal sinuses: The imaged paranasal sinuses are clear.

Soft tissues: Unremarkable.

Osseous: There is no acute osseous abnormality. There is no
suspicious osseous lesion.

Limited intracranial: Imaged portions of the intracranial
compartment are unremarkable.
IMPRESSION: Marked thickening of the extra-ocular muscles bilaterally and mild
proptosis is increased compared to the prior study from [DATE],
in a pattern consistent with thyroid orbitopathy.

## 2021-11-19 ENCOUNTER — Other Ambulatory Visit: Payer: Self-pay | Admitting: Pharmacy Technician

## 2021-11-20 DIAGNOSIS — H532 Diplopia: Secondary | ICD-10-CM | POA: Diagnosis not present

## 2021-11-20 DIAGNOSIS — H05123 Orbital myositis, bilateral: Secondary | ICD-10-CM | POA: Diagnosis not present

## 2021-11-20 DIAGNOSIS — E0500 Thyrotoxicosis with diffuse goiter without thyrotoxic crisis or storm: Secondary | ICD-10-CM | POA: Diagnosis not present

## 2021-11-20 DIAGNOSIS — H02534 Eyelid retraction left upper eyelid: Secondary | ICD-10-CM | POA: Diagnosis not present

## 2021-11-20 DIAGNOSIS — H04123 Dry eye syndrome of bilateral lacrimal glands: Secondary | ICD-10-CM | POA: Diagnosis not present

## 2021-11-20 DIAGNOSIS — H02531 Eyelid retraction right upper eyelid: Secondary | ICD-10-CM | POA: Diagnosis not present

## 2021-12-02 DIAGNOSIS — E0500 Thyrotoxicosis with diffuse goiter without thyrotoxic crisis or storm: Secondary | ICD-10-CM | POA: Diagnosis not present

## 2021-12-02 DIAGNOSIS — H05123 Orbital myositis, bilateral: Secondary | ICD-10-CM | POA: Diagnosis not present

## 2021-12-03 ENCOUNTER — Telehealth: Payer: Self-pay | Admitting: Cardiology

## 2021-12-03 MED ORDER — METOPROLOL TARTRATE 25 MG PO TABS: ORAL_TABLET | ORAL | 11 refills | Status: DC

## 2021-12-03 NOTE — Telephone Encounter (Signed)
Sueanne Margarita, MD  Donnalee Curry K ?Caller: Unspecified (Today,  8:23 AM) ?OK to call in metoprolol tartrate '25mg'$  PRN for palpitations  ? ? ?Medication sent to pharmacy on file and pt made aware. ?

## 2021-12-03 NOTE — Telephone Encounter (Signed)
STAT if HR is under 50 or over 120 ?(normal HR is 60-100 beats per minute) ? ?What is your heart rate? 161 (yesterday currently at 27 ? ?Do you have a log of your heart rate readings (document readings)?   ? ?Do you have any other symptoms? headaches ? ?

## 2021-12-03 NOTE — Telephone Encounter (Signed)
Called and spoke to patient who states that she received her Solu-Medrol infusion yesterday and about 2 hours later she felt like "my heart was beating out of my chest." Pt checked rate via pulse ox and it showed 161bpm. Pt condones headache and feeling shaky, but states this is usual for her post-steroid infusions. She stopped her Metoprolol due to bradycardia, was told by Radford Pax to take it as needed in the future, but her current bottle at home expired in July 2022. Pt's current HR is 90 at time of call and no symptoms. She states that she is wanting a refill on her Metoprolol in case this happens again at her next 2 scheduled infusions. Has appt scheduled for 01/27/22. ?Will route to Turner for approval/denial. ?

## 2021-12-09 DIAGNOSIS — E0500 Thyrotoxicosis with diffuse goiter without thyrotoxic crisis or storm: Secondary | ICD-10-CM | POA: Diagnosis not present

## 2021-12-09 DIAGNOSIS — H05123 Orbital myositis, bilateral: Secondary | ICD-10-CM | POA: Diagnosis not present

## 2021-12-15 DIAGNOSIS — E0500 Thyrotoxicosis with diffuse goiter without thyrotoxic crisis or storm: Secondary | ICD-10-CM | POA: Diagnosis not present

## 2021-12-16 DIAGNOSIS — H05123 Orbital myositis, bilateral: Secondary | ICD-10-CM | POA: Diagnosis not present

## 2021-12-16 DIAGNOSIS — E0500 Thyrotoxicosis with diffuse goiter without thyrotoxic crisis or storm: Secondary | ICD-10-CM | POA: Diagnosis not present

## 2021-12-25 ENCOUNTER — Other Ambulatory Visit (HOSPITAL_COMMUNITY): Payer: Self-pay | Admitting: *Deleted

## 2021-12-25 DIAGNOSIS — H532 Diplopia: Secondary | ICD-10-CM | POA: Diagnosis not present

## 2021-12-25 DIAGNOSIS — E0500 Thyrotoxicosis with diffuse goiter without thyrotoxic crisis or storm: Secondary | ICD-10-CM | POA: Diagnosis not present

## 2021-12-25 DIAGNOSIS — H40053 Ocular hypertension, bilateral: Secondary | ICD-10-CM | POA: Diagnosis not present

## 2021-12-25 DIAGNOSIS — H04123 Dry eye syndrome of bilateral lacrimal glands: Secondary | ICD-10-CM | POA: Diagnosis not present

## 2021-12-25 DIAGNOSIS — H05123 Orbital myositis, bilateral: Secondary | ICD-10-CM | POA: Diagnosis not present

## 2021-12-26 ENCOUNTER — Encounter (HOSPITAL_COMMUNITY)
Admission: RE | Admit: 2021-12-26 | Discharge: 2021-12-26 | Disposition: A | Discharge status: Home / Self Care | Payer: Medicare Other | Source: Ambulatory Visit | Attending: Ophthalmology | Admitting: Ophthalmology

## 2021-12-26 DIAGNOSIS — E0500 Thyrotoxicosis with diffuse goiter without thyrotoxic crisis or storm: Secondary | ICD-10-CM | POA: Diagnosis not present

## 2021-12-26 DIAGNOSIS — E079 Disorder of thyroid, unspecified: Secondary | ICD-10-CM | POA: Insufficient documentation

## 2021-12-26 DIAGNOSIS — H5789 Other specified disorders of eye and adnexa: Secondary | ICD-10-CM | POA: Diagnosis not present

## 2021-12-26 MED ORDER — TEPROTUMUMAB-TRBW 500 MG IV SOLR
10.0000 mg/kg | Freq: Once | INTRAVENOUS | Status: AC
  Administered 2021-12-26: 809.2 mg via INTRAVENOUS
  Filled 2021-12-26: qty 17

## 2021-12-29 MED FILL — Teprotumumab-trbw For IV Soln 500 MG: INTRAVENOUS | Qty: 3 | Status: AC

## 2022-01-05 ENCOUNTER — Encounter: Payer: Self-pay | Admitting: Endocrinology

## 2022-01-05 ENCOUNTER — Ambulatory Visit (INDEPENDENT_AMBULATORY_CARE_PROVIDER_SITE_OTHER): Payer: Medicare Other | Admitting: Endocrinology

## 2022-01-05 VITALS — BP 132/72 | HR 60 | Ht 62.0 in | Wt 183.8 lb

## 2022-01-05 DIAGNOSIS — E0590 Thyrotoxicosis, unspecified without thyrotoxic crisis or storm: Secondary | ICD-10-CM | POA: Diagnosis not present

## 2022-01-05 LAB — T3, FREE: T3, Free: 2.9 pg/mL (ref 2.3–4.2)

## 2022-01-05 LAB — T4, FREE: Free T4: 0.55 ng/dL — ABNORMAL LOW (ref 0.60–1.60)

## 2022-01-05 LAB — TSH: TSH: 0.01 u[IU]/mL — ABNORMAL LOW (ref 0.35–5.50)

## 2022-01-05 NOTE — Progress Notes (Signed)
Patient ID: Christina [name], female   DOB: [birth year], 71 y.o.   MRN: [id number] ? ?                                                                                              ? ?             ? ?Reason for Appointment:  Hyperthyroidism, follow-up visit ? ? ?Chief complaint: Follow-up of thyroid ? ? History of Present Illness:  ? ?Prior history: ?For the last several years the patient has had symptoms of occasional palpitations, shortness of breath on exertion, shakiness and feeling somewhat warm.  However she did not have any recent worsening of the symptoms except on the morning of hospitalization ?She previously did not have any unusual weight loss, anxiety or fatigue ?She was also having some swelling of her legs prior to admission ?Since she was having difficulties with tachycardia and atrial fibrillation her thyroid level was checked in the hospital and indicated hyperthyroidism ? ?RECENT HISTORY: ? ?Baseline symptoms before treatment were occasional palpitations, shortness of breath on exertion, shakiness and feeling warm ?She has Graves' disease confirmed with a baseline thyrotropin receptor antibody level of 3.9 ? ?She has been treated with methimazole since 05/2019 ? ?Her dose has fluctuated between 5 and 10 mg daily ? ?She ambulated periodic adjustments of her dose ?  ?She is taking 7.5 mg in the morning and 5 mg in the evening since 8/22  ?The dose has been continued unchanged for some time ? ?Again she complains of feeling tired and gets exhausted in the evenings ?No consistent weight change ? ?She has been regular with her methimazole regimen, however did not come back for follow-up last month as recommended ? ?Thyroid levels pending ? ?Last thyrotropin receptor antibody in 1/23 was markedly increased  ? ? ?Wt Readings from Last 3 Encounters:  ?01/05/22 183 lb 12.8 oz (83.4 kg)  ?12/26/21 178 lb (80.7 kg)  ?09/29/21 185 lb (83.9 kg)  ? ?Thyroid function tests as follows:    ? ?Lab Results  ?Component Value  Date  ? FREET4 0.85 09/29/2021  ? FREET4 0.94 07/28/2021  ? FREET4 0.85 06/12/2021  ? T3FREE 3.8 07/28/2021  ? T3FREE 3.9 06/12/2021  ? T3FREE 6.1 (H) 05/01/2021  ? TSH 0.00 (L) 09/29/2021  ? TSH <0.01 (L) 07/28/2021  ? TSH <0.01 (L) 06/12/2021  ? ? ?Lab Results  ?Component Value Date  ? THYROTRECAB 149.00 (H) 09/29/2021  ? THYROTRECAB 26.80 (H) 05/01/2021  ? THYROTRECAB 11.20 (H) 09/26/2020  ? ?OPHTHALMOPATHY: ? ?Since about 11/20 she was having more swelling of her eyes, some double vision, and a pressure sensation ?Subsequently in December she had more fuzzy vision, feeling of fullness in her eyes and continued double vision ?She went to the urgent care center and was given a Medrol Dosepak ? ?With the recurrence of the symptoms in 1/21 she was again started on tapering dose of prednisone ?Has been treated by oculoplastic ophthalmologist and she had been on Medrol high-dose weekly injections started in late February with only mild and short lasting relief ? ?She had  a complete the course of Tepezza  infusions ? ?This improved her vision and has reduced the swelling and fullness in her eyes ? ?Recently she is complaining about her eyes looking glassy as well as feeling of the eyes bulging more  ?She says she is concerned about inflammation in her eyes and is taking ibuprofen around ?She does not want any steroids because she thinks this will make her optic nerve atrophy ? ?She uses refresh drops for dryness ? ?CT scan of orbits has not shown any proptosis and only thickening of the eye muscles which has improved ? ? ? ?Allergies as of 01/05/2022   ? ?   Reactions  ? Hydrochlorothiazide Other (See Comments), Hypertension  ? Wildly fluctuates B/P low-to-high, then high-to-low, in a matter of minutes  ? Hydrocodone-acetaminophen Nausea And Vomiting  ? Patient DOES NOT wish to take EVER again...  ? Other Nausea And Vomiting, Other (See Comments)  ? Stronger pain meds (post-op) caused dehydration and constipation  ? ?   ? ?  ?Medication List  ?  ? ?  ? Accurate as of January 05, 2022  3:09 PM. If you have any questions, ask your nurse or doctor.  ?  ?  ? ?  ? ?acetaminophen 650 MG CR tablet ?Commonly known as: TYLENOL ?Take 1,300 mg by mouth in the morning. Take 2 tablets (1300 mg) by mouth scheduled every morning & may take an additional dose (1300 mg) in the evening/afternoon if needed for pain. ?  ?adapalene 0.1 % gel ?Commonly known as: DIFFERIN ?Apply 1 application topically at bedtime. ?  ?amlodipine-benazepril 2.5-10 MG capsule ?Commonly known as: LOTREL ?Take 1 capsule by mouth daily. ?  ?amoxicillin 500 MG capsule ?Commonly known as: AMOXIL ?SMARTSIG:4 Capsule(s) By Mouth Once ?  ?carboxymethylcellulose 0.5 % Soln ?Commonly known as: REFRESH PLUS ?Place 1-2 drops into both eyes 3 (three) times daily as needed (dry/irritated eyes.). ?  ?Dapsone 5 % topical gel ?Apply 1 application topically in the morning. Apply to the face daily as directed ?  ?furosemide 20 MG tablet ?Commonly known as: LASIX ?Take 1 tablet (20 mg total) by mouth daily. ?  ?IBUPROFEN PO ?Take by mouth. ?  ?Lidocaine-Menthol Roll-On 4-1 % Liqd ?Generic drug: Lidocaine-Menthol ?Apply 1 application topically 3 (three) times daily as needed (leg pain). ?  ?loratadine 10 MG tablet ?Commonly known as: CLARITIN ?Take 10 mg by mouth in the morning. ?  ?methimazole 5 MG tablet ?Commonly known as: TAPAZOLE ?TAKE 1 & 1/2 (ONE & ONE-HALF) TABLETS BY MOUTH IN THE MORNING AND 1 IN THE EVENING ?  ?metoprolol tartrate 25 MG tablet ?Commonly known as: LOPRESSOR ?Take 1 tablet by mouth as needed for palpitations ?  ?promethazine 12.5 MG tablet ?Commonly known as: PHENERGAN ?Take 1 tablet (12.5 mg total) by mouth every 4 (four) hours as needed for nausea or vomiting. ?  ?sulfamethoxazole-trimethoprim 400-80 MG tablet ?Commonly known as: BACTRIM ?Take 1 tablet by mouth daily. ?  ?traMADol 50 MG tablet ?Commonly known as: Ultram ?Take 1-2 tablets (50-100 mg total) by mouth  every 6 (six) hours as needed for moderate pain or severe pain. ?  ? ?  ?     ? ?Past Medical History:  ?Diagnosis Date  ? Acquired dilation of ascending aorta and aortic root (HCC)   ? 4cm upper normal on Chest CT 09/2020  ? Arthritis   ? PAIN AND OA RIGHT KNEE; S/P LEFT TOTAL KNEE REPLACEMENT 05-07-14 - STILL IN PHYSICAL THERAPY  ?  Asthma   ? hx of - PT STATES MILD - NO LONGER REQUIRES INHALERS  ? Cancer [facility])   ? renal cell carcinoma  ? CHF (congestive heart failure) ([place])   ? Colitis   ? mild   ? GERD (gastroesophageal reflux disease)   ? H/O hiatal hernia   ? Hypertension   ? hx of 10 years ago ; PT'S BLOOD PRESSURE RECENTLY ELEVATED WHILE IN HOSP FOR SURG- NOT ON ANY B/P MEDS  ? Hyperthyroidism   ? PAF (paroxysmal atrial fibrillation) ([place])   ? ? ?Past Surgical History:  ?Procedure Laterality Date  ? IR RADIOLOGIST EVAL & MGMT  08/28/2021  ? KNEE CLOSED REDUCTION Left 06/05/2014  ? Procedure: CLOSED MANIPULATION KNEE;  Surgeon: Mauri Pole, MD;  [facility]: [facility];  Service: Orthopedics;  Laterality: Left;  ? RIGHT/LEFT HEART CATH AND CORONARY ANGIOGRAPHY N/A 05/31/2019  ? Procedure: RIGHT/LEFT HEART CATH AND CORONARY ANGIOGRAPHY;  Surgeon: Martinique, Peter M, MD;  Location: [place] [facility];  Service: Cardiovascular;  Laterality: N/A;  ? ROBOTIC ASSITED PARTIAL NEPHRECTOMY Right 03/07/2021  ? Procedure: XI ROBOTIC ASSITED RADICAL NEPHRECTOMY;  Surgeon: Ceasar Mons, MD;  [facility];  Service: Urology;  Laterality: Right;  ? TOTAL KNEE ARTHROPLASTY Left 05/07/2014  ? Procedure: LEFT TOTAL KNEE ARTHROPLASTY;  Surgeon: Mauri Pole, MD;  [facility];  Service: Orthopedics;  Laterality: Left;  ? TOTAL KNEE ARTHROPLASTY Right 06/05/2014  ? Procedure: RIGHT TOTAL KNEE ARTHROPLASTY;  Surgeon: Mauri Pole, MD;  [facility];  Service: Orthopedics;  Laterality: Right;  ? WISDOM TEETH EXTRACTIONS    ? ? ?Family History  ?Problem Relation Age of Onset  ? Hypothyroidism Mother   ? Graves' disease  Sister   ? Diabetes Brother   ? Diabetes Maternal Aunt   ? ? ?Social History:  reports that she has never smoked. She has never used smokeless tobacco. She reports that she does not drink alcohol and does

## 2022-01-16 ENCOUNTER — Ambulatory Visit (HOSPITAL_COMMUNITY): Payer: Medicare Other

## 2022-01-16 ENCOUNTER — Encounter (HOSPITAL_COMMUNITY)
Admission: RE | Admit: 2022-01-16 | Discharge: 2022-01-16 | Disposition: A | Discharge status: Home / Self Care | Payer: Medicare Other | Source: Ambulatory Visit | Attending: Ophthalmology | Admitting: Ophthalmology

## 2022-01-16 DIAGNOSIS — H5789 Other specified disorders of eye and adnexa: Secondary | ICD-10-CM | POA: Diagnosis not present

## 2022-01-16 DIAGNOSIS — E0500 Thyrotoxicosis with diffuse goiter without thyrotoxic crisis or storm: Secondary | ICD-10-CM | POA: Diagnosis not present

## 2022-01-16 DIAGNOSIS — E079 Disorder of thyroid, unspecified: Secondary | ICD-10-CM | POA: Diagnosis not present

## 2022-01-16 MED ORDER — TEPROTUMUMAB-TRBW 500 MG IV SOLR
20.0000 mg/kg | Freq: Once | INTRAVENOUS | Status: AC
  Administered 2022-01-16: 1614.29 mg via INTRAVENOUS
  Filled 2022-01-16: qty 33.9

## 2022-01-27 ENCOUNTER — Telehealth: Payer: Self-pay

## 2022-01-27 ENCOUNTER — Ambulatory Visit (INDEPENDENT_AMBULATORY_CARE_PROVIDER_SITE_OTHER): Payer: Medicare Other | Admitting: Cardiology

## 2022-01-27 ENCOUNTER — Ambulatory Visit (HOSPITAL_COMMUNITY): Payer: Medicare Other | Attending: Cardiology

## 2022-01-27 ENCOUNTER — Encounter: Payer: Self-pay | Admitting: Cardiology

## 2022-01-27 VITALS — BP 110/82 | HR 86 | Ht 62.0 in | Wt 187.0 lb

## 2022-01-27 DIAGNOSIS — I77819 Aortic ectasia, unspecified site: Secondary | ICD-10-CM | POA: Diagnosis not present

## 2022-01-27 DIAGNOSIS — I5032 Chronic diastolic (congestive) heart failure: Secondary | ICD-10-CM

## 2022-01-27 DIAGNOSIS — I272 Pulmonary hypertension, unspecified: Secondary | ICD-10-CM | POA: Insufficient documentation

## 2022-01-27 DIAGNOSIS — I480 Paroxysmal atrial fibrillation: Secondary | ICD-10-CM

## 2022-01-27 DIAGNOSIS — I10 Essential (primary) hypertension: Secondary | ICD-10-CM | POA: Diagnosis not present

## 2022-01-27 LAB — ECHOCARDIOGRAM COMPLETE
Area-P 1/2: 3.65 cm2
MV M vel: 6.06 m/s
MV Peak grad: 146.9 mmHg
P 1/2 time: 398 msec
Radius: 0.4 cm
S' Lateral: 3.3 cm

## 2022-01-27 MED ORDER — AMLODIPINE BESY-BENAZEPRIL HCL 2.5-10 MG PO CAPS
1.0000 | ORAL_CAPSULE | Freq: Every day | ORAL | 3 refills | Status: DC
Start: 2022-01-27 — End: 2023-02-02

## 2022-01-27 MED ORDER — FUROSEMIDE 20 MG PO TABS: 20.0000 mg | ORAL_TABLET | Freq: Every day | ORAL | 3 refills | Status: DC

## 2022-01-27 NOTE — Telephone Encounter (Signed)
-----  Message from Sueanne Margarita, MD sent at 01/27/2022  1:12 PM EDT ----- ?Echo showed normal heart function with EF 60 to 65% with increased stiffness of the heart muscle, mild leakiness of the mitral valve and moderate leakiness of the tricuspid valve mild leakiness of the aortic valve and mildly dilated ascending aorta at 42 mm.  This is stable compared to 2021.  She does have pulmonary hypertension which is new with a PASP of 45 mmHg.  Please order [facility] home sleep study rule out sleep apnea.  Please have her come in to check an ANA, rheumatoid factor, CRP and ESR.  These are all of her pulmonary hypertension ?

## 2022-01-27 NOTE — Telephone Encounter (Signed)
?  STOP BANG RISK ASSESSMENT ?S (snore) Have you been told that you snore?     YES ?  ?T (tired) Are you often tired, fatigued, or sleepy during the day?  ? YES  ?O (obstruction) Do you stop breathing, choke, or gasp during sleep? NO ?  ?P (pressure) Do you have or are you being treated for high blood pressure? YES ?  ?B (BMI) Is your body index greater than 35 kg/m? YES ?  ?A (age) Are you 69 years old or older? YES ?  ?N (neck) Do you have a neck circumference greater than 16 inches?  ? NO ?  ?G (gender) Are you a female? NO ?  ?TOTAL STOP/BANG ?YES? ANSWERS 5  ? ?                                                                    For Office Use Only              Procedure Order Form    ?YES to 3+ Stop Bang questions OR two clinical symptoms - patient qualifies for WatchPAT (CPT 95800)           ? ?Clinical Notes: Will consult Sleep Specialist and refer for management of therapy due to patient increased risk of Sleep Apnea. Ordering a sleep study due to the following two clinical symptoms: Excessive daytime sleepiness G47.10 / Gastroesophageal reflux K21.9 / Nocturia R35.1 / Morning Headaches G44.221 / Difficulty concentrating R41.840 / Memory problems or poor judgment G31.84 / Personality changes or irritability R45.4 / Loud snoring R06.83 / Depression F32.9 / Unrefreshed by sleep G47.8 / Impotence N52.9 / History of high blood pressure R03.0 / Insomnia G47.00  ? ?I understand that I am proceeding with a home sleep apnea test as ordered by my treating physician. I understand that untreated sleep apnea is a serious cardiovascular risk factor and it is my responsibility to perform the test and seek management for sleep apnea. I will be contacted with the results and be managed for sleep apnea by a local sleep physician. I will be receiving equipment and further instructions from [facility]. I shall promptly ship back the equipment via the included mailing label. I understand my insurance will be billed for the  test and as the patient I am responsible for any insurance related out-of-pocket costs incurred. I have been provided with written instructions and can call for additional video or telephonic instruction, with 24-hour availability of qualified personnel to answer any questions: Patient Help Desk [phone]. ? ?Patient Telemedicine Verbal Consent ? ? ? ?

## 2022-01-27 NOTE — Progress Notes (Signed)
?Cardiology Office Note:   ? ?Date:  01/27/2022  ? ?ID:  Christina [name], DOB 02-18-1951, MRN [id number] ? ?PCP:  Mateo Flow, MD  ?Cardiologist:  Fransico Him, MD   ? ?Referring MD: Mateo Flow, MD  ? ?Chief Complaint  ?Patient presents with  ? Congestive Heart Failure  ? Hypertension  ? ? ?History of Present Illness:   ? ?Christina [name] is a 71 y.o. female with a hx of chronic systolic CHF, normal coronary arteries, PAF, hyperthyroidism, asthma, GERD and HTN.  She still has problems with hyperthyroidism and sees Endocrine and is on Tapazole. She had some problems with elevated BPs and SOB after getting an infusion of drug for her thyroid eye disease .   ? ?She is here today for followup.  Since I saw her last she has had a flare of her thyroid eye disease and hyperthyroid again and is on Topazole and IV infusions for thyroid eye disease.  She has chronic DOE and LE edema that are stable.  The DOE occurs with walking. She denies any chest pain or pressure, PND, orthopnea, dizziness, or syncope. She had some palpitations when she was on a steroid taper but that has resolved. She is compliant with her meds and is tolerating meds with no SE.    ? ?Past Medical History:  ?Diagnosis Date  ? Acquired dilation of ascending aorta and aortic root (HCC)   ? 4cm upper normal on Chest CT 09/2020  ? Arthritis   ? PAIN AND OA RIGHT KNEE; S/P LEFT TOTAL KNEE REPLACEMENT 05-07-14 - STILL IN PHYSICAL THERAPY  ? Asthma   ? hx of - PT STATES MILD - NO LONGER REQUIRES INHALERS  ? Cancer [facility])   ? renal cell carcinoma  ? CHF (congestive heart failure) ([place])   ? Colitis   ? mild   ? GERD (gastroesophageal reflux disease)   ? H/O hiatal hernia   ? Hypertension   ? hx of 10 years ago ; PT'S BLOOD PRESSURE RECENTLY ELEVATED WHILE IN HOSP FOR SURG- NOT ON ANY B/P MEDS  ? Hyperthyroidism   ? PAF (paroxysmal atrial fibrillation) ([place])   ? ? ?Past Surgical History:  ?Procedure Laterality Date  ? IR RADIOLOGIST EVAL & MGMT  08/28/2021  ? KNEE CLOSED  REDUCTION Left 06/05/2014  ? Procedure: CLOSED MANIPULATION KNEE;  Surgeon: Mauri Pole, MD;  [facility];  Service: Orthopedics;  Laterality: Left;  ? RIGHT/LEFT HEART CATH AND CORONARY ANGIOGRAPHY N/A 05/31/2019  ? Procedure: RIGHT/LEFT HEART CATH AND CORONARY ANGIOGRAPHY;  Surgeon: Martinique, Peter M, MD;  Location: [place] [facility];  Service: Cardiovascular;  Laterality: N/A;  ? ROBOTIC ASSITED PARTIAL NEPHRECTOMY Right 03/07/2021  ? Procedure: XI ROBOTIC ASSITED RADICAL NEPHRECTOMY;  Surgeon: Ceasar Mons, MD;  [facility];  Service: Urology;  Laterality: Right;  ? TOTAL KNEE ARTHROPLASTY Left 05/07/2014  ? Procedure: LEFT TOTAL KNEE ARTHROPLASTY;  Surgeon: Mauri Pole, MD;  [facility];  Service: Orthopedics;  Laterality: Left;  ? TOTAL KNEE ARTHROPLASTY Right 06/05/2014  ? Procedure: RIGHT TOTAL KNEE ARTHROPLASTY;  Surgeon: Mauri Pole, MD;  [facility];  Service: Orthopedics;  Laterality: Right;  ? WISDOM TEETH EXTRACTIONS    ? ? ?Current Medications: ?Current Meds  ?Medication Sig  ? acetaminophen (TYLENOL) 650 MG CR tablet Take 1,300 mg by mouth in the morning. Take 2 tablets (1300 mg) by mouth scheduled every morning & may take an additional dose (1300 mg)  in the evening/afternoon if needed for pain.  ? adapalene (DIFFERIN) 0.1 % gel Apply 1 application topically at bedtime.  ? amoxicillin (AMOXIL) 500 MG capsule SMARTSIG:4 Capsule(s) By Mouth Once  ? carboxymethylcellulose (REFRESH PLUS) 0.5 % SOLN Place 1-2 drops into both eyes 3 (three) times daily as needed (dry/irritated eyes.).  ? Dapsone 5 % topical gel Apply 1 application topically in the morning. Apply to the face daily as directed  ? fluticasone (FLONASE) 50 MCG/ACT nasal spray Place into both nostrils as needed for allergies or rhinitis.  ? Lidocaine-Menthol (LIDOCAINE-MENTHOL ROLL-ON) 4-1 % LIQD Apply 1 application topically 3 (three) times daily as needed (leg pain).  ? loratadine (CLARITIN) 10 MG tablet  Take 10 mg by mouth in the morning.  ? methimazole (TAPAZOLE) 5 MG tablet TAKE 1 & 1/2 (ONE & ONE-HALF) TABLETS BY MOUTH IN THE MORNING AND 1 IN THE EVENING  ? metoprolol tartrate (LOPRESSOR) 25 MG tablet Take 1 tablet by mouth as needed for palpitations  ? sulfamethoxazole-trimethoprim (BACTRIM) 400-80 MG tablet Take 1 tablet by mouth daily.  ? [DISCONTINUED] amlodipine-benazepril (LOTREL) 2.5-10 MG capsule Take 1 capsule by mouth daily.  ? [DISCONTINUED] furosemide (LASIX) 20 MG tablet Take 1 tablet (20 mg total) by mouth daily.  ?  ? ?Allergies:   Hydrochlorothiazide, Hydrocodone-acetaminophen, and Other  ? ?Social History  ? ?Socioeconomic History  ? Marital status: Single  ?  Spouse name: Not on file  ? Number of children: Not on file  ? Years of education: Not on file  ? Highest education level: Not on file  ?Occupational History  ? Not on file  ?Tobacco Use  ? Smoking status: Never  ? Smokeless tobacco: Never  ?Vaping Use  ? Vaping Use: Never used  ?Substance and Sexual Activity  ? Alcohol use: No  ? Drug use: No  ? Sexual activity: Not Currently  ?Other Topics Concern  ? Not on file  ?Social History Narrative  ? Not on file  ? ?Social Determinants of Health  ? ?Financial Resource Strain: Not on file  ?Food Insecurity: Not on file  ?Transportation Needs: Not on file  ?Physical Activity: Not on file  ?Stress: Not on file  ?Social Connections: Not on file  ?  ? ?Family History: ?The patient's family history includes Diabetes in her brother and maternal aunt; Christina [name]' disease in her sister; Hypothyroidism in her mother. ? ?ROS:   ?Please see the history of present illness.    ?ROS  ?All other systems reviewed and negative.  ? ?EKGs/Labs/Other Studies Reviewed:   ? ?The following studies were reviewed today: ?none ? ?EKG:  EKG is ordered today and demonstrates NSR with ST changes ?Recent Labs: ?03/10/2021: BUN 14; Creatinine, Ser 1.01; Potassium 3.8; Sodium 136 ?09/29/2021: ALT 10; Hemoglobin 12.9; Platelets  300.0 ?01/05/2022: TSH 0.01  ? ?Recent Lipid Panel ?   ?Component Value Date/Time  ? CHOL 142 05/30/2019 1852  ? TRIG 42 05/30/2019 1852  ? HDL 55 05/30/2019 1852  ? CHOLHDL 2.6 05/30/2019 1852  ? VLDL 8 05/30/2019 1852  ? [place] 79 05/30/2019 1852  ? ? ?Physical Exam:   ? ?VS:  BP 110/82   Pulse 86   Ht '5\' 2"'$  (1.575 m)   Wt 187 lb (84.8 kg)   SpO2 97%   BMI 34.20 kg/m?    ? ?Wt Readings from Last 3 Encounters:  ?01/27/22 187 lb (84.8 kg)  ?01/16/22 178 lb (80.7 kg)  ?01/05/22 183 lb 12.8 oz (83.4 kg)  ?  ?  GEN: Well nourished, well developed in no acute distress ?HEENT: Normal ?NECK: No JVD; No carotid bruits ?LYMPHATICS: No lymphadenopathy ?CARDIAC:RRR, no murmurs, rubs, gallops ?RESPIRATORY:  Clear to auscultation without rales, wheezing or rhonchi  ?ABDOMEN: Soft, non-tender, non-distended ?MUSCULOSKELETAL:  No edema; No deformity  ?SKIN: Warm and dry ?NEUROLOGIC:  Alert and oriented x 3 ?PSYCHIATRIC:  Normal affect   ?ASSESSMENT:   ? ?1. Chronic diastolic congestive heart failure ([place])   ?2. PAF (paroxysmal atrial fibrillation) ([place])   ?3. Essential hypertension   ?4. Acquired dilation of ascending aorta and aortic root (HCC)   ? ?PLAN:   ? ?In order of problems listed above: ? ?Chronic diastolic CHF ?- cath 12/22/8885 with normal coronaries however reduced LV function on echocardiogram.   ?-2D echo done 08/2020 showed normal LVF with EF 55% with mild LVE ?-she appears euvolemic on exam today ?-Her weight has been variable recently due to issues with her thyroid and steroids  ?-Continue prescription drug management with Lasix 20 mg daily with as needed refills ?-I have personally reviewed and interpreted outside labs performed by patient's PCP which showed serum creatinine 1.04 on 11/20/2021 ? ?PAF ?-occurred in setting of Grave's dz but CHADS2VASC score is 4 ?-She continues to remain in normal sinus rhythm  ?-she did have some palpitations while taking steroids ?-she is no longer on DOAC as her afib occurred  in setting of hyperthyroidism ?-she has a [employer] that she uses and when she had a flare of hyperthyroidism and was on steroids she took some readings on her [facility] that read PAF but has not had palpitation

## 2022-01-27 NOTE — Telephone Encounter (Signed)
The patient has been notified of the result and verbalized understanding.  All questions (if any) were answered. ?Antonieta Iba, RN 01/27/2022 4:44 PM  ?Patient would like to come in on Friday 5/12 around 1030am to pick up her sleep study and have her lab work done.  ?

## 2022-01-27 NOTE — Patient Instructions (Signed)
Medication Instructions:  Your physician recommends that you continue on your current medications as directed. Please refer to the Current Medication list given to you today.  *If you need a refill on your cardiac medications before your next appointment, please call your pharmacy*  Follow-Up: At [facility], you and your health needs are our priority.  As part of our continuing mission to provide you with exceptional heart care, we have created designated Provider Care Teams.  These Care Teams include your primary Cardiologist (physician) and Advanced Practice Providers (APPs -  Physician Assistants and Nurse Practitioners) who all work together to provide you with the care you need, when you need it.   Your next appointment:   6 month(s)  The format for your next appointment:   In Person  Provider:   Traci Turner, MD     Important Information About Sugar       

## 2022-01-28 ENCOUNTER — Other Ambulatory Visit: Payer: Self-pay | Admitting: Urology

## 2022-01-28 DIAGNOSIS — D49512 Neoplasm of unspecified behavior of left kidney: Secondary | ICD-10-CM

## 2022-01-28 DIAGNOSIS — C641 Malignant neoplasm of right kidney, except renal pelvis: Secondary | ICD-10-CM

## 2022-01-28 NOTE — Telephone Encounter (Signed)
I s/w the pt today and informed her that Friday 5/12 @ 10:30 will be fine to come in for set up for [facility] sleep study.  ?

## 2022-01-29 ENCOUNTER — Other Ambulatory Visit: Payer: Self-pay | Admitting: Interventional Radiology

## 2022-01-29 DIAGNOSIS — N2889 Other specified disorders of kidney and ureter: Secondary | ICD-10-CM

## 2022-01-30 ENCOUNTER — Other Ambulatory Visit: Payer: Medicare Other | Admitting: *Deleted

## 2022-01-30 DIAGNOSIS — C641 Malignant neoplasm of right kidney, except renal pelvis: Secondary | ICD-10-CM | POA: Diagnosis not present

## 2022-01-30 DIAGNOSIS — I272 Pulmonary hypertension, unspecified: Secondary | ICD-10-CM | POA: Diagnosis not present

## 2022-01-30 DIAGNOSIS — D49512 Neoplasm of unspecified behavior of left kidney: Secondary | ICD-10-CM | POA: Diagnosis not present

## 2022-01-30 NOTE — Telephone Encounter (Signed)
Pt came in today and has been set up for sleep study. Pt aware to not open the box until she has been called with the PIN #. Pt signed waiver.  ?

## 2022-01-30 NOTE — Telephone Encounter (Signed)
SET UP DATE 01/30/22 ?

## 2022-01-31 LAB — SEDIMENTATION RATE: Sed Rate: 14 mm/hr (ref 0–40)

## 2022-01-31 LAB — RHEUMATOID FACTOR: Rheumatoid fact SerPl-aCnc: <10 IU/mL (ref <14.0)

## 2022-01-31 LAB — ANA: Anti Nuclear Antibody (ANA): NEGATIVE

## 2022-01-31 LAB — C-REACTIVE PROTEIN: CRP: <1 mg/L (ref 0–10)

## 2022-02-06 ENCOUNTER — Ambulatory Visit (HOSPITAL_COMMUNITY)
Admission: RE | Admit: 2022-02-06 | Discharge: 2022-02-06 | Disposition: A | Discharge status: Home / Self Care | Payer: Medicare Other | Source: Ambulatory Visit | Attending: Ophthalmology | Admitting: Ophthalmology

## 2022-02-06 DIAGNOSIS — H0520 Unspecified exophthalmos: Secondary | ICD-10-CM | POA: Diagnosis not present

## 2022-02-06 DIAGNOSIS — E0590 Thyrotoxicosis, unspecified without thyrotoxic crisis or storm: Secondary | ICD-10-CM | POA: Diagnosis present

## 2022-02-06 MED ORDER — TEPROTUMUMAB-TRBW 500 MG IV SOLR
20.0000 mg/kg | Freq: Once | INTRAVENOUS | Status: AC
  Administered 2022-02-06: 1676.19 mg via INTRAVENOUS
  Filled 2022-02-06: qty 35.2

## 2022-02-09 DIAGNOSIS — E0500 Thyrotoxicosis with diffuse goiter without thyrotoxic crisis or storm: Secondary | ICD-10-CM | POA: Diagnosis not present

## 2022-02-11 ENCOUNTER — Encounter (INDEPENDENT_AMBULATORY_CARE_PROVIDER_SITE_OTHER): Payer: Medicare Other | Admitting: Cardiology

## 2022-02-11 DIAGNOSIS — I480 Paroxysmal atrial fibrillation: Secondary | ICD-10-CM | POA: Diagnosis not present

## 2022-02-11 DIAGNOSIS — I270 Primary pulmonary hypertension: Secondary | ICD-10-CM

## 2022-02-11 NOTE — Telephone Encounter (Signed)
Called and made the patient aware that she may proceed with the [facility] Sleep Study. PIN # [id number] provided to the patient. Patient made aware that she will be contacted after the test has been read with the results and any recommendations. Patient verbalized understanding and thanked me for the call.   Pt states she will do sleep most likely tonight.

## 2022-02-11 NOTE — Telephone Encounter (Signed)
Prior Authorization for [employer] sent to medicare via web portal [place]

## 2022-02-11 NOTE — Telephone Encounter (Signed)
Pt called back. I transferred to [place].

## 2022-02-11 NOTE — Telephone Encounter (Signed)
Left message for the pt to call the office and s/w Arbie Cookey in the home sleep study dept. Once the pt calls back I will be happy to give her the PIN# [id number] and the ok to proceed.

## 2022-02-12 ENCOUNTER — Encounter: Payer: Self-pay | Admitting: Cardiology

## 2022-02-16 ENCOUNTER — Ambulatory Visit: Payer: Medicare Other

## 2022-02-16 DIAGNOSIS — I272 Pulmonary hypertension, unspecified: Secondary | ICD-10-CM

## 2022-02-16 NOTE — Procedures (Signed)
   SLEEP STUDY REPORT  Patient Information Study Date: 02/11/22 Patient Name: Christina [name] Patient ID: [id number] Birth Date: 08-27-2051 Age: 71 Gender: Female BMI: 34.5 (W=187 lb, H=5' 2'') Neck Circ.: 63 '' Referring Physician: Fransico Him, MD  TEST DESCRIPTION: Home sleep apnea testing was completed using the WatchPat, a Type 1 device, utilizing  peripheral arterial tonometry (PAT), chest movement, actigraphy, pulse oximetry, pulse rate, body position and snore.  AHI was calculated with apnea and hypopnea using valid sleep time as the denominator. RDI includes apneas,  hypopneas, and RERAs. The data acquired and the scoring of sleep and all associated events were performed in  accordance with the recommended standards and specifications as outlined in the AASM Manual for the Scoring of  Sleep and Associated Events 2.2.0 (2015).   FINDINGS: 1. No evidence of Obstructive Sleep Apnea with AHI 2.8/hr.  2. No Central Sleep Apnea. 3. Oxygen desaturations as low as 89 %. 4. Moderate snoring was present. O2 sats were < 88% for 0 minutes. 5. Total sleep time was 5 hrs and 47 min. 6. 13.9% of total sleep time was spent in REM sleep.  7. Shortened sleep onset latency at 5 min.  8. Shortened REM sleep onset latency at 81 min.  9. Total awakenings were 13.   DIAGNOSIS:  Normal study with no significant sleep disordered breathing.  RECOMMENDATIONS: 1. Normal study with no significant sleep disordered breathing. 2. Healthy sleep recommendations include: adequate nightly sleep (normal 7-9 hrs/night), avoidance of caffeine after  noon and alcohol near bedtime, and maintaining a sleep environment that is cool, dark and quiet. 3. Weight loss for overweight patients is recommended.  4. Snoring recommendations include: weight loss where appropriate, side sleeping, and avoidance of alcohol before  bed. 5. Operation of motor vehicle or dangerous equipment must be avoided when feeling drowsy,  excessively sleepy, or  mentally fatigued.  6. An ENT consultation which may be useful for specific causes of and possible treatment of bothersome snoring .  7. Weight loss may be of benefit in reducing the severity of snoring.   Signature: Electronically Signed: 02/16/22 Fransico Him, MD; [facility]; [place], American Board of  Sleep Medicine

## 2022-02-19 DIAGNOSIS — H532 Diplopia: Secondary | ICD-10-CM | POA: Diagnosis not present

## 2022-02-19 DIAGNOSIS — H40053 Ocular hypertension, bilateral: Secondary | ICD-10-CM | POA: Diagnosis not present

## 2022-02-19 DIAGNOSIS — H04123 Dry eye syndrome of bilateral lacrimal glands: Secondary | ICD-10-CM | POA: Diagnosis not present

## 2022-02-19 DIAGNOSIS — H05123 Orbital myositis, bilateral: Secondary | ICD-10-CM | POA: Diagnosis not present

## 2022-02-19 DIAGNOSIS — E0500 Thyrotoxicosis with diffuse goiter without thyrotoxic crisis or storm: Secondary | ICD-10-CM | POA: Diagnosis not present

## 2022-02-24 ENCOUNTER — Telehealth: Payer: Self-pay | Admitting: *Deleted

## 2022-02-24 NOTE — Telephone Encounter (Signed)
-----   Message from Lauralee Evener, [place] sent at 02/17/2022  8:20 AM EDT -----  ----- Message ----- From: Sueanne Margarita, MD Sent: 02/16/2022   1:32 PM EDT To: [facility] Sleep Studies  Normal home sleep study so in lab PSG will be ordered

## 2022-02-24 NOTE — Telephone Encounter (Signed)
The patient has been notified of the result via her mychart. Also Per dpr Left detailed message on voicemail and informed patient to call back with questions.Marolyn Hammock, [place] 01/29/2022 12:08 PM

## 2022-02-27 ENCOUNTER — Encounter (HOSPITAL_COMMUNITY)
Admission: RE | Admit: 2022-02-27 | Discharge: 2022-02-27 | Disposition: A | Discharge status: Home / Self Care | Payer: Medicare Other | Source: Ambulatory Visit | Attending: Ophthalmology | Admitting: Ophthalmology

## 2022-02-27 DIAGNOSIS — E079 Disorder of thyroid, unspecified: Secondary | ICD-10-CM | POA: Insufficient documentation

## 2022-02-27 DIAGNOSIS — E0500 Thyrotoxicosis with diffuse goiter without thyrotoxic crisis or storm: Secondary | ICD-10-CM | POA: Insufficient documentation

## 2022-02-27 DIAGNOSIS — H5789 Other specified disorders of eye and adnexa: Secondary | ICD-10-CM | POA: Insufficient documentation

## 2022-02-27 MED ORDER — TEPROTUMUMAB-TRBW 500 MG IV SOLR
1678.0000 mg | Freq: Once | INTRAVENOUS | Status: AC
  Administered 2022-02-27: 1678 mg via INTRAVENOUS
  Filled 2022-02-27: qty 35.24

## 2022-02-27 NOTE — Telephone Encounter (Signed)
Return call: Pt is aware and agreeable to normal results.

## 2022-03-04 ENCOUNTER — Ambulatory Visit
Admission: RE | Admit: 2022-03-04 | Discharge: 2022-03-04 | Disposition: A | Discharge status: Home / Self Care | Payer: Medicare Other | Source: Ambulatory Visit | Attending: Urology | Admitting: Urology

## 2022-03-04 DIAGNOSIS — I700 Atherosclerosis of aorta: Secondary | ICD-10-CM | POA: Diagnosis not present

## 2022-03-04 DIAGNOSIS — C641 Malignant neoplasm of right kidney, except renal pelvis: Secondary | ICD-10-CM | POA: Diagnosis not present

## 2022-03-04 DIAGNOSIS — K7689 Other specified diseases of liver: Secondary | ICD-10-CM | POA: Diagnosis not present

## 2022-03-04 DIAGNOSIS — I517 Cardiomegaly: Secondary | ICD-10-CM | POA: Diagnosis not present

## 2022-03-04 DIAGNOSIS — D49512 Neoplasm of unspecified behavior of left kidney: Secondary | ICD-10-CM

## 2022-03-04 IMAGING — MR MR ABDOMEN WO/W CM
11 of 17 series · 29 of 48 positions shown · IV contrast (17 ml multihance)
Comparison: [DATE]

CLINICAL DATA: Status post right nephrectomy for renal cell
carcinoma. Asymptomatic.

EXAM:
MRI ABDOMEN WITHOUT AND WITH CONTRAST
TECHNIQUE: Multiplanar multisequence MR imaging of the abdomen was performed
both before and after the administration of intravenous contrast.
CONTRAST:  17mL MULTIHANCE GADOBENATE DIMEGLUMINE 529 MG/ML IV SOLN

[Series 3: T2 · coronal · 5.0mm · 1.45mm/px · 2 of 33 slices shown (1 of 3)]
[im 1/33]
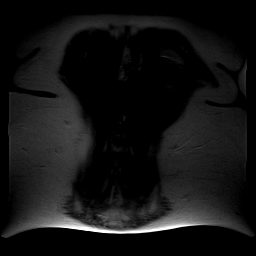
[im 33/33]
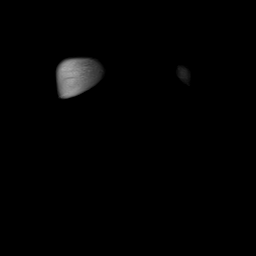

[Series 4: T2 · axial · 5.0mm · 1.41mm/px · z∈[-154,+96]mm · 2 of 41 slices shown (2 of 3)]
[im 1/41]
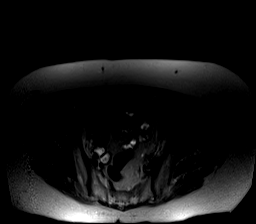
[im 41/41]
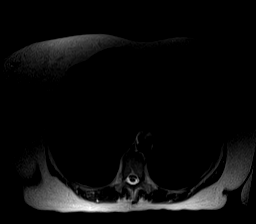

[Series 5: axial in out · axial · 5.5mm · 0.70mm/px · z∈[-149,+98]mm · 4 of 80 slices shown]
[im 1/80]
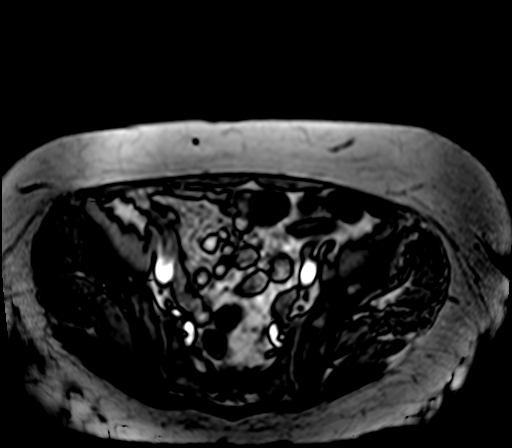
[im 27/80]
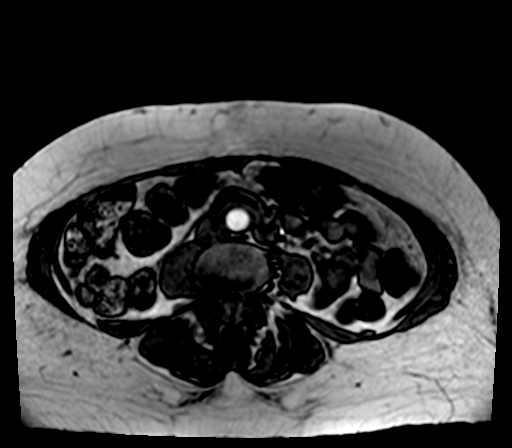
[im 53/80]
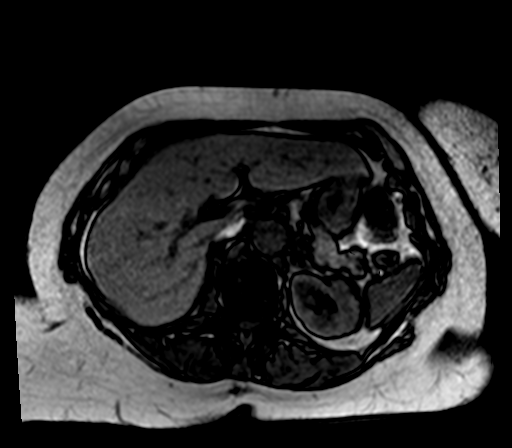
[im 80/80]
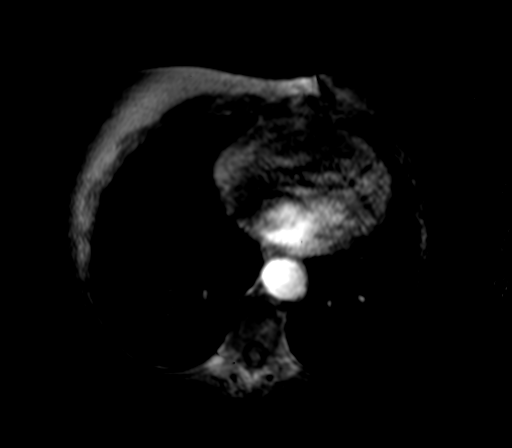

[Series 6: axial tru fisp · axial · 5.0mm · 1.41mm/px · z∈[-132,+104]mm · 2 of 42 slices shown]
[im 1/42]
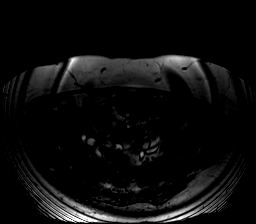
[im 42/42]
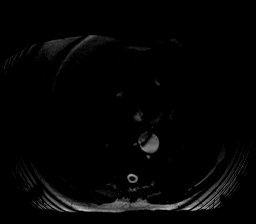

[Series 7: T2 · axial · 5.0mm · 0.70mm/px · z∈[-101,+143]mm · 2 of 40 slices shown (3 of 3)]
[im 1/40]
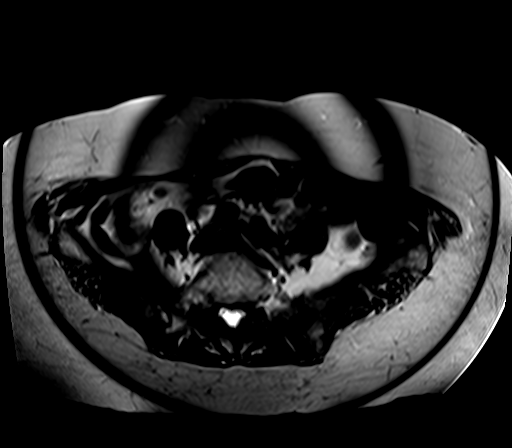
[im 40/40]
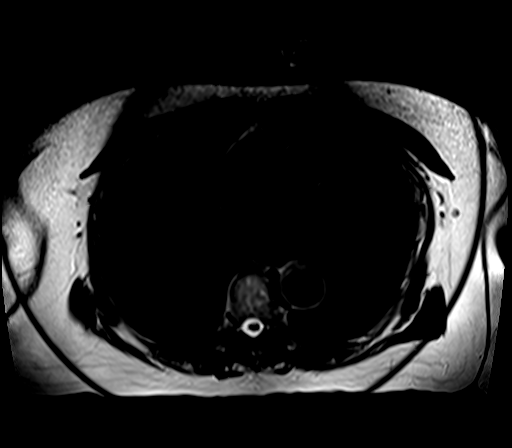

[Series 8: ep2d_diff_b50_500_800_p2_trig · axial · 5.0mm · 1.88mm/px · z∈[-101,+143]mm · 5 of 120 slices shown]
[im 1/120]
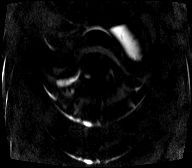
[im 30/120]
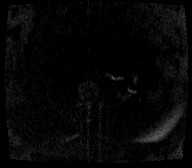
[im 60/120]
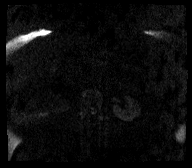
[im 90/120]
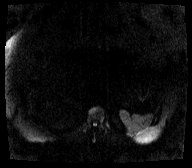
[im 120/120]
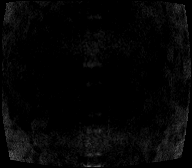

[Series 9: ep2d_diff_b50_500_800_p2_trig_adc · axial · 5.0mm · 1.88mm/px · 1 of 40 slices shown]
[im 1/40]
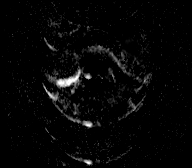

[Series 10: T1 dynamic · axial · non-contrast · 2.0mm · 0.78mm/px · z∈[-110,+96]mm · 3 of 104 slices shown]
[im 1/104]
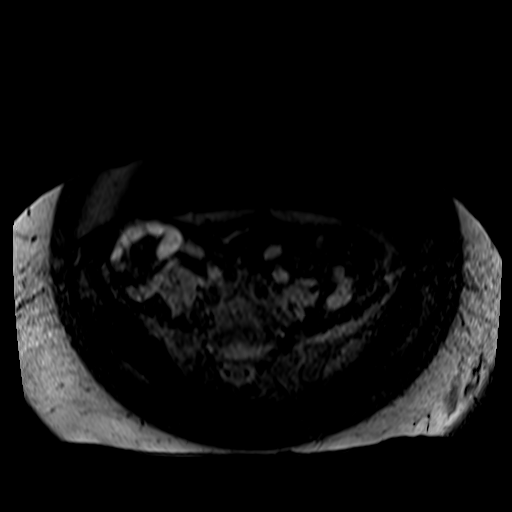
[im 52/104]
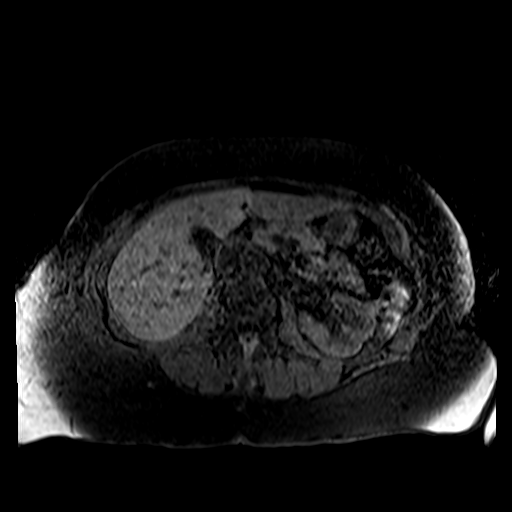
[im 104/104]
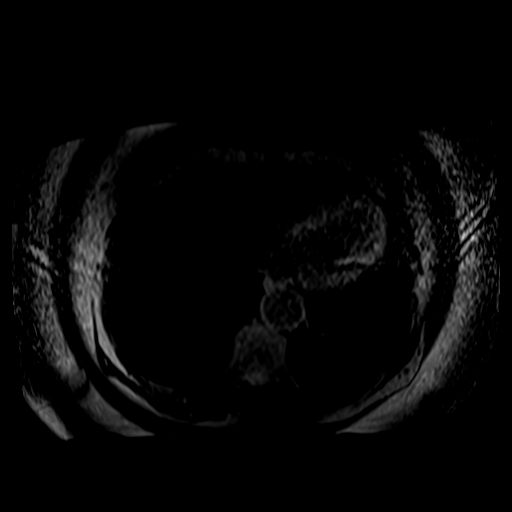

[Series 11: post 30 sec · axial · 2.0mm · 0.78mm/px · z∈[-110,+96]mm · 3 of 104 slices shown]
[im 1/104]
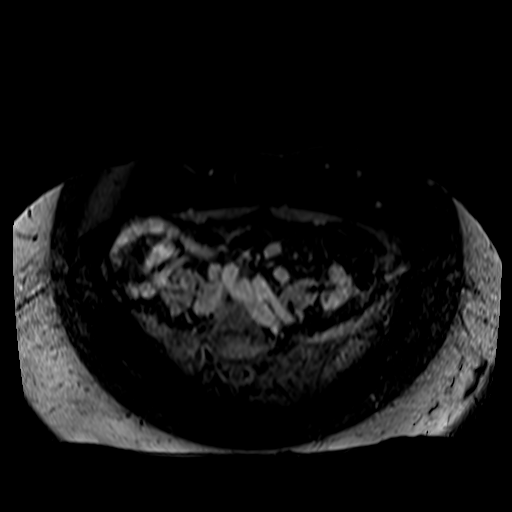
[im 52/104]
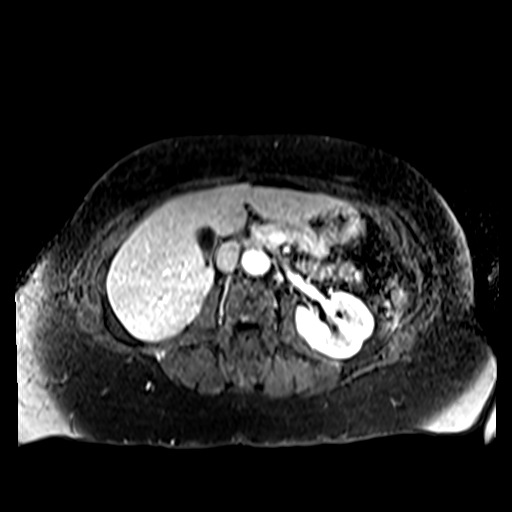
[im 104/104]
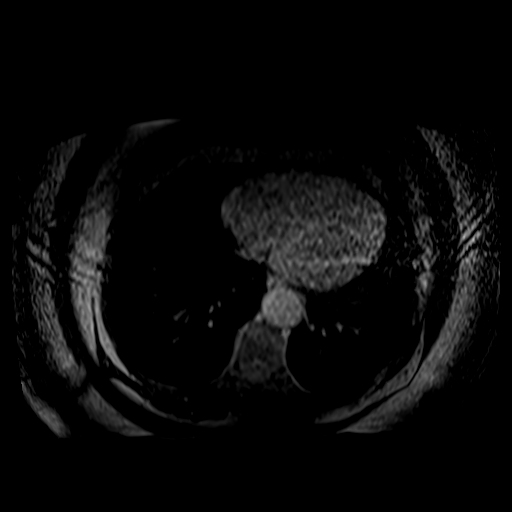

[Series 12: post 30 sec_sub · axial · 2.0mm · 0.78mm/px · z∈[-110,+96]mm · 3 of 104 slices shown]
[im 1/104]
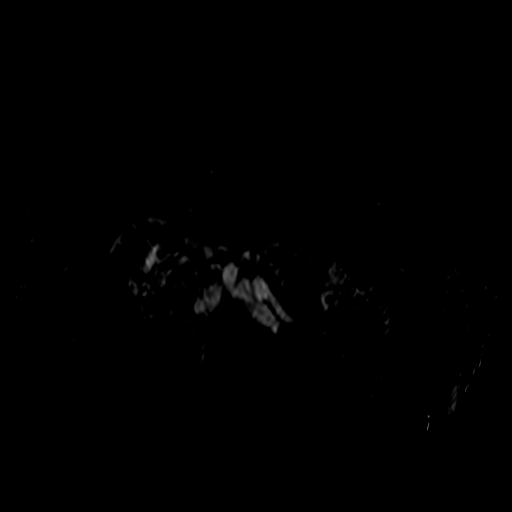
[im 52/104]
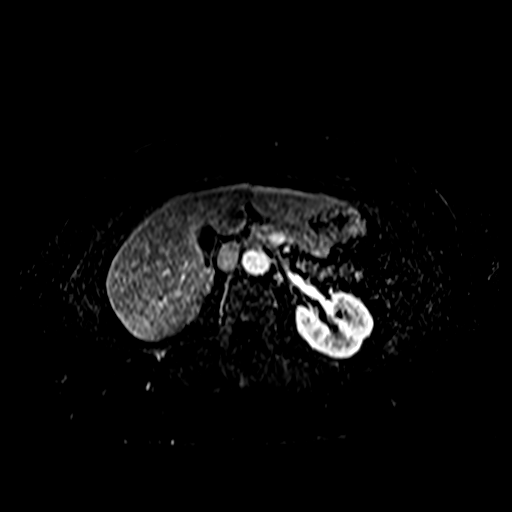
[im 104/104]
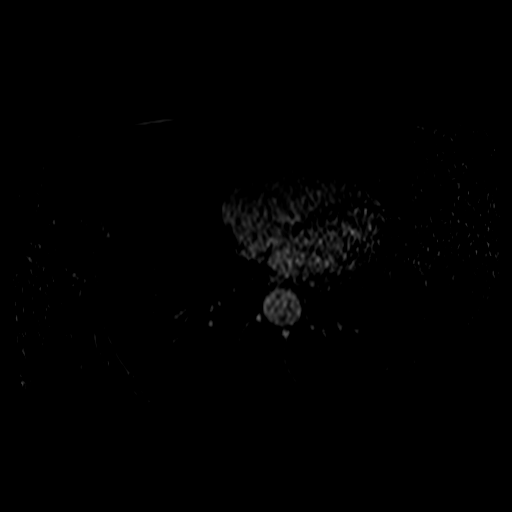

[Series 13: post 60 sec · axial · 2.0mm · 0.78mm/px · z∈[-110,-8]mm · 2 of 104 slices shown]
[im 1/104]
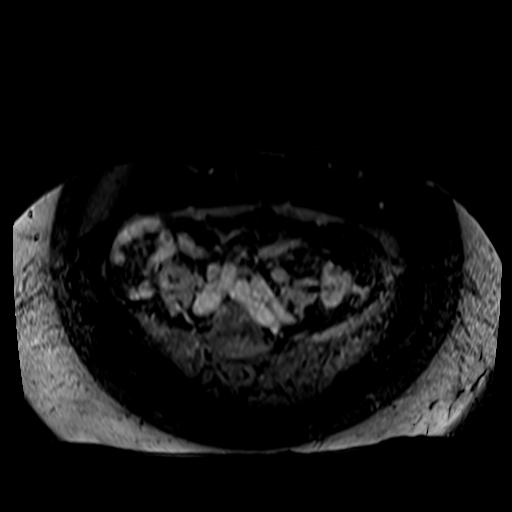
[im 52/104]
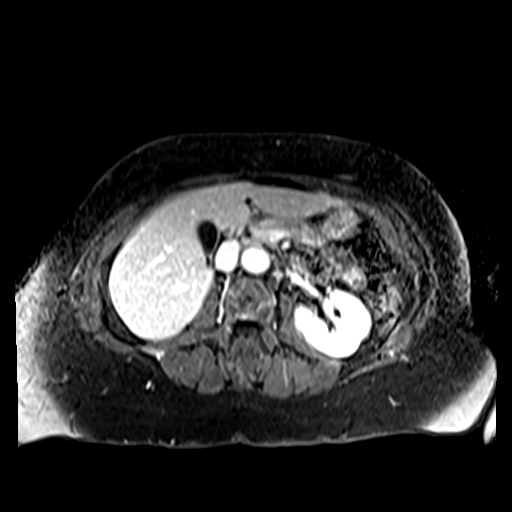

[29 of 48 positions shown; findings below may reference images not displayed]

FINDINGS: Mild degradation secondary to minimal motion and patient body
habitus.

Lower chest: Cardiomegaly, without pericardial or pleural effusion.

Hepatobiliary: High right hepatic lobe subcentimeter cyst. Normal
gallbladder, without biliary ductal dilatation.

Pancreas:  Normal, without mass or ductal dilatation.

Spleen:  Normal in size, without focal abnormality.

Adrenals/Urinary Tract:  Normal adrenal glands.

Status post right nephrectomy, without local recurrence.

A 6 mm interpolar left renal focus of T1 hyperintensity on 44/10 is
without contrast enhancement including on subtracted image 44/14.
Similar on the prior exam and favored to represent a tiny
hemorrhagic/proteinaceous cyst.

The 2 posterior interpolar hypoenhancing left renal lesions are most
apparent on portal venous and delayed images. Example medially at
1.0 cm on 61/15 and more laterally at 1.0 cm on 63/15.

No hydronephrosis.

Stomach/Bowel: Normal stomach and abdominal bowel loops.

Vascular/Lymphatic: Aortic atherosclerosis. No retroperitoneal or
retrocrural adenopathy.

Other:  No ascites.

Musculoskeletal: No acute osseous abnormality.
IMPRESSION: 1. Mild motion and patient body habitus degradation.
2. Similar size of 2 interpolar left renal hypoenhancing lesions
which are suspicious for renal cell carcinomas.
3. Right nephrectomy, without recurrent or metastatic disease.
4.  Aortic Atherosclerosis ([XN]-[XN]).

## 2022-03-04 MED ORDER — GADOBENATE DIMEGLUMINE 529 MG/ML IV SOLN
17.0000 mL | Freq: Once | INTRAVENOUS | Status: AC | PRN
  Administered 2022-03-04: 17 mL via INTRAVENOUS

## 2022-03-05 DIAGNOSIS — Z6838 Body mass index (BMI) 38.0-38.9, adult: Secondary | ICD-10-CM | POA: Diagnosis not present

## 2022-03-05 DIAGNOSIS — H9190 Unspecified hearing loss, unspecified ear: Secondary | ICD-10-CM | POA: Diagnosis not present

## 2022-03-05 DIAGNOSIS — H9319 Tinnitus, unspecified ear: Secondary | ICD-10-CM | POA: Diagnosis not present

## 2022-03-09 ENCOUNTER — Encounter: Payer: Self-pay | Admitting: Endocrinology

## 2022-03-09 ENCOUNTER — Ambulatory Visit (INDEPENDENT_AMBULATORY_CARE_PROVIDER_SITE_OTHER): Payer: Medicare Other | Admitting: Endocrinology

## 2022-03-09 VITALS — BP 144/78 | HR 67 | Ht 62.0 in | Wt 180.6 lb

## 2022-03-09 DIAGNOSIS — I10 Essential (primary) hypertension: Secondary | ICD-10-CM

## 2022-03-09 DIAGNOSIS — E0590 Thyrotoxicosis, unspecified without thyrotoxic crisis or storm: Secondary | ICD-10-CM

## 2022-03-09 LAB — T4, FREE: Free T4: 2.69 ng/dL — ABNORMAL HIGH (ref 0.60–1.60)

## 2022-03-09 LAB — BASIC METABOLIC PANEL
BUN: 20 mg/dL (ref 6–23)
CO2: 27 mEq/L (ref 19–32)
Calcium: 9.4 mg/dL (ref 8.4–10.5)
Chloride: 105 mEq/L (ref 96–112)
Creatinine, Ser: 1.07 mg/dL (ref 0.40–1.20)
GFR: 53.38 mL/min — ABNORMAL LOW (ref >60.00)
Glucose, Bld: 116 mg/dL — ABNORMAL HIGH (ref 70–99)
Potassium: 4.8 mEq/L (ref 3.5–5.1)
Sodium: 138 mEq/L (ref 135–145)

## 2022-03-09 LAB — T3, FREE: T3, Free: 6.1 pg/mL — ABNORMAL HIGH (ref 2.3–4.2)

## 2022-03-09 LAB — TSH: TSH: <0.01 u[IU]/mL — ABNORMAL LOW (ref 0.35–5.50)

## 2022-03-09 NOTE — Progress Notes (Signed)
Patient ID: Christina [name], female   DOB: Oct 29, 1950, 71 y.o.   MRN: [id number]                                                                                                               Reason for Appointment:  Hyperthyroidism, follow-up visit   Chief complaint: Follow-up of thyroid   History of Present Illness:   Prior history: For the last several years the patient has had symptoms of occasional palpitations, shortness of breath on exertion, shakiness and feeling somewhat warm.  However she did not have any recent worsening of the symptoms except on the morning of hospitalization She previously did not have any unusual weight loss, anxiety or fatigue She was also having some swelling of her legs prior to admission Since she was having difficulties with tachycardia and atrial fibrillation her thyroid level was checked in the hospital and indicated hyperthyroidism  RECENT HISTORY:  Baseline symptoms before treatment were occasional palpitations, shortness of breath on exertion, shakiness and feeling warm She has Graves' disease confirmed with a baseline thyrotropin receptor antibody level of 3.9  She has been treated with methimazole since 05/2019  Her dose has fluctuated between 5 and 12.5 mg daily  She periodic adjustments of her dose   She is taking 7.5 mg in the morning and the extra 5 mg was dropped in 4/17 when her free T4 was low  Recently she has less complaints of less tired No heat intolerance or palpitations ? Shaky in am  She has been regular with her methimazole regimen  Thyroid levels pending  Last thyrotropin receptor antibody in 1/23 was markedly increased    Wt Readings from Last 3 Encounters:  03/09/22 180 lb 9.6 oz (81.9 kg)  02/06/22 187 lb (84.8 kg)  01/27/22 187 lb (84.8 kg)   Thyroid function tests as follows:     Lab Results  Component Value Date   FREET4 0.55 (L) 01/05/2022   FREET4 0.85 09/29/2021   FREET4 0.94 07/28/2021   T3FREE 2.9  01/05/2022   T3FREE 3.8 07/28/2021   T3FREE 3.9 06/12/2021   TSH 0.01 (L) 01/05/2022   TSH 0.00 (L) 09/29/2021   TSH <0.01 (L) 07/28/2021    Lab Results  Component Value Date   THYROTRECAB 149.00 (H) 09/29/2021   THYROTRECAB 26.80 (H) 05/01/2021   THYROTRECAB 11.20 (H) 09/26/2020   OPHTHALMOPATHY:  Since about 11/20 she was having more swelling of her eyes, some double vision, and a pressure sensation Subsequently in December she had more fuzzy vision, feeling of fullness in her eyes and continued double vision She went to the urgent care center and was given a Medrol Dosepak  With the recurrence of the symptoms in 1/21 she was again started on tapering dose of prednisone Has been treated by oculoplastic ophthalmologist and she had been on Medrol high-dose weekly injections started in late February with only mild and short lasting relief  She had a complete the course of Tepezza  infusions  This improved her vision and  has reduced the swelling and fullness in her eyes  She had another round of infusions this year but had to stop after the fourth 1 because of tinnitus, last infusion was 6/9 Her oculoplastic surgeon is asking about checking her glucose  Previously had complaints of her eyes looking glassy as well as feeling of the eyes bulging more  Her eyes are feeling much better now   She uses refresh drops for dryness  CT scan of orbits has not shown any proptosis and only thickening of the eye muscles which has improved    Allergies as of 03/09/2022       Reactions   Hydrochlorothiazide Other (See Comments), Hypertension   Wildly fluctuates B/P low-to-high, then high-to-low, in a matter of minutes   Hydrocodone-acetaminophen Nausea And Vomiting   Patient DOES NOT wish to take EVER again...   Other Nausea And Vomiting, Other (See Comments)   Stronger pain meds (post-op) caused dehydration and constipation        Medication List        Accurate as of March 09, 2022  2:11 PM. If you have any questions, ask your nurse or doctor.          acetaminophen 650 MG CR tablet Commonly known as: TYLENOL Take 1,300 mg by mouth in the morning. Take 2 tablets (1300 mg) by mouth scheduled every morning & may take an additional dose (1300 mg) in the evening/afternoon if needed for pain.   adapalene 0.1 % gel Commonly known as: DIFFERIN Apply 1 application topically at bedtime.   amlodipine-benazepril 2.5-10 MG capsule Commonly known as: LOTREL Take 1 capsule by mouth daily.   amoxicillin 500 MG capsule Commonly known as: AMOXIL SMARTSIG:4 Capsule(s) By Mouth Once   carboxymethylcellulose 0.5 % Soln Commonly known as: REFRESH PLUS Place 1-2 drops into both eyes 3 (three) times daily as needed (dry/irritated eyes.).   Dapsone 5 % topical gel Apply 1 application topically in the morning. Apply to the face daily as directed   fluticasone 50 MCG/ACT nasal spray Commonly known as: Mayflower into both nostrils as needed for allergies or rhinitis.   furosemide 20 MG tablet Commonly known as: LASIX Take 1 tablet (20 mg total) by mouth daily.   Lidocaine-Menthol Roll-On 4-1 % Liqd Generic drug: Lidocaine-Menthol Apply 1 application topically 3 (three) times daily as needed (leg pain).   loratadine 10 MG tablet Commonly known as: CLARITIN Take 10 mg by mouth in the morning.   methimazole 5 MG tablet Commonly known as: TAPAZOLE TAKE 1 & 1/2 (ONE & ONE-HALF) TABLETS BY MOUTH IN THE MORNING AND 1 IN THE EVENING   metoprolol tartrate 25 MG tablet Commonly known as: LOPRESSOR Take 1 tablet by mouth as needed for palpitations   sulfamethoxazole-trimethoprim 400-80 MG tablet Commonly known as: BACTRIM Take 1 tablet by mouth daily.            Past Medical History:  Diagnosis Date   Acquired dilation of ascending aorta and aortic root (HCC)    4cm upper normal on Chest CT 09/2020   Arthritis    PAIN AND OA RIGHT KNEE; S/P LEFT TOTAL KNEE  REPLACEMENT 05-07-14 - STILL IN PHYSICAL THERAPY   Asthma    hx of - PT STATES MILD - NO LONGER REQUIRES INHALERS   Cancer ([place])    renal cell carcinoma   CHF (congestive heart failure) (HCC)    Colitis    mild    GERD (gastroesophageal reflux disease)  H/O hiatal hernia    Hypertension    hx of 10 years ago ; PT'S BLOOD PRESSURE RECENTLY ELEVATED WHILE IN HOSP FOR SURG- NOT ON ANY B/P MEDS   Hyperthyroidism    PAF (paroxysmal atrial fibrillation) ([place])    Pulmonary hypertension (HCC)    PASP 45 mmHg by echo 01/2022    Past Surgical History:  Procedure Laterality Date   IR RADIOLOGIST EVAL & MGMT  08/28/2021   KNEE CLOSED REDUCTION Left 06/05/2014   Procedure: CLOSED MANIPULATION KNEE;  Surgeon: Mauri Pole, MD;  Location: [facility];  Service: Orthopedics;  Laterality: Left;   RIGHT/LEFT HEART CATH AND CORONARY ANGIOGRAPHY N/A 05/31/2019   Procedure: RIGHT/LEFT HEART CATH AND CORONARY ANGIOGRAPHY;  Surgeon: Martinique, Peter M, MD;  Location: [place] [facility];  Service: Cardiovascular;  Laterality: N/A;   ROBOTIC ASSITED PARTIAL NEPHRECTOMY Right 03/07/2021   Procedure: XI ROBOTIC ASSITED RADICAL NEPHRECTOMY;  Surgeon: Ceasar Mons, MD;  Location: [facility];  Service: Urology;  Laterality: Right;   TOTAL KNEE ARTHROPLASTY Left 05/07/2014   Procedure: LEFT TOTAL KNEE ARTHROPLASTY;  Surgeon: Mauri Pole, MD;  Location: [facility];  Service: Orthopedics;  Laterality: Left;   TOTAL KNEE ARTHROPLASTY Right 06/05/2014   Procedure: RIGHT TOTAL KNEE ARTHROPLASTY;  Surgeon: Mauri Pole, MD;  Location: [facility];  Service: Orthopedics;  Laterality: Right;   WISDOM TEETH EXTRACTIONS      Family History  Problem Relation Age of Onset   Hypothyroidism Mother    Berenice Primas' disease Sister    Diabetes Brother    Diabetes Maternal Aunt     Social History:  reports that she has never smoked. She has never used smokeless tobacco. She reports that she does not drink alcohol and does not use  drugs.  Allergies:  Allergies  Allergen Reactions   Hydrochlorothiazide Other (See Comments) and Hypertension    Wildly fluctuates B/P low-to-high, then high-to-low, in a matter of minutes   Hydrocodone-Acetaminophen Nausea And Vomiting    Patient DOES NOT wish to take EVER again...   Other Nausea And Vomiting and Other (See Comments)    Stronger pain meds (post-op) caused dehydration and constipation     Review of Systems  Essential hypertension: Treated by cardiologist with Lotrel   BP Readings from Last 3 Encounters:  03/09/22 (!) 144/78  02/27/22 134/65  02/06/22 138/73       Examination:   BP (!) 144/78   Pulse 67   Ht '5\' 2"'$  (1.575 m)   Wt 180 lb 9.6 oz (81.9 kg)   SpO2 99%   BMI 33.03 kg/m   No swelling no prominence of the eyes bilaterally   Assessment/Plan:   Hyperthyroidism, from Graves' disease with baseline thyrotropin receptor antibody of 3.9  She has been on methimazole since early September 2020   She has had frequent adjustments of her methimazole dose although not requiring relatively higher doses than before Currently taking 7.5 in the evening  She has lost some weight for unknown reason Has recent decreased appetite but no symptoms suggestive of hypo or hyperthyroidism Not complaining of any fatigue  Thyroid levels to be checked today  She has previously refused to consider thyroidectomy as definitive treatment  Ophthalmopathy: Improved with second round of Kathrin Greathouse 03/09/2022, 2:11 PM    Note: This office note was prepared with Dragon voice recognition system technology. Any transcriptional errors that result from this process are unintentional.

## 2022-03-10 ENCOUNTER — Encounter: Payer: Self-pay | Admitting: *Deleted

## 2022-03-10 ENCOUNTER — Ambulatory Visit
Admission: RE | Admit: 2022-03-10 | Discharge: 2022-03-10 | Disposition: A | Discharge status: Home / Self Care | Payer: Medicare Other | Source: Ambulatory Visit | Attending: Interventional Radiology | Admitting: Interventional Radiology

## 2022-03-10 DIAGNOSIS — N2889 Other specified disorders of kidney and ureter: Secondary | ICD-10-CM

## 2022-03-10 HISTORY — PX: IR RADIOLOGIST EVAL & MGMT: IMG5224

## 2022-03-10 NOTE — Progress Notes (Signed)
Thyroid level is back up significantly.  Will need to increase her methimazole to 1-1/2 tablets twice a day, blood sugar okay Need to move up her follow-up to end of July

## 2022-03-10 NOTE — Progress Notes (Signed)
Chief Complaint: Patient was consulted remotely today (TeleHealth) for follow-up of left renal lesions.  History of Present Illness: Christina [name] is a 71 y.o. 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 At the time of detection of the right renal carcinoma, 2 small left renal lesions were also detected initially by CT emanating from the mid to lower pole with a partially exophytic lateral cortical lesion demonstrating partially cystic characteristics by unenhanced imaging and demonstrating probable partial internal enhancement with maximal diameter of approximately 1.2 cm by CT and an additional posterior cortical lesion which is endophytic and partially cystic demonstrating probable internal enhancement and measuring approximately 1.3 cm in greatest diameter by CT.  Both of these lesions were subsequently followed by MRI of the abdomen on 07/25/2021 and demonstrated no growth with more accurate diameter estimate of approximately 10 mm each and demonstrating partial internal enhancement.  As the small left renal cortical lesions demonstrated no growth in a 92-monthinterval between CT and MRI studies, decision was made at the time of prior consultation to perform interval surveillance imaging.  A follow-up MRI was performed on 03/04/2022.  She remains asymptomatic with respect to the kidney.  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    Past Medical History:  Diagnosis Date   Acquired dilation of ascending aorta and aortic root ([facility]    4cm upper normal on Chest CT 09/2020   Arthritis    PAIN AND OA RIGHT KNEE; S/P LEFT TOTAL KNEE REPLACEMENT 05-07-14 - STILL IN PHYSICAL THERAPY   Asthma    hx of - PT STATES MILD - NO LONGER REQUIRES INHALERS   [place]    renal cell carcinoma   CHF (congestive heart failure) (HCC)    Colitis    mild    GERD (gastroesophageal  reflux disease)    H/O hiatal hernia    Hypertension    hx of 10 years ago ; PT'S BLOOD PRESSURE RECENTLY ELEVATED WHILE IN HOSP FOR SURG- NOT ON ANY B/P MEDS   Hyperthyroidism    PAF (paroxysmal atrial fibrillation) (HCC)    Pulmonary hypertension (HCC)    PASP 45 mmHg by echo 01/2022    Past Surgical History:  Procedure Laterality Date   IR RADIOLOGIST EVAL & MGMT  08/28/2021   KNEE CLOSED REDUCTION Left 06/05/2014   Procedure: CLOSED MANIPULATION KNEE;  Surgeon: MMauri Pole MD;  Location: [facility];  Service: Orthopedics;  Laterality: Left;   RIGHT/LEFT HEART CATH AND CORONARY ANGIOGRAPHY N/A 05/31/2019   Procedure: RIGHT/LEFT HEART CATH AND CORONARY ANGIOGRAPHY;  Surgeon: JMartinique Peter M, MD;  [facility];  Service: Cardiovascular;  Laterality: N/A;   ROBOTIC ASSITED PARTIAL NEPHRECTOMY Right 03/07/2021   Procedure: XI ROBOTIC ASSITED RADICAL NEPHRECTOMY;  Surgeon: WCeasar Mons MD;  Location: [facility];  Service: Urology;  Laterality: Right;   TOTAL KNEE ARTHROPLASTY Left 05/07/2014   Procedure: LEFT TOTAL KNEE ARTHROPLASTY;  Surgeon: MMauri Pole MD;  [facility]: [facility];  Service: Orthopedics;  Laterality: Left;   TOTAL KNEE ARTHROPLASTY Right 06/05/2014   Procedure: RIGHT TOTAL KNEE ARTHROPLASTY;  Surgeon: MMauri Pole MD;  [facility]: [facility];  Service: Orthopedics;  Laterality: Right;   WISDOM TEETH EXTRACTIONS      Allergies: Hydrochlorothiazide, Hydrocodone-acetaminophen, and Other  Medications: Prior to Admission medications   Medication Sig Start Date End Date  Taking? Authorizing Provider  acetaminophen (TYLENOL) 650 MG CR tablet Take 1,300 mg by mouth in the morning. Take 2 tablets (1300 mg) by mouth scheduled every morning & may take an additional dose (1300 mg) in the evening/afternoon if needed for pain.    [provider]  adapalene (DIFFERIN) 0.1 % gel Apply 1 application topically at bedtime.    [provider]   amlodipine-benazepril (LOTREL) 2.5-10 MG capsule Take 1 capsule by mouth daily. 01/27/22   Sueanne Margarita, MD  amoxicillin (AMOXIL) 500 MG capsule SMARTSIG:4 Capsule(s) By Mouth Once 04/03/21   [provider]  carboxymethylcellulose (REFRESH PLUS) 0.5 % SOLN Place 1-2 drops into both eyes 3 (three) times daily as needed (dry/irritated eyes.).    [provider]  Dapsone 5 % topical gel Apply 1 application topically in the morning. Apply to the face daily as directed    [provider]  fluticasone (FLONASE) 50 MCG/ACT nasal spray Place into both nostrils as needed for allergies or rhinitis.    [provider]  furosemide (LASIX) 20 MG tablet Take 1 tablet (20 mg total) by mouth daily. 01/27/22   Turner, Eber Hong, MD  Lidocaine-Menthol (LIDOCAINE-MENTHOL ROLL-ON) 4-1 % LIQD Apply 1 application topically 3 (three) times daily as needed (leg pain).    [provider]  loratadine (CLARITIN) 10 MG tablet Take 10 mg by mouth in the morning.    [provider]  methimazole (TAPAZOLE) 5 MG tablet TAKE 1 & 1/2 (ONE & ONE-HALF) TABLETS BY MOUTH IN THE MORNING AND 1 IN THE EVENING 10/21/21   Elayne Snare, MD  metoprolol tartrate (LOPRESSOR) 25 MG tablet Take 1 tablet by mouth as needed for palpitations 12/03/21   Sueanne Margarita, MD  sulfamethoxazole-trimethoprim (BACTRIM) 400-80 MG tablet Take 1 tablet by mouth daily. Patient not taking: Reported on 03/09/2022    [provider]     Family History  Problem Relation Age of Onset   Hypothyroidism Mother    Berenice Primas' disease Sister    Diabetes Brother    Diabetes Maternal Aunt     Social History   Socioeconomic History   Marital status: Single    Spouse name: Not on file   Number of children: Not on file   Years of education: Not on file   Highest education level: Not on file  Occupational History   Not on file  Tobacco Use   Smoking status: Never   Smokeless tobacco: Never  Vaping Use    Vaping Use: Never used  Substance and Sexual Activity   Alcohol use: No   Drug use: No   Sexual activity: Not Currently  Other Topics Concern   Not on file  Social History Narrative   Not on file   Social Determinants of Health   Financial Resource Strain: Not on file  Food Insecurity: Not on file  Transportation Needs: Not on file  Physical Activity: Not on file  Stress: Not on file  Social Connections: Not on file     Review of Systems  Constitutional: Negative.   Respiratory: Negative.    Cardiovascular: Negative.   Gastrointestinal: Negative.   Genitourinary: Negative.   Musculoskeletal: Negative.   Neurological: Negative.     Review of Systems: A 12 point ROS discussed and pertinent positives are indicated in the HPI above.  All other systems are negative.   Physical Exam No direct physical exam was performed (except for noted visual exam findings with Video Visits).  Vital Signs: There were no vitals taken for this visit.  Imaging: MR ABDOMEN WWO CONTRAST  Result Date: 03/05/2022 CLINICAL DATA:  Status post right nephrectomy for renal cell carcinoma. Asymptomatic. EXAM: MRI ABDOMEN WITHOUT AND WITH CONTRAST TECHNIQUE: Multiplanar multisequence MR imaging of the abdomen was performed both before and after the administration of intravenous contrast. CONTRAST:  74m MULTIHANCE GADOBENATE DIMEGLUMINE 529 MG/ML IV SOLN COMPARISON:  07/25/2021 FINDINGS: Mild degradation secondary to minimal motion and patient body habitus. Lower chest: Cardiomegaly, without pericardial or pleural effusion. Hepatobiliary: High right hepatic lobe subcentimeter cyst. Normal gallbladder, without biliary ductal dilatation. Pancreas:  Normal, without mass or ductal dilatation. Spleen:  Normal in size, without focal abnormality. Adrenals/Urinary Tract:  Normal adrenal glands. Status post right nephrectomy, without local recurrence. A 6 mm interpolar left renal focus of T1 hyperintensity on 44/10  is without contrast enhancement including on subtracted image 44/14. Similar on the prior exam and favored to represent a tiny hemorrhagic/proteinaceous cyst. The 2 posterior interpolar hypoenhancing left renal lesions are most apparent on portal venous and delayed images. Example medially at 1.0 cm on 61/15 and more laterally at 1.0 cm on 63/15. No hydronephrosis. Stomach/Bowel: Normal stomach and abdominal bowel loops. Vascular/Lymphatic: Aortic atherosclerosis. No retroperitoneal or retrocrural adenopathy. Other:  No ascites. Musculoskeletal: No acute osseous abnormality. IMPRESSION: 1. Mild motion and patient body habitus degradation. 2. Similar size of 2 interpolar left renal hypoenhancing lesions which are suspicious for renal cell carcinomas. 3. Right nephrectomy, without recurrent or metastatic disease. 4.  Aortic Atherosclerosis (ICD10-I70.0). Electronically Signed   By: KAbigail MiyamotoM.D.   On: 03/05/2022 16:38    Labs:  CBC: Recent Labs    06/12/21 1452 09/29/21 1506  WBC 5.2 5.6  HGB 12.5 12.9  HCT 37.7 39.3  PLT 293.0 300.0    COAGS: No results for input(s): "INR", "APTT" in the last 8760 hours.  BMP: Recent Labs    03/09/22 1443  NA 138  K 4.8  CL 105  CO2 27  GLUCOSE 116*  BUN 20  CALCIUM 9.4  CREATININE 1.07    LIVER FUNCTION TESTS: Recent Labs    06/12/21 1452 09/29/21 1506  ALT 8 10     Assessment and Plan:  I spoke with Mr. YEakinsover the phone.  We reviewed w findings from the follow-up MRI dated 03/04/2022.  This demonstrate stability of the small interpolar cortical lesions of the left kidney that each measure approximately 10 mm in diameter.  These are now stable by imaging since February, 2022.  I recommended additional surveillance with follow-up MRI in 1 year.  She is comfortable with this plan.    Electronically Signed: GAzzie Roup6/20/2023, 3:20 PM    I spent a total of 10 Minutes in remote  clinical consultation, greater than 50%  of which was counseling/coordinating care for left renal masses.    Visit type: Audio only (telephone). Audio (no video) only due to patient's lack of internet/smartphone capability. [facility] [place] This visit type was conducted due to national recommendations for restrictions regarding the COVID-19 Pandemic (e.g. social distancing).  This format is felt to be most appropriate for this patient at this time.  All issues noted in this document were discussed and addressed.

## 2022-03-20 ENCOUNTER — Encounter (HOSPITAL_COMMUNITY): Payer: Medicare Other

## 2022-04-01 ENCOUNTER — Other Ambulatory Visit: Payer: Self-pay | Admitting: Endocrinology

## 2022-04-01 DIAGNOSIS — E0590 Thyrotoxicosis, unspecified without thyrotoxic crisis or storm: Secondary | ICD-10-CM

## 2022-04-20 ENCOUNTER — Encounter: Payer: Self-pay | Admitting: Endocrinology

## 2022-04-20 ENCOUNTER — Ambulatory Visit (INDEPENDENT_AMBULATORY_CARE_PROVIDER_SITE_OTHER): Payer: Medicare Other | Admitting: Endocrinology

## 2022-04-20 VITALS — BP 124/64 | HR 72 | Ht 62.0 in | Wt 181.2 lb

## 2022-04-20 DIAGNOSIS — E0590 Thyrotoxicosis, unspecified without thyrotoxic crisis or storm: Secondary | ICD-10-CM | POA: Diagnosis not present

## 2022-04-20 DIAGNOSIS — R634 Abnormal weight loss: Secondary | ICD-10-CM

## 2022-04-20 MED ORDER — METHIMAZOLE 5 MG PO TABS: ORAL_TABLET | ORAL | 2 refills | Status: DC

## 2022-04-20 NOTE — Progress Notes (Signed)
Patient ID: Christina [name], female   DOB: [birth year], 71 y.o.   MRN: [id number]                                                                                                               Reason for Appointment:  Hyperthyroidism, follow-up visit   Chief complaint: Follow-up of thyroid   History of Present Illness:   Prior history: For the last several years the patient has had symptoms of occasional palpitations, shortness of breath on exertion, shakiness and feeling somewhat warm.  However she did not have any recent worsening of the symptoms except on the morning of hospitalization She previously did not have any unusual weight loss, anxiety or fatigue She was also having some swelling of her legs prior to admission Since she was having difficulties with tachycardia and atrial fibrillation her thyroid level was checked in the hospital and indicated hyperthyroidism  RECENT HISTORY:  Baseline symptoms before treatment were occasional palpitations, shortness of breath on exertion, shakiness and feeling warm She has Graves' disease confirmed with a baseline thyrotropin receptor antibody level of 3.9  She has been treated with methimazole since 05/2019  Her dose has fluctuated between 5 and 12.5 mg daily  She periodic adjustments of her dose   She is taking 7.5 mg in the morning twice a day since her last visit in 6/23  She does complain of feeling somewhat tired now Also has somewhat decreased appetite but her weight has leveled off Prior to her last visit she had lost weight and may have had some shakiness at times which is improved; she was found to have a significant increase in free T4 of 2.7 With this her methimazole dose was doubled  She has been regular with her methimazole regimen  Thyroid levels pending  Last thyrotropin receptor antibody in 1/23 was markedly increased    Wt Readings from Last 3 Encounters:  04/20/22 181 lb 3.2 oz (82.2 kg)  03/09/22 180 lb 9.6 oz  (81.9 kg)  02/06/22 187 lb (84.8 kg)   Thyroid function tests as follows:     Lab Results  Component Value Date   FREET4 2.69 (H) 03/09/2022   FREET4 0.55 (L) 01/05/2022   FREET4 0.85 09/29/2021   T3FREE 6.1 (H) 03/09/2022   T3FREE 2.9 01/05/2022   T3FREE 3.8 07/28/2021   TSH <0.01 (L) 03/09/2022   TSH 0.01 (L) 01/05/2022   TSH 0.00 (L) 09/29/2021    Lab Results  Component Value Date   THYROTRECAB 149.00 (H) 09/29/2021   THYROTRECAB 26.80 (H) 05/01/2021   THYROTRECAB 11.20 (H) 09/26/2020   OPHTHALMOPATHY:  Since about 11/20 she was having more swelling of her eyes, some double vision, and a pressure sensation Subsequently in December she had more fuzzy vision, feeling of fullness in her eyes and continued double vision She went to the urgent care center and was given a Medrol Dosepak  With the recurrence of the symptoms in 1/21 she was again started on tapering dose of prednisone Has been treated  by oculoplastic ophthalmologist and she had been on Medrol high-dose weekly injections started in late February with only mild and short lasting relief  She had a complete the course of Tepezza  infusions  This improved her vision and has reduced the swelling and fullness in her eyes  She had another round of infusions this year but had to stop after the fourth 1 because of tinnitus, last infusion was 6/9 Her oculoplastic surgeon is asking about checking her glucose  Previously had complaints of her eyes looking glassy as well as feeling of the eyes bulging more  Her eyes are feeling much better now   She uses refresh drops for dryness  CT scan of orbits has not shown any proptosis and only thickening of the eye muscles which has improved    Allergies as of 04/20/2022       Reactions   Hydrochlorothiazide Other (See Comments), Hypertension   Wildly fluctuates B/P low-to-high, then high-to-low, in a matter of minutes   Hydrocodone-acetaminophen Nausea And Vomiting    Patient DOES NOT wish to take EVER again...   Other Nausea And Vomiting, Other (See Comments)   Stronger pain meds (post-op) caused dehydration and constipation        Medication List        Accurate as of April 20, 2022  4:46 PM. If you have any questions, ask your nurse or doctor.          acetaminophen 650 MG CR tablet Commonly known as: TYLENOL Take 1,300 mg by mouth in the morning. Take 2 tablets (1300 mg) by mouth scheduled every morning & may take an additional dose (1300 mg) in the evening/afternoon if needed for pain.   adapalene 0.1 % gel Commonly known as: DIFFERIN Apply 1 application topically at bedtime.   amlodipine-benazepril 2.5-10 MG capsule Commonly known as: LOTREL Take 1 capsule by mouth daily.   amoxicillin 500 MG capsule Commonly known as: AMOXIL SMARTSIG:4 Capsule(s) By Mouth Once   carboxymethylcellulose 0.5 % Soln Commonly known as: REFRESH PLUS Place 1-2 drops into both eyes 3 (three) times daily as needed (dry/irritated eyes.).   Dapsone 5 % topical gel Apply 1 application topically in the morning. Apply to the face daily as directed   fluticasone 50 MCG/ACT nasal spray Commonly known as: [place] into both nostrils as needed for allergies or rhinitis.   furosemide 20 MG tablet Commonly known as: LASIX Take 1 tablet (20 mg total) by mouth daily.   Lidocaine-Menthol Roll-On 4-1 % Liqd Generic drug: Lidocaine-Menthol Apply 1 application topically 3 (three) times daily as needed (leg pain).   loratadine 10 MG tablet Commonly known as: CLARITIN Take 10 mg by mouth in the morning.   methimazole 5 MG tablet Commonly known as: TAPAZOLE TAKE 1 & 1/2 (ONE & ONE-HALF) TABLETS BY MOUTH IN THE MORNING AND 1 IN THE EVENING What changed: See the new instructions.   metoprolol tartrate 25 MG tablet Commonly known as: LOPRESSOR Take 1 tablet by mouth as needed for palpitations   sulfamethoxazole-trimethoprim 400-80 MG tablet Commonly  known as: BACTRIM Take 1 tablet by mouth daily.            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 - STILL IN PHYSICAL THERAPY   Asthma  hx of - PT STATES MILD - NO LONGER REQUIRES INHALERS   Cancer ([place])    renal cell carcinoma   CHF (congestive heart failure) (HCC)    Colitis    mild    GERD (gastroesophageal reflux disease)    H/O hiatal hernia    Hypertension    hx of 10 years ago ; PT'S BLOOD PRESSURE RECENTLY ELEVATED WHILE IN HOSP FOR SURG- NOT ON ANY B/P MEDS   Hyperthyroidism    PAF (paroxysmal atrial fibrillation) ([place])    Pulmonary hypertension (HCC)    PASP 45 mmHg by echo 01/2022    Past Surgical History:  Procedure Laterality Date   IR RADIOLOGIST EVAL & MGMT  08/28/2021   IR RADIOLOGIST EVAL & MGMT  03/10/2022   KNEE CLOSED REDUCTION Left 06/05/2014   Procedure: CLOSED MANIPULATION KNEE;  Surgeon: Mauri Pole, MD;  [facility]: [facility];  Service: Orthopedics;  Laterality: Left;   RIGHT/LEFT HEART CATH AND CORONARY ANGIOGRAPHY N/A 05/31/2019   Procedure: RIGHT/LEFT HEART CATH AND CORONARY ANGIOGRAPHY;  Surgeon: Martinique, Peter M, MD;  Location: [facility];  Service: Cardiovascular;  Laterality: N/A;   ROBOTIC ASSITED PARTIAL NEPHRECTOMY Right 03/07/2021   Procedure: XI ROBOTIC ASSITED RADICAL NEPHRECTOMY;  Surgeon: Ceasar Mons, MD;  [facility];  Service: Urology;  Laterality: Right;   TOTAL KNEE ARTHROPLASTY Left 05/07/2014   Procedure: LEFT TOTAL KNEE ARTHROPLASTY;  Surgeon: Mauri Pole, MD;  [facility];  Service: Orthopedics;  Laterality: Left;   TOTAL KNEE ARTHROPLASTY Right 06/05/2014   Procedure: RIGHT TOTAL KNEE ARTHROPLASTY;  Surgeon: Mauri Pole, MD;  [facility];  Service: Orthopedics;  Laterality: Right;   WISDOM TEETH EXTRACTIONS       Family History  Problem Relation Age of Onset   Hypothyroidism Mother    Berenice Primas' disease Sister    Diabetes Brother    Diabetes Maternal Aunt     Social History:  reports that she has never smoked. She has never used smokeless tobacco. She reports that she does not drink alcohol and does not use drugs.  Allergies:  Allergies  Allergen Reactions   Hydrochlorothiazide Other (See Comments) and Hypertension    Wildly fluctuates B/P low-to-high, then high-to-low, in a matter of minutes   Hydrocodone-Acetaminophen Nausea And Vomiting    Patient DOES NOT wish to take EVER again...   Other Nausea And Vomiting and Other (See Comments)    Stronger pain meds (post-op) caused dehydration and constipation     Review of Systems  Essential hypertension: Treated by cardiologist with Lotrel   BP Readings from Last 3 Encounters:  04/20/22 124/64  03/09/22 (!) 144/78  02/27/22 134/65       Examination:   BP 124/64   Pulse 72   Ht '5\' 2"'$  (1.575 m)   Wt 181 lb 3.2 oz (82.2 kg)   SpO2 97%   BMI 33.14 kg/m   She has mild swelling but no prominence of the eyes bilaterally Thyroid not palpable Biceps reflexes appear normal No tremor   Assessment/Plan:   Hyperthyroidism, from Graves' disease with baseline thyrotropin receptor antibody of 3.9  She has been on methimazole since early September 2020   She has had frequent adjustments of her methimazole dose although not requiring relatively higher doses than before Currently taking 7.5 twice a day with evidence of hyperthyroidism on her last visit in June  Again difficult to assess her symptoms, she is complaining of feeling somewhat tired Weight is  about the same but previously has lost weight  Thyroid levels to be checked today  She is still reluctant to consider thyroidectomy as definitive treatment  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 Any transcriptional errors that result from this process are unintentional.

## 2022-04-21 LAB — TSH: TSH: 0.01 u[IU]/mL — ABNORMAL LOW (ref 0.35–5.50)

## 2022-04-21 LAB — THYROTROPIN RECEPTOR AUTOABS: Thyrotropin Receptor Ab: 26.6 IU/L — ABNORMAL HIGH (ref 0.00–1.75)

## 2022-04-21 LAB — CBC
HCT: 40.4 % (ref 36.0–46.0)
Hemoglobin: 13.3 g/dL (ref 12.0–15.0)
MCHC: 32.8 g/dL (ref 30.0–36.0)
MCV: 90.9 fl (ref 78.0–100.0)
Platelets: 224 10*3/uL (ref 150.0–400.0)
RBC: 4.44 Mil/uL (ref 3.87–5.11)
RDW: 13.6 % (ref 11.5–15.5)
WBC: 5.5 10*3/uL (ref 4.0–10.5)

## 2022-04-21 LAB — T4, FREE: Free T4: 1.09 ng/dL (ref 0.60–1.60)

## 2022-04-21 LAB — ALT: ALT: 10 U/L (ref 0–35)

## 2022-04-23 DIAGNOSIS — H02531 Eyelid retraction right upper eyelid: Secondary | ICD-10-CM | POA: Diagnosis not present

## 2022-04-23 DIAGNOSIS — E0500 Thyrotoxicosis with diffuse goiter without thyrotoxic crisis or storm: Secondary | ICD-10-CM | POA: Diagnosis not present

## 2022-04-23 DIAGNOSIS — H04123 Dry eye syndrome of bilateral lacrimal glands: Secondary | ICD-10-CM | POA: Diagnosis not present

## 2022-04-23 DIAGNOSIS — H532 Diplopia: Secondary | ICD-10-CM | POA: Diagnosis not present

## 2022-04-23 DIAGNOSIS — H40053 Ocular hypertension, bilateral: Secondary | ICD-10-CM | POA: Diagnosis not present

## 2022-04-23 DIAGNOSIS — H02534 Eyelid retraction left upper eyelid: Secondary | ICD-10-CM | POA: Diagnosis not present

## 2022-04-23 DIAGNOSIS — H05123 Orbital myositis, bilateral: Secondary | ICD-10-CM | POA: Diagnosis not present

## 2022-04-24 ENCOUNTER — Telehealth: Payer: Self-pay

## 2022-04-24 NOTE — Telephone Encounter (Signed)
Patient called reqesting lab results and wants to know if any changes need to be made to medications?

## 2022-05-21 ENCOUNTER — Ambulatory Visit: Payer: Medicare Other | Admitting: Endocrinology

## 2022-06-26 ENCOUNTER — Ambulatory Visit (INDEPENDENT_AMBULATORY_CARE_PROVIDER_SITE_OTHER): Payer: Medicare Other | Admitting: Endocrinology

## 2022-06-26 ENCOUNTER — Encounter: Payer: Self-pay | Admitting: Endocrinology

## 2022-06-26 DIAGNOSIS — E0590 Thyrotoxicosis, unspecified without thyrotoxic crisis or storm: Secondary | ICD-10-CM | POA: Diagnosis not present

## 2022-06-26 LAB — TSH: TSH: 0.22 u[IU]/mL — ABNORMAL LOW (ref 0.35–5.50)

## 2022-06-26 LAB — T4, FREE: Free T4: 0.5 ng/dL — ABNORMAL LOW (ref 0.60–1.60)

## 2022-06-26 MED ORDER — METHIMAZOLE 5 MG PO TABS: ORAL_TABLET | ORAL | 2 refills | Status: DC

## 2022-06-26 NOTE — Progress Notes (Signed)
Patient ID: Christina [name], female   DOB: 03-Dec-1950, 71 y.o.   MRN: [id number]                                                                                                               Reason for Appointment:  Hyperthyroidism, follow-up visit   Chief complaint: Follow-up of thyroid   History of Present Illness:   Prior history: For the last several years the patient has had symptoms of occasional palpitations, shortness of breath on exertion, shakiness and feeling somewhat warm.  However she did not have any recent worsening of the symptoms except on the morning of hospitalization She previously did not have any unusual weight loss, anxiety or fatigue She was also having some swelling of her legs prior to admission Since she was having difficulties with tachycardia and atrial fibrillation her thyroid level was checked in the hospital and indicated hyperthyroidism  RECENT HISTORY:  Baseline symptoms before treatment were occasional palpitations, shortness of breath on exertion, shakiness and feeling warm She has Graves' disease confirmed with a baseline thyrotropin receptor antibody level of 3.9  She has been treated with methimazole since 05/2019  Her dose has fluctuated between 5 and 12.5 mg daily  She periodic adjustments of her dose   She is taking 7.5 mg in the morning and 5 mg in the evening a day since 8/23  She does think she felt a little less tired after her last visit However this is not normal yet Her weight is up 4 pounds No other symptoms related to hypothyroidism hypothyroidism currently She takes her methimazole consistently  She has been regular with her methimazole regimen  Thyroid levels pending  Last thyrotropin receptor antibody in 7/23 was similar to her level a year ago and much better compared to 1/23   Wt Readings from Last 3 Encounters:  06/26/22 185 lb 9.6 oz (84.2 kg)  04/20/22 181 lb 3.2 oz (82.2 kg)  03/09/22 180 lb 9.6 oz (81.9 kg)    Thyroid function tests as follows:     Lab Results  Component Value Date   FREET4 1.09 04/20/2022   FREET4 2.69 (H) 03/09/2022   FREET4 0.55 (L) 01/05/2022   T3FREE 6.1 (H) 03/09/2022   T3FREE 2.9 01/05/2022   T3FREE 3.8 07/28/2021   TSH 0.01 (L) 04/20/2022   TSH <0.01 (L) 03/09/2022   TSH 0.01 (L) 01/05/2022    Lab Results  Component Value Date   THYROTRECAB 26.60 (H) 04/20/2022   THYROTRECAB 149.00 (H) 09/29/2021   THYROTRECAB 26.80 (H) 05/01/2021   OPHTHALMOPATHY:  Since about 11/20 she was having more swelling of her eyes, some double vision, and a pressure sensation Subsequently in December she had more fuzzy vision, feeling of fullness in her eyes and continued double vision She went to the urgent care center and was given a Medrol Dosepak  With the recurrence of the symptoms in 1/21 she was again started on tapering dose of prednisone Has been treated by oculoplastic ophthalmologist and she had been on  Medrol high-dose weekly injections started in late February with only mild and short lasting relief  She had a complete the course of Tepezza  infusions  This improved her vision and has reduced the swelling and fullness in her eyes  She had another round of infusions this year but had to stop after the fourth 1 because of tinnitus, last infusion was 02/27/22  Previously had complaints of her eyes looking glassy as well as feeling of the eyes bulging more  Her eyes are feeling much better now   She uses refresh drops for dryness  CT scan of orbits has not shown any proptosis and only thickening of the eye muscles which has improved    Allergies as of 06/26/2022       Reactions   Hydrochlorothiazide Other (See Comments), Hypertension   Wildly fluctuates B/P low-to-high, then high-to-low, in a matter of minutes   Hydrocodone-acetaminophen Nausea And Vomiting   Patient DOES NOT wish to take EVER again...   Other Nausea And Vomiting, Other (See Comments)    Stronger pain meds (post-op) caused dehydration and constipation        Medication List        Accurate as of June 26, 2022  9:50 AM. If you have any questions, ask your nurse or doctor.          acetaminophen 650 MG CR tablet Commonly known as: TYLENOL Take 1,300 mg by mouth in the morning. Take 2 tablets (1300 mg) by mouth scheduled every morning & may take an additional dose (1300 mg) in the evening/afternoon if needed for pain.   adapalene 0.1 % gel Commonly known as: DIFFERIN Apply 1 application topically at bedtime.   amlodipine-benazepril 2.5-10 MG capsule Commonly known as: LOTREL Take 1 capsule by mouth daily.   amoxicillin 500 MG capsule Commonly known as: AMOXIL SMARTSIG:4 Capsule(s) By Mouth Once   carboxymethylcellulose 0.5 % Soln Commonly known as: REFRESH PLUS Place 1-2 drops into both eyes 3 (three) times daily as needed (dry/irritated eyes.).   Dapsone 5 % topical gel Apply 1 application topically in the morning. Apply to the face daily as directed   fluticasone 50 MCG/ACT nasal spray Commonly known as: [place] into both nostrils as needed for allergies or rhinitis.   furosemide 20 MG tablet Commonly known as: LASIX Take 1 tablet (20 mg total) by mouth daily.   Lidocaine-Menthol Roll-On 4-1 % Liqd Generic drug: Lidocaine-Menthol Apply 1 application topically 3 (three) times daily as needed (leg pain).   loratadine 10 MG tablet Commonly known as: CLARITIN Take 10 mg by mouth in the morning.   methimazole 5 MG tablet Commonly known as: TAPAZOLE TAKE 1 & 1/2 (ONE & ONE-HALF) TABLETS BY MOUTH IN THE MORNING AND 1 & 1/2 IN THE EVENING   metoprolol tartrate 25 MG tablet Commonly known as: LOPRESSOR Take 1 tablet by mouth as needed for palpitations   sulfamethoxazole-trimethoprim 400-80 MG tablet Commonly known as: BACTRIM Take 1 tablet by mouth daily.            Past Medical History:  Diagnosis Date   Acquired dilation of  ascending aorta and aortic root (HCC)    4cm upper normal on Chest CT 09/2020   Arthritis    PAIN AND OA RIGHT KNEE; S/P LEFT TOTAL KNEE REPLACEMENT 05-07-14 - STILL IN PHYSICAL THERAPY   Asthma    hx of - PT STATES MILD - NO LONGER REQUIRES INHALERS   Cancer ([place])    renal  cell carcinoma   CHF (congestive heart failure) (HCC)    Colitis    mild    GERD (gastroesophageal reflux disease)    H/O hiatal hernia    Hypertension    hx of 10 years ago ; PT'S BLOOD PRESSURE RECENTLY ELEVATED WHILE IN HOSP FOR SURG- NOT ON ANY B/P MEDS   Hyperthyroidism    PAF (paroxysmal atrial fibrillation) ([place])    Pulmonary hypertension (HCC)    PASP 45 mmHg by echo 01/2022    Past Surgical History:  Procedure Laterality Date   IR RADIOLOGIST EVAL & MGMT  08/28/2021   IR RADIOLOGIST EVAL & MGMT  03/10/2022   KNEE CLOSED REDUCTION Left 06/05/2014   Procedure: CLOSED MANIPULATION KNEE;  Surgeon: Mauri Pole, MD;  Location: [facility];  Service: Orthopedics;  Laterality: Left;   RIGHT/LEFT HEART CATH AND CORONARY ANGIOGRAPHY N/A 05/31/2019   Procedure: RIGHT/LEFT HEART CATH AND CORONARY ANGIOGRAPHY;  Surgeon: Martinique, Peter M, MD;  Location: [place] [facility];  Service: Cardiovascular;  Laterality: N/A;   ROBOTIC ASSITED PARTIAL NEPHRECTOMY Right 03/07/2021   Procedure: XI ROBOTIC ASSITED RADICAL NEPHRECTOMY;  Surgeon: Ceasar Mons, MD;  Location: [facility];  Service: Urology;  Laterality: Right;   TOTAL KNEE ARTHROPLASTY Left 05/07/2014   Procedure: LEFT TOTAL KNEE ARTHROPLASTY;  Surgeon: Mauri Pole, MD;  Location: [facility];  Service: Orthopedics;  Laterality: Left;   TOTAL KNEE ARTHROPLASTY Right 06/05/2014   Procedure: RIGHT TOTAL KNEE ARTHROPLASTY;  Surgeon: Mauri Pole, MD;  [facility]: [facility];  Service: Orthopedics;  Laterality: Right;   WISDOM TEETH EXTRACTIONS      Family History  Problem Relation Age of Onset   Hypothyroidism Mother    Berenice Primas' disease Sister    Diabetes Brother     Diabetes Maternal Aunt     Social History:  reports that she has never smoked. She has never used smokeless tobacco. She reports that she does not drink alcohol and does not use drugs.  Allergies:  Allergies  Allergen Reactions   Hydrochlorothiazide Other (See Comments) and Hypertension    Wildly fluctuates B/P low-to-high, then high-to-low, in a matter of minutes   Hydrocodone-Acetaminophen Nausea And Vomiting    Patient DOES NOT wish to take EVER again...   Other Nausea And Vomiting and Other (See Comments)    Stronger pain meds (post-op) caused dehydration and constipation     Review of Systems  Essential hypertension: Treated by cardiologist with Lotrel   BP Readings from Last 3 Encounters:  06/26/22 (!) 140/80  04/20/22 124/64  03/09/22 (!) 144/78       Examination:   BP (!) 140/80   Pulse 63   Ht '5\' 2"'$  (1.575 m)   Wt 185 lb 9.6 oz (84.2 kg)   SpO2 99%   BMI 33.95 kg/m   She has mild swelling of the eyes and no proptosis    Assessment/Plan:   Hyperthyroidism, from Graves' disease with baseline thyrotropin receptor antibody of 3.9  She has been on methimazole since early September 2020   She has had frequent adjustments of her methimazole dose although not requiring relatively higher doses than before Currently taking a total of 12.5 mg daily Subjectively doing well but usually does not have significant symptoms with alteration in her thyroid levels  Has gained a little weight  Thyroid levels to be checked today  She is still reluctant to consider thyroidectomy as definitive treatment despite her antibody levels being significantly high  Ophthalmopathy: Improved  and stable  Elayne Snare 06/26/2022, 9:50 AM    Note: This office note was prepared with Dragon voice recognition system technology. Any transcriptional errors that result from this process are unintentional.  Addendum: Message sent as follows  Her thyroid level is significantly low with  free T40.5, instead of taking 2-1/2 tablets total per day she will reduce the dose to 1-1/2 tablets daily and schedule follow-up in early December with labs same day  Elayne Snare

## 2022-07-28 ENCOUNTER — Ambulatory Visit: Payer: Medicare Other | Admitting: Nurse Practitioner

## 2022-08-03 DIAGNOSIS — H2513 Age-related nuclear cataract, bilateral: Secondary | ICD-10-CM | POA: Diagnosis not present

## 2022-08-03 DIAGNOSIS — H40053 Ocular hypertension, bilateral: Secondary | ICD-10-CM | POA: Diagnosis not present

## 2022-08-06 NOTE — Progress Notes (Signed)
Cardiology Office Note:    Date:  08/07/2022   ID:  Christina [name], Christina [name] Nov 19, 1950, MRN [id number]  PCP:  Mateo Flow, MD   [facility] Providers Cardiologist:  Fransico Him, MD     Referring MD: Mateo Flow, MD   Chief Complaint: follow-up HFrEF  History of Present Illness:    Christina [name] is a pleasant 71 y.o. female with a hx of chronic systolic heart failure, PAF, hypertension, hypothyroidism, normal coronary arteries normal right heart pressures on cath 05/2019, chronic LE edema and DOE, dilatation of ascending aorta and aortic root  She presented to ED on 05/31/2019 with new onset shortness of breath.  She was found to have acute on chronic heart failure symptoms, elevated troponin, and new onset atrial fibrillation with RVR.  Echocardiogram at admission showed LVEF 30 to 35%.  She underwent cardiac catheterization on 05/31/2019 with normal coronary anatomy and a low normal to mildly reduced LV function.  Assessment was limited by atrial fibrillation, however EF was visually estimated at 50 to 55%.  Given the discrepancy between echo 9 9 and cath report.  Limited echo performed on 06/04/2019 that showed LVEF 45 to 50%.  She was diagnosed with Graves' disease soon after.  Last cardiology clinic visit was 01/27/2022 with Dr. Radford Pax at which time she reported problems with elevated blood pressures and shortness of breath after getting infusion of drug for thyroid eye disease.  2D echo 01/27/2022 showed LVEF 60 to 65%, G1DD, mild MR, moderate TR, mild dilatation of ascending aorta measuring 42 mm. [place] home sleep study was ordered to rule out sleep apnea due to new finding of pulmonary hypertension with PASP of 45 mmHg on echo. She had normal home sleep study.  Today, she is here alone for follow-up. Reports she is feeling well, slight improvement since last office visit. Wonders if it is because she has been more active recently. Does not exercise on a consistent basis, too tired to exercise  after work. Not interested in joining the [employer].  Does some yard work. Cannot do much, used to work all day without stopping now can only work outside for a few hours. No longer checks BP because it has been well-controlled for a long time.  She denies chest pain, orthopnea, PND.  Gets edema just distal to her knees.  Cannot tolerate elevating her legs. Does all her own cooking, limits sodium.  Has palpitations only when thyroid medication needs to be adjusted.  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 - STILL IN PHYSICAL THERAPY   Asthma    hx of - PT STATES MILD - NO LONGER REQUIRES INHALERS   Cancer ([place])    renal cell carcinoma   CHF (congestive heart failure) (HCC)    Colitis    mild    GERD (gastroesophageal reflux disease)    H/O hiatal hernia    Hypertension    hx of 10 years ago ; PT'S BLOOD PRESSURE RECENTLY ELEVATED WHILE IN HOSP FOR SURG- NOT ON ANY B/P MEDS   Hyperthyroidism    PAF (paroxysmal atrial fibrillation) (HCC)    Pulmonary hypertension (HCC)    PASP 45 mmHg by echo 01/2022    Past Surgical History:  Procedure Laterality Date   IR RADIOLOGIST  EVAL & MGMT  08/28/2021   IR RADIOLOGIST EVAL & MGMT  03/10/2022   KNEE CLOSED REDUCTION Left 06/05/2014   Procedure: CLOSED MANIPULATION KNEE;  Surgeon: Mauri Pole, MD;  [facility];  Service: Orthopedics;  Laterality: Left;   RIGHT/LEFT HEART CATH AND CORONARY ANGIOGRAPHY N/A 05/31/2019   Procedure: RIGHT/LEFT HEART CATH AND CORONARY ANGIOGRAPHY;  Surgeon: Martinique, Peter M, MD;  Location: [place] [facility];  Service: Cardiovascular;  Laterality: N/A;   ROBOTIC ASSITED PARTIAL NEPHRECTOMY Right 03/07/2021   Procedure: XI ROBOTIC ASSITED RADICAL NEPHRECTOMY;  Surgeon: Ceasar Mons, MD;  [facility];  Service: Urology;  Laterality:  Right;   TOTAL KNEE ARTHROPLASTY Left 05/07/2014   Procedure: LEFT TOTAL KNEE ARTHROPLASTY;  Surgeon: Mauri Pole, MD;  [facility];  Service: Orthopedics;  Laterality: Left;   TOTAL KNEE ARTHROPLASTY Right 06/05/2014   Procedure: RIGHT TOTAL KNEE ARTHROPLASTY;  Surgeon: Mauri Pole, MD;  [facility];  Service: Orthopedics;  Laterality: Right;   WISDOM TEETH EXTRACTIONS      Current Medications: Current Meds  Medication Sig   acetaminophen (TYLENOL) 650 MG CR tablet Take 1,300 mg by mouth in the morning. Take 2 tablets (1300 mg) by mouth scheduled every morning & may take an additional dose (1300 mg) in the evening/afternoon if needed for pain.   adapalene (DIFFERIN) 0.1 % gel Apply 1 application topically at bedtime.   amlodipine-benazepril (LOTREL) 2.5-10 MG capsule Take 1 capsule by mouth daily.   amoxicillin (AMOXIL) 500 MG capsule as directed. Dental visits only   aspirin EC 325 MG tablet Take 325 mg by mouth as needed for mild pain.   carboxymethylcellulose (REFRESH PLUS) 0.5 % SOLN Place 1-2 drops into both eyes 3 (three) times daily as needed (dry/irritated eyes.).   Dapsone 5 % topical gel Apply 1 application topically in the morning. Apply to the face daily as directed   fluticasone (FLONASE) 50 MCG/ACT nasal spray Place into both nostrils as needed for allergies or rhinitis.   furosemide (LASIX) 20 MG tablet Take 1 tablet (20 mg total) by mouth daily.   Lidocaine-Menthol (LIDOCAINE-MENTHOL ROLL-ON) 4-1 % LIQD Apply 1 application  topically 3 (three) times daily as needed (leg pain).   loratadine (CLARITIN) 10 MG tablet Take 10 mg by mouth in the morning.   methimazole (TAPAZOLE) 5 MG tablet TAKE 1 & 1/2 (ONE & ONE-HALF) TABLETS BY MOUTH IN THE MORNING AND 1 IN THE EVENING (Patient taking differently: TAKE 1 & 1/2 (ONE & ONE-HALF) TABLETS BY MOUTH IN THE MORNING)   metoprolol tartrate (LOPRESSOR) 25 MG tablet Take 1 tablet by mouth as needed for palpitations   selenium  50 MCG TABS tablet Take 200 mcg by mouth daily.     Allergies:   Hydrochlorothiazide, Hydrocodone-acetaminophen, and Other   Social History   Socioeconomic History   Marital status: Single    Spouse name: Not on file   Number of children: Not on file   Years of education: Not on file   Highest education level: Not on file  Occupational History   Not on file  Tobacco Use   Smoking status: Never   Smokeless tobacco: Never  Vaping Use   Vaping Use: Never used  Substance and Sexual Activity   Alcohol use: No   Drug use: No   Sexual activity: Not Currently  Other Topics Concern   Not on file  Social History Narrative   Not on file   Social Determinants  of Health   Financial Resource Strain: Not on file  Food Insecurity: Not on file  Transportation Needs: Not on file  Physical Activity: Not on file  Stress: Not on file  Social Connections: Not on file     Family History: The patient's family history includes Diabetes in her brother and maternal aunt; Berenice Primas' disease in her sister; Hypothyroidism in her mother.  ROS:   Please see the history of present illness.   + bilateral leg edema All other systems reviewed and are negative.  Labs/Other Studies Reviewed:    The following studies were reviewed today:  Echo 01/27/22   1. Left ventricular ejection fraction, by estimation, is 60 to 65%. The  left ventricle has normal function. The left ventricle has no regional  wall motion abnormalities. Left ventricular diastolic parameters are  consistent with Grade I diastolic  dysfunction (impaired relaxation). Elevated left atrial pressure. The  average left ventricular global longitudinal strain is -21.2 %. The global  longitudinal strain is normal.   2. Right ventricular systolic function is normal. The right ventricular  size is normal. There is mildly elevated pulmonary artery systolic  pressure.   3. The mitral valve is abnormal. Mild mitral valve regurgitation. No   evidence of mitral stenosis.   4. Tricuspid valve regurgitation is moderate.   5. The aortic valve is tricuspid. Aortic valve regurgitation is mild. No  aortic stenosis is present.   6. Aortic dilatation noted. There is mild dilatation of the ascending  aorta, measuring 42 mm.   7. The inferior vena cava is dilated in size with >50% respiratory  variability, suggesting right atrial pressure of 8 mmHg.   Comparison(s): 08/23/20 EF 55%. Ascending aorta 33m.  R/LHC 05/31/2019  There is mild left ventricular systolic dysfunction. LV end diastolic pressure is normal. The left ventricular ejection fraction is 50-55% by visual estimate.   1. Normal coronary anatomy 2. Low normal to mildly reduced LV function. Assessment limited by Afib. 3. Normal right heart pressures  4. LV filling pressure are upper normal 5. Cardiac index 2.37.   Plan: medical management. Would avoid using right radial access in the future.     Recent Labs: 03/09/2022: BUN 20; Creatinine, Ser 1.07; Potassium 4.8; Sodium 138 04/20/2022: ALT 10; Hemoglobin 13.3; Platelets 224.0 06/26/2022: TSH 0.22  Recent Lipid Panel    Component Value Date/Time   CHOL 142 05/30/2019 1852   TRIG 42 05/30/2019 1852   HDL 55 05/30/2019 1852   CHOLHDL 2.6 05/30/2019 1852   VLDL 8 05/30/2019 1852   LDLCALC 79 05/30/2019 1852     Risk Assessment/Calculations:        Physical Exam:    VS:  BP 120/68   Pulse 96   Ht '5\' 2"'$  (1.575 m)   Wt 187 lb (84.8 kg)   SpO2 97%   BMI 34.20 kg/m     Wt Readings from Last 3 Encounters:  08/07/22 187 lb (84.8 kg)  06/26/22 185 lb 9.6 oz (84.2 kg)  04/20/22 181 lb 3.2 oz (82.2 kg)     GEN:  Well nourished, well developed in no acute distress HEENT: Normal NECK: No JVD; No carotid bruits CARDIAC: RRR, no murmurs, rubs, gallops RESPIRATORY:  Clear to auscultation without rales, wheezing or rhonchi  ABDOMEN: Soft, non-tender, non-distended MUSCULOSKELETAL:  bilateral LE edema; No  deformity. 2+ pedal pulses, equal bilaterally SKIN: Warm and dry NEUROLOGIC:  Alert and oriented x 3 PSYCHIATRIC:  Normal affect   EKG:  EKG is  not ordered today.    Diagnoses:    1. Essential hypertension   2. Acquired dilation of ascending aorta and aortic root ([place])   3. Pulmonary hypertension, unspecified ([place])   4. PAF (paroxysmal atrial fibrillation) ([place])   5. Chronic diastolic congestive heart failure ([place])   6. Heart palpitations    Assessment and Plan:     Chronic HFpEF: Normal LVEF 60-65%, G1DD on echo 01/2022. Feeling improvement in DOE recently. Mild bilateral LE edema.  No orthopnea, PND. Body habitus makes it difficult to assess volume status. Cannot tolerate leg compression or elevation. Continue Lasix, metoprolol, Lotrel.  Pulmonary hypertension: Mildly elevated pulmonary artery systolic pressure, elevated left atrial pressure on echo 01/2022. She feels that her DOE has slightly improved. Encouraged regular exercise for improvement in stamina and respiratory fitness. Advised her to contact us if symptoms worsen prior to next appointment.   Hypertension: BP is well controlled.  Palpitations/PAF: DOAC stopped due to a fib felt to be 2/2 Graves disease. Occasionally feels palpitations. No evidence of a fib on Kardia monitor.   Ascending aortic dilatation: Ascending aorta 42 mm on echo 01/2022, stable in comparison to prior imaging. Will continue to monitor annually.      Disposition: 6 months with Dr. Radford Pax  Medication Adjustments/Labs and Tests Ordered: Current medicines are reviewed at length with the patient today.  Concerns regarding medicines are outlined above.  No orders of the defined types were placed in this encounter.  No orders of the defined types were placed in this encounter.   Patient Instructions  Medication Instructions:   Your physician recommends that you continue on your current medications as directed. Please refer to the Current Medication  list given to you today.   *If you need a refill on your cardiac medications before your next appointment, please call your pharmacy*   Lab Work:  None ordered.  If you have labs (blood work) drawn today and your tests are completely normal, you will receive your results only by: [place] (if you have MyChart) OR A paper copy in the mail If you have any lab test that is abnormal or we need to change your treatment, we will call you to review the results.   Testing/Procedures:  None ordered.   Follow-Up: At [facility], you and your health needs are our priority.  As part of our continuing mission to provide you with exceptional heart care, we have created designated Provider Care Teams.  These Care Teams include your primary Cardiologist (physician) and Advanced Practice Providers (APPs -  Physician Assistants and Nurse Practitioners) who all work together to provide you with the care you need, when you need it.  We recommend signing up for the patient portal called "MyChart".  Sign up information is provided on this After Visit Summary.  MyChart is used to connect with patients for Virtual Visits (Telemedicine).  Patients are able to view lab/test results, encounter notes, upcoming appointments, etc.  Non-urgent messages can be sent to your provider as well.   To learn more about what you can do with MyChart, go to [email or web address].    Your next appointment:   6 month(s)  The format for your next appointment:   In Person  Provider:   Fransico Him, MD     Important Information About Sugar         Signed, Emmaline Life, NP  08/07/2022 5:49 PM    [place]

## 2022-08-07 ENCOUNTER — Encounter: Payer: Self-pay | Admitting: Nurse Practitioner

## 2022-08-07 ENCOUNTER — Ambulatory Visit: Payer: Medicare Other | Attending: Cardiology | Admitting: Nurse Practitioner

## 2022-08-07 VITALS — BP 120/68 | HR 96 | Ht 62.0 in | Wt 187.0 lb

## 2022-08-07 DIAGNOSIS — I272 Pulmonary hypertension, unspecified: Secondary | ICD-10-CM | POA: Diagnosis not present

## 2022-08-07 DIAGNOSIS — I77819 Aortic ectasia, unspecified site: Secondary | ICD-10-CM | POA: Insufficient documentation

## 2022-08-07 DIAGNOSIS — I5032 Chronic diastolic (congestive) heart failure: Secondary | ICD-10-CM

## 2022-08-07 DIAGNOSIS — R002 Palpitations: Secondary | ICD-10-CM | POA: Diagnosis not present

## 2022-08-07 DIAGNOSIS — I10 Essential (primary) hypertension: Secondary | ICD-10-CM

## 2022-08-07 DIAGNOSIS — I480 Paroxysmal atrial fibrillation: Secondary | ICD-10-CM | POA: Diagnosis not present

## 2022-08-07 DIAGNOSIS — I77810 Thoracic aortic ectasia: Secondary | ICD-10-CM

## 2022-08-07 NOTE — Patient Instructions (Signed)
Medication Instructions:   Your physician recommends that you continue on your current medications as directed. Please refer to the Current Medication list given to you today.   *If you need a refill on your cardiac medications before your next appointment, please call your pharmacy*   Lab Work:  None ordered.  If you have labs (blood work) drawn today and your tests are completely normal, you will receive your results only by: [place] (if you have MyChart) OR A paper copy in the mail If you have any lab test that is abnormal or we need to change your treatment, we will call you to review the results.   Testing/Procedures:  None ordered.   Follow-Up: At [facility], you and your health needs are our priority.  As part of our continuing mission to provide you with exceptional heart care, we have created designated Provider Care Teams.  These Care Teams include your primary Cardiologist (physician) and Advanced Practice Providers (APPs -  Physician Assistants and Nurse Practitioners) who all work together to provide you with the care you need, when you need it.  We recommend signing up for the patient portal called "MyChart".  Sign up information is provided on this After Visit Summary.  MyChart is used to connect with patients for Virtual Visits (Telemedicine).  Patients are able to view lab/test results, encounter notes, upcoming appointments, etc.  Non-urgent messages can be sent to your provider as well.   To learn more about what you can do with MyChart, go to [email or web address].    Your next appointment:   6 month(s)  The format for your next appointment:   In Person  Provider:   Fransico Him, MD     Important Information About Sugar

## 2022-08-27 DIAGNOSIS — H04123 Dry eye syndrome of bilateral lacrimal glands: Secondary | ICD-10-CM | POA: Diagnosis not present

## 2022-08-27 DIAGNOSIS — H532 Diplopia: Secondary | ICD-10-CM | POA: Diagnosis not present

## 2022-08-27 DIAGNOSIS — H02531 Eyelid retraction right upper eyelid: Secondary | ICD-10-CM | POA: Diagnosis not present

## 2022-08-27 DIAGNOSIS — H02534 Eyelid retraction left upper eyelid: Secondary | ICD-10-CM | POA: Diagnosis not present

## 2022-08-27 DIAGNOSIS — H05123 Orbital myositis, bilateral: Secondary | ICD-10-CM | POA: Diagnosis not present

## 2022-08-27 DIAGNOSIS — H40053 Ocular hypertension, bilateral: Secondary | ICD-10-CM | POA: Diagnosis not present

## 2022-08-27 DIAGNOSIS — E0500 Thyrotoxicosis with diffuse goiter without thyrotoxic crisis or storm: Secondary | ICD-10-CM | POA: Diagnosis not present

## 2022-08-31 DIAGNOSIS — C641 Malignant neoplasm of right kidney, except renal pelvis: Secondary | ICD-10-CM | POA: Diagnosis not present

## 2022-08-31 DIAGNOSIS — D49512 Neoplasm of unspecified behavior of left kidney: Secondary | ICD-10-CM | POA: Diagnosis not present

## 2022-09-01 ENCOUNTER — Encounter: Payer: Self-pay | Admitting: Endocrinology

## 2022-09-01 ENCOUNTER — Ambulatory Visit (INDEPENDENT_AMBULATORY_CARE_PROVIDER_SITE_OTHER): Payer: Medicare Other | Admitting: Endocrinology

## 2022-09-01 VITALS — BP 142/84 | HR 58 | Ht 62.0 in | Wt 189.4 lb

## 2022-09-01 DIAGNOSIS — E0590 Thyrotoxicosis, unspecified without thyrotoxic crisis or storm: Secondary | ICD-10-CM | POA: Diagnosis not present

## 2022-09-01 NOTE — Progress Notes (Signed)
Patient ID: Christina [name], female   DOB: 1951-07-26, 71 y.o.   MRN: [id number]                                                                                                               Reason for Appointment:  Hyperthyroidism, follow-up visit   Chief complaint: Follow-up of thyroid   History of Present Illness:   Prior history: For the last several years the patient has had symptoms of occasional palpitations, shortness of breath on exertion, shakiness and feeling somewhat warm.  However she did not have any recent worsening of the symptoms except on the morning of hospitalization She previously did not have any unusual weight loss, anxiety or fatigue She was also having some swelling of her legs prior to admission Since she was having difficulties with tachycardia and atrial fibrillation her thyroid level was checked in the hospital and indicated hyperthyroidism  RECENT HISTORY:  Baseline symptoms before treatment were occasional palpitations, shortness of breath on exertion, shakiness and feeling warm She has Graves' disease confirmed with a baseline thyrotropin receptor antibody level of 3.9  She has been treated with methimazole since 05/2019  Her dose has fluctuated between 5 and 12.5 mg daily  She periodic adjustments of her dose   She was told to reduce the methimazole dose to 1-1/2 tablets  daily of the 5 mg after her last visit in October and stop the 5 mg in the evening Appears to be requiring progressively less medications since the dose was increased in June  She does think she felt a little less tired after her last visit but now again feels tired Her weight is up slightly but she thinks it could be swelling 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  Thyroid levels pending  Last thyrotropin receptor antibody in 7/23 was similar to her level a year ago and much better compared to 1/23   Wt Readings from Last 3 Encounters:  09/01/22 189 lb 6.4 oz (85.9 kg)   08/07/22 187 lb (84.8 kg)  06/26/22 185 lb 9.6 oz (84.2 kg)   Thyroid function tests as follows:     Lab Results  Component Value Date   FREET4 0.50 (L) 06/26/2022   FREET4 1.09 04/20/2022   FREET4 2.69 (H) 03/09/2022   T3FREE 6.1 (H) 03/09/2022   T3FREE 2.9 01/05/2022   T3FREE 3.8 07/28/2021   TSH 0.22 (L) 06/26/2022   TSH 0.01 (L) 04/20/2022   TSH <0.01 (L) 03/09/2022    Lab Results  Component Value Date   THYROTRECAB 26.60 (H) 04/20/2022   THYROTRECAB 149.00 (H) 09/29/2021   THYROTRECAB 26.80 (H) 05/01/2021   OPHTHALMOPATHY:  Since about 11/20 she was having more swelling of her eyes, some double vision, and a pressure sensation Subsequently in December she had more fuzzy vision, feeling of fullness in her eyes and continued double vision She went to the urgent care center and was given a Medrol Dosepak  With the recurrence of the symptoms in 1/21 she was again started on  tapering dose of prednisone Has been treated by oculoplastic ophthalmologist and she had been on Medrol high-dose weekly injections started in late February with only mild and short lasting relief  She had a complete the course of Tepezza  infusions  This improved her vision and has reduced the swelling and fullness in her eyes  She had another round of infusions in 2023 but had to stop after the fourth infusion because of tinnitus, last infusion was 02/27/22  She has no recurrence of her high symptoms and has seen her ophthalmologist regularly  She uses refresh drops for dryness  CT scan of orbits has not shown any proptosis and only thickening of the eye muscles which has improved    Allergies as of 09/01/2022       Reactions   Hydrochlorothiazide Other (See Comments), Hypertension   Wildly fluctuates B/P low-to-high, then high-to-low, in a matter of minutes   Hydrocodone-acetaminophen Nausea And Vomiting   Patient DOES NOT wish to take EVER again...   Other Nausea And Vomiting, Other (See  Comments)   Stronger pain meds (post-op) caused dehydration and constipation        Medication List        Accurate as of September 01, 2022  2:35 PM. If you have any questions, ask your nurse or doctor.          acetaminophen 650 MG CR tablet Commonly known as: TYLENOL Take 1,300 mg by mouth in the morning. Take 2 tablets (1300 mg) by mouth scheduled every morning & may take an additional dose (1300 mg) in the evening/afternoon if needed for pain.   adapalene 0.1 % gel Commonly known as: DIFFERIN Apply 1 application topically at bedtime.   amlodipine-benazepril 2.5-10 MG capsule Commonly known as: LOTREL Take 1 capsule by mouth daily.   amoxicillin 500 MG capsule Commonly known as: AMOXIL as directed. Dental visits only   aspirin EC 325 MG tablet Take 325 mg by mouth as needed for mild pain.   carboxymethylcellulose 0.5 % Soln Commonly known as: REFRESH PLUS Place 1-2 drops into both eyes 3 (three) times daily as needed (dry/irritated eyes.).   Dapsone 5 % topical gel Apply 1 application topically in the morning. Apply to the face daily as directed   fluticasone 50 MCG/ACT nasal spray Commonly known as: [place] into both nostrils as needed for allergies or rhinitis.   furosemide 20 MG tablet Commonly known as: LASIX Take 1 tablet (20 mg total) by mouth daily.   Lidocaine-Menthol Roll-On 4-1 % Liqd Generic drug: Lidocaine-Menthol Apply 1 application  topically 3 (three) times daily as needed (leg pain).   loratadine 10 MG tablet Commonly known as: CLARITIN Take 10 mg by mouth in the morning.   methimazole 5 MG tablet Commonly known as: TAPAZOLE TAKE 1 & 1/2 (ONE & ONE-HALF) TABLETS BY MOUTH IN THE MORNING AND 1 IN THE EVENING What changed: additional instructions   metoprolol tartrate 25 MG tablet Commonly known as: LOPRESSOR Take 1 tablet by mouth as needed for palpitations   selenium 50 MCG Tabs tablet Take 200 mcg by mouth daily.             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 - STILL IN PHYSICAL THERAPY   Asthma  hx of - PT STATES MILD - NO LONGER REQUIRES INHALERS   Cancer ([place])    renal cell carcinoma   CHF (congestive heart failure) (HCC)    Colitis    mild    GERD (gastroesophageal reflux disease)    H/O hiatal hernia    Hypertension    hx of 10 years ago ; PT'S BLOOD PRESSURE RECENTLY ELEVATED WHILE IN HOSP FOR SURG- NOT ON ANY B/P MEDS   Hyperthyroidism    PAF (paroxysmal atrial fibrillation) ([place])    Pulmonary hypertension (HCC)    PASP 45 mmHg by echo 01/2022    Past Surgical History:  Procedure Laterality Date   IR RADIOLOGIST EVAL & MGMT  08/28/2021   IR RADIOLOGIST EVAL & MGMT  03/10/2022   KNEE CLOSED REDUCTION Left 06/05/2014   Procedure: CLOSED MANIPULATION KNEE;  Surgeon: Mauri Pole, MD;  Location: [facility];  Service: Orthopedics;  Laterality: Left;   RIGHT/LEFT HEART CATH AND CORONARY ANGIOGRAPHY N/A 05/31/2019   Procedure: RIGHT/LEFT HEART CATH AND CORONARY ANGIOGRAPHY;  Surgeon: Martinique, Peter M, MD;  Location: [facility];  Service: Cardiovascular;  Laterality: N/A;   ROBOTIC ASSITED PARTIAL NEPHRECTOMY Right 03/07/2021   Procedure: XI ROBOTIC ASSITED RADICAL NEPHRECTOMY;  Surgeon: Ceasar Mons, MD;  Location: [facility];  Service: Urology;  Laterality: Right;   TOTAL KNEE ARTHROPLASTY Left 05/07/2014   Procedure: LEFT TOTAL KNEE ARTHROPLASTY;  Surgeon: Mauri Pole, MD;  Location: [facility];  Service: Orthopedics;  Laterality: Left;   TOTAL KNEE ARTHROPLASTY Right 06/05/2014   Procedure: RIGHT TOTAL KNEE ARTHROPLASTY;  Surgeon: Mauri Pole, MD;  Location: [facility];  Service: Orthopedics;  Laterality: Right;   WISDOM TEETH EXTRACTIONS      Family History  Problem Relation Age of Onset    Hypothyroidism Mother    Berenice Primas' disease Sister    Diabetes Brother    Diabetes Maternal Aunt     Social History:  reports that she has never smoked. She has never used smokeless tobacco. She reports that she does not drink alcohol and does not use drugs.  Allergies:  Allergies  Allergen Reactions   Hydrochlorothiazide Other (See Comments) and Hypertension    Wildly fluctuates B/P low-to-high, then high-to-low, in a matter of minutes   Hydrocodone-Acetaminophen Nausea And Vomiting    Patient DOES NOT wish to take EVER again...   Other Nausea And Vomiting and Other (See Comments)    Stronger pain meds (post-op) caused dehydration and constipation     Review of Systems  Essential hypertension: Treated by cardiologist with Lotrel  BP Readings from Last 3 Encounters:  09/01/22 (!) 142/84  08/07/22 120/68  06/26/22 (!) 140/80       Examination:   BP (!) 142/84   Pulse (!) 58   Ht '5\' 2"'$  (1.575 m)   Wt 189 lb 6.4 oz (85.9 kg)   SpO2 99%   BMI 34.64 kg/m      Assessment/Plan:   Hyperthyroidism, from Graves' disease with baseline thyrotropin receptor antibody of 3.9  She has been on methimazole since early September 2020   She has had frequent adjustments of her methimazole dose although not requiring relatively higher doses than before Currently taking a total of 7.5 mg daily, dose was reduced in October  Again having some fatigue but her symptoms are difficult to correlate with her thyroid levels usually  Has gained a little weight again  Thyroid levels to be checked today as also thyrotropin receptor antibody  Ophthalmopathy: Improved and stable  Elayne Snare 09/01/2022, 2:35 PM    Note: This office note was prepared with Dragon voice recognition system technology. Any transcriptional errors that result from this process are unintentional.

## 2022-09-02 LAB — CBC
HCT: 42.1 % (ref 36.0–46.0)
Hemoglobin: 14.2 g/dL (ref 12.0–15.0)
MCHC: 33.8 g/dL (ref 30.0–36.0)
MCV: 92.7 fl (ref 78.0–100.0)
Platelets: 269 10*3/uL (ref 150.0–400.0)
RBC: 4.55 Mil/uL (ref 3.87–5.11)
RDW: 13.7 % (ref 11.5–15.5)
WBC: 5.9 10*3/uL (ref 4.0–10.5)

## 2022-09-02 LAB — TSH: TSH: 0.03 u[IU]/mL — ABNORMAL LOW (ref 0.35–5.50)

## 2022-09-02 LAB — THYROTROPIN RECEPTOR AUTOABS: Thyrotropin Receptor Ab: 15 IU/L — ABNORMAL HIGH (ref 0.00–1.75)

## 2022-09-02 LAB — T4, FREE: Free T4: 0.78 ng/dL (ref 0.60–1.60)

## 2022-09-02 LAB — T3, FREE: T3, Free: 3.5 pg/mL (ref 2.3–4.2)

## 2022-09-02 NOTE — Progress Notes (Signed)
Please call to let patient know that the thyroid results are normal, continue 7.5 mg methimazole.  Antibody test improved but still high.  Fax results to ophthalmologist Dr. Leonard Schwartz also

## 2022-11-02 ENCOUNTER — Ambulatory Visit: Payer: Medicare Other | Admitting: Endocrinology

## 2022-12-14 ENCOUNTER — Ambulatory Visit (INDEPENDENT_AMBULATORY_CARE_PROVIDER_SITE_OTHER): Payer: Medicare Other | Admitting: Endocrinology

## 2022-12-14 ENCOUNTER — Encounter: Payer: Self-pay | Admitting: Endocrinology

## 2022-12-14 VITALS — BP 128/80 | HR 61 | Ht 62.0 in | Wt 193.8 lb

## 2022-12-14 DIAGNOSIS — E0590 Thyrotoxicosis, unspecified without thyrotoxic crisis or storm: Secondary | ICD-10-CM | POA: Diagnosis not present

## 2022-12-14 NOTE — Progress Notes (Signed)
Patient ID: Christina [name], female   DOB: 11-Nov-1950, 72 y.o.   MRN: [id number]                                                                                                               Reason for Appointment:  Hyperthyroidism, follow-up visit   Chief complaint: Follow-up of thyroid   History of Present Illness:   Prior history: For the last several years the patient has had symptoms of occasional palpitations, shortness of breath on exertion, shakiness and feeling somewhat warm.  However she did not have any recent worsening of the symptoms except on the morning of hospitalization She previously did not have any unusual weight loss, anxiety or fatigue She was also having some swelling of her legs prior to admission Since she was having difficulties with tachycardia and atrial fibrillation her thyroid level was checked in the hospital and indicated hyperthyroidism  RECENT HISTORY:  Baseline symptoms before treatment were occasional palpitations, shortness of breath on exertion, shakiness and feeling warm She has Graves' disease confirmed with a baseline thyrotropin receptor antibody level of 3.9  She has been treated with methimazole since 05/2019  Her dose has fluctuated between 5 and 12.5 mg daily  She has required periodic adjustments of her dose   She was told to reduce the methimazole dose to 1-1/2 tablets  daily of the 5 mg after her visit in October   She is complaining of feeling more tired for the last few months  Also is concerned about her weight gain She is still having hair loss but it may be more prominent now Also cold intolerance may be worse She takes methimazole regularly  Thyroid levels pending  Last thyrotropin receptor antibody in 12/23 was 15   Wt Readings from Last 3 Encounters:  12/14/22 193 lb 12.8 oz (87.9 kg)  09/01/22 189 lb 6.4 oz (85.9 kg)  08/07/22 187 lb (84.8 kg)   Thyroid function tests as follows:     Lab Results  Component Value  Date   FREET4 0.78 09/01/2022   FREET4 0.50 (L) 06/26/2022   FREET4 1.09 04/20/2022   T3FREE 3.5 09/01/2022   T3FREE 6.1 (H) 03/09/2022   T3FREE 2.9 01/05/2022   TSH 0.03 (L) 09/01/2022   TSH 0.22 (L) 06/26/2022   TSH 0.01 (L) 04/20/2022    Lab Results  Component Value Date   THYROTRECAB 15.00 (H) 09/01/2022   THYROTRECAB 26.60 (H) 04/20/2022   THYROTRECAB 149.00 (H) 09/29/2021   OPHTHALMOPATHY:  Since about 11/20 she was having more swelling of her eyes, some double vision, and a pressure sensation Subsequently in December she had more fuzzy vision, feeling of fullness in her eyes and continued double vision She went to the urgent care center and was given a Medrol Dosepak  With the recurrence of the symptoms in 1/21 she was again started on tapering dose of prednisone Has been treated by oculoplastic ophthalmologist and she had been on Medrol high-dose weekly injections started in late February with only mild and  short lasting relief  She had a complete the course of Tepezza  infusions  This improved her vision and has reduced the swelling and fullness in her eyes  She had another round of infusions in 2023 but had to stop after the fourth infusion because of tinnitus, last infusion was 02/27/22  No recent problems with excessive prominence or swelling, has had only some issues with allergies  She uses refresh drops for dryness  CT scan of orbits has not shown any proptosis and only thickening of the eye muscles which has improved    Allergies as of 12/14/2022       Reactions   Hydrochlorothiazide Other (See Comments), Hypertension   Wildly fluctuates B/P low-to-high, then high-to-low, in a matter of minutes   Hydrocodone-acetaminophen Nausea And Vomiting   Patient DOES NOT wish to take EVER again...   Other Nausea And Vomiting, Other (See Comments)   Stronger pain meds (post-op) caused dehydration and constipation        Medication List        Accurate as of  December 14, 2022  1:54 PM. If you have any questions, ask your nurse or doctor.          acetaminophen 650 MG CR tablet Commonly known as: TYLENOL Take 1,300 mg by mouth in the morning. Take 2 tablets (1300 mg) by mouth scheduled every morning & may take an additional dose (1300 mg) in the evening/afternoon if needed for pain.   adapalene 0.1 % gel Commonly known as: DIFFERIN Apply 1 application topically at bedtime.   amlodipine-benazepril 2.5-10 MG capsule Commonly known as: LOTREL Take 1 capsule by mouth daily.   amoxicillin 500 MG capsule Commonly known as: AMOXIL as directed. Dental visits only   aspirin EC 325 MG tablet Take 325 mg by mouth as needed for mild pain.   carboxymethylcellulose 0.5 % Soln Commonly known as: REFRESH PLUS Place 1-2 drops into both eyes 3 (three) times daily as needed (dry/irritated eyes.).   Dapsone 5 % topical gel Apply 1 application topically in the morning. Apply to the face daily as directed   fluticasone 50 MCG/ACT nasal spray Commonly known as: [place] into both nostrils as needed for allergies or rhinitis.   furosemide 20 MG tablet Commonly known as: LASIX Take 1 tablet (20 mg total) by mouth daily.   Lidocaine-Menthol Roll-On 4-1 % Liqd Generic drug: Lidocaine-Menthol Apply 1 application  topically 3 (three) times daily as needed (leg pain).   loratadine 10 MG tablet Commonly known as: CLARITIN Take 10 mg by mouth in the morning.   methimazole 5 MG tablet Commonly known as: TAPAZOLE TAKE 1 & 1/2 (ONE & ONE-HALF) TABLETS BY MOUTH IN THE MORNING AND 1 IN THE EVENING What changed: additional instructions   metoprolol tartrate 25 MG tablet Commonly known as: LOPRESSOR Take 1 tablet by mouth as needed for palpitations   selenium 50 MCG Tabs tablet Take 200 mcg by mouth daily.            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 - STILL IN PHYSICAL THERAPY   Asthma    hx of - PT STATES MILD - NO LONGER REQUIRES INHALERS   Cancer ([place])    renal cell carcinoma   CHF (congestive  heart failure) (HCC)    Colitis    mild    GERD (gastroesophageal reflux disease)    H/O hiatal hernia    Hypertension    hx of 10 years ago ; PT'S BLOOD PRESSURE RECENTLY ELEVATED WHILE IN HOSP FOR SURG- NOT ON ANY B/P MEDS   Hyperthyroidism    PAF (paroxysmal atrial fibrillation) ([place])    Pulmonary hypertension (HCC)    PASP 45 mmHg by echo 01/2022    Past Surgical History:  Procedure Laterality Date   IR RADIOLOGIST EVAL & MGMT  08/28/2021   IR RADIOLOGIST EVAL & MGMT  03/10/2022   KNEE CLOSED REDUCTION Left 06/05/2014   Procedure: CLOSED MANIPULATION KNEE;  Surgeon: Mauri Pole, MD;  [facility];  Service: Orthopedics;  Laterality: Left;   RIGHT/LEFT HEART CATH AND CORONARY ANGIOGRAPHY N/A 05/31/2019   Procedure: RIGHT/LEFT HEART CATH AND CORONARY ANGIOGRAPHY;  Surgeon: Martinique, Peter M, MD;  Location: [facility];  Service: Cardiovascular;  Laterality: N/A;   ROBOTIC ASSITED PARTIAL NEPHRECTOMY Right 03/07/2021   Procedure: XI ROBOTIC ASSITED RADICAL NEPHRECTOMY;  Surgeon: Ceasar Mons, MD;  [facility];  Service: Urology;  Laterality: Right;   TOTAL KNEE ARTHROPLASTY Left 05/07/2014   Procedure: LEFT TOTAL KNEE ARTHROPLASTY;  Surgeon: Mauri Pole, MD;  [facility];  Service: Orthopedics;  Laterality: Left;   TOTAL KNEE ARTHROPLASTY Right 06/05/2014   Procedure: RIGHT TOTAL KNEE ARTHROPLASTY;  Surgeon: Mauri Pole, MD;  [facility];  Service: Orthopedics;  Laterality: Right;   WISDOM TEETH EXTRACTIONS      Family History  Problem Relation Age of Onset   Hypothyroidism Mother    Berenice Primas' disease Sister    Diabetes Brother    Diabetes Maternal Aunt     Social History:  reports that she has  never smoked. She has never used smokeless tobacco. She reports that she does not drink alcohol and does not use drugs.  Allergies:  Allergies  Allergen Reactions   Hydrochlorothiazide Other (See Comments) and Hypertension    Wildly fluctuates B/P low-to-high, then high-to-low, in a matter of minutes   Hydrocodone-Acetaminophen Nausea And Vomiting    Patient DOES NOT wish to take EVER again...   Other Nausea And Vomiting and Other (See Comments)    Stronger pain meds (post-op) caused dehydration and constipation     Review of Systems  Essential hypertension: Treated by cardiologist with Lotrel  BP Readings from Last 3 Encounters:  12/14/22 128/80  09/01/22 (!) 142/84  08/07/22 120/68       Examination:   BP 128/80 (BP Location: Left Arm, Patient Position: Sitting, Cuff Size: Normal)   Pulse 61   Ht 5\' 2"  (1.575 m)   Wt 193 lb 12.8 oz (87.9 kg)   SpO2 99%   BMI 35.45 kg/m   Thyroid not palpable Mild bilateral upper lid edema present Biceps reflexes are showing normal relaxation   Assessment/Plan:   Hyperthyroidism, from Graves' disease with baseline thyrotropin receptor antibody of 3.9  She has been on methimazole since early September 2020   She has had frequent adjustments of her methimazole dose although not requiring relatively higher doses than before Currently taking a total of 7.5 mg daily, dose was unchanged on her last visit when free T4 improved  Having more fatigue but more her symptoms are difficult to correlate with her thyroid levels usually  Has gained some weight again  Thyroid levels to be checked today and methimazole adjusted accordingly  We will see her sooner in follow-up after this visit  Elayne Snare 12/14/2022, 1:54 PM    Note: This office note was prepared with Dragon voice recognition system technology. Any transcriptional errors that result from this process are unintentional.

## 2022-12-15 LAB — TSH: TSH: 0.03 u[IU]/mL — ABNORMAL LOW (ref 0.35–5.50)

## 2022-12-15 LAB — T4, FREE: Free T4: 1.08 ng/dL (ref 0.60–1.60)

## 2022-12-15 LAB — T3, FREE: T3, Free: 3 pg/mL (ref 2.3–4.2)

## 2022-12-15 NOTE — Progress Notes (Signed)
Please call to let patient know that the lab results are normal and no change in dosage  needed

## 2022-12-24 DIAGNOSIS — H04123 Dry eye syndrome of bilateral lacrimal glands: Secondary | ICD-10-CM | POA: Diagnosis not present

## 2022-12-24 DIAGNOSIS — H2513 Age-related nuclear cataract, bilateral: Secondary | ICD-10-CM | POA: Diagnosis not present

## 2022-12-24 DIAGNOSIS — H40053 Ocular hypertension, bilateral: Secondary | ICD-10-CM | POA: Diagnosis not present

## 2022-12-24 DIAGNOSIS — H02531 Eyelid retraction right upper eyelid: Secondary | ICD-10-CM | POA: Diagnosis not present

## 2022-12-24 DIAGNOSIS — H02534 Eyelid retraction left upper eyelid: Secondary | ICD-10-CM | POA: Diagnosis not present

## 2022-12-24 DIAGNOSIS — H0270 Unspecified degenerative disorders of eyelid and periocular area: Secondary | ICD-10-CM | POA: Diagnosis not present

## 2022-12-24 DIAGNOSIS — E0500 Thyrotoxicosis with diffuse goiter without thyrotoxic crisis or storm: Secondary | ICD-10-CM | POA: Diagnosis not present

## 2022-12-24 DIAGNOSIS — H05123 Orbital myositis, bilateral: Secondary | ICD-10-CM | POA: Diagnosis not present

## 2022-12-24 DIAGNOSIS — H532 Diplopia: Secondary | ICD-10-CM | POA: Diagnosis not present

## 2023-01-27 ENCOUNTER — Other Ambulatory Visit: Payer: Self-pay | Admitting: Interventional Radiology

## 2023-01-27 DIAGNOSIS — N289 Disorder of kidney and ureter, unspecified: Secondary | ICD-10-CM

## 2023-02-02 ENCOUNTER — Encounter: Payer: Self-pay | Admitting: Cardiology

## 2023-02-02 ENCOUNTER — Ambulatory Visit: Payer: Medicare Other | Attending: Cardiology | Admitting: Cardiology

## 2023-02-02 VITALS — BP 144/88 | HR 80 | Ht 62.0 in | Wt 200.4 lb

## 2023-02-02 DIAGNOSIS — I77819 Aortic ectasia, unspecified site: Secondary | ICD-10-CM | POA: Insufficient documentation

## 2023-02-02 DIAGNOSIS — I480 Paroxysmal atrial fibrillation: Secondary | ICD-10-CM | POA: Insufficient documentation

## 2023-02-02 DIAGNOSIS — I272 Pulmonary hypertension, unspecified: Secondary | ICD-10-CM | POA: Insufficient documentation

## 2023-02-02 DIAGNOSIS — I10 Essential (primary) hypertension: Secondary | ICD-10-CM

## 2023-02-02 DIAGNOSIS — I5032 Chronic diastolic (congestive) heart failure: Secondary | ICD-10-CM | POA: Diagnosis not present

## 2023-02-02 MED ORDER — AMLODIPINE BESY-BENAZEPRIL HCL 2.5-10 MG PO CAPS: 1.0000 | ORAL_CAPSULE | Freq: Every day | ORAL | 3 refills | Status: DC

## 2023-02-02 MED ORDER — FUROSEMIDE 20 MG PO TABS: 20.0000 mg | ORAL_TABLET | Freq: Every day | ORAL | 3 refills | Status: DC

## 2023-02-02 NOTE — Patient Instructions (Addendum)
Medication Instructions:  Your physician recommends that you continue on your current medications as directed. Please refer to the Current Medication list given to you today.  *If you need a refill on your cardiac medications before your next appointment, please call your pharmacy*  Lab Work: TODAY: BMET If you have labs (blood work) drawn today and your tests are completely normal, you will receive your results only by: MyChart Message (if you have MyChart) OR A paper copy in the mail If you have any lab test that is abnormal or we need to change your treatment, we will call you to review the results.  Testing/Procedures: Your physician has requested that you have an echocardiogram. Echocardiography is a painless test that uses sound waves to create images of your heart. It provides your doctor with information about the size and shape of your heart and how well your heart's chambers and valves are working. This procedure takes approximately one hour. There are no restrictions for this procedure. Please do NOT wear cologne, perfume, aftershave, or lotions (deodorant is allowed). Please arrive 15 minutes prior to your appointment time.   Follow-Up: At [facility], you and your health needs are our priority.  As part of our continuing mission to provide you with exceptional heart care, we have created designated Provider Care Teams.  These Care Teams include your primary Cardiologist (physician) and Advanced Practice Providers (APPs -  Physician Assistants and Nurse Practitioners) who all work together to provide you with the care you need, when you need it.  Your next appointment:   1 year(s)  The format for your next appointment:   In Person  Provider:   Armanda Magic, MD {

## 2023-02-02 NOTE — Progress Notes (Signed)
Cardiology Office Note:    Date:  02/02/2023   ID:  Deniese, Aubry 07/26/1951, MRN [id number]  PCP:  Lise Auer, MD  Cardiologist:  Armanda Magic, MD    Referring MD: Lise Auer, MD   Chief Complaint  Patient presents with   Congestive Heart Failure   Atrial Fibrillation   Hypertension    History of Present Illness:    Christina [name] is a 72 y.o. female with a hx of chronic systolic CHF, normal coronary arteries, PAF, hyperthyroidism, asthma, GERD and HTN.  She is treated for her hyperthyroidism by Endocrine and is on Tapazole.     She is here today for followup and is doing well.  She has chronic SOB that is very stable. She also has chronic LE edema that she thinks has gotten worse. She sits all day at work and she has tried compression hose but they are uncomfortable and does not want to use them. She denies any chest pain or pressure, PND, orthopnea, dizziness, palpitations or syncope. She is compliant with his meds and is tolerating meds with no SE.    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 - STILL IN PHYSICAL THERAPY   Asthma    hx of - PT STATES MILD - NO LONGER REQUIRES INHALERS   Cancer (HCC)    renal cell carcinoma   CHF (congestive heart failure) (HCC)    Colitis    mild    GERD (gastroesophageal reflux disease)    H/O hiatal hernia    Hypertension    hx of 10 years ago ; PT'S BLOOD PRESSURE RECENTLY ELEVATED WHILE IN HOSP FOR SURG- NOT ON ANY B/P MEDS   Hyperthyroidism    PAF (paroxysmal atrial fibrillation) (HCC)    Pulmonary hypertension (HCC)    PASP 45 mmHg by echo 01/2022    Past Surgical History:  Procedure Laterality Date   IR RADIOLOGIST EVAL & MGMT  08/28/2021   IR RADIOLOGIST EVAL & MGMT  03/10/2022   KNEE CLOSED REDUCTION Left 06/05/2014   Procedure: CLOSED  MANIPULATION KNEE;  Surgeon: Shelda Pal, MD;  Location: [facility];  Service: Orthopedics;  Laterality: Left;   RIGHT/LEFT HEART CATH AND CORONARY ANGIOGRAPHY N/A 05/31/2019   Procedure: RIGHT/LEFT HEART CATH AND CORONARY ANGIOGRAPHY;  Surgeon: Swazil[place], Peter M, MD;  Location: [facility] INVASIVE CV LAB;  Service: Cardiovascular;  Laterality: N/A;   ROBOTIC ASSITED PARTIAL NEPHRECTOMY Right 03/07/2021   Procedure: XI ROBOTIC ASSITED RADICAL NEPHRECTOMY;  Surgeon: Rene Paci, MD;  Location: [facility];  Service: Urology;  Laterality: Right;   TOTAL KNEE ARTHROPLASTY Left 05/07/2014   Procedure: LEFT TOTAL KNEE ARTHROPLASTY;  Surgeon: Shelda Pal, MD;  Location: [facility];  Service: Orthopedics;  Laterality: Left;   TOTAL KNEE ARTHROPLASTY Right 06/05/2014   Procedure: RIGHT TOTAL KNEE ARTHROPLASTY;  Surgeon: Shelda Pal, MD;  Location: [facility];  Service: Orthopedics;  Laterality: Right;   WISDOM TEETH EXTRACTIONS      Current Medications: Current Meds  Medication Sig   acetaminophen (TYLENOL) 650 MG CR tablet Take 1,300 mg by mouth in the morning. Take 2 tablets (1300 mg) by mouth scheduled every morning & may take an additional dose (1300 mg) in the evening/afternoon if needed for pain.  adapalene (DIFFERIN) 0.1 % gel Apply 1 application topically at bedtime.   amlodipine-benazepril (LOTREL) 2.5-10 MG capsule Take 1 capsule by mouth daily.   amoxicillin (AMOXIL) 500 MG capsule as directed. Dental visits only   APPLE CIDER VINEGAR PO Take 1 tablet by mouth daily. 1200 mg   carboxymethylcellulose (REFRESH PLUS) 0.5 % SOLN Place 1-2 drops into both eyes 3 (three) times daily as needed (dry/irritated eyes.).   Dapsone 5 % topical gel Apply 1 application topically in the morning. Apply to the face daily as directed   Flaxseed, Linseed, (FLAX SEED OIL PO) Take 1 tablet by mouth daily. 720 mg   fluticasone (FLONASE) 50 MCG/ACT nasal spray Place into both nostrils as needed for allergies or rhinitis.    furosemide (LASIX) 20 MG tablet Take 1 tablet (20 mg total) by mouth daily.   ibuprofen (ADVIL) 200 MG tablet Take 200 mg by mouth in the morning and at bedtime.   Lidocaine-Menthol (LIDOCAINE-MENTHOL ROLL-ON) 4-1 % LIQD Apply 1 application  topically 3 (three) times daily as needed (leg pain).   LUTEIN PO Take 1 tablet by mouth daily. 25 mg   methimazole (TAPAZOLE) 5 MG tablet TAKE 1 & 1/2 (ONE & ONE-HALF) TABLETS BY MOUTH IN THE MORNING AND 1 IN THE EVENING (Patient taking differently: TAKE 1 & 1/2 (ONE & ONE-HALF) TABLETS BY MOUTH IN THE MORNING)   metoprolol tartrate (LOPRESSOR) 25 MG tablet Take 1 tablet by mouth as needed for palpitations   selenium 50 MCG TABS tablet Take 200 mcg by mouth daily.     Allergies:   Hydrochlorothiazide, Hydrocodone-acetaminophen, and Other   Social History   Socioeconomic History   Marital status: Single    Spouse name: Not on file   Number of children: Not on file   Years of education: Not on file   Highest education level: Not on file  Occupational History   Not on file  Tobacco Use   Smoking status: Never   Smokeless tobacco: Never  Vaping Use   Vaping Use: Never used  Substance and Sexual Activity   Alcohol use: No   Drug use: No   Sexual activity: Not Currently  Other Topics Concern   Not on file  Social History Narrative   Not on file   Social Determinants of Health   Financial Resource Strain: Not on file  Food Insecurity: Not on file  Transportation Needs: Not on file  Physical Activity: Not on file  Stress: Not on file  Social Connections: Not on file     Family History: The patient's family history includes Diabetes in her brother and maternal aunt; Luiz Blare' disease in her sister; Hypothyroidism in her mother.  ROS:   Please see the history of present illness.    ROS  All other systems reviewed and negative.   EKGs/Labs/Other Studies Reviewed:    The following studies were reviewed today: none  EKG:  EKG is  ordered today and demonstrates NSR at 73bpm with no ST changes  Recent Labs: 03/09/2022: BUN 20; Creatinine, Ser 1.07; Potassium 4.8; Sodium 138 04/20/2022: ALT 10 09/01/2022: Hemoglobin 14.2; Platelets 269.0 12/14/2022: TSH 0.03   Recent Lipid Panel    Component Value Date/Time   CHOL 142 05/30/2019 1852   TRIG 42 05/30/2019 1852   HDL 55 05/30/2019 1852   CHOLHDL 2.6 05/30/2019 1852   VLDL 8 05/30/2019 1852   LDLCALC 79 05/30/2019 1852    Physical Exam:    VS:  BP (!) 144/88  Pulse 80   Ht 5\' 2"  (1.575 m)   Wt 200 lb 6.4 oz (90.9 kg)   SpO2 95%   BMI 36.65 kg/m     Wt Readings from Last 3 Encounters:  02/02/23 200 lb 6.4 oz (90.9 kg)  12/14/22 193 lb 12.8 oz (87.9 kg)  09/01/22 189 lb 6.4 oz (85.9 kg)    GEN: Well nourished, well developed in no acute distress HEENT: Normal NECK: No JVD; No carotid bruits LYMPHATICS: No lymphadenopathy CARDIAC:RRR, no murmurs, rubs, gallops RESPIRATORY:  Clear to auscultation without rales, wheezing or rhonchi  ABDOMEN: Soft, non-tender, non-distended MUSCULOSKELETAL:  No edema; No deformity  SKIN: Warm and dry NEUROLOGIC:  Alert and oriented x 3 PSYCHIATRIC:  Normal affect  ASSESSMENT:    1. Chronic diastolic congestive heart failure (HCC)   2. PAF (paroxysmal atrial fibrillation) (HCC)   3. Essential hypertension   4. Acquired dilation of ascending aorta and aortic root (HCC)    PLAN:    In order of problems listed above:  Chronic diastolic CHF - cath 05/31/2019 with normal coronaries however reduced LV function on echocardiogram.   -2D echo done 08/2020 showed normal LVF with EF 55% with mild LVE -She appears euvolemic on exam today.  Her chronic SOB is stable.  She has chronic LE edema that she thinks is worse due to sitting all day but has no edema on exam today.  She does not like to wear compression hose.  -Continue prescription drug management with Lasix 20 mg daily with as needed refills -Check bmet today    PAF -occurred in setting of Grave's dz but CHADS2VASC score is 4 -She denies any palpitations and is maintaining normal sinus rhythm on exam today -she is no longer on DOAC as her afib occurred in setting of hyperthyroidism -she stopped the BB on her own because it was causing her to have bradycardia  HTN -BP borderline controlled today -Continue prescription drug management with amlodipine benazepril 2.5-10 mg daily with as needed refills  Mildly dilated ascending aorta -noted to be 4.2cm on echo 08/2020 -Chest CTA reviewed and showed 4cm dilatation -2D echo 02/07/2022 with ascending aorta at 4.1 cm -Repeat 2D echo limited  Aortic insufficiency -mild by echo 2023 -repeat echo  Mitral regurgitation -mild by echo 2023 -repeat echo  Pulmonary HTN -mild with PASP by echo 2023 -ANA and RA were negative in 2023 -sleep study with no OSA -repeat echo   Followup with me in 1 year   Medication Adjustments/Labs and Tests Ordered: Current medicines are reviewed at length with the patient today.  Concerns regarding medicines are outlined above.  Orders Placed This Encounter  Procedures   EKG 12-Lead   No orders of the defined types were placed in this encounter.   Signed, Armanda Magic, MD  02/02/2023 3:50 PM    [facility]

## 2023-02-02 NOTE — Addendum Note (Signed)
Addended by: Franchot Gallo on: 02/02/2023 04:05 PM   Modules accepted: Orders

## 2023-02-02 NOTE — Addendum Note (Signed)
Addended by: Franchot Gallo on: 02/02/2023 04:02 PM   Modules accepted: Orders

## 2023-02-03 DIAGNOSIS — H2513 Age-related nuclear cataract, bilateral: Secondary | ICD-10-CM | POA: Diagnosis not present

## 2023-02-03 DIAGNOSIS — H18593 Other hereditary corneal dystrophies, bilateral: Secondary | ICD-10-CM | POA: Diagnosis not present

## 2023-02-03 DIAGNOSIS — H04123 Dry eye syndrome of bilateral lacrimal glands: Secondary | ICD-10-CM | POA: Diagnosis not present

## 2023-02-03 LAB — BASIC METABOLIC PANEL
BUN/Creatinine Ratio: 23 (ref 12–28)
BUN: 25 mg/dL (ref 8–27)
CO2: 25 mmol/L (ref 20–29)
Calcium: 9.7 mg/dL (ref 8.7–10.3)
Chloride: 102 mmol/L (ref 96–106)
Creatinine, Ser: 1.09 mg/dL — ABNORMAL HIGH (ref 0.57–1.00)
Glucose: 89 mg/dL (ref 70–99)
Potassium: 4.8 mmol/L (ref 3.5–5.2)
Sodium: 141 mmol/L (ref 134–144)
eGFR: 55 mL/min/{1.73_m2} — ABNORMAL LOW (ref >59)

## 2023-02-17 ENCOUNTER — Ambulatory Visit: Payer: Medicare Other | Admitting: Endocrinology

## 2023-03-01 ENCOUNTER — Encounter: Payer: Self-pay | Admitting: Endocrinology

## 2023-03-01 ENCOUNTER — Ambulatory Visit (INDEPENDENT_AMBULATORY_CARE_PROVIDER_SITE_OTHER): Payer: Medicare Other | Admitting: Endocrinology

## 2023-03-01 VITALS — BP 142/88 | HR 63 | Ht 62.0 in | Wt 196.0 lb

## 2023-03-01 DIAGNOSIS — R5383 Other fatigue: Secondary | ICD-10-CM | POA: Diagnosis not present

## 2023-03-01 DIAGNOSIS — M174 Other bilateral secondary osteoarthritis of knee: Secondary | ICD-10-CM

## 2023-03-01 DIAGNOSIS — E0590 Thyrotoxicosis, unspecified without thyrotoxic crisis or storm: Secondary | ICD-10-CM

## 2023-03-01 NOTE — Patient Instructions (Signed)
Check Bp

## 2023-03-01 NOTE — Progress Notes (Signed)
Patient ID: Christina [name], female   DOB: 03-26-1951, 72 y.o.   MRN: [id number]                                                                                                               Reason for Appointment:  Hyperthyroidism, follow-up visit   Chief complaint: Follow-up of thyroid   History of Present Illness:   Prior history: For the last several years the patient has had symptoms of occasional palpitations, shortness of breath on exertion, shakiness and feeling somewhat warm.  However she did not have any recent worsening of the symptoms except on the morning of hospitalization She previously did not have any unusual weight loss, anxiety or fatigue She was also having some swelling of her legs prior to admission Since she was having difficulties with tachycardia and atrial fibrillation her thyroid level was checked in the hospital and indicated hyperthyroidism  RECENT HISTORY:  Baseline symptoms before treatment were occasional palpitations, shortness of breath on exertion, shakiness and feeling warm She has Graves' disease confirmed with a baseline thyrotropin receptor antibody level of 3.9  She has been treated with methimazole since 05/2019  Her dose has fluctuated between 5 and 12.5 mg daily  She has required periodic adjustments of her dose   She is still taking methimazole dose 1-1/2 tablets  daily of the 5 mg since her visit in October 2023  Again she is concerned about her weight gain but is not able to do much physical activity She stays tired and this is not any different than usual  Her dose was continued at the same 7.5 regimen on the last visit with stable thyroid levels She takes methimazole regularly  Thyroid levels pending from today  Last thyrotropin receptor antibody in 12/23 was 15  Wt Readings from Last 3 Encounters:  03/01/23 196 lb (88.9 kg)  02/02/23 200 lb 6.4 oz (90.9 kg)  12/14/22 193 lb 12.8 oz (87.9 kg)   Thyroid function tests as follows:      Lab Results  Component Value Date   FREET4 1.08 12/14/2022   FREET4 0.78 09/01/2022   FREET4 0.50 (L) 06/26/2022   T3FREE 3.0 12/14/2022   T3FREE 3.5 09/01/2022   T3FREE 6.1 (H) 03/09/2022   TSH 0.03 (L) 12/14/2022   TSH 0.03 (L) 09/01/2022   TSH 0.22 (L) 06/26/2022    Lab Results  Component Value Date   THYROTRECAB 15.00 (H) 09/01/2022   THYROTRECAB 26.60 (H) 04/20/2022   THYROTRECAB 149.00 (H) 09/29/2021   OPHTHALMOPATHY:  Since about 11/20 she was having more swelling of her eyes, some double vision, and a pressure sensation Subsequently in December she had more fuzzy vision, feeling of fullness in her eyes and continued double vision She went to the urgent care center and was given a Medrol Dosepak  With the recurrence of the symptoms in 1/21 she was again started on tapering dose of prednisone Has been treated by oculoplastic ophthalmologist and she had been on Medrol high-dose weekly injections started in late  February with only mild and short lasting relief  She had a complete the course of Tepezza  infusions  This improved her vision and has reduced the swelling and fullness in her eyes  She had another round of infusions in 2023 but had to stop after the fourth infusion because of tinnitus, last infusion was 02/27/22  No recent problems with excessive prominence or swelling, has had only some issues with allergies  She uses refresh drops for dryness  CT scan of orbits has not shown any proptosis and only thickening of the eye muscles which has improved    Allergies as of 03/01/2023       Reactions   Hydrochlorothiazide Other (See Comments), Hypertension   Wildly fluctuates B/P low-to-high, then high-to-low, in a matter of minutes   Hydrocodone-acetaminophen Nausea And Vomiting   Patient DOES NOT wish to take EVER again...   Other Nausea And Vomiting, Other (See Comments)   Stronger pain meds (post-op) caused dehydration and constipation         Medication List        Accurate as of March 01, 2023  2:15 PM. If you have any questions, ask your nurse or doctor.          STOP taking these medications    aspirin EC 325 MG tablet Stopped by: Reather Littler, MD       TAKE these medications    acetaminophen 650 MG CR tablet Commonly known as: TYLENOL Take 1,300 mg by mouth in the morning. Take 2 tablets (1300 mg) by mouth scheduled every morning & may take an additional dose (1300 mg) in the evening/afternoon if needed for pain.   adapalene 0.1 % gel Commonly known as: DIFFERIN Apply 1 application topically at bedtime.   amlodipine-benazepril 2.5-10 MG capsule Commonly known as: LOTREL Take 1 capsule by mouth daily.   amoxicillin 500 MG capsule Commonly known as: AMOXIL as directed. Dental visits only   APPLE CIDER VINEGAR PO Take 1 tablet by mouth daily. 1200 mg   carboxymethylcellulose 0.5 % Soln Commonly known as: REFRESH PLUS Place 1-2 drops into both eyes 3 (three) times daily as needed (dry/irritated eyes.).   Dapsone 5 % topical gel Apply 1 application topically in the morning. Apply to the face daily as directed   FLAX SEED OIL PO Take 1 tablet by mouth daily. 720 mg   fluticasone 50 MCG/ACT nasal spray Commonly known as: FLONASE Place into both nostrils as needed for allergies or rhinitis.   furosemide 20 MG tablet Commonly known as: LASIX Take 1 tablet (20 mg total) by mouth daily.   ibuprofen 200 MG tablet Commonly known as: ADVIL Take 200 mg by mouth in the morning and at bedtime.   Lidocaine-Menthol Roll-On 4-1 % Liqd Generic drug: Lidocaine-Menthol Apply 1 application  topically 3 (three) times daily as needed (leg pain).   loratadine 10 MG tablet Commonly known as: CLARITIN Take 10 mg by mouth in the morning.   LUTEIN PO Take 1 tablet by mouth daily. 25 mg   methimazole 5 MG tablet Commonly known as: TAPAZOLE Take 5 mg by mouth as directed. Take 1.5 tablets in the morning What  changed: Another medication with the same name was removed. Continue taking this medication, and follow the directions you see here. Changed by: Reather Littler, MD   metoprolol tartrate 25 MG tablet Commonly known as: LOPRESSOR Take 1 tablet by mouth as needed for palpitations   selenium 50 MCG Tabs tablet Take 200  mcg by mouth daily.            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 - STILL IN PHYSICAL THERAPY   Asthma    hx of - PT STATES MILD - NO LONGER REQUIRES INHALERS   Cancer (HCC)    renal cell carcinoma   CHF (congestive heart failure) (HCC)    Colitis    mild    GERD (gastroesophageal reflux disease)    H/O hiatal hernia    Hypertension    hx of 10 years ago ; PT'S BLOOD PRESSURE RECENTLY ELEVATED WHILE IN HOSP FOR SURG- NOT ON ANY B/P MEDS   Hyperthyroidism    PAF (paroxysmal atrial fibrillation) (HCC)    Pulmonary hypertension (HCC)    PASP 45 mmHg by echo 01/2022    Past Surgical History:  Procedure Laterality Date   IR RADIOLOGIST EVAL & MGMT  08/28/2021   IR RADIOLOGIST EVAL & MGMT  03/10/2022   KNEE CLOSED REDUCTION Left 06/05/2014   Procedure: CLOSED MANIPULATION KNEE;  Surgeon: Shelda Pal, MD;  Location: [facility];  Service: Orthopedics;  Laterality: Left;   RIGHT/LEFT HEART CATH AND CORONARY ANGIOGRAPHY N/A 05/31/2019   Procedure: RIGHT/LEFT HEART CATH AND CORONARY ANGIOGRAPHY;  Surgeon: Swazil[place], Peter M, MD;  Location: [facility] INVASIVE CV LAB;  Service: Cardiovascular;  Laterality: N/A;   ROBOTIC ASSITED PARTIAL NEPHRECTOMY Right 03/07/2021   Procedure: XI ROBOTIC ASSITED RADICAL NEPHRECTOMY;  Surgeon: Rene Paci, MD;  Location: [facility];  Service: Urology;  Laterality: Right;   TOTAL KNEE ARTHROPLASTY Left 05/07/2014   Procedure: LEFT TOTAL KNEE ARTHROPLASTY;  Surgeon: Shelda Pal, MD;  Location: [facility];  Service: Orthopedics;  Laterality: Left;   TOTAL KNEE ARTHROPLASTY Right 06/05/2014   Procedure: RIGHT TOTAL KNEE ARTHROPLASTY;  Surgeon: Shelda Pal, MD;  Location: [facility];  Service: Orthopedics;  Laterality: Right;   WISDOM TEETH EXTRACTIONS      Family History  Problem Relation Age of Onset   Hypothyroidism Mother    Luiz Blare' disease Sister    Diabetes Brother    Diabetes Maternal Aunt     Social History:  reports that she has never smoked. She has never used smokeless tobacco. She reports that she does not drink alcohol and does not use drugs.  Allergies:  Allergies  Allergen Reactions   Hydrochlorothiazide Other (See Comments) and Hypertension    Wildly fluctuates B/P low-to-high, then high-to-low, in a matter of minutes   Hydrocodone-Acetaminophen Nausea And Vomiting    Patient DOES NOT wish to take EVER again...   Other Nausea And Vomiting and Other (See Comments)    Stronger pain meds (post-op) caused dehydration and constipation     Review of Systems  Essential hypertension: Treated by cardiologist with Lotrel Not monitoring at home  BP Readings from Last 3 Encounters:  03/01/23 (!) 142/88  02/02/23 (!) 144/88  12/14/22 128/80      Examination:   BP (!) 142/88   Pulse 63   Ht 5\' 2"  (1.575 m)   Wt 196 lb (88.9 kg)   SpO2 98%   BMI 35.85 kg/m   Mild bilateral upper lid edema present Thyroid not palpable No tremor Biceps reflexes show normal relaxation   Assessment/Plan:   Hyperthyroidism, from Graves' disease  with baseline thyrotropin receptor antibody of 3.9  She has been on methimazole since early September 2020   She has had frequent adjustments of her methimazole dose although requiring slightly smaller doses lately She is still taking a total of 7.5 mg daily, dose was unchanged on her last visit  Not a candidate for I-131 treatment because of her severe ophthalmopathy history  She is complaining of fatigue  which is not explained by her thyroid levels No history of anemia but has not had a CBC this year and she is concerned about her family history of B12 deficiency  Has gained some weight again  Thyroid levels to be checked today and methimazole dosage change if needed  Recommended that she check her blood pressure regularly at home and follow-up with cardiologist if needed  Follow-up in 2 months   Jerlisa Diliberto 03/01/2023, 2:15 PM    Note: This office note was prepared with Dragon voice recognition system technology. Any transcriptional errors that result from this process are unintentional.

## 2023-03-02 LAB — CBC WITH DIFFERENTIAL/PLATELET
Basophils Absolute: 0.1 10*3/uL (ref 0.0–0.1)
Basophils Relative: 0.9 % (ref 0.0–3.0)
Eosinophils Absolute: 0.2 10*3/uL (ref 0.0–0.7)
Eosinophils Relative: 3.7 % (ref 0.0–5.0)
HCT: 43.5 % (ref 36.0–46.0)
Hemoglobin: 13.9 g/dL (ref 12.0–15.0)
Lymphocytes Relative: 24.5 % (ref 12.0–46.0)
Lymphs Abs: 1.6 10*3/uL (ref 0.7–4.0)
MCHC: 31.9 g/dL (ref 30.0–36.0)
MCV: 92.5 fl (ref 78.0–100.0)
Monocytes Absolute: 0.7 10*3/uL (ref 0.1–1.0)
Monocytes Relative: 10.6 % (ref 3.0–12.0)
Neutro Abs: 3.8 10*3/uL (ref 1.4–7.7)
Neutrophils Relative %: 60.3 % (ref 43.0–77.0)
Platelets: 309 10*3/uL (ref 150.0–400.0)
RBC: 4.7 Mil/uL (ref 3.87–5.11)
RDW: 13.4 % (ref 11.5–15.5)
WBC: 6.4 10*3/uL (ref 4.0–10.5)

## 2023-03-02 LAB — TSH: TSH: <0.01 u[IU]/mL — ABNORMAL LOW (ref 0.35–5.50)

## 2023-03-02 LAB — VITAMIN B12: Vitamin B-12: 383 pg/mL (ref 211–911)

## 2023-03-02 LAB — T4, FREE: Free T4: 1.65 ng/dL — ABNORMAL HIGH (ref 0.60–1.60)

## 2023-03-02 LAB — T3, FREE: T3, Free: 3.9 pg/mL (ref 2.3–4.2)

## 2023-03-02 NOTE — Progress Notes (Signed)
Thyroid level has gone up again.  Instead of taking just 1-1/2 tablets daily will need to add another 5 mg in the evening also of the methimazole.  B12 and blood counts are normal

## 2023-03-03 ENCOUNTER — Other Ambulatory Visit: Payer: Self-pay

## 2023-03-03 ENCOUNTER — Telehealth: Payer: Self-pay

## 2023-03-03 MED ORDER — METHIMAZOLE 5 MG PO TABS: ORAL_TABLET | ORAL | 1 refills | Status: DC

## 2023-03-03 NOTE — Telephone Encounter (Signed)
Returned patient phone call regarding refill instructions on her thyroid medication (Methimazole). Left message as patient did not answer

## 2023-03-04 ENCOUNTER — Other Ambulatory Visit: Payer: Self-pay | Admitting: Cardiology

## 2023-03-10 ENCOUNTER — Other Ambulatory Visit: Payer: Self-pay | Admitting: Interventional Radiology

## 2023-03-10 DIAGNOSIS — N289 Disorder of kidney and ureter, unspecified: Secondary | ICD-10-CM

## 2023-03-13 ENCOUNTER — Ambulatory Visit
Admission: RE | Admit: 2023-03-13 | Discharge: 2023-03-13 | Disposition: A | Discharge status: Home / Self Care | Payer: Medicare Other | Source: Ambulatory Visit | Attending: Interventional Radiology | Admitting: Interventional Radiology

## 2023-03-13 DIAGNOSIS — N289 Disorder of kidney and ureter, unspecified: Secondary | ICD-10-CM

## 2023-03-13 DIAGNOSIS — Z85528 Personal history of other malignant neoplasm of kidney: Secondary | ICD-10-CM | POA: Diagnosis not present

## 2023-03-13 DIAGNOSIS — N2889 Other specified disorders of kidney and ureter: Secondary | ICD-10-CM | POA: Diagnosis not present

## 2023-03-13 MED ORDER — GADOPICLENOL 0.5 MMOL/ML IV SOLN
9.0000 mL | Freq: Once | INTRAVENOUS | Status: AC | PRN
  Administered 2023-03-13: 9 mL via INTRAVENOUS

## 2023-03-15 ENCOUNTER — Ambulatory Visit (HOSPITAL_COMMUNITY): Payer: Medicare Other | Attending: Cardiology

## 2023-03-15 DIAGNOSIS — I272 Pulmonary hypertension, unspecified: Secondary | ICD-10-CM

## 2023-03-15 DIAGNOSIS — Z79899 Other long term (current) drug therapy: Secondary | ICD-10-CM | POA: Diagnosis not present

## 2023-03-15 LAB — ECHOCARDIOGRAM COMPLETE
Area-P 1/2: 6.96 cm2
P 1/2 time: 575 msec
S' Lateral: 2.9 cm

## 2023-03-16 ENCOUNTER — Encounter: Payer: Self-pay | Admitting: Cardiology

## 2023-03-18 ENCOUNTER — Telehealth: Payer: Self-pay

## 2023-03-18 NOTE — Telephone Encounter (Signed)
-----   Message from Loa Socks, LPN sent at 12/28/8117 11:26 AM EDT -----  ----- Message ----- From: Quintella Reichert, MD Sent: 03/16/2023   8:42 AM EDT To: Lise Auer, MD; [address] Triage  Normal heart function with mildly thickened heart muscle due to hypertension.  No evidence of pulmonary hypertension.  Mildly leaky aortic valve and mitral valve and mildly enlarged ascending aorta at 43 mm.  Repeat chest CTA gated to reassess aorta

## 2023-03-18 NOTE — Telephone Encounter (Signed)
Called patient to discuss Echo results, no answer. Left message with no identifiers asking recipient to call our office.

## 2023-03-19 ENCOUNTER — Telehealth: Payer: Self-pay | Admitting: Cardiology

## 2023-03-19 NOTE — Telephone Encounter (Signed)
Christina Reichert, MD 03/16/2023  8:42 AM EDT     Normal heart function with mildly thickened heart muscle due to hypertension.  No evidence of pulmonary hypertension.  Mildly leaky aortic valve and mitral valve and mildly enlarged ascending aorta at 43 mm.  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 She would like to know if [facility] Knife would be okay with an MRA in place of CTA. Routing to MD.

## 2023-03-19 NOTE — Telephone Encounter (Signed)
Pt calling back regarding results.  Please advise

## 2023-03-19 NOTE — Telephone Encounter (Signed)
Christina Reichert, MD  Lars Mage, RN Caller: Unspecified (Today,  8:22 AM) Who is her nephrologist - MRI/MRA requires contrast  Returned call to patient, but had to leave message asking that she call us back, no DPR permission on file. Will review above once return call is received.

## 2023-03-22 NOTE — Telephone Encounter (Signed)
Kidney doctor is  Dr. Liliane Shi at [facility].  She sees him this Friday.    Explained she will still need contrast w MRI/MRA and she thinks that that recommendation was to use MRI instead of CT due to less contrast.

## 2023-03-22 NOTE — Telephone Encounter (Signed)
Patient is returning call and is requesting return call.  

## 2023-03-26 ENCOUNTER — Encounter: Payer: Self-pay | Admitting: Cardiology

## 2023-03-26 DIAGNOSIS — I77819 Aortic ectasia, unspecified site: Secondary | ICD-10-CM

## 2023-03-26 DIAGNOSIS — C641 Malignant neoplasm of right kidney, except renal pelvis: Secondary | ICD-10-CM | POA: Diagnosis not present

## 2023-03-26 DIAGNOSIS — D49512 Neoplasm of unspecified behavior of left kidney: Secondary | ICD-10-CM | POA: Diagnosis not present

## 2023-03-27 LAB — LAB REPORT - SCANNED: EGFR: 51.2

## 2023-04-02 ENCOUNTER — Ambulatory Visit: Admission: RE | Admit: 2023-04-02 | Payer: Medicare Other | Source: Ambulatory Visit

## 2023-04-02 DIAGNOSIS — Z1331 Encounter for screening for depression: Secondary | ICD-10-CM | POA: Diagnosis not present

## 2023-04-02 DIAGNOSIS — M5432 Sciatica, left side: Secondary | ICD-10-CM | POA: Diagnosis not present

## 2023-04-02 DIAGNOSIS — R7309 Other abnormal glucose: Secondary | ICD-10-CM | POA: Diagnosis not present

## 2023-04-02 DIAGNOSIS — M1990 Unspecified osteoarthritis, unspecified site: Secondary | ICD-10-CM | POA: Diagnosis not present

## 2023-04-02 DIAGNOSIS — N2889 Other specified disorders of kidney and ureter: Secondary | ICD-10-CM | POA: Diagnosis not present

## 2023-04-02 DIAGNOSIS — Z0000 Encounter for general adult medical examination without abnormal findings: Secondary | ICD-10-CM | POA: Diagnosis not present

## 2023-04-02 DIAGNOSIS — N289 Disorder of kidney and ureter, unspecified: Secondary | ICD-10-CM

## 2023-04-02 HISTORY — PX: IR RADIOLOGIST EVAL & MGMT: IMG5224

## 2023-04-02 NOTE — Telephone Encounter (Signed)
Call to Dr. Alphonsa Overall office, was on hold to talk to his nurse for 30 mins. Will try their office again. Also sent message via Epic.

## 2023-04-03 NOTE — Progress Notes (Signed)
Chief Complaint: Patient was consulted remotely today (TeleHealth) for follow-up of left renal lesions.   History of Present Illness: Christina [name] is a 72 y.o. 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 At the time of detection of the right renal carcinoma, 2 small left renal lesions were also detected initially by CT emanating from the mid to lower pole with a partially exophytic lateral cortical lesion demonstrating partially cystic characteristics by unenhanced imaging and demonstrating probable partial internal enhancement with maximal diameter of approximately 1.2 cm by CT and an additional posterior cortical lesion which is endophytic and partially cystic demonstrating probable internal enhancement and measuring approximately 1.3 cm in greatest diameter by CT.  Both of these lesions were subsequently followed by MRI of the abdomen on 07/25/2021 and demonstrated no growth with more accurate diameter estimate of approximately 10 mm each and demonstrating partial internal enhancement. As the small left renal cortical lesions demonstrated no growth in a 57-month interval between CT and MRI studies, decision was made at the time of prior consultation to perform interval surveillance imaging. Lesions demonstrated stability on MRI last year and a follow up MRI has now been performed this year. She remains asymptomatic.  Past Medical History:  Diagnosis Date   Acquired dilation of ascending aorta and aortic root (HCC)    4cm upper normal on Chest CT 09/2020.  43 mm by 2D echo 03/11/2023   Arthritis    PAIN AND OA RIGHT KNEE; S/P LEFT TOTAL KNEE REPLACEMENT 05-07-14 - STILL IN PHYSICAL THERAPY   Asthma    hx of - PT STATES MILD - NO LONGER REQUIRES INHALERS   Cancer (HCC)    renal cell carcinoma   CHF (congestive heart failure) (HCC)    Colitis    mild    GERD (gastroesophageal reflux disease)    H/O hiatal hernia    Hypertension    hx  of 10 years ago ; PT'S BLOOD PRESSURE RECENTLY ELEVATED WHILE IN HOSP FOR SURG- NOT ON ANY B/P MEDS   Hyperthyroidism    PAF (paroxysmal atrial fibrillation) (HCC)    Pulmonary hypertension (HCC)    PASP 45 mmHg by echo 01/2022.  Normal PA pressures on echo 03/11/2023    Past Surgical History:  Procedure Laterality Date   IR RADIOLOGIST EVAL & MGMT  08/28/2021   IR RADIOLOGIST EVAL & MGMT  03/10/2022   KNEE CLOSED REDUCTION Left 06/05/2014   Procedure: CLOSED MANIPULATION KNEE;  Surgeon: Shelda Pal, MD;  Location: [facility];  Service: Orthopedics;  Laterality: Left;   RIGHT/LEFT HEART CATH AND CORONARY ANGIOGRAPHY N/A 05/31/2019   Procedure: RIGHT/LEFT HEART CATH AND CORONARY ANGIOGRAPHY;  Surgeon: [place], Peter M, MD;  Location: [facility] INVASIVE CV LAB;  Service: Cardiovascular;  Laterality: N/A;   ROBOTIC ASSITED PARTIAL NEPHRECTOMY Right 03/07/2021   Procedure: XI ROBOTIC ASSITED RADICAL NEPHRECTOMY;  Surgeon: Rene Paci, MD;  Location: [facility];  Service: Urology;  Laterality: Right;   TOTAL KNEE ARTHROPLASTY Left 05/07/2014   Procedure: LEFT TOTAL KNEE ARTHROPLASTY;  Surgeon: Shelda Pal, MD;  Location: [facility];  Service: Orthopedics;  Laterality: Left;   TOTAL KNEE ARTHROPLASTY Right 06/05/2014   Procedure: RIGHT TOTAL KNEE ARTHROPLASTY;  Surgeon: Shelda Pal, MD;  Location: [facility];  Service: Orthopedics;  Laterality: Right;   WISDOM TEETH EXTRACTIONS      Allergies: Hydrochlorothiazide, Hydrocodone-acetaminophen, and Other  Medications: Prior to Admission medications   Medication  Sig Start Date End Date Taking? Authorizing Provider  acetaminophen (TYLENOL) 650 MG CR tablet Take 1,300 mg by mouth in the morning. Take 2 tablets (1300 mg) by mouth scheduled every morning & may take an additional dose (1300 mg) in the evening/afternoon if needed for pain.    [provider]  adapalene (DIFFERIN) 0.1 % gel Apply 1 application topically at bedtime.    [provider]  amlodipine-benazepril (LOTREL) 2.5-10 MG capsule Take 1 capsule by mouth daily. 02/02/23   Quintella Reichert, MD  amoxicillin (AMOXIL) 500 MG capsule as directed. Dental visits only 04/03/21   [provider]  APPLE CIDER VINEGAR PO Take 1 tablet by mouth daily. 1200 mg    [provider]  carboxymethylcellulose (REFRESH PLUS) 0.5 % SOLN Place 1-2 drops into both eyes 3 (three) times daily as needed (dry/irritated eyes.).    [provider]  Dapsone 5 % topical gel Apply 1 application topically in the morning. Apply to the face daily as directed    [provider]  Flaxseed, Linseed, (FLAX SEED OIL PO) Take 1 tablet by mouth daily. 720 mg    [provider]  fluticasone (FLONASE) 50 MCG/ACT nasal spray Place into both nostrils as needed for allergies or rhinitis.    [provider]  furosemide (LASIX) 20 MG tablet Take 1 tablet (20 mg total) by mouth daily. 02/02/23   Quintella Reichert, MD  ibuprofen (ADVIL) 200 MG tablet Take 200 mg by mouth in the morning and at bedtime.    [provider]  Lidocaine-Menthol (LIDOCAINE-MENTHOL ROLL-ON) 4-1 % LIQD Apply 1 application  topically 3 (three) times daily as needed (leg pain).    [provider]  loratadine (CLARITIN) 10 MG tablet Take 10 mg by mouth in the morning.    [provider]  LUTEIN PO Take 1 tablet by mouth daily. 25 mg    [provider]  methimazole (TAPAZOLE) 5 MG tablet Take 2 tablets in the morning 03/03/23   Reather Littler, MD  metoprolol tartrate (LOPRESSOR) 25 MG tablet TAKE 1 TABLET BY MOUTH ONCE DAILY AS NEEDED FOR  PALPITATIONS 03/05/23   Quintella Reichert, MD  selenium 50 MCG TABS tablet Take 200 mcg by mouth daily.    [provider]     Family History  Problem Relation Age of Onset   Hypothyroidism Mother    Graves' disease Sister    Diabetes Brother    Diabetes Maternal Aunt     Social History   Socioeconomic History    Marital status: Single    Spouse name: Not on file   Number of children: Not on file   Years of education: Not on file   Highest education level: Not on file  Occupational History   Not on file  Tobacco Use   Smoking status: Never   Smokeless tobacco: Never  Vaping Use   Vaping status: Never Used  Substance and Sexual Activity   Alcohol use: No   Drug use: No   Sexual activity: Not Currently  Other Topics Concern   Not on file  Social History Narrative   Not on file   Social Determinants of Health   Financial Resource Strain: Not on file  Food Insecurity: Not on file  Transportation Needs: Not on file  Physical Activity: Not on file  Stress: Not on file  Social Connections: Not on file     Review of Systems  Constitutional: Negative.  Respiratory: Negative.    Cardiovascular: Negative.   Gastrointestinal: Negative.   Genitourinary: Negative.   Musculoskeletal: Negative.   Neurological: Negative.     Review of Systems: A 12 point ROS discussed and pertinent positives are indicated in the HPI above.  All other systems are negative.   Physical Exam No direct physical exam was performed (except for noted visual exam findings with Video Visits).    Vital Signs: There were no vitals taken for this visit.  Imaging: MR ABDOMEN WWO CONTRAST  Result Date: 03/17/2023 CLINICAL DATA:  Follow-up left renal masses. Previous right nephrectomy for renal cell carcinoma. EXAM: MRI ABDOMEN WITHOUT AND WITH CONTRAST TECHNIQUE: Multiplanar multisequence MR imaging of the abdomen was performed both before and after the administration of intravenous contrast. CONTRAST:  9 mL Vueway COMPARISON:  03/04/2022 FINDINGS: Lower chest: No acute findings. Hepatobiliary: No suspicious hepatic masses identified. Sub-cm cyst again noted in the posterior liver dome. Gallbladder is unremarkable. No evidence of biliary ductal dilatation. Pancreas:  No mass or inflammatory changes. Spleen:  Within  normal limits in size and appearance. Adrenals/Urinary Tract: Prior right nephrectomy again noted. 9 mm lesion in the posterior interpolar region of the left kidney, and 9 mm subcapsular lesion in the lateral interpolar region of the left kidney are both stable. These both show T2 hypointensity and evidence of contrast enhancement on subtraction imaging, suspicious for small renal cell carcinomas. No new or enlarging renal mass identified. No evidence of hydronephrosis. Stomach/Bowel: Unremarkable. Vascular/Lymphatic: No pathologically enlarged lymph nodes identified. No acute vascular findings. Other:  None. Musculoskeletal:  No suspicious bone lesions identified. 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 Prior right nephrectomy. No evidence of abdominal metastatic disease. Electronically Signed   By: Danae Orleans M.D.   On: 03/17/2023 13:05   ECHOCARDIOGRAM COMPLETE  Result Date: 03/15/2023    ECHOCARDIOGRAM REPORT   Patient Name:   Christina [name]   Date of Exam: 03/15/2023 Medical Rec #:  [id number]     Height:       62.0 in Accession #:    [id number]    Weight:       196.0 lb Date of Birth:  10-04-1950    BSA:          1.895 m Patient Age:    37 years      BP:           144/88 mmHg Patient Gender: F             HR:           88 bpm. Exam Location:  [address] Procedure: 2D Echo, Cardiac Doppler and Color Doppler Indications:    I27.20 Pulmonary Hypertension  History:        Patient has prior history of Echocardiogram examinations, most                 recent 01/27/2022. CHF, Arrythmias:Atrial Fibrillation; Risk                 Factors:Hypertension. Asthma. Acquired dilation of ascending                 aorta and aortic root.  Sonographer:    Cathie Beams RCS Referring Phys: Cornelious Bryant TURNER IMPRESSIONS  1. Left ventricular ejection fraction, by estimation, is 60 to 65%. The left ventricle has normal function. The left  ventricle has no regional wall motion abnormalities. There is mild  left ventricular hypertrophy. Left ventricular diastolic parameters are indeterminate.  2. Right ventricular systolic function is normal. The right ventricular size is normal. There is normal pulmonary artery systolic pressure. The estimated right ventricular systolic pressure is 34.4 mmHg.  3. The mitral valve is normal in structure. Mild mitral valve regurgitation.  4. The aortic valve is tricuspid. Aortic valve regurgitation is mild. No aortic stenosis is present.  5. Aortic dilatation noted. There is dilatation of the ascending aorta, measuring 43 mm.  6. The inferior vena cava is dilated in size with >50% respiratory variability, suggesting right atrial pressure of 8 mmHg. FINDINGS  Left Ventricle: Left ventricular ejection fraction, by estimation, is 60 to 65%. The left ventricle has normal function. The left ventricle has no regional wall motion abnormalities. The left ventricular internal cavity size was normal in size. There is  mild left ventricular hypertrophy. Left ventricular diastolic parameters are indeterminate. Right Ventricle: The right ventricular size is normal. No increase in right ventricular wall thickness. Right ventricular systolic function is normal. There is normal pulmonary artery systolic pressure. The tricuspid regurgitant velocity is 2.57 m/s, and  with an assumed right atrial pressure of 8 mmHg, the estimated right ventricular systolic pressure is 34.4 mmHg. Left Atrium: Left atrial size was normal in size. Right Atrium: Right atrial size was normal in size. Pericardium: There is no evidence of pericardial effusion. Mitral Valve: The mitral valve is normal in structure. Mild mitral valve regurgitation. Tricuspid Valve: The tricuspid valve is normal in structure. Tricuspid valve regurgitation is mild. Aortic Valve: The aortic valve is tricuspid. Aortic valve regurgitation is mild. Aortic regurgitation PHT measures 575  msec. No aortic stenosis is present. Pulmonic Valve: The pulmonic valve was not well visualized. Pulmonic valve regurgitation is trivial. Aorta: The aortic root is normal in size and structure and aortic dilatation noted. There is dilatation of the ascending aorta, measuring 43 mm. Venous: The inferior vena cava is dilated in size with greater than 50% respiratory variability, suggesting right atrial pressure of 8 mmHg. IAS/Shunts: The interatrial septum was not well visualized.  LEFT VENTRICLE PLAX 2D LVIDd:         4.20 cm   Diastology LVIDs:         2.90 cm   LV e' medial:    6.85 cm/s LV PW:         1.00 cm   LV E/e' medial:  17.8 LV IVS:        1.10 cm   LV e' lateral:   9.57 cm/s LVOT diam:     2.00 cm   LV E/e' lateral: 12.7 LV SV:         68 LV SV Index:   36 LVOT Area:     3.14 cm  RIGHT VENTRICLE RV Basal diam:  2.00 cm RV Mid diam:    1.40 cm RV S prime:     9.79 cm/s TAPSE (M-mode): 2.1 cm RVSP:           29.4 mmHg LEFT ATRIUM             Index        RIGHT ATRIUM           Index LA diam:        3.30 cm 1.74 cm/m   RA Pressure: 3.00 mmHg LA Vol (A2C):   51.2 ml 27.01 ml/m  RA Area:     13.10 cm LA Vol (A4C):   55.9 ml 29.49 ml/m  RA Volume:   25.40 ml  13.40 ml/m LA Biplane Vol: 54.1 ml 28.54 ml/m  AORTIC VALVE LVOT Vmax:   96.50 cm/s LVOT Vmean:  60.300 cm/s LVOT VTI:    0.218 m AI PHT:      575 msec  AORTA Ao Root diam: 3.40 cm Ao Asc diam:  4.30 cm MITRAL VALVE                TRICUSPID VALVE MV Area (PHT): 6.96 cm     TR Peak grad:   26.4 mmHg MV Decel Time: 109 msec     TR Vmax:        257.00 cm/s MV E velocity: 122.00 cm/s  Estimated RAP:  3.00 mmHg MV A velocity: 152.00 cm/s  RVSP:           29.4 mmHg MV E/A ratio:  0.80                             SHUNTS                             Systemic VTI:  0.22 m                             Systemic Diam: 2.00 cm Epifanio Lesches MD Electronically signed by Epifanio Lesches MD Signature Date/Time: 03/15/2023/4:06:33 PM    Final      Labs:  CBC: Recent Labs    04/20/22 1653 09/01/22 1443 03/01/23 1437  WBC 5.5 5.9 6.4  HGB 13.3 14.2 13.9  HCT 40.4 42.1 43.5  PLT 224.0 269.0 309.0    COAGS: No results for input(s): "INR", "APTT" in the last 8760 hours.  BMP: Recent Labs    02/02/23 1611  NA 141  K 4.8  CL 102  CO2 25  GLUCOSE 89  BUN 25  CALCIUM 9.7  CREATININE 1.09*    LIVER FUNCTION TESTS: Recent Labs    04/20/22 1653  ALT 10    Assessment and Plan:  I spoke with Christina [name] by phone. The follow up MRI on 03/13/23 demonstrates stable, small, 9 mm posterior and lateral left interpolar cortical renal lesions demonstrating partial cystic characteristics and mild enhancement. These show no growth in over two years. I recommended another follow up MRI in one year. She is in agreement to continue surveillance.   Electronically Signed: Reola Calkins 04/03/2023, 10:58 AM    I spent a total of 10 Minutes in remote  clinical consultation, greater than 50% of which was counseling/coordinating care for surveillance of left renal lesions.    Visit type: Audio only (telephone). Audio (no video) only due to patient's lack of internet/smartphone capability. Alternative for in-person consultation at [facility], [address], [place], [place]. This visit type was conducted due to national recommendations for restrictions regarding the COVID-19 Pandemic (e.g. social distancing).  This format is felt to be most appropriate for this patient at this time.  All issues noted in this document were discussed and addressed.

## 2023-04-07 NOTE — Telephone Encounter (Signed)
Spoke to Orin, Engineer, materials. Request to discuss contrast Dr. Liliane Shi is on vacation right now returning Monday 04/12/23 but she will forward Dr. Norris Cross question to Dr. Varney Biles MD and fax [place] a response.

## 2023-04-07 NOTE — Telephone Encounter (Signed)
Call to Dr. Alphonsa Overall office:  [facility] [address] [place] [place] [address] [place], [place] [address] [phone]  No answer, unable to leave message.

## 2023-04-13 ENCOUNTER — Telehealth: Payer: Self-pay

## 2023-04-13 NOTE — Telephone Encounter (Signed)
Call to [facility] to ask for recommendations in writing regarding CTA w/ contrast of aorta that is scheduled 04/22/23. Spoke to [employer] states she thinks the letter was sent the day of the crowdstrike outage. Asked if she would send again, Vernona Rieger agrees.

## 2023-04-16 ENCOUNTER — Telehealth: Payer: Self-pay

## 2023-04-16 DIAGNOSIS — I77819 Aortic ectasia, unspecified site: Secondary | ICD-10-CM

## 2023-04-16 NOTE — Telephone Encounter (Signed)
-----   Message from Armanda Magic sent at 04/13/2023  5:25 PM EDT ----- OK he states that MRI MRA of chest is preferable so cancel chest CTA and get a Chest MRI MRA with gad instead ----- Message ----- From: Luellen Pucker, RN Sent: 04/13/2023   2:08 PM EDT To: Quintella Reichert, MD  This is the documentation we asked for from Dr. Rhoderick Moody. This may be difficult to read, I have a physical copy on your desk. I can try to fax or email to you if it's unreadable. Looks like on the first page he says he prefers an MRA but a CT with contrast is ok if "absolutely necessary". Alcario Drought

## 2023-04-16 NOTE — Telephone Encounter (Signed)
Call to patient to explain that Dr. Mayford Knife and Dr. Liliane Shi have communicated and feel patient should forego the CTA with contrast for dilated aorta and proceed with MRI/MRA of chest with gadnolinium instead. Patient verbalizes understanding and agrees to plan. Order placed, CTA order canceled.

## 2023-04-20 ENCOUNTER — Encounter: Payer: Self-pay | Admitting: Cardiology

## 2023-04-22 ENCOUNTER — Ambulatory Visit (HOSPITAL_COMMUNITY): Payer: Medicare Other

## 2023-04-29 ENCOUNTER — Ambulatory Visit (INDEPENDENT_AMBULATORY_CARE_PROVIDER_SITE_OTHER): Payer: Medicare Other | Admitting: Endocrinology

## 2023-04-29 ENCOUNTER — Encounter: Payer: Self-pay | Admitting: Endocrinology

## 2023-04-29 VITALS — BP 128/82 | HR 68 | Ht 62.0 in | Wt 203.0 lb

## 2023-04-29 DIAGNOSIS — I10 Essential (primary) hypertension: Secondary | ICD-10-CM

## 2023-04-29 DIAGNOSIS — E0590 Thyrotoxicosis, unspecified without thyrotoxic crisis or storm: Secondary | ICD-10-CM | POA: Diagnosis not present

## 2023-04-29 LAB — COMPREHENSIVE METABOLIC PANEL
ALT: 11 U/L (ref 0–35)
AST: 10 U/L (ref 0–37)
Albumin: 4.2 g/dL (ref 3.5–5.2)
Alkaline Phosphatase: 135 U/L — ABNORMAL HIGH (ref 39–117)
BUN: 18 mg/dL (ref 6–23)
CO2: 27 mEq/L (ref 19–32)
Calcium: 9.8 mg/dL (ref 8.4–10.5)
Chloride: 104 mEq/L (ref 96–112)
Creatinine, Ser: 1.11 mg/dL (ref 0.40–1.20)
GFR: 50.67 mL/min — ABNORMAL LOW (ref >60.00)
Glucose, Bld: 83 mg/dL (ref 70–99)
Potassium: 5.3 mEq/L — ABNORMAL HIGH (ref 3.5–5.1)
Sodium: 138 mEq/L (ref 135–145)
Total Bilirubin: 0.5 mg/dL (ref 0.2–1.2)
Total Protein: 6.5 g/dL (ref 6.0–8.3)

## 2023-04-29 LAB — T4, FREE: Free T4: 0.99 ng/dL (ref 0.60–1.60)

## 2023-04-29 LAB — T3, FREE: T3, Free: 3.2 pg/mL (ref 2.3–4.2)

## 2023-04-29 LAB — TSH: TSH: 0.01 u[IU]/mL — ABNORMAL LOW (ref 0.35–5.50)

## 2023-04-29 NOTE — Progress Notes (Signed)
Patient ID: Christina [name], female   DOB: May 09, 1951, 72 y.o.   MRN: [id number]                                                                                                               Reason for Appointment:  Hyperthyroidism, follow-up visit   Chief complaint: Follow-up of thyroid   History of Present Illness:   Prior history: For the last several years the patient has had symptoms of occasional palpitations, shortness of breath on exertion, shakiness and feeling somewhat warm.  However she did not have any recent worsening of the symptoms except on the morning of hospitalization She previously did not have any unusual weight loss, anxiety or fatigue She was also having some swelling of her legs prior to admission Since she was having difficulties with tachycardia and atrial fibrillation her thyroid level was checked in the hospital and indicated hyperthyroidism  RECENT HISTORY:  Baseline symptoms before treatment were occasional palpitations, shortness of breath on exertion, shakiness and feeling warm She has Graves' disease confirmed with a baseline thyrotropin receptor antibody level of 3.9  She has been treated with methimazole since 05/2019  Her dose has between 5 and 12.5 mg usually  She has required periodic adjustments of her Methimazole dosage   She is currently taking methimazole dose 1-1/2 tablets in the morning and 5 mg in the evening; the dose was increased since 6/24 when her free T4 was high  She did not feel any different with increasing the dose and prior to the change she was not having any typical symptoms of hyperthyroidism Did have some weight loss previously and now has gained weight compared to her last visit She does feel better but this is likely to be from her taking Celebrex and gabapentin for other issues  She takes methimazole regularly  Thyroid levels pending from today  Last thyrotropin receptor antibody in 12/23 was 15  Wt Readings from Last  3 Encounters:  04/29/23 203 lb (92.1 kg)  03/01/23 196 lb (88.9 kg)  02/02/23 200 lb 6.4 oz (90.9 kg)   Thyroid function tests as follows:     Lab Results  Component Value Date   FREET4 1.65 (H) 03/01/2023   FREET4 1.08 12/14/2022   FREET4 0.78 09/01/2022   T3FREE 3.9 03/01/2023   T3FREE 3.0 12/14/2022   T3FREE 3.5 09/01/2022   TSH <0.01 (L) 03/01/2023   TSH 0.03 (L) 12/14/2022   TSH 0.03 (L) 09/01/2022    Lab Results  Component Value Date   THYROTRECAB 15.00 (H) 09/01/2022   THYROTRECAB 26.60 (H) 04/20/2022   THYROTRECAB 149.00 (H) 09/29/2021   OPHTHALMOPATHY:  Since about 11/20 she was having more swelling of her eyes, some double vision, and a pressure sensation Subsequently in December she had more fuzzy vision, feeling of fullness in her eyes and continued double vision She went to the urgent care center and was given a Medrol Dosepak  With the recurrence of the symptoms in 1/21 she was again started on tapering dose  of prednisone Has been treated by oculoplastic ophthalmologist and she had been on Medrol high-dose weekly injections started in late February with only mild and short lasting relief  She had a complete the course of Tepezza  infusions  This improved her vision and has reduced the swelling and fullness in her eyes  She had another round of infusions in 2023 but had to stop after the fourth infusion because of tinnitus, last infusion was 02/27/22  No recent problems with excessive prominence or swelling, has had only some issues with allergies  She uses refresh drops for dryness  CT scan of orbits has not shown any proptosis and only thickening of the eye muscles which has improved    Allergies as of 04/29/2023       Reactions   Hydrochlorothiazide Other (See Comments), Hypertension   Wildly fluctuates B/P low-to-high, then high-to-low, in a matter of minutes   Hydrocodone-acetaminophen Nausea And Vomiting   Patient DOES NOT wish to take EVER  again...   Other Nausea And Vomiting, Other (See Comments)   Stronger pain meds (post-op) caused dehydration and constipation        Medication List        Accurate as of April 29, 2023  9:56 AM. If you have any questions, ask your nurse or doctor.          STOP taking these medications    carboxymethylcellulose 0.5 % Soln Commonly known as: REFRESH PLUS Stopped by: Reather Littler   ibuprofen 200 MG tablet Commonly known as: ADVIL Stopped by: Reather Littler       TAKE these medications    acetaminophen 650 MG CR tablet Commonly known as: TYLENOL Take 1,300 mg by mouth in the morning. Take 2 tablets (1300 mg) by mouth scheduled every morning & may take an additional dose (1300 mg) in the evening/afternoon if needed for pain.   adapalene 0.1 % gel Commonly known as: DIFFERIN Apply 1 application topically at bedtime.   amlodipine-benazepril 2.5-10 MG capsule Commonly known as: LOTREL Take 1 capsule by mouth daily.   amoxicillin 500 MG capsule Commonly known as: AMOXIL as directed. Dental visits only   APPLE CIDER VINEGAR PO Take 1 tablet by mouth daily. 1200 mg   celecoxib 200 MG capsule Commonly known as: CELEBREX Take 200 mg by mouth daily as needed.   Dapsone 5 % topical gel Apply 1 application topically in the morning. Apply to the face daily as directed   FLAX SEED OIL PO Take 1 tablet by mouth daily. 720 mg   fluticasone 50 MCG/ACT nasal spray Commonly known as: FLONASE Place into both nostrils as needed for allergies or rhinitis.   furosemide 20 MG tablet Commonly known as: LASIX Take 1 tablet (20 mg total) by mouth daily.   gabapentin 100 MG capsule Commonly known as: NEURONTIN Take 100-200 mg by mouth at bedtime as needed.   Lidocaine-Menthol Roll-On 4-1 % Liqd Generic drug: Lidocaine-Menthol Apply 1 application  topically 3 (three) times daily as needed (leg pain).   loratadine 10 MG tablet Commonly known as: CLARITIN Take 10 mg by mouth  in the morning.   LUTEIN PO Take 1 tablet by mouth daily. 25 mg   methimazole 5 MG tablet Commonly known as: TAPAZOLE Take 2 tablets in the morning   metoprolol tartrate 25 MG tablet Commonly known as: LOPRESSOR TAKE 1 TABLET BY MOUTH ONCE DAILY AS NEEDED FOR  PALPITATIONS   selenium 50 MCG Tabs tablet Take 200 mcg by  mouth daily.            Past Medical History:  Diagnosis Date   Acquired dilation of ascending aorta and aortic root (HCC)    4cm upper normal on Chest CT 09/2020.  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  Normal PA pressures on echo 03/11/2023    Past Surgical History:  Procedure Laterality Date   IR RADIOLOGIST EVAL & MGMT  08/28/2021   IR RADIOLOGIST EVAL & MGMT  03/10/2022   IR RADIOLOGIST EVAL & MGMT  04/02/2023   KNEE CLOSED REDUCTION Left 06/05/2014   Procedure: CLOSED MANIPULATION KNEE;  Surgeon: Shelda Pal, MD;  Location: [facility];  Service: Orthopedics;  Laterality: Left;   RIGHT/LEFT HEART CATH AND CORONARY ANGIOGRAPHY N/A 05/31/2019   Procedure: RIGHT/LEFT HEART CATH AND CORONARY ANGIOGRAPHY;  Surgeon: [place]az[place], Peter M, MD;  Location: [facility] INVASIVE CV LAB;  Service: Cardiovascular;  Laterality: N/A;   ROBOTIC ASSITED PARTIAL NEPHRECTOMY Right 03/07/2021   Procedure: XI ROBOTIC ASSITED RADICAL NEPHRECTOMY;  Surgeon: Rene Paci, MD;  Location: [facility];  Service: Urology;   Laterality: Right;   TOTAL KNEE ARTHROPLASTY Left 05/07/2014   Procedure: LEFT TOTAL KNEE ARTHROPLASTY;  Surgeon: Shelda Pal, MD;  Location: [facility];  Service: Orthopedics;  Laterality: Left;   TOTAL KNEE ARTHROPLASTY Right 06/05/2014   Procedure: RIGHT TOTAL KNEE ARTHROPLASTY;  Surgeon: Shelda Pal, MD;  Location: [facility];  Service: Orthopedics;  Laterality: Right;   WISDOM TEETH EXTRACTIONS      Family History  Problem Relation Age of Onset   Hypothyroidism Mother    Luiz Blare' disease Sister    Diabetes Brother    Diabetes Maternal Aunt     Social History:  reports that she has never smoked. She has never used smokeless tobacco. She reports that she does not drink alcohol and does not use drugs.  Allergies:  Allergies  Allergen Reactions   Hydrochlorothiazide Other (See Comments) and Hypertension    Wildly fluctuates B/P low-to-high, then high-to-low, in a matter of minutes   Hydrocodone-Acetaminophen Nausea And Vomiting    Patient DOES NOT wish to take EVER again...   Other Nausea And Vomiting and Other (See Comments)    Stronger pain meds (post-op) caused dehydration and constipation     Review of Systems  Essential hypertension: Treated by cardiologist with Lotrel Not monitoring at home  BP Readings from Last 3 Encounters:  03/01/23 (!) 142/88  02/02/23 (!) 144/88  12/14/22 128/80      Examination:   Ht 5\' 2"  (1.575 m)   Wt 203 lb (92.1 kg)   BMI 37.13 kg/m   Thyroid not palpable No tremor Biceps reflexes show normal relaxation   Assessment/Plan:   Hyperthyroidism, from  Graves' disease with baseline thyrotropin receptor antibody of 3.9  She has been on methimazole since early September 2020   She has had frequent adjustments of her methimazole dose although requiring slightly smaller doses lately She is currently taking a total of 12.5 mg daily, dose was increased 2 months ago  Not a candidate for I-131 treatment because of her severe  ophthalmopathy history  She is complaining of fatigue although this is less Has gained weight since last visit However no symptoms of hypothyroidism  Thyroid levels to be checked today for dosage adjustment She is still reluctant to consider surgery for her hyperthyroidism  Will also check her chemistry labs as requested by her PCP for recent use of Celebrex  Follow-up in 2 months     04/29/2023, 9:56 AM    Note: This office note was prepared with Dragon voice recognition system technology. Any transcriptional errors that result from this process are unintentional.

## 2023-04-29 NOTE — Telephone Encounter (Signed)
Patient wants to cancel current MRI which is not open and reschedule for MRI which is open at [facility].

## 2023-05-03 ENCOUNTER — Telehealth: Payer: Self-pay

## 2023-05-03 NOTE — Patient Outreach (Signed)
  Care Coordination   Initial Visit Note   05/03/2023 Name: Christina [name] MRN: [id number] DOB: [birth year]  Christina [name] is a 72 y.o. year old female who sees Lise Auer, MD for primary care. I spoke with  Christina [name] by phone today.  What matters to the patients health and wellness today?  Placed call to patient today to review and offer [facility] care coordination program. Patient reports that she is self managing well and denies any needs at this time.    SDOH assessments and interventions completed:  No     Care Coordination Interventions:  No, not indicated   Follow up plan: No further intervention required.   Encounter Outcome:  Pt. Refused   Rowe Pavy, RN, BSN, CEN [facility] [employer] [facility] [phone]

## 2023-05-04 ENCOUNTER — Telehealth: Payer: Self-pay

## 2023-05-04 DIAGNOSIS — I77819 Aortic ectasia, unspecified site: Secondary | ICD-10-CM

## 2023-05-04 DIAGNOSIS — I77810 Thoracic aortic ectasia: Secondary | ICD-10-CM

## 2023-05-05 ENCOUNTER — Telehealth: Payer: Self-pay

## 2023-05-05 NOTE — Telephone Encounter (Signed)
Patient called wanting to know if she need to make any medication changes. She saw her lab work on [facility]. Please Advise,

## 2023-05-05 NOTE — Telephone Encounter (Signed)
Called and spoke to [place] at [facility] regarding orders for open MRI for chest MRI/MRA. Su Ley is able to see orders and states their MRI is only open at the head and foot. Called patient to verify this is acceptable to her, patient states she has had MRI's at this location before and is comfortable with this set up. Left message for Su Ley advising that patient is ready to schedule chest MRI/MRA.

## 2023-05-10 ENCOUNTER — Telehealth: Payer: Self-pay

## 2023-05-10 ENCOUNTER — Ambulatory Visit (HOSPITAL_COMMUNITY): Admission: RE | Admit: 2023-05-10 | Payer: Medicare Other | Source: Ambulatory Visit

## 2023-05-10 NOTE — Telephone Encounter (Signed)
Called patient to check to see if Chest MRA had been scheduled, patient states she has not yet received a call from [facility]. Reviewed open MRI services at [facility], patient states she has had an MRI there before and is comfortable with their set up. Advised patient to call [facility] and ask to schedule as I have sent corrected orders to [facility] and verbally confirmed they were received. Patient agrees to plan.

## 2023-05-10 NOTE — Telephone Encounter (Signed)
-----   Message from Nurse Alcario Drought E sent at 05/05/2023  3:51 PM EDT ----- Check on mra at [facility]

## 2023-06-23 ENCOUNTER — Ambulatory Visit
Admission: RE | Admit: 2023-06-23 | Discharge: 2023-06-23 | Disposition: A | Discharge status: Home / Self Care | Payer: Medicare Other | Source: Ambulatory Visit | Attending: Cardiology | Admitting: Cardiology

## 2023-06-23 DIAGNOSIS — I7121 Aneurysm of the ascending aorta, without rupture: Secondary | ICD-10-CM | POA: Diagnosis not present

## 2023-06-23 DIAGNOSIS — I77810 Thoracic aortic ectasia: Secondary | ICD-10-CM

## 2023-06-23 MED ORDER — GADOPICLENOL 0.5 MMOL/ML IV SOLN
9.0000 mL | Freq: Once | INTRAVENOUS | Status: AC | PRN
  Administered 2023-06-23: 9 mL via INTRAVENOUS

## 2023-06-25 ENCOUNTER — Encounter: Payer: Self-pay | Admitting: Cardiology

## 2023-06-29 ENCOUNTER — Telehealth: Payer: Self-pay

## 2023-06-29 DIAGNOSIS — I77810 Thoracic aortic ectasia: Secondary | ICD-10-CM

## 2023-06-29 NOTE — Telephone Encounter (Signed)
-----   Message from Armanda Magic sent at 06/25/2023  3:10 PM EDT ----- Chest MRI showed mildly enlarged ascending aorta measuring 4.1 cm slightly increased from 4 cm 09/2020.  Repeat chest MRI MRA in 1 year

## 2023-06-29 NOTE — Telephone Encounter (Signed)
Called to discuss Chest MRI/MRA results, no answer, left message asking recipient to call our office.

## 2023-06-29 NOTE — Telephone Encounter (Signed)
Reviewed chest MRI which showed mildly enlarged ascending aorta measuring 4.1 cm slightly increased from 4 cm 09/2020. Patient verbalizes understanding to repeat chest MRI MRA in 1 year.

## 2023-07-22 ENCOUNTER — Other Ambulatory Visit: Payer: Self-pay | Admitting: Urology

## 2023-07-22 ENCOUNTER — Encounter: Payer: Self-pay | Admitting: Endocrinology

## 2023-07-22 ENCOUNTER — Ambulatory Visit: Payer: Medicare Other | Admitting: Endocrinology

## 2023-07-22 VITALS — BP 138/80 | HR 64 | Resp 20 | Ht 62.0 in | Wt 205.6 lb

## 2023-07-22 DIAGNOSIS — E0500 Thyrotoxicosis with diffuse goiter without thyrotoxic crisis or storm: Secondary | ICD-10-CM

## 2023-07-22 DIAGNOSIS — H5789 Other specified disorders of eye and adnexa: Secondary | ICD-10-CM

## 2023-07-22 DIAGNOSIS — E079 Disorder of thyroid, unspecified: Secondary | ICD-10-CM

## 2023-07-22 DIAGNOSIS — C641 Malignant neoplasm of right kidney, except renal pelvis: Secondary | ICD-10-CM

## 2023-07-22 LAB — TSH: TSH: 0.18 u[IU]/mL — ABNORMAL LOW (ref 0.35–5.50)

## 2023-07-22 LAB — T4, FREE: Free T4: 0.56 ng/dL — ABNORMAL LOW (ref 0.60–1.60)

## 2023-07-22 LAB — T3, FREE: T3, Free: 3 pg/mL (ref 2.3–4.2)

## 2023-07-23 ENCOUNTER — Telehealth: Payer: Self-pay

## 2023-07-23 MED ORDER — METHIMAZOLE 5 MG PO TABS: ORAL_TABLET | ORAL | 1 refills | Status: DC

## 2023-07-23 NOTE — Telephone Encounter (Signed)
Lab results and medication changes given to patient as directed by MD. Patient stated she will call back at later date to schedule lab appointment. No further questions from patient at this time.

## 2023-07-23 NOTE — Addendum Note (Signed)
Addended by: Nehemiah Montee, [place] on: 07/23/2023 08:06 AM   Modules accepted: Orders, Level of Service

## 2023-07-23 NOTE — Telephone Encounter (Signed)
-----   Message from [place] Thapa sent at 07/23/2023  8:07 AM EDT ----- Please notify patient of Labs reviewed thyroid hormone levels are improving, mildly low free T4.  She is currently taking methimazole total 12.5 mg daily.  I would like to decrease to 5 mg 2 times a day.  Check TSH, free T4, free T3 few days prior to follow-up visit in 3 months.  I have placed orders.  Please arrange for lab visit.

## 2023-08-02 DIAGNOSIS — H40013 Open angle with borderline findings, low risk, bilateral: Secondary | ICD-10-CM | POA: Diagnosis not present

## 2023-08-02 DIAGNOSIS — H5213 Myopia, bilateral: Secondary | ICD-10-CM | POA: Diagnosis not present

## 2023-08-02 DIAGNOSIS — H2513 Age-related nuclear cataract, bilateral: Secondary | ICD-10-CM | POA: Diagnosis not present

## 2023-10-18 ENCOUNTER — Other Ambulatory Visit: Payer: Self-pay

## 2023-10-18 ENCOUNTER — Other Ambulatory Visit: Payer: Medicare Other

## 2023-10-18 DIAGNOSIS — E0500 Thyrotoxicosis with diffuse goiter without thyrotoxic crisis or storm: Secondary | ICD-10-CM

## 2023-10-19 ENCOUNTER — Encounter: Payer: Self-pay | Admitting: Endocrinology

## 2023-10-19 LAB — T4, FREE: Free T4: 0.9 ng/dL (ref 0.8–1.8)

## 2023-10-19 LAB — TSH: TSH: 0.38 m[IU]/L — ABNORMAL LOW (ref 0.40–4.50)

## 2023-10-19 LAB — T3, FREE: T3, Free: 3.1 pg/mL (ref 2.3–4.2)

## 2023-10-22 ENCOUNTER — Encounter: Payer: Self-pay | Admitting: Endocrinology

## 2023-10-22 ENCOUNTER — Ambulatory Visit (INDEPENDENT_AMBULATORY_CARE_PROVIDER_SITE_OTHER): Payer: Medicare Other | Admitting: Endocrinology

## 2023-10-22 VITALS — BP 130/80 | HR 67 | Resp 20 | Ht 62.0 in | Wt 207.6 lb

## 2023-10-22 DIAGNOSIS — H5789 Other specified disorders of eye and adnexa: Secondary | ICD-10-CM | POA: Diagnosis not present

## 2023-10-22 DIAGNOSIS — E0500 Thyrotoxicosis with diffuse goiter without thyrotoxic crisis or storm: Secondary | ICD-10-CM

## 2023-10-22 DIAGNOSIS — E079 Disorder of thyroid, unspecified: Secondary | ICD-10-CM

## 2023-10-22 MED ORDER — METHIMAZOLE 5 MG PO TABS: ORAL_TABLET | ORAL | 3 refills | Status: DC

## 2023-10-22 NOTE — Progress Notes (Signed)
Outpatient Endocrinology Note Christina Deazia Lampi, MD  10/22/23  Patient's Name: Christina [name]    DOB: [birth year]    MRN: [id number]  REASON OF VISIT: Follow-up for hyperthyroidism Christina [name]  PCP: Lise Auer, MD  HISTORY OF PRESENT ILLNESS:   Christina [name] is a 73 y.o. old female with past medical history as listed below is presented for a follow up  of hyperthyroidism Christina [name].    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  He has positive thyrotropin receptor antibody, she had level up to 26 range.  Symptoms at the time of presentation was occasional palpitation, shortness of breath, hand tremors and paroxysmal atrial fibrillation.  Family history of hypothyroidism in mother. -Patient has been taking methimazole since her diagnosis since September 2020. -Methimazole dose has been adjusted periodically in the past.  # Patient has Graves' ophthalmopathy/Graves' eye [name], following with ophthalmology.  She was treated by oculoplastic ophthalmologist.  She was treated with steroid in the past.  She was treated with Tepezza infusion as well.  She had second round of Tepezza infusion in 2023 was stopped after her fourth infusion because of tinnitus.  Last infusion was in June 2023.CT scan of orbits has not shown any proptosis and only thickening of the eye muscles which has improved.  Interval history Patient has been taking methimazole 5 mg 2 times a day.  She felt occasional rare hot beat otherwise denies palpitation or heat intolerance.  No increased sweating.  No change in bowel habit.  She has gained about 4 pounds of weight in 3 to 4 months.  She mentioned she has been eating sweets at night.  No redness or watering of the eyes.  Recent lab results reviewed with mildly low TSH however has been improving with normal free T4 and free T3 as follows.   Latest Reference Range & Units 04/29/23 10:17 07/22/23 10:42  10/18/23 13:45  TSH 0.40 - 4.50 mIU/L 0.01 (L) 0.18 (L) 0.38 (L)  Triiodothyronine,Free,Serum 2.3 - 4.2 pg/mL 3.2 3.0 3.1  T4,Free(Direct) 0.8 - 1.8 ng/dL 0.86 5.78 (L) 0.9  (L): Data is abnormally low  REVIEW OF SYSTEMS:  As per history of present illness.   PAST MEDICAL HISTORY: Past Medical History:  Diagnosis Date   Acquired dilation of ascending aorta and aortic root (HCC)    4.1 cm ascending aorta by MRI/MRA 06/2023   Arthritis    PAIN AND OA RIGHT KNEE; S/P LEFT TOTAL KNEE REPLACEMENT 05-07-14 - STILL IN PHYSICAL THERAPY   Asthma    hx of - PT STATES MILD - NO LONGER REQUIRES INHALERS   Cancer (HCC)    renal cell carcinoma   CHF (congestive heart failure) (HCC)    Colitis    mild    GERD (gastroesophageal reflux [name])    H/O hiatal hernia    Hypertension    hx of 10 years ago ; PT'S BLOOD PRESSURE RECENTLY ELEVATED WHILE IN HOSP FOR SURG- NOT ON ANY B/P MEDS   Hyperthyroidism    PAF (paroxysmal atrial fibrillation) (HCC)    Pulmonary hypertension (HCC)    PASP 45 mmHg by echo 01/2022.  Normal PA pressures on echo 03/11/2023    PAST SURGICAL HISTORY: Past Surgical History:  Procedure Laterality Date   IR RADIOLOGIST EVAL & MGMT  08/28/2021   IR RADIOLOGIST EVAL & MGMT  03/10/2022   IR RADIOLOGIST EVAL & MGMT  04/02/2023   KNEE CLOSED REDUCTION Left 06/05/2014  Procedure: CLOSED MANIPULATION KNEE;  Surgeon: Shelda Pal, MD;  Location: [facility];  Service: Orthopedics;  Laterality: Left;   RIGHT/LEFT HEART CATH AND CORONARY ANGIOGRAPHY N/A 05/31/2019   Procedure: RIGHT/LEFT HEART CATH AND CORONARY ANGIOGRAPHY;  Surgeon: Swaziland, Peter M, MD;  Location: [facility] INVASIVE CV LAB;  Service: Cardiovascular;  Laterality: N/A;   ROBOTIC ASSITED PARTIAL NEPHRECTOMY Right 03/07/2021   Procedure: XI ROBOTIC ASSITED RADICAL NEPHRECTOMY;  Surgeon: Rene Paci, MD;  Location: [facility];  Service: Urology;  Laterality: Right;   TOTAL KNEE ARTHROPLASTY Left 05/07/2014   Procedure:  LEFT TOTAL KNEE ARTHROPLASTY;  Surgeon: Shelda Pal, MD;  Location: [facility];  Service: Orthopedics;  Laterality: Left;   TOTAL KNEE ARTHROPLASTY Right 06/05/2014   Procedure: RIGHT TOTAL KNEE ARTHROPLASTY;  Surgeon: Shelda Pal, MD;  Location: [facility];  Service: Orthopedics;  Laterality: Right;   WISDOM TEETH EXTRACTIONS      ALLERGIES: Allergies  Allergen Reactions   Hydrochlorothiazide Other (See Comments) and Hypertension    Wildly fluctuates B/P low-to-high, then high-to-low, in a matter of minutes   Hydrocodone-Acetaminophen Nausea And Vomiting    Patient DOES NOT wish to take EVER again...   Other Nausea And Vomiting and Other (See Comments)    Stronger pain meds (post-op) caused dehydration and constipation    FAMILY HISTORY:  Family History  Problem Relation Age of Onset   Hypothyroidism Mother    Graves' [name] Sister    Diabetes Brother    Diabetes Maternal Aunt     SOCIAL HISTORY: Social History   Socioeconomic History   Marital status: Single    Spouse name: Not on file   Number of children: Not on file   Years of education: Not on file   Highest education level: Not on file  Occupational History   Not on file  Tobacco Use   Smoking status: Never   Smokeless tobacco: Never  Vaping Use   Vaping status: Never Used  Substance and Sexual Activity   Alcohol use: No   Drug use: No   Sexual activity: Not Currently  Other Topics Concern   Not on file  Social History Narrative   Not on file   Social Drivers of Health   Financial Resource Strain: Not on file  Food Insecurity: Not on file  Transportation Needs: Not on file  Physical Activity: Not on file  Stress: Not on file  Social Connections: Not on file    MEDICATIONS:  Current Outpatient Medications  Medication Sig Dispense Refill   acetaminophen (TYLENOL) 650 MG CR tablet Take 1,300 mg by mouth in the morning. Take 2 tablets (1300 mg) by mouth scheduled every morning & may take an additional  dose (1300 mg) in the evening/afternoon if needed for pain.     adapalene (DIFFERIN) 0.1 % gel Apply 1 application topically at bedtime.     amlodipine-benazepril (LOTREL) 2.5-10 MG capsule Take 1 capsule by mouth daily. 90 capsule 3   amoxicillin (AMOXIL) 500 MG capsule as directed. Dental visits only     APPLE CIDER VINEGAR PO Take 1 tablet by mouth daily. 1200 mg     celecoxib (CELEBREX) 200 MG capsule Take 200 mg by mouth daily as needed.     Dapsone 5 % topical gel Apply 1 application topically in the morning. Apply to the face daily as directed     Flaxseed, Linseed, (FLAX SEED OIL PO) Take 1 tablet by mouth daily. 720 mg     fluticasone (  FLONASE) 50 MCG/ACT nasal spray Place into both nostrils as needed for allergies or rhinitis.     furosemide (LASIX) 20 MG tablet Take 1 tablet (20 mg total) by mouth daily. 90 tablet 3   gabapentin (NEURONTIN) 100 MG capsule Take 100-200 mg by mouth at bedtime as needed.     Lidocaine-Menthol (LIDOCAINE-MENTHOL ROLL-ON) 4-1 % LIQD Apply 1 application  topically 3 (three) times daily as needed (leg pain).     loratadine (CLARITIN) 10 MG tablet Take 10 mg by mouth in the morning.     LUTEIN PO Take 1 tablet by mouth daily. 25 mg     metoprolol tartrate (LOPRESSOR) 25 MG tablet TAKE 1 TABLET BY MOUTH ONCE DAILY AS NEEDED FOR  PALPITATIONS 90 tablet 3   selenium 50 MCG TABS tablet Take 200 mcg by mouth daily.     methimazole (TAPAZOLE) 5 MG tablet Take 1 tablets 2 times a day. 180 tablet 3   No current facility-administered medications for this visit.    PHYSICAL EXAM: Vitals:   10/22/23 1026  BP: 130/80  Pulse: 67  Resp: 20  SpO2: 99%  Weight: 207 lb 9.6 oz (94.2 kg)  Height: 5\' 2"  (1.575 m)   Body mass index is 37.97 kg/m.  Wt Readings from Last 3 Encounters:  10/22/23 207 lb 9.6 oz (94.2 kg)  07/22/23 205 lb 9.6 oz (93.3 kg)  04/29/23 203 lb (92.1 kg)    General: Well developed, well nourished female in no apparent distress.  HEENT:  AT/[place], no external lesions. Hearing intact to the spoken word Eyes: EOMI. No stare, proptosis or lid lag. Conjunctiva clear and no icterus. No erythema or watering Neck: Trachea midline, neck supple without appreciable thyromegaly or lymphadenopathy and no palpable thyroid nodules Lungs: Clear to auscultation, no wheeze. Respirations not labored Heart: S1S2, Regular in rate and rhythm.  Abdomen: Soft, non tender, non distended. Neurologic: Alert, oriented, normal speech, deep tendon biceps reflexes 2+,  no gross focal neurological deficit Extremities: No pedal pitting edema, no tremors of outstretched hands Skin: Warm, color good.  Psychiatric: Does not appear depressed or anxious  PERTINENT HISTORIC LABORATORY AND IMAGING STUDIES:  All pertinent laboratory results were reviewed. Please see HPI also for further details.   TSH  Date Value Ref Range Status  10/18/2023 0.38 (L) 0.40 - 4.50 mIU/L Final  07/22/2023 0.18 (L) 0.35 - 5.50 uIU/mL Final  04/29/2023 0.01 (L) 0.35 - 5.50 uIU/mL Final   T3, Total  Date Value Ref Range Status  08/22/2020 115 71 - 180 ng/dL Final    Lab Results  Component Value Date   FREET4 0.9 10/18/2023   FREET4 0.56 (L) 07/22/2023   FREET4 0.99 04/29/2023   T3FREE 3.1 10/18/2023   T3FREE 3.0 07/22/2023   T3FREE 3.2 04/29/2023   TSH 0.38 (L) 10/18/2023   TSH 0.18 (L) 07/22/2023   TSH 0.01 (L) 04/29/2023    Lab Results  Component Value Date   THYROTRECAB 23.40 (H) 04/29/2023   THYROTRECAB 15.00 (H) 09/01/2022   THYROTRECAB 26.60 (H) 04/20/2022    Lab Results  Component Value Date   TSH 0.38 (L) 10/18/2023   TSH 0.18 (L) 07/22/2023   TSH 0.01 (L) 04/29/2023   FREET4 0.9 10/18/2023   FREET4 0.56 (L) 07/22/2023   FREET4 0.99 04/29/2023     No results found for: "TSI"   No components found for: "TRAB"    ASSESSMENT / PLAN  1. Graves [name]   2. Thyroid eye [name]     -  Patient has hyperthyroidism secondary to Graves' [name].  She has  been on methimazole since September 2020.  Dose has been adjusted periodically. -She is currently taking methimazole 5 mg 2 times a day. -She is not a candidate for RAI I-131 treatment because of severe graves ophthalmopathy.  She does not want to go for thyroid surgery. -Will continue with antithyroid medication/methimazole at this time. -She has been following with ophthalmology for Graves' eye [name].  No active Graves' eye [name] today.  Plan: -Continue current dose of methimazole 5 mg 2 times a day. -Check thyroid function test TSH, free T4, free T3 prior to follow-up visit in 3 months.  Diagnoses and all orders for this visit:  Graves [name] -     T4, free -     T3, free -     TSH -     methimazole (TAPAZOLE) 5 MG tablet; Take 1 tablets 2 times a day.  Thyroid eye [name]  Other orders -     Discontinue: methimazole (TAPAZOLE) 5 MG tablet; Take 1 tablets 2 times a day.   DISPOSITION Follow up in clinic in 3 months suggested.  All questions answered and patient verbalized understanding of the plan.  Christina Beaumont Austad, MD [facility] Endocrinology [facility] [address] [place], [address] [place], [place] [address] Phone # [phone]   At least part of this note was generated using voice recognition software. Inadvertent word errors may have occurred, which were not recognized during the proofreading process.

## 2024-01-25 ENCOUNTER — Other Ambulatory Visit: Payer: Medicare Other

## 2024-01-25 DIAGNOSIS — E0500 Thyrotoxicosis with diffuse goiter without thyrotoxic crisis or storm: Secondary | ICD-10-CM | POA: Diagnosis not present

## 2024-01-26 ENCOUNTER — Encounter: Payer: Self-pay | Admitting: Endocrinology

## 2024-01-26 LAB — TSH: TSH: 0.28 m[IU]/L — ABNORMAL LOW (ref 0.40–4.50)

## 2024-01-26 LAB — T4, FREE: Free T4: 1.1 ng/dL (ref 0.8–1.8)

## 2024-01-26 LAB — T3, FREE: T3, Free: 3.4 pg/mL (ref 2.3–4.2)

## 2024-01-28 ENCOUNTER — Ambulatory Visit (INDEPENDENT_AMBULATORY_CARE_PROVIDER_SITE_OTHER): Payer: Medicare Other | Admitting: Endocrinology

## 2024-01-28 ENCOUNTER — Encounter: Payer: Self-pay | Admitting: Endocrinology

## 2024-01-28 VITALS — BP 138/80 | HR 68 | Resp 20 | Ht 62.0 in | Wt 210.2 lb

## 2024-01-28 DIAGNOSIS — H5789 Other specified disorders of eye and adnexa: Secondary | ICD-10-CM | POA: Diagnosis not present

## 2024-01-28 DIAGNOSIS — E079 Disorder of thyroid, unspecified: Secondary | ICD-10-CM

## 2024-01-28 DIAGNOSIS — E0500 Thyrotoxicosis with diffuse goiter without thyrotoxic crisis or storm: Secondary | ICD-10-CM

## 2024-01-28 DIAGNOSIS — H40013 Open angle with borderline findings, low risk, bilateral: Secondary | ICD-10-CM | POA: Diagnosis not present

## 2024-01-28 MED ORDER — METHIMAZOLE 5 MG PO TABS: ORAL_TABLET | ORAL | 3 refills | Status: DC

## 2024-01-28 NOTE — Progress Notes (Signed)
 Outpatient Endocrinology Note [place] Amad Mau, MD  01/28/24  Patient's Name: Christina [name]    DOB: 02-18-1951    MRN: [id number]  REASON OF VISIT: Follow-up for hyperthyroidism Murrell Arrant' disease  PCP: Beecher Bower, MD  HISTORY OF PRESENT ILLNESS:   Christina [name] is a 73 y.o. old female with past medical history as listed below is presented for a follow up  of hyperthyroidism Murrell Arrant' disease.    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  He has positive thyrotropin receptor antibody, she had level up to 26 range.  Symptoms at the time of presentation was occasional palpitation, shortness of breath, hand tremors and paroxysmal atrial fibrillation.  Family history of hypothyroidism in mother. -Patient has been taking methimazole  since her diagnosis since September 2020. -Methimazole  dose has been adjusted periodically in the past.  # Patient has Graves' ophthalmopathy/Graves' eye disease, following with ophthalmology.  She was treated by oculoplastic ophthalmologist.  She was treated with steroid in the past.  She was treated with Tepezza  infusion as well.  She had second round of Tepezza  infusion in 2023 was stopped after her fourth infusion because of tinnitus.  Last infusion was in June 2023.CT scan of orbits has not shown any proptosis and only thickening of the eye muscles which has improved.  Interval history Patient has been taking methimazole  5 mg 2 times a day.  Patient denies palpitation, heat intolerance.  Body weight is relatively stable.  No increased sweating.  No sleep problem.  No fever or any recent illness, no GI issues.  Denies any redness or watering of the eyes, regularly following with ophthalmology.  Patient thyroid  function test with mildly low TSH and normal free T4 and free T3 as follows.    Latest Reference Range & Units 01/25/24 11:04  TSH 0.40 - 4.50 mIU/L 0.28 (L)  Triiodothyronine,Free,Serum 2.3 - 4.2  pg/mL 3.4  T4,Free(Direct) 0.8 - 1.8 ng/dL 1.1  (L): Data is abnormally low  REVIEW OF SYSTEMS:  As per history of present illness.   PAST MEDICAL HISTORY: Past Medical History:  Diagnosis Date   Acquired dilation of ascending aorta and aortic root (HCC)    4.1 cm ascending aorta by MRI/MRA 06/2023   Arthritis    PAIN AND OA RIGHT KNEE; S/P LEFT TOTAL KNEE REPLACEMENT 05-07-14 - STILL IN PHYSICAL THERAPY   Asthma    hx of - PT STATES MILD - NO LONGER REQUIRES INHALERS   Cancer (HCC)    renal cell carcinoma   CHF (congestive heart failure) (HCC)    Colitis    mild    GERD (gastroesophageal reflux disease)    H/O hiatal hernia    Hypertension    hx of 10 years ago ; PT'S BLOOD PRESSURE RECENTLY ELEVATED WHILE IN HOSP FOR SURG- NOT ON ANY B/P MEDS   Hyperthyroidism    PAF (paroxysmal atrial fibrillation) (HCC)    Pulmonary hypertension (HCC)    PASP 45 mmHg by echo 01/2022.  Normal PA pressures on echo 03/11/2023    PAST SURGICAL HISTORY: Past Surgical History:  Procedure Laterality Date   IR RADIOLOGIST EVAL & MGMT  08/28/2021   IR RADIOLOGIST EVAL & MGMT  03/10/2022   IR RADIOLOGIST EVAL & MGMT  04/02/2023   KNEE CLOSED REDUCTION Left 06/05/2014   Procedure: CLOSED MANIPULATION KNEE;  Surgeon: Bevin Bucks, MD;  Location: [facility];  Service: Orthopedics;  Laterality: Left;   RIGHT/LEFT HEART CATH AND  CORONARY ANGIOGRAPHY N/A 05/31/2019   Procedure: RIGHT/LEFT HEART CATH AND CORONARY ANGIOGRAPHY;  Surgeon: Swazil[place], Peter M, MD;  Location: [facility] INVASIVE CV LAB;  Service: Cardiovascular;  Laterality: N/A;   ROBOTIC ASSITED PARTIAL NEPHRECTOMY Right 03/07/2021   Procedure: XI ROBOTIC ASSITED RADICAL NEPHRECTOMY;  Surgeon: Adelbert Homans, MD;  Location: [facility];  Service: Urology;  Laterality: Right;   TOTAL KNEE ARTHROPLASTY Left 05/07/2014   Procedure: LEFT TOTAL KNEE ARTHROPLASTY;  Surgeon: Bevin Bucks, MD;  Location: [facility];  Service: Orthopedics;  Laterality: Left;   TOTAL  KNEE ARTHROPLASTY Right 06/05/2014   Procedure: RIGHT TOTAL KNEE ARTHROPLASTY;  Surgeon: Bevin Bucks, MD;  Location: [facility];  Service: Orthopedics;  Laterality: Right;   WISDOM TEETH EXTRACTIONS      ALLERGIES: Allergies  Allergen Reactions   Hydrochlorothiazide Other (See Comments) and Hypertension    Wildly fluctuates B/P low-to-high, then high-to-low, in a matter of minutes   Hydrocodone -Acetaminophen  Nausea And Vomiting    Patient DOES NOT wish to take EVER again...   Other Nausea And Vomiting and Other (See Comments)    Stronger pain meds (post-op) caused dehydration and constipation    FAMILY HISTORY:  Family History  Problem Relation Age of Onset   Hypothyroidism Mother    Graves' disease Sister    Diabetes Brother    Diabetes Maternal Aunt     SOCIAL HISTORY: Social History   Socioeconomic History   Marital status: Single    Spouse name: Not on file   Number of children: Not on file   Years of education: Not on file   Highest education level: Not on file  Occupational History   Not on file  Tobacco Use   Smoking status: Never   Smokeless tobacco: Never  Vaping Use   Vaping status: Never Used  Substance and Sexual Activity   Alcohol use: No   Drug use: No   Sexual activity: Not Currently  Other Topics Concern   Not on file  Social History Narrative   Not on file   Social Drivers of Health   Financial Resource Strain: Not on file  Food Insecurity: Not on file  Transportation Needs: Not on file  Physical Activity: Not on file  Stress: Not on file  Social Connections: Not on file    MEDICATIONS:  Current Outpatient Medications  Medication Sig Dispense Refill   acetaminophen  (TYLENOL ) 650 MG CR tablet Take 1,300 mg by mouth in the morning. Take 2 tablets (1300 mg) by mouth scheduled every morning & may take an additional dose (1300 mg) in the evening/afternoon if needed for pain.     adapalene (DIFFERIN) 0.1 % gel Apply 1 application topically at  bedtime.     amlodipine -benazepril  (LOTREL) 2.5-10 MG capsule Take 1 capsule by mouth daily. 90 capsule 3   amoxicillin (AMOXIL) 500 MG capsule as directed. Dental visits only     APPLE CIDER VINEGAR PO Take 1 tablet by mouth daily. 1200 mg     celecoxib  (CELEBREX ) 200 MG capsule Take 200 mg by mouth daily as needed.     Dapsone  5 % topical gel Apply 1 application topically in the morning. Apply to the face daily as directed     Flaxseed, Linseed, (FLAX SEED OIL PO) Take 1 tablet by mouth daily. 720 mg     fluticasone (FLONASE) 50 MCG/ACT nasal spray Place into both nostrils as needed for allergies or rhinitis.     furosemide  (LASIX ) 20 MG tablet Take 1  tablet (20 mg total) by mouth daily. 90 tablet 3   gabapentin (NEURONTIN) 100 MG capsule Take 100-200 mg by mouth at bedtime as needed.     Lidocaine -Menthol  (LIDOCAINE -MENTHOL  ROLL-ON) 4-1 % LIQD Apply 1 application  topically 3 (three) times daily as needed (leg pain).     loratadine (CLARITIN) 10 MG tablet Take 10 mg by mouth in the morning.     LUTEIN PO Take 1 tablet by mouth daily. 25 mg     metoprolol  tartrate (LOPRESSOR ) 25 MG tablet TAKE 1 TABLET BY MOUTH ONCE DAILY AS NEEDED FOR  PALPITATIONS 90 tablet 3   selenium 50 MCG TABS tablet Take 200 mcg by mouth daily.     methimazole  (TAPAZOLE ) 5 MG tablet Take 1 tablets 2 times a day. 180 tablet 3   No current facility-administered medications for this visit.    PHYSICAL EXAM: Vitals:   01/28/24 1044  BP: 138/80  Pulse: 68  Resp: 20  SpO2: 98%  Weight: 210 lb 3.2 oz (95.3 kg)  Height: 5\' 2"  (1.575 m)   Body mass index is 38.45 kg/m.  Wt Readings from Last 3 Encounters:  01/28/24 210 lb 3.2 oz (95.3 kg)  10/22/23 207 lb 9.6 oz (94.2 kg)  07/22/23 205 lb 9.6 oz (93.3 kg)    General: Well developed, well nourished female in no apparent distress.  HEENT: AT/[place], no external lesions. Hearing intact to the spoken word Eyes: EOMI. No stare, proptosis or lid lag. Conjunctiva clear  and no icterus. No erythema or watering Neck: Trachea midline, neck supple without appreciable thyromegaly or lymphadenopathy and no palpable thyroid  nodules Lungs: Clear to auscultation, no wheeze. Respirations not labored Heart: S1S2, Regular in rate and rhythm.  Abdomen: Soft, non tender, non distended. Neurologic: Alert, oriented, normal speech, deep tendon biceps reflexes 2+,  no gross focal neurological deficit Extremities: No pedal pitting edema, no tremors of outstretched hands Skin: Warm, color good.  Psychiatric: Does not appear depressed or anxious  PERTINENT HISTORIC LABORATORY AND IMAGING STUDIES:  All pertinent laboratory results were reviewed. Please see HPI also for further details.   TSH  Date Value Ref Range Status  01/25/2024 0.28 (L) 0.40 - 4.50 mIU/L Final  10/18/2023 0.38 (L) 0.40 - 4.50 mIU/L Final  07/22/2023 0.18 (L) 0.35 - 5.50 uIU/mL Final   T3, Total  Date Value Ref Range Status  08/22/2020 115 71 - 180 ng/dL Final    Lab Results  Component Value Date   FREET4 1.1 01/25/2024   FREET4 0.9 10/18/2023   FREET4 0.56 (L) 07/22/2023   T3FREE 3.4 01/25/2024   T3FREE 3.1 10/18/2023   T3FREE 3.0 07/22/2023   TSH 0.28 (L) 01/25/2024   TSH 0.38 (L) 10/18/2023   TSH 0.18 (L) 07/22/2023    Lab Results  Component Value Date   THYROTRECAB 23.40 (H) 04/29/2023   THYROTRECAB 15.00 (H) 09/01/2022   THYROTRECAB 26.60 (H) 04/20/2022    Lab Results  Component Value Date   TSH 0.28 (L) 01/25/2024   TSH 0.38 (L) 10/18/2023   TSH 0.18 (L) 07/22/2023   FREET4 1.1 01/25/2024   FREET4 0.9 10/18/2023   FREET4 0.56 (L) 07/22/2023     No results found for: "TSI"   No components found for: "TRAB"    ASSESSMENT / PLAN  1. Graves disease   2. Thyroid  eye disease    -Patient has hyperthyroidism secondary to Graves' disease.  She has been on methimazole  since September 2020.  Dose has been adjusted periodically. -  She is currently taking methimazole  5 mg 2 times  a day. -She is not a candidate for RAI I-131 treatment because of severe graves ophthalmopathy.  She does not want to go for thyroid  surgery. -Will continue with antithyroid medication/methimazole  at this time. -She has been following with ophthalmology for Graves' eye disease.  No active Graves' eye disease today. - Recent thyroid  function test with mildly low TSH.  Overall TSH has been better compared to 1 year ago.  Plan: - No plan to increase the dose of methimazole  at this time.  Continue current dose of methimazole  5 mg 2 times a day. -Check thyroid  function test TSH, free T4, free T3 prior to follow-up visit in 3 months.  Will check thyrotropin receptor antibody in the next set of lab. - If she continued to have low TSH in the future visit will increase dose of methimazole .  Diagnoses and all orders for this visit:  Graves disease -     T4, free -     T3, free -     TSH -     TRAb (TSH Receptor Binding Antibody) -     methimazole  (TAPAZOLE ) 5 MG tablet; Take 1 tablets 2 times a day.  Thyroid  eye disease    DISPOSITION Follow up in clinic in 3 months suggested.  Labs prior to follow-up visit.  All questions answered and patient verbalized understanding of the plan.  Iraq Azarias Chiou, MD [facility] Endocrinology [facility] [address], [address] [place], [place] [address] Phone # [phone]   At least part of this note was generated using voice recognition software. Inadvertent word errors may have occurred, which were not recognized during the proofreading process.

## 2024-02-07 ENCOUNTER — Encounter: Payer: Self-pay | Admitting: Cardiology

## 2024-02-07 ENCOUNTER — Ambulatory Visit: Attending: Cardiology | Admitting: Cardiology

## 2024-02-07 VITALS — BP 142/79 | HR 58 | Ht 62.0 in | Wt 208.8 lb

## 2024-02-07 DIAGNOSIS — Z79899 Other long term (current) drug therapy: Secondary | ICD-10-CM

## 2024-02-07 DIAGNOSIS — I77810 Thoracic aortic ectasia: Secondary | ICD-10-CM | POA: Diagnosis not present

## 2024-02-07 DIAGNOSIS — I5032 Chronic diastolic (congestive) heart failure: Secondary | ICD-10-CM

## 2024-02-07 DIAGNOSIS — I351 Nonrheumatic aortic (valve) insufficiency: Secondary | ICD-10-CM | POA: Diagnosis not present

## 2024-02-07 DIAGNOSIS — R011 Cardiac murmur, unspecified: Secondary | ICD-10-CM | POA: Diagnosis not present

## 2024-02-07 DIAGNOSIS — I272 Pulmonary hypertension, unspecified: Secondary | ICD-10-CM | POA: Diagnosis not present

## 2024-02-07 DIAGNOSIS — I340 Nonrheumatic mitral (valve) insufficiency: Secondary | ICD-10-CM | POA: Diagnosis not present

## 2024-02-07 DIAGNOSIS — I480 Paroxysmal atrial fibrillation: Secondary | ICD-10-CM

## 2024-02-07 DIAGNOSIS — I10 Essential (primary) hypertension: Secondary | ICD-10-CM | POA: Diagnosis not present

## 2024-02-07 MED ORDER — METOPROLOL TARTRATE 25 MG PO TABS: ORAL_TABLET | ORAL | 3 refills | Status: AC

## 2024-02-07 MED ORDER — FUROSEMIDE 20 MG PO TABS: 20.0000 mg | ORAL_TABLET | Freq: Every day | ORAL | 3 refills | Status: DC

## 2024-02-07 MED ORDER — AMLODIPINE BESY-BENAZEPRIL HCL 2.5-10 MG PO CAPS: 1.0000 | ORAL_CAPSULE | Freq: Every day | ORAL | 3 refills | Status: AC

## 2024-02-07 MED ORDER — FUROSEMIDE 20 MG PO TABS: 20.0000 mg | ORAL_TABLET | Freq: Every day | ORAL | 3 refills | Status: AC | PRN

## 2024-02-07 NOTE — Addendum Note (Signed)
 Addended by: Cherylyn Cos on: 02/07/2024 11:25 AM   Modules accepted: Orders

## 2024-02-07 NOTE — Patient Instructions (Signed)
 Medication Instructions:  Please decrease your lasix  to 20 mg daily as needed for leg edema.   *If you need a refill on your cardiac medications before your next appointment, please call your pharmacy*  Lab Work: Please complete a BMET in our lab on the first floor before you leave today. You do not need to be fasting.  If you have labs (blood work) drawn today and your tests are completely normal, you will receive your results only by: MyChart Message (if you have MyChart) OR A paper copy in the mail If you have any lab test that is abnormal or we need to change your treatment, we will call you to review the results.  Testing/Procedures: Your physician has requested that you have an echocardiogram. Echocardiography is a painless test that uses sound waves to create images of your heart. It provides your doctor with information about the size and shape of your heart and how well your heart's chambers and valves are working. This procedure takes approximately one hour. There are no restrictions for this procedure. Please do NOT wear cologne, perfume, aftershave, or lotions (deodorant is allowed). Please arrive 15 minutes prior to your appointment time.  Please note: We ask at that you not bring children with you during ultrasound (echo/ vascular) testing. Due to room size and safety concerns, children are not allowed in the ultrasound rooms during exams. Our front office staff cannot provide observation of children in our lobby area while testing is being conducted. An adult accompanying a patient to their appointment will only be allowed in the ultrasound room at the discretion of the ultrasound technician under special circumstances. We apologize for any inconvenience.   Follow-Up: At [facility], you and your health needs are our priority.  As part of our continuing mission to provide you with exceptional heart care, our providers are all part of one team.  This team includes your  primary Cardiologist (physician) and Advanced Practice Providers or APPs (Physician Assistants and Nurse Practitioners) who all work together to provide you with the care you need, when you need it.  Your next appointment:   1 year(s)  Provider:   Gaylyn Keas, MD

## 2024-02-07 NOTE — Progress Notes (Addendum)
 Cardiology Office Note:    Date:  02/07/2024   ID:  Cleveland, Paiz 07-20-1951, MRN [id number]  PCP:  Beecher Bower, MD  Cardiologist:  Gaylyn Keas, MD    Referring MD: Beecher Bower, MD   Chief Complaint  Patient presents with   Congestive Heart Failure   Hypertension   Aortic Insuffiency   Mitral Regurgitation    History of Present Illness:    Christina [name] is a 73 y.o. female with a hx of chronic systolic CHF, normal coronary arteries, PAF, hyperthyroidism, asthma, GERD and HTN.  She is treated for her hyperthyroidism by Endocrine and is on Tapazole .     She is here today for followup and is doing well. She has chronic DOE and LE edema that is stable.  She denies any chest pain or pressure,  PND, orthopnea, dizziness, palpitations or syncope. She is compliant with her meds and is tolerating meds with no SE.    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  Normal PA pressures on echo 03/11/2023    Past Surgical History:  Procedure Laterality Date   IR RADIOLOGIST EVAL & MGMT  08/28/2021   IR RADIOLOGIST EVAL & MGMT  03/10/2022   IR RADIOLOGIST EVAL & MGMT  04/02/2023   KNEE CLOSED REDUCTION Left 06/05/2014   Procedure: CLOSED MANIPULATION KNEE;  Surgeon: Bevin Bucks, MD;  Location: [facility];   Service: Orthopedics;  Laterality: Left;   RIGHT/LEFT HEART CATH AND CORONARY ANGIOGRAPHY N/A 05/31/2019   Procedure: RIGHT/LEFT HEART CATH AND CORONARY ANGIOGRAPHY;  Surgeon: Swaz[place], Peter M, MD;  Location: [facility] INVASIVE CV LAB;  Service: Cardiovascular;  Laterality: N/A;   ROBOTIC ASSITED PARTIAL NEPHRECTOMY Right 03/07/2021   Procedure: XI ROBOTIC ASSITED RADICAL NEPHRECTOMY;  Surgeon: Adelbert Homans, MD;  Location: [facility];  Service: Urology;  Laterality: Right;   TOTAL KNEE ARTHROPLASTY Left 05/07/2014   Procedure: LEFT TOTAL KNEE ARTHROPLASTY;  Surgeon: Bevin Bucks, MD;  Location: [facility];  Service: Orthopedics;  Laterality: Left;   TOTAL KNEE ARTHROPLASTY Right 06/05/2014   Procedure: RIGHT TOTAL KNEE ARTHROPLASTY;  Surgeon: Bevin Bucks, MD;  Location: [facility];  Service: Orthopedics;  Laterality: Right;   WISDOM TEETH EXTRACTIONS      Current Medications: Current Meds  Medication Sig   acetaminophen  (TYLENOL ) 650 MG CR tablet Take 1,300 mg by mouth in the morning. Take 2 tablets (1300 mg) by mouth scheduled every morning & may take an additional dose (1300 mg) in the evening/afternoon if needed for pain.   adapalene (DIFFERIN) 0.1 % gel Apply 1 application topically at bedtime.  amoxicillin (AMOXIL) 500 MG capsule as directed. Dental visits only   APPLE CIDER VINEGAR PO Take 1 tablet by mouth daily. 1200 mg   celecoxib  (CELEBREX ) 200 MG capsule Take 200 mg by mouth daily as needed.   Dapsone  5 % topical gel Apply 1 application topically in the morning. Apply to the face daily as directed   Flaxseed, Linseed, (FLAX SEED OIL PO) Take 1 tablet by mouth daily. 720 mg   fluticasone (FLONASE) 50 MCG/ACT nasal spray Place into both nostrils as needed for allergies or rhinitis.   gabapentin (NEURONTIN) 100 MG capsule Take 100-200 mg by mouth at bedtime as needed.   Lidocaine -Menthol  (LIDOCAINE -MENTHOL  ROLL-ON) 4-1 % LIQD Apply 1 application  topically 3 (three) times daily as needed  (leg pain).   loratadine (CLARITIN) 10 MG tablet Take 10 mg by mouth in the morning.   LUTEIN PO Take 1 tablet by mouth daily. 25 mg   methimazole  (TAPAZOLE ) 5 MG tablet Take 1 tablets 2 times a day.   selenium 50 MCG TABS tablet Take 200 mcg by mouth daily.   [DISCONTINUED] amlodipine -benazepril  (LOTREL) 2.5-10 MG capsule Take 1 capsule by mouth daily.   [DISCONTINUED] furosemide  (LASIX ) 20 MG tablet Take 1 tablet (20 mg total) by mouth daily.     Allergies:   Hydrochlorothiazide, Hydrocodone -acetaminophen , and Other   Social History   Socioeconomic History   Marital status: Single    Spouse name: Not on file   Number of children: Not on file   Years of education: Not on file   Highest education level: Not on file  Occupational History   Not on file  Tobacco Use   Smoking status: Never   Smokeless tobacco: Never  Vaping Use   Vaping status: Never Used  Substance and Sexual Activity   Alcohol use: No   Drug use: No   Sexual activity: Not Currently  Other Topics Concern   Not on file  Social History Narrative   Not on file   Social Drivers of Health   Financial Resource Strain: Not on file  Food Insecurity: Not on file  Transportation Needs: Not on file  Physical Activity: Not on file  Stress: Not on file  Social Connections: Not on file     Family History: The patient's family history includes Diabetes in her brother and maternal aunt; Murrell Arrant' disease in her sister; Hypothyroidism in her mother.  ROS:   Please see the history of present illness.    ROS  All other systems reviewed and negative.   EKGs/Labs/Other Studies Reviewed:    The following studies were reviewed today:  EKG Interpretation Date/Time:  Monday Feb 07 2024 10:53:48 EDT Ventricular Rate:  58 PR Interval:  138 QRS Duration:  80 QT Interval:  450 QTC Calculation: 441 R Axis:   -9  Text Interpretation: Sinus bradycardia Possible Anterior infarct , age undetermined When compared with ECG  of 08-Mar-2021 17:00, No significant change was found Confirmed by Gaylyn Keas [phone]) on 02/07/2024 11:09:47 AM   Recent Labs: 03/01/2023: Hemoglobin 13.9; Platelets 309.0 04/29/2023: ALT 11; BUN 18; Creatinine, Ser 1.11; Potassium 5.3; Sodium 138 01/25/2024: TSH 0.28   Recent Lipid Panel    Component Value Date/Time   CHOL 142 05/30/2019 1852   TRIG 42 05/30/2019 1852   HDL 55 05/30/2019 1852   CHOLHDL 2.6 05/30/2019 1852   VLDL 8 05/30/2019 1852   LDLCALC 79 05/30/2019 1852    Physical Exam:    VS:  BP (!) 142/79  Pulse (!) 58   Ht 5\' 2"  (1.575 m)   Wt 208 lb 12.8 oz (94.7 kg)   SpO2 97%   BMI 38.19 kg/m     Wt Readings from Last 3 Encounters:  02/07/24 208 lb 12.8 oz (94.7 kg)  01/28/24 210 lb 3.2 oz (95.3 kg)  10/22/23 207 lb 9.6 oz (94.2 kg)    GEN: Well nourished, well developed in no acute distress HEENT: Normal NECK: No JVD; No carotid bruits LYMPHATICS: No lymphadenopathy CARDIAC:RRR, no  rubs, gallops 2/6 SM at RUSB RESPIRATORY:  Clear to auscultation without rales, wheezing or rhonchi  ABDOMEN: Soft, non-tender, non-distended MUSCULOSKELETAL:  No edema; No deformity  SKIN: Warm and dry NEUROLOGIC:  Alert and oriented x 3 PSYCHIATRIC:  Normal affect  ASSESSMENT:    1. Chronic diastolic congestive heart failure (HCC)   2. PAF (paroxysmal atrial fibrillation) (HCC)   3. Essential hypertension   4. Acquired dilation of ascending aorta and aortic root (HCC)   5. Nonrheumatic aortic valve insufficiency   6. Nonrheumatic mitral valve regurgitation   7. Pulmonary hypertension, unspecified (HCC)    PLAN:    In order of problems listed above:  Chronic diastolic CHF - cath 05/31/2019 with normal coronaries however reduced LV function on echocardiogram.   -2D echo done 08/2020 showed normal LVF with EF 55% with mild LVE -Her chronic SOB is stable.   -She has chronic LE edema exacerbated by sitting all day  -She does not like to wear compression hose.   -Appears euvolemic on exam today -check BMET - She is complaining that she has to pee a lot throughout the day on the Lasix  and wants to try to decrease the dose  -She has no edema on exam today I told her she can start taking Lasix  20 mg daily PRN for edema >>refilled today  PAF -occurred in setting of Grave's dz but CHADS2VASC score is 4 -She remains in normal rhythm today and denies any palpitations -she is no longer on DOAC as her afib occurred in setting of hyperthyroidism -she stopped the BB on her own because it was causing her to have bradycardia>>she keeps a Rx active to take PRN for palpitations  HTN - BP controlled on exam today - Continue amlodipine  benazepril  2.5/10 mg daily >>refilled today  Mildly dilated ascending aorta -noted to be 4.2cm on echo 08/2020 -Chest CTA reviewed and showed 4cm dilatation -2D echo 02/07/2022 with ascending aorta at 4.1 cm -2D echo 03/15/2023 with ascending aorta 4.3 cm -She will have a chest MRI/MRA in Oct 2025  Aortic insufficiency -mild by echo 2023 and 2024 -she has a loud SM at the RUSB today so I will repeat echo  Mitral regurgitation -mild by echo 2023 and 2024  Pulmonary HTN -mild with PASP by echo 2023 -ANA and RA were negative in 2023 -sleep study with no OSA -PASP 34 mmHg on echo 03/15/2023   Followup with me in 1 year   Medication Adjustments/Labs and Tests Ordered: Current medicines are reviewed at length with the patient today.  Concerns regarding medicines are outlined above.  Orders Placed This Encounter  Procedures   EKG 12-Lead   Meds ordered this encounter  Medications   amlodipine -benazepril  (LOTREL) 2.5-10 MG capsule    Sig: Take 1 capsule by mouth daily.    Dispense:  90 capsule    Refill:  3   furosemide  (LASIX ) 20 MG tablet    Sig: Take 1 tablet (20 mg total) by mouth  daily.    Dispense:  90 tablet    Refill:  3   metoprolol  tartrate (LOPRESSOR ) 25 MG tablet    Sig: TAKE 1 TABLET BY MOUTH  ONCE DAILY AS NEEDED FOR  PALPITATIONS    Dispense:  90 tablet    Refill:  3    Signed, Gaylyn Keas, MD  02/07/2024 11:04 AM     [facility]

## 2024-02-08 ENCOUNTER — Ambulatory Visit: Payer: Self-pay | Admitting: Cardiology

## 2024-02-08 LAB — BASIC METABOLIC PANEL WITH GFR
BUN/Creatinine Ratio: 16 (ref 12–28)
BUN: 18 mg/dL (ref 8–27)
CO2: 19 mmol/L — ABNORMAL LOW (ref 20–29)
Calcium: 9.3 mg/dL (ref 8.7–10.3)
Chloride: 100 mmol/L (ref 96–106)
Creatinine, Ser: 1.15 mg/dL — ABNORMAL HIGH (ref 0.57–1.00)
Glucose: 89 mg/dL (ref 70–99)
Potassium: 5 mmol/L (ref 3.5–5.2)
Sodium: 139 mmol/L (ref 134–144)
eGFR: 51 mL/min/{1.73_m2} — ABNORMAL LOW (ref >59)

## 2024-02-25 ENCOUNTER — Ambulatory Visit
Admission: RE | Admit: 2024-02-25 | Discharge: 2024-02-25 | Disposition: A | Discharge status: Home / Self Care | Payer: Medicare Other | Source: Ambulatory Visit | Attending: Urology | Admitting: Urology

## 2024-02-25 DIAGNOSIS — Z905 Acquired absence of kidney: Secondary | ICD-10-CM | POA: Diagnosis not present

## 2024-02-25 DIAGNOSIS — Z85528 Personal history of other malignant neoplasm of kidney: Secondary | ICD-10-CM | POA: Diagnosis not present

## 2024-02-25 DIAGNOSIS — C641 Malignant neoplasm of right kidney, except renal pelvis: Secondary | ICD-10-CM

## 2024-02-25 DIAGNOSIS — N289 Disorder of kidney and ureter, unspecified: Secondary | ICD-10-CM | POA: Diagnosis not present

## 2024-02-25 MED ORDER — GADOPICLENOL 0.5 MMOL/ML IV SOLN
9.0000 mL | Freq: Once | INTRAVENOUS | Status: AC | PRN
Start: 2024-02-25 — End: 2024-02-25
  Administered 2024-02-25: 9 mL via INTRAVENOUS

## 2024-03-15 ENCOUNTER — Ambulatory Visit (HOSPITAL_COMMUNITY)
Admission: RE | Admit: 2024-03-15 | Discharge: 2024-03-15 | Disposition: A | Discharge status: Home / Self Care | Source: Ambulatory Visit | Attending: Cardiology | Admitting: Cardiology

## 2024-03-15 DIAGNOSIS — R011 Cardiac murmur, unspecified: Secondary | ICD-10-CM

## 2024-03-15 LAB — ECHOCARDIOGRAM COMPLETE
Area-P 1/2: 2.99 cm2
P 1/2 time: 682 ms
S' Lateral: 3 cm

## 2024-03-16 DIAGNOSIS — M5432 Sciatica, left side: Secondary | ICD-10-CM | POA: Diagnosis not present

## 2024-03-16 DIAGNOSIS — I4891 Unspecified atrial fibrillation: Secondary | ICD-10-CM | POA: Diagnosis not present

## 2024-03-16 DIAGNOSIS — M159 Polyosteoarthritis, unspecified: Secondary | ICD-10-CM | POA: Diagnosis not present

## 2024-03-22 ENCOUNTER — Other Ambulatory Visit: Payer: Self-pay

## 2024-03-22 DIAGNOSIS — I77810 Thoracic aortic ectasia: Secondary | ICD-10-CM

## 2024-03-22 DIAGNOSIS — I5032 Chronic diastolic (congestive) heart failure: Secondary | ICD-10-CM

## 2024-03-23 ENCOUNTER — Other Ambulatory Visit: Payer: Self-pay | Admitting: Family Medicine

## 2024-03-23 DIAGNOSIS — M5432 Sciatica, left side: Secondary | ICD-10-CM

## 2024-04-05 ENCOUNTER — Other Ambulatory Visit: Payer: Self-pay | Admitting: Interventional Radiology

## 2024-04-05 DIAGNOSIS — C642 Malignant neoplasm of left kidney, except renal pelvis: Secondary | ICD-10-CM

## 2024-04-10 ENCOUNTER — Ambulatory Visit
Admission: RE | Admit: 2024-04-10 | Discharge: 2024-04-10 | Disposition: A | Discharge status: Home / Self Care | Source: Ambulatory Visit | Attending: Interventional Radiology | Admitting: Interventional Radiology

## 2024-04-10 ENCOUNTER — Ambulatory Visit
Admission: RE | Admit: 2024-04-10 | Discharge: 2024-04-10 | Disposition: A | Discharge status: Home / Self Care | Source: Ambulatory Visit | Attending: Family Medicine | Admitting: Family Medicine

## 2024-04-10 DIAGNOSIS — C642 Malignant neoplasm of left kidney, except renal pelvis: Secondary | ICD-10-CM

## 2024-04-10 DIAGNOSIS — M47816 Spondylosis without myelopathy or radiculopathy, lumbar region: Secondary | ICD-10-CM | POA: Diagnosis not present

## 2024-04-10 DIAGNOSIS — M5432 Sciatica, left side: Secondary | ICD-10-CM

## 2024-04-10 DIAGNOSIS — M4316 Spondylolisthesis, lumbar region: Secondary | ICD-10-CM | POA: Diagnosis not present

## 2024-04-10 DIAGNOSIS — Z85528 Personal history of other malignant neoplasm of kidney: Secondary | ICD-10-CM | POA: Diagnosis not present

## 2024-04-10 DIAGNOSIS — N2889 Other specified disorders of kidney and ureter: Secondary | ICD-10-CM | POA: Diagnosis not present

## 2024-04-10 HISTORY — PX: IR RADIOLOGIST EVAL & MGMT: IMG5224

## 2024-04-10 NOTE — Progress Notes (Signed)
 Chief Complaint: Patient was consulted remotely today (TeleHealth) for  follow-up of left renal lesions.   History of Present Illness: GENIFER LAZENBY is a 73 y.o. 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 At the time of detection of the right renal carcinoma, 2 small left renal lesions were also detected initially by CT emanating from the mid to lower pole with a partially exophytic lateral cortical lesion demonstrating partially cystic characteristics by unenhanced imaging and demonstrating probable partial internal enhancement with maximal diameter of approximately 1.2 cm by CT and an additional posterior cortical lesion which is endophytic and partially cystic demonstrating probable internal enhancement and measuring approximately 1.3 cm in greatest diameter by CT.  Both of these lesions were subsequently followed by MRI of the abdomen on 07/25/2021 and demonstrated no growth with more accurate diameter estimate of approximately 10 mm each and demonstrating partial internal enhancement. As the small left renal cortical lesions demonstrated no growth in a 92-month interval between CT and MRI studies, decision was made at the time of prior consultation to perform interval surveillance imaging. The lesions demonstrated stability on MRI studies on 03/04/22 and 03/13/23. Follow up MRI was performed on 02/25/24.   Ms. Hottenstein is having some back pain with radiculopathy in her left leg and is scheduled for MRI of the lumbar spine later today.  Past Medical History:  Diagnosis Date   Acquired dilation of ascending aorta and aortic root (HCC)    4.1 cm ascending aorta by MRI/MRA 06/2023   Arthritis    PAIN AND OA RIGHT KNEE; S/P LEFT TOTAL KNEE REPLACEMENT 05-07-14 - STILL IN PHYSICAL THERAPY   Asthma    hx of - PT STATES MILD - NO LONGER REQUIRES INHALERS   Cancer (HCC)    renal cell carcinoma   CHF (congestive heart failure) (HCC)    Colitis     mild    GERD (gastroesophageal reflux disease)    H/O hiatal hernia    Hypertension    hx of 10 years ago ; PT'S BLOOD PRESSURE RECENTLY ELEVATED WHILE IN HOSP FOR SURG- NOT ON ANY B/P MEDS   Hyperthyroidism    PAF (paroxysmal atrial fibrillation) (HCC)    Pulmonary hypertension (HCC)    PASP 45 mmHg by echo 01/2022.  Normal PA pressures on echo 03/11/2023    Past Surgical History:  Procedure Laterality Date   IR RADIOLOGIST EVAL & MGMT  08/28/2021   IR RADIOLOGIST EVAL & MGMT  03/10/2022   IR RADIOLOGIST EVAL & MGMT  04/02/2023   KNEE CLOSED REDUCTION Left 06/05/2014   Procedure: CLOSED MANIPULATION KNEE;  Surgeon: Donnice JONETTA Car, MD;  Location: [facility];  Service: Orthopedics;  Laterality: Left;   RIGHT/LEFT HEART CATH AND CORONARY ANGIOGRAPHY N/A 05/31/2019   Procedure: RIGHT/LEFT HEART CATH AND CORONARY ANGIOGRAPHY;  Surgeon: [place], Peter M, MD;  Location: [facility] INVASIVE CV LAB;  Service: Cardiovascular;  Laterality: N/A;   ROBOTIC ASSITED PARTIAL NEPHRECTOMY Right 03/07/2021   Procedure: XI ROBOTIC ASSITED RADICAL NEPHRECTOMY;  Surgeon: Devere Lonni Righter, MD;  Location: [facility];  Service: Urology;  Laterality: Right;   TOTAL KNEE ARTHROPLASTY Left 05/07/2014   Procedure: LEFT TOTAL KNEE ARTHROPLASTY;  Surgeon: Donnice JONETTA Car, MD;  Location: [facility];  Service: Orthopedics;  Laterality: Left;   TOTAL KNEE ARTHROPLASTY Right 06/05/2014   Procedure: RIGHT TOTAL KNEE ARTHROPLASTY;  Surgeon: Donnice JONETTA Car, MD;  Location: [facility];  Service: Orthopedics;  Laterality: Right;   WISDOM TEETH EXTRACTIONS      Allergies: Hydrochlorothiazide, Hydrocodone -acetaminophen , and Other  Medications: Prior to Admission medications   Medication Sig Start Date End Date Taking? Authorizing Provider  acetaminophen  (TYLENOL ) 650 MG CR tablet Take 1,300 mg by mouth in the morning. Take 2 tablets (1300 mg) by mouth scheduled every morning & may take an additional dose (1300 mg) in the evening/afternoon if needed  for pain.    [provider]  adapalene (DIFFERIN) 0.1 % gel Apply 1 application topically at bedtime.    [provider]  amlodipine -benazepril  (LOTREL) 2.5-10 MG capsule Take 1 capsule by mouth daily. 02/07/24   Shlomo Wilbert SAUNDERS, MD  amoxicillin (AMOXIL) 500 MG capsule as directed. Dental visits only 04/03/21   [provider]  APPLE CIDER VINEGAR PO Take 1 tablet by mouth daily. 1200 mg    [provider]  celecoxib  (CELEBREX ) 200 MG capsule Take 200 mg by mouth daily as needed. 04/12/23   [provider]  Dapsone  5 % topical gel Apply 1 application topically in the morning. Apply to the face daily as directed    [provider]  Flaxseed, Linseed, (FLAX SEED OIL PO) Take 1 tablet by mouth daily. 720 mg    [provider]  fluticasone (FLONASE) 50 MCG/ACT nasal spray Place into both nostrils as needed for allergies or rhinitis.    [provider]  furosemide  (LASIX ) 20 MG tablet Take 1 tablet (20 mg total) by mouth daily as needed. As needed for leg edema 02/07/24 05/07/24  Shlomo Wilbert SAUNDERS, MD  gabapentin (NEURONTIN) 100 MG capsule Take 100-200 mg by mouth at bedtime as needed. 04/02/23   [provider]  Lidocaine -Menthol  (LIDOCAINE -MENTHOL  ROLL-ON) 4-1 % LIQD Apply 1 application  topically 3 (three) times daily as needed (leg pain).    [provider]  loratadine (CLARITIN) 10 MG tablet Take 10 mg by mouth in the morning.    [provider]  LUTEIN PO Take 1 tablet by mouth daily. 25 mg    [provider]  methimazole  (TAPAZOLE ) 5 MG tablet Take 1 tablets 2 times a day. 01/28/24   Thapa, [place], MD  metoprolol  tartrate (LOPRESSOR ) 25 MG tablet TAKE 1 TABLET BY MOUTH ONCE DAILY AS NEEDED FOR  PALPITATIONS 02/07/24   Shlomo Wilbert SAUNDERS, MD  selenium 50 MCG TABS tablet Take 200 mcg by mouth daily.    [provider]     Family History  Problem Relation Age of Onset   Hypothyroidism Mother     Graves' disease Sister    Diabetes Brother    Diabetes Maternal Aunt     Social History   Socioeconomic History   Marital status: Single    Spouse name: Not on file   Number of children: Not on file   Years of education: Not on file   Highest education level: Not on file  Occupational History   Not on file  Tobacco Use   Smoking status: Never   Smokeless tobacco: Never  Vaping Use   Vaping status: Never Used  Substance and Sexual Activity   Alcohol use: No   Drug use: No   Sexual activity: Not Currently  Other Topics Concern   Not on file  Social History Narrative   Not on file   Social Drivers of Health   Financial Resource Strain: Not on file  Food Insecurity: Not on file  Transportation Needs: Not on file  Physical Activity: Not  on file  Stress: Not on file  Social Connections: Not on file    ECOG Status: 0 - Asymptomatic  Review of Systems  Constitutional: Negative.   Respiratory: Negative.    Cardiovascular: Negative.   Gastrointestinal: Negative.   Genitourinary: Negative.   Musculoskeletal:  Positive for back pain.  Neurological: Negative.     Review of Systems: A 12 point ROS discussed and pertinent positives are indicated in the HPI above.  All other systems are negative.   Physical Exam No direct physical exam was performed (except for noted visual exam findings with Video Visits).   Vital Signs: There were no vitals taken for this visit.  Imaging: ECHOCARDIOGRAM COMPLETE Result Date: 03/15/2024    ECHOCARDIOGRAM REPORT   Patient Name:   LOYCE FLAMING   Date of Exam: 03/15/2024 Medical Rec #:  [id number]     Height:       62.0 in Accession #:    [id number]    Weight:       208.8 lb Date of Birth:  [birth year]    BSA:          1.947 m Patient Age:    72 years      BP:           142/79 mmHg Patient Gender: F             HR:           60 bpm. Exam Location:  [address] Procedure: 2D Echo, 3D Echo, Cardiac Doppler and Color Doppler (Both Spectral             and Color Flow Doppler were utilized during procedure). Indications:    R01.1 Murmur  History:        Patient has prior history of Echocardiogram examinations, most                 recent 03/15/2023. CHF, Arrythmias:Paroxysmal Atrial                 Fibrillation.; Risk Factors:Hypertension. Pulmonary                 hypertension.  Sonographer:    Carl Rodgers-Jones RDCS Referring Phys: [id number] TRACI R TURNER IMPRESSIONS  1. Left ventricular ejection fraction, by estimation, is 60 to 65%. Left ventricular ejection fraction by 3D volume is 59 %. The left ventricle has normal function. The left ventricle has no regional wall motion abnormalities. Left ventricular diastolic  parameters are indeterminate.  2. Right ventricular systolic function is normal. The right ventricular size is normal. Mildly increased right ventricular wall thickness. There is mildly elevated pulmonary artery systolic pressure. The estimated right ventricular systolic pressure is 42.5 mmHg.  3. The mitral valve is degenerative. Mild mitral valve regurgitation. No evidence of mitral stenosis.  4. Tricuspid valve regurgitation is mild to moderate.  5. The aortic valve is tricuspid. Aortic valve regurgitation is trivial. Aortic valve sclerosis is present, with no evidence of aortic valve stenosis.  6. Aortic dilatation noted. There is dilatation of the ascending aorta, measuring 44 mm.  7. The inferior vena cava is dilated in size with <50% respiratory variability, suggesting right atrial pressure of 15 mmHg. Comparison(s): A prior study was performed on 03/15/2023. LVEF 60-65%, RVSP , and ascending aorta 43mm. FINDINGS  Left Ventricle: Left ventricular ejection fraction, by estimation, is 60 to 65%. Left ventricular ejection fraction by 3D volume is 59 %. The left ventricle has normal function. The left ventricle has no  regional wall motion abnormalities. The left ventricular internal cavity size was normal in size. There is no left  ventricular hypertrophy. Left ventricular diastolic parameters are indeterminate. Right Ventricle: The right ventricular size is normal. Mildly increased right ventricular wall thickness. Right ventricular systolic function is normal. There is mildly elevated pulmonary artery systolic pressure. The tricuspid regurgitant velocity is 2.62 m/s, and with an assumed right atrial pressure of 15 mmHg, the estimated right ventricular systolic pressure is 42.5 mmHg. Left Atrium: Left atrial size was normal in size. Right Atrium: Right atrial size was normal in size. Pericardium: There is no evidence of pericardial effusion. Mitral Valve: The mitral valve is degenerative in appearance. There is mild thickening of the mitral valve leaflet(s). Normal mobility of the mitral valve leaflets. Mild mitral annular calcification. Mild mitral valve regurgitation. No evidence of mitral  valve stenosis. Tricuspid Valve: The tricuspid valve is normal in structure. Tricuspid valve regurgitation is mild to moderate. No evidence of tricuspid stenosis. The aortic valve is tricuspid. Aortic valve regurgitation is trivial. Aortic valve sclerosis is present, with no evidence of aortic valve stenosis. Pulmonic Valve: The pulmonic valve was normal in structure. Pulmonic valve regurgitation is not visualized. No evidence of pulmonic stenosis. Aorta: The aortic root is normal in size and structure and aortic dilatation noted. There is dilatation of the ascending aorta, measuring 44 mm. Venous: The inferior vena cava is dilated in size with less than 50% respiratory variability, suggesting right atrial pressure of 15 mmHg. IAS/Shunts: The interatrial septum appears to be lipomatous. The atrial septum is grossly normal. Additional Comments: 3D was performed not requiring image post processing on an independent workstation and was normal.  LEFT VENTRICLE PLAX 2D LVIDd:         4.30 cm         Diastology LVIDs:         3.00 cm         LV e' medial:     6.53 cm/s LV PW:         0.80 cm         LV E/e' medial:  16.8 LV IVS:        0.80 cm         LV e' lateral:   8.54 cm/s LVOT diam:     2.00 cm         LV E/e' lateral: 12.8 LV SV:         59 LV SV Index:   30 LVOT Area:     3.14 cm        3D Volume EF                                LV 3D EF:    Left                                             ventricul                                             ar  ejection                                             fraction                                             by 3D                                             volume is                                             59 %.                                 3D Volume EF:                                3D EF:        59 %                                LV EDV:       123 ml                                LV ESV:       51 ml                                LV SV:        72 ml RIGHT VENTRICLE             IVC RV Basal diam:  3.10 cm     IVC diam: 2.10 cm RV S prime:     11.45 cm/s TAPSE (M-mode): 1.9 cm LEFT ATRIUM           Index        RIGHT ATRIUM           Index LA diam:      4.20 cm 2.16 cm/m   RA Area:     15.10 cm LA Vol (A4C): 36.2 ml 18.59 ml/m  RA Volume:   43.00 ml  22.09 ml/m  AORTIC VALVE LVOT Vmax:   77.05 cm/s LVOT Vmean:  52.150 cm/s LVOT VTI:    0.188 m AI PHT:      682 msec  AORTA Ao Root diam: 3.00 cm Ao Asc diam:  4.40 cm MITRAL VALVE                TRICUSPID VALVE MV Area (PHT): 2.99 cm     TR Peak grad:   27.5 mmHg MV Decel Time: 254 msec     TR Vmax:        262.00 cm/s MV E velocity: 109.50  cm/s MV A velocity: 111.50 cm/s  SHUNTS MV E/A ratio:  0.98         Systemic VTI:  0.19 m                             Systemic Diam: 2.00 cm Sunit Tolia Electronically signed by [employer] Signature Date/Time: 03/15/2024/7:37:27 PM    Final     Labs:  CBC: No results for input(s): WBC, HGB, HCT, PLT in the last 8760 hours.  COAGS: No results for input(s): INR,  APTT in the last 8760 hours.  BMP: Recent Labs    04/29/23 1017 02/07/24 1241  NA 138 139  K 5.3* 5.0  CL 104 100  CO2 27 19*  GLUCOSE 83 89  BUN 18 18  CALCIUM  9.8 9.3  CREATININE 1.11 1.15*    LIVER FUNCTION TESTS: Recent Labs    04/29/23 1017  BILITOT 0.5  AST 10  ALT 11  ALKPHOS 135*  PROT 6.5  ALBUMIN 4.2    Assessment and Plan:  I spoke with Ms. Foglesong by phone and reviewed MRI findings from 02/25/24 which demonstrate stable small lesions of the left kidney.  The largest measures 10-11 mm. These show no growth since 2022 and probable mild enhancement. I told Ms. Wesenberg that the lesions are likely benign given stability over 3 years. With her history of renal carcinoma in the right kidney, she would feel comfortable with another follow up scan. I recommended stretching the interval to two years until June or July of 2027 for a full 5 year follow up interval. She is in agreement with this plan.   Electronically Signed: Marcey ONEIDA Moan 04/10/2024, 4:46 PM    I spent a total of 10 Minutes in remote  clinical consultation, greater than 50% of which was counseling/coordinating care for left renal lesions.    Visit type: Audio only (telephone). Audio (no video) only due to patient's lack of internet/smartphone capability. Alternative for in-person consultation at [facility], [address] [place], [place], [place]. This visit type was conducted due to national recommendations for restrictions regarding the COVID-19 Pandemic (e.g. social distancing).  This format is felt to be most appropriate for this patient at this time.  All issues noted in this document were discussed and addressed.

## 2024-05-01 DIAGNOSIS — Z1330 Encounter for screening examination for mental health and behavioral disorders, unspecified: Secondary | ICD-10-CM | POA: Diagnosis not present

## 2024-05-01 DIAGNOSIS — M48061 Spinal stenosis, lumbar region without neurogenic claudication: Secondary | ICD-10-CM | POA: Diagnosis not present

## 2024-05-04 ENCOUNTER — Other Ambulatory Visit

## 2024-05-04 DIAGNOSIS — E0500 Thyrotoxicosis with diffuse goiter without thyrotoxic crisis or storm: Secondary | ICD-10-CM | POA: Diagnosis not present

## 2024-05-05 ENCOUNTER — Ambulatory Visit: Payer: Self-pay | Admitting: Endocrinology

## 2024-05-07 LAB — T4, FREE: Free T4: 1 ng/dL (ref 0.8–1.8)

## 2024-05-07 LAB — TRAB (TSH RECEPTOR BINDING ANTIBODY): TRAB: 11.88 IU/L — ABNORMAL HIGH (ref <=2.00)

## 2024-05-07 LAB — T3, FREE: T3, Free: 3.3 pg/mL (ref 2.3–4.2)

## 2024-05-07 LAB — TSH: TSH: 2.37 m[IU]/L (ref 0.40–4.50)

## 2024-05-11 ENCOUNTER — Ambulatory Visit (INDEPENDENT_AMBULATORY_CARE_PROVIDER_SITE_OTHER): Admitting: Endocrinology

## 2024-05-11 ENCOUNTER — Encounter: Payer: Self-pay | Admitting: Endocrinology

## 2024-05-11 VITALS — BP 138/80 | HR 67 | Resp 20 | Ht 62.0 in | Wt 209.6 lb

## 2024-05-11 DIAGNOSIS — H5789 Other specified disorders of eye and adnexa: Secondary | ICD-10-CM

## 2024-05-11 DIAGNOSIS — E0500 Thyrotoxicosis with diffuse goiter without thyrotoxic crisis or storm: Secondary | ICD-10-CM | POA: Diagnosis not present

## 2024-05-11 DIAGNOSIS — E079 Disorder of thyroid, unspecified: Secondary | ICD-10-CM | POA: Diagnosis not present

## 2024-05-11 MED ORDER — METHIMAZOLE 5 MG PO TABS: 5.0000 mg | ORAL_TABLET | Freq: Every day | ORAL | 3 refills | Status: AC

## 2024-05-11 NOTE — Progress Notes (Signed)
 Outpatient Endocrinology Note Christina Kesi Perrow, MD  05/11/24  Patient's Name: Christina [name]    DOB: [birth year]    MRN: [id number]  REASON OF VISIT: Follow-up for hyperthyroidism Danise' disease  PCP: Fernand Tracey LABOR, MD  HISTORY OF PRESENT ILLNESS:   Christina [name] is a 73 y.o. old female with past medical history as listed below is presented for a follow up  of hyperthyroidism Danise' disease.    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  He has positive thyrotropin receptor antibody, she had level up to 26 range.  Symptoms at the time of presentation was occasional palpitation, shortness of breath, hand tremors and paroxysmal atrial fibrillation.  Family history of hypothyroidism in mother. -Patient has been taking methimazole  since her diagnosis since September 2020. -Methimazole  dose has been adjusted periodically in the past.  # Patient has Graves' ophthalmopathy/Graves' eye disease, following with ophthalmology.  She was treated by oculoplastic ophthalmologist.  She was treated with steroid in the past.  She was treated with Tepezza  infusion as well.  She had second round of Tepezza  infusion in 2023 was stopped after her fourth infusion because of tinnitus.  Last infusion was in June 2023. CT scan of orbits has not shown any proptosis and only thickening of the eye muscles which has improved.  Interval history Patient has been taking methimazole  5 mg 2 times a day.  She denies palpitation and heat intolerance.  She is concerned about not able to lose weight actually slowly gaining weight.  She also has noticed losing more hair.  She has left knee pain with leg pain sometimes weakness of the leg.  She also has a low back pain problem.  She is concerned about if methimazole  is causing nerve damage.  Discussed that is unlikely to have normal localized problem with the methimazole .  It is more likely to be related to knee problem or  maybe back problem.  Recent lab normal as follows.  She also has trending down thyrotropin receptor antibody however still elevated.  She would like to see if decreasing dose of methimazole  will help for possible nerve issue and also maybe help her lose weight.  She has normal GI issues.  No recent illness or fever.  She denies redness or watering of the eyes.  She has been following with ophthalmology.   Latest Reference Range & Units 05/04/24 11:25  TSH 0.40 - 4.50 mIU/L 2.37  Triiodothyronine,Free,Serum 2.3 - 4.2 pg/mL 3.3  T4,Free(Direct) 0.8 - 1.8 ng/dL 1.0    Latest Reference Range & Units 04/29/23 10:17 05/04/24 11:25  Thyrotropin Receptor Ab 0.00 - 1.75 IU/L 23.40 (H)   TRAB <=2.00 IU/L  11.88 (H)  (H): Data is abnormally high   REVIEW OF SYSTEMS:  As per history of present illness.   PAST MEDICAL HISTORY: Past Medical History:  Diagnosis Date   Acquired dilation of ascending aorta and aortic root (HCC)    4.1 cm ascending aorta by MRI/MRA 06/2023   Arthritis    PAIN AND OA RIGHT KNEE; S/P LEFT TOTAL KNEE REPLACEMENT 05-07-14 - STILL IN PHYSICAL THERAPY   Asthma    hx of - PT STATES MILD - NO LONGER REQUIRES INHALERS   Cancer (HCC)    renal cell carcinoma   CHF (congestive heart failure) (HCC)    Colitis    mild    GERD (gastroesophageal reflux disease)    H/O hiatal hernia    Hypertension  hx of 10 years ago ; PT'S BLOOD PRESSURE RECENTLY ELEVATED WHILE IN HOSP FOR SURG- NOT ON ANY B/P MEDS   Hyperthyroidism    PAF (paroxysmal atrial fibrillation) (HCC)    Pulmonary hypertension (HCC)    PASP 45 mmHg by echo 01/2022.  Normal PA pressures on echo 03/11/2023    PAST SURGICAL HISTORY: Past Surgical History:  Procedure Laterality Date   IR RADIOLOGIST EVAL & MGMT  08/28/2021   IR RADIOLOGIST EVAL & MGMT  03/10/2022   IR RADIOLOGIST EVAL & MGMT  04/02/2023   IR RADIOLOGIST EVAL & MGMT  04/10/2024   KNEE CLOSED REDUCTION Left 06/05/2014   Procedure: CLOSED  MANIPULATION KNEE;  Surgeon: Donnice JONETTA Car, MD;  Location: [facility];  Service: Orthopedics;  Laterality: Left;   RIGHT/LEFT HEART CATH AND CORONARY ANGIOGRAPHY N/A 05/31/2019   Procedure: RIGHT/LEFT HEART CATH AND CORONARY ANGIOGRAPHY;  Surgeon: [place], Peter M, MD;  Location: [facility] INVASIVE CV LAB;  Service: Cardiovascular;  Laterality: N/A;   ROBOTIC ASSITED PARTIAL NEPHRECTOMY Right 03/07/2021   Procedure: XI ROBOTIC ASSITED RADICAL NEPHRECTOMY;  Surgeon: Devere Lonni Righter, MD;  Location: [facility];  Service: Urology;  Laterality: Right;   TOTAL KNEE ARTHROPLASTY Left 05/07/2014   Procedure: LEFT TOTAL KNEE ARTHROPLASTY;  Surgeon: Donnice JONETTA Car, MD;  Location: [facility];  Service: Orthopedics;  Laterality: Left;   TOTAL KNEE ARTHROPLASTY Right 06/05/2014   Procedure: RIGHT TOTAL KNEE ARTHROPLASTY;  Surgeon: Donnice JONETTA Car, MD;  Location: [facility];  Service: Orthopedics;  Laterality: Right;   WISDOM TEETH EXTRACTIONS      ALLERGIES: Allergies  Allergen Reactions   Hydrochlorothiazide Other (See Comments) and Hypertension    Wildly fluctuates B/P low-to-high, then high-to-low, in a matter of minutes   Hydrocodone -Acetaminophen  Nausea And Vomiting    Patient DOES NOT wish to take EVER again...   Other Nausea And Vomiting and Other (See Comments)    Stronger pain meds (post-op) caused dehydration and constipation    FAMILY HISTORY:  Family History  Problem Relation Age of Onset   Hypothyroidism Mother    Graves' disease Sister    Diabetes Brother    Diabetes Maternal Aunt     SOCIAL HISTORY: Social History   Socioeconomic History   Marital status: Single    Spouse name: Not on file   Number of children: Not on file   Years of education: Not on file   Highest education level: Not on file  Occupational History   Not on file  Tobacco Use   Smoking status: Never   Smokeless tobacco: Never  Vaping Use   Vaping status: Never Used  Substance and Sexual Activity   Alcohol use: No    Drug use: No   Sexual activity: Not Currently  Other Topics Concern   Not on file  Social History Narrative   Not on file   Social Drivers of Health   Financial Resource Strain: Low Risk  (05/01/2024)   Received from [facility]   Overall Financial Resource Strain (CARDIA)    How hard is it for you to pay for the very basics like food, housing, medical care, and heating?: Not hard at all  Food Insecurity: No Food Insecurity (05/01/2024)   [facility]   Hunger Vital Sign    Within the past 12 months, you worried that your food would run out before you got the money to buy more.: Never true    Within the past 12 months, the food you  bought just didn't last and you didn't have money to get more.: Never true  Transportation Needs: No Transportation Needs (05/01/2024)   Received from [facility] - Transportation    In the past 12 months, has lack of transportation kept you from medical appointments or from getting medications?: No    In the past 12 months, has lack of transportation kept you from meetings, work, or from getting things needed for daily living?: No  Physical Activity: Not on file  Stress: Not on file  Social Connections: Not on file    MEDICATIONS:  Current Outpatient Medications  Medication Sig Dispense Refill   acetaminophen  (TYLENOL ) 650 MG CR tablet Take 1,300 mg by mouth in the morning. Take 2 tablets (1300 mg) by mouth scheduled every morning & may take an additional dose (1300 mg) in the evening/afternoon if needed for pain.     adapalene (DIFFERIN) 0.1 % gel Apply 1 application topically at bedtime.     amlodipine -benazepril  (LOTREL) 2.5-10 MG capsule Take 1 capsule by mouth daily. 90 capsule 3   amoxicillin (AMOXIL) 500 MG capsule as directed. Dental visits only     APPLE CIDER VINEGAR PO Take 1 tablet by mouth daily. 1200 mg     celecoxib  (CELEBREX ) 200 MG capsule Take 200 mg by mouth daily as needed.     Dapsone  5 % topical gel  Apply 1 application topically in the morning. Apply to the face daily as directed     Flaxseed, Linseed, (FLAX SEED OIL PO) Take 1 tablet by mouth daily. 720 mg     fluticasone (FLONASE) 50 MCG/ACT nasal spray Place into both nostrils as needed for allergies or rhinitis.     furosemide  (LASIX ) 20 MG tablet Take 1 tablet (20 mg total) by mouth daily as needed. As needed for leg edema 90 tablet 3   gabapentin (NEURONTIN) 100 MG capsule Take 100-200 mg by mouth at bedtime as needed.     Lidocaine -Menthol  (LIDOCAINE -MENTHOL  ROLL-ON) 4-1 % LIQD Apply 1 application  topically 3 (three) times daily as needed (leg pain).     loratadine (CLARITIN) 10 MG tablet Take 10 mg by mouth in the morning.     LUTEIN PO Take 1 tablet by mouth daily. 25 mg     metoprolol  tartrate (LOPRESSOR ) 25 MG tablet TAKE 1 TABLET BY MOUTH ONCE DAILY AS NEEDED FOR  PALPITATIONS 90 tablet 3   selenium 50 MCG TABS tablet Take 200 mcg by mouth daily.     methimazole  (TAPAZOLE ) 5 MG tablet Take 1 tablet (5 mg total) by mouth daily. Take 1 tablets 2 times a day. 90 tablet 3   No current facility-administered medications for this visit.    PHYSICAL EXAM: Vitals:   05/11/24 1115  BP: 138/80  Pulse: 67  Resp: 20  SpO2: 98%  Weight: 209 lb 9.6 oz (95.1 kg)  Height: 5' 2 (1.575 m)   Body mass index is 38.34 kg/m.  Wt Readings from Last 3 Encounters:  05/11/24 209 lb 9.6 oz (95.1 kg)  02/07/24 208 lb 12.8 oz (94.7 kg)  01/28/24 210 lb 3.2 oz (95.3 kg)    General: Well developed, well nourished female in no apparent distress.  HEENT: AT/[place], no external lesions. Hearing intact to the spoken word Eyes: EOMI. No stare, proptosis or lid lag. Conjunctiva clear and no icterus. No erythema or watering Neck: Trachea midline, neck supple without appreciable thyromegaly or lymphadenopathy and no palpable thyroid  nodules Lungs: Clear to auscultation,  no wheeze. Respirations not labored Heart: S1S2, Regular in rate and rhythm.   Abdomen: Soft, non tender, non distended. Neurologic: Alert, oriented, normal speech, deep tendon biceps reflexes 2+,  no gross focal neurological deficit Extremities: No pedal pitting edema, no tremors of outstretched hands Skin: Warm, color good.  Psychiatric: Does not appear depressed or anxious  PERTINENT HISTORIC LABORATORY AND IMAGING STUDIES:  All pertinent laboratory results were reviewed. Please see HPI also for further details.   TSH  Date Value Ref Range Status  05/04/2024 2.37 0.40 - 4.50 mIU/L Final  01/25/2024 0.28 (L) 0.40 - 4.50 mIU/L Final  10/18/2023 0.38 (L) 0.40 - 4.50 mIU/L Final   T3, Total  Date Value Ref Range Status  08/22/2020 115 71 - 180 ng/dL Final    Lab Results  Component Value Date   FREET4 1.0 05/04/2024   FREET4 1.1 01/25/2024   FREET4 0.9 10/18/2023   T3FREE 3.3 05/04/2024   T3FREE 3.4 01/25/2024   T3FREE 3.1 10/18/2023   TSH 2.37 05/04/2024   TSH 0.28 (L) 01/25/2024   TSH 0.38 (L) 10/18/2023    Lab Results  Component Value Date   THYROTRECAB 23.40 (H) 04/29/2023   THYROTRECAB 15.00 (H) 09/01/2022   THYROTRECAB 26.60 (H) 04/20/2022    Lab Results  Component Value Date   TSH 2.37 05/04/2024   TSH 0.28 (L) 01/25/2024   TSH 0.38 (L) 10/18/2023   FREET4 1.0 05/04/2024   FREET4 1.1 01/25/2024   FREET4 0.9 10/18/2023     No results found for: TSI   No components found for: TRAB    ASSESSMENT / PLAN  1. Thyroid  eye disease   2. Graves disease     -Patient has hyperthyroidism secondary to Graves' disease.  She has been on methimazole  since September 2020.  Dose has been adjusted periodically. -She is currently taking methimazole  5 mg 2 times a day. -She is not a candidate for RAI I-131 treatment because of severe graves ophthalmopathy.  She does not want to go for thyroid  surgery. -Will continue with antithyroid medication/methimazole  at this time. -She has been following with ophthalmology for Graves' eye disease.  No  active Graves' eye disease today. - Patient thyroid  function test has normalized.  Thyrotropin receptor antibody has also been trending down.   Plan: - Trial of decreasing dose of methimazole  from 5 g 2 times a day to 5 mg daily. - She probably needs methimazole  more than 5 mg daily however she has concern currently regarding side effect of methimazole  and also a problem of difficulty losing weight. - Discussed that other option of antithyroid medication propylthiouracil will even have more side effects.  She is okay with stay on methimazole . - Will follow-up in 2 months with thyroid  function test.   Diagnoses and all orders for this visit:  Thyroid  eye disease  Graves disease -     methimazole  (TAPAZOLE ) 5 MG tablet; Take 1 tablet (5 mg total) by mouth daily. Take 1 tablets 2 times a day. -     T4, free -     T3, free -     TSH     DISPOSITION Follow up in clinic in 2 months suggested.  Labs prior to follow-up visit.  All questions answered and patient verbalized understanding of the plan.  Christina Franklin Cl[place]addle, MD [facility] Endocrinology [facility] [address] [place], [address] [place], [place] [address] Phone # [phone]   At least part of this note was generated using voice recognition  software. Inadvertent word errors may have occurred, which were not recognized during the proofreading process.

## 2024-05-16 DIAGNOSIS — R262 Difficulty in walking, not elsewhere classified: Secondary | ICD-10-CM | POA: Diagnosis not present

## 2024-05-16 DIAGNOSIS — M25562 Pain in left knee: Secondary | ICD-10-CM | POA: Diagnosis not present

## 2024-05-16 DIAGNOSIS — M5459 Other low back pain: Secondary | ICD-10-CM | POA: Diagnosis not present

## 2024-05-16 DIAGNOSIS — R531 Weakness: Secondary | ICD-10-CM | POA: Diagnosis not present

## 2024-05-17 DIAGNOSIS — R531 Weakness: Secondary | ICD-10-CM | POA: Diagnosis not present

## 2024-05-17 DIAGNOSIS — M25562 Pain in left knee: Secondary | ICD-10-CM | POA: Diagnosis not present

## 2024-05-17 DIAGNOSIS — M5459 Other low back pain: Secondary | ICD-10-CM | POA: Diagnosis not present

## 2024-05-17 DIAGNOSIS — R262 Difficulty in walking, not elsewhere classified: Secondary | ICD-10-CM | POA: Diagnosis not present

## 2024-05-23 DIAGNOSIS — R262 Difficulty in walking, not elsewhere classified: Secondary | ICD-10-CM | POA: Diagnosis not present

## 2024-05-23 DIAGNOSIS — M5459 Other low back pain: Secondary | ICD-10-CM | POA: Diagnosis not present

## 2024-05-23 DIAGNOSIS — R531 Weakness: Secondary | ICD-10-CM | POA: Diagnosis not present

## 2024-05-23 DIAGNOSIS — M25562 Pain in left knee: Secondary | ICD-10-CM | POA: Diagnosis not present

## 2024-05-25 DIAGNOSIS — M25562 Pain in left knee: Secondary | ICD-10-CM | POA: Diagnosis not present

## 2024-05-25 DIAGNOSIS — R531 Weakness: Secondary | ICD-10-CM | POA: Diagnosis not present

## 2024-05-25 DIAGNOSIS — R262 Difficulty in walking, not elsewhere classified: Secondary | ICD-10-CM | POA: Diagnosis not present

## 2024-05-25 DIAGNOSIS — M5459 Other low back pain: Secondary | ICD-10-CM | POA: Diagnosis not present

## 2024-05-29 DIAGNOSIS — R531 Weakness: Secondary | ICD-10-CM | POA: Diagnosis not present

## 2024-05-29 DIAGNOSIS — R262 Difficulty in walking, not elsewhere classified: Secondary | ICD-10-CM | POA: Diagnosis not present

## 2024-05-29 DIAGNOSIS — M25562 Pain in left knee: Secondary | ICD-10-CM | POA: Diagnosis not present

## 2024-05-29 DIAGNOSIS — M5459 Other low back pain: Secondary | ICD-10-CM | POA: Diagnosis not present

## 2024-05-30 DIAGNOSIS — M5459 Other low back pain: Secondary | ICD-10-CM | POA: Diagnosis not present

## 2024-05-30 DIAGNOSIS — M25562 Pain in left knee: Secondary | ICD-10-CM | POA: Diagnosis not present

## 2024-05-30 DIAGNOSIS — R531 Weakness: Secondary | ICD-10-CM | POA: Diagnosis not present

## 2024-05-30 DIAGNOSIS — R262 Difficulty in walking, not elsewhere classified: Secondary | ICD-10-CM | POA: Diagnosis not present

## 2024-06-05 DIAGNOSIS — M5459 Other low back pain: Secondary | ICD-10-CM | POA: Diagnosis not present

## 2024-06-05 DIAGNOSIS — R531 Weakness: Secondary | ICD-10-CM | POA: Diagnosis not present

## 2024-06-05 DIAGNOSIS — M25562 Pain in left knee: Secondary | ICD-10-CM | POA: Diagnosis not present

## 2024-06-05 DIAGNOSIS — R262 Difficulty in walking, not elsewhere classified: Secondary | ICD-10-CM | POA: Diagnosis not present

## 2024-06-07 DIAGNOSIS — R262 Difficulty in walking, not elsewhere classified: Secondary | ICD-10-CM | POA: Diagnosis not present

## 2024-06-07 DIAGNOSIS — M25562 Pain in left knee: Secondary | ICD-10-CM | POA: Diagnosis not present

## 2024-06-07 DIAGNOSIS — R531 Weakness: Secondary | ICD-10-CM | POA: Diagnosis not present

## 2024-06-07 DIAGNOSIS — M5459 Other low back pain: Secondary | ICD-10-CM | POA: Diagnosis not present

## 2024-06-12 DIAGNOSIS — R262 Difficulty in walking, not elsewhere classified: Secondary | ICD-10-CM | POA: Diagnosis not present

## 2024-06-12 DIAGNOSIS — M25562 Pain in left knee: Secondary | ICD-10-CM | POA: Diagnosis not present

## 2024-06-12 DIAGNOSIS — M5459 Other low back pain: Secondary | ICD-10-CM | POA: Diagnosis not present

## 2024-06-12 DIAGNOSIS — R531 Weakness: Secondary | ICD-10-CM | POA: Diagnosis not present

## 2024-06-14 DIAGNOSIS — M25562 Pain in left knee: Secondary | ICD-10-CM | POA: Diagnosis not present

## 2024-06-14 DIAGNOSIS — R262 Difficulty in walking, not elsewhere classified: Secondary | ICD-10-CM | POA: Diagnosis not present

## 2024-06-14 DIAGNOSIS — R531 Weakness: Secondary | ICD-10-CM | POA: Diagnosis not present

## 2024-06-14 DIAGNOSIS — M5459 Other low back pain: Secondary | ICD-10-CM | POA: Diagnosis not present

## 2024-06-19 DIAGNOSIS — M5459 Other low back pain: Secondary | ICD-10-CM | POA: Diagnosis not present

## 2024-06-19 DIAGNOSIS — R262 Difficulty in walking, not elsewhere classified: Secondary | ICD-10-CM | POA: Diagnosis not present

## 2024-06-19 DIAGNOSIS — R531 Weakness: Secondary | ICD-10-CM | POA: Diagnosis not present

## 2024-06-19 DIAGNOSIS — M25562 Pain in left knee: Secondary | ICD-10-CM | POA: Diagnosis not present

## 2024-06-21 DIAGNOSIS — R262 Difficulty in walking, not elsewhere classified: Secondary | ICD-10-CM | POA: Diagnosis not present

## 2024-06-21 DIAGNOSIS — M5459 Other low back pain: Secondary | ICD-10-CM | POA: Diagnosis not present

## 2024-06-21 DIAGNOSIS — R531 Weakness: Secondary | ICD-10-CM | POA: Diagnosis not present

## 2024-06-21 DIAGNOSIS — M25562 Pain in left knee: Secondary | ICD-10-CM | POA: Diagnosis not present

## 2024-06-26 DIAGNOSIS — M5416 Radiculopathy, lumbar region: Secondary | ICD-10-CM | POA: Diagnosis not present

## 2024-06-26 DIAGNOSIS — M48062 Spinal stenosis, lumbar region with neurogenic claudication: Secondary | ICD-10-CM | POA: Diagnosis not present

## 2024-06-26 DIAGNOSIS — M479 Spondylosis, unspecified: Secondary | ICD-10-CM | POA: Diagnosis not present

## 2024-06-27 ENCOUNTER — Ambulatory Visit
Admission: RE | Admit: 2024-06-27 | Discharge: 2024-06-27 | Disposition: A | Discharge status: Home / Self Care | Payer: Medicare Other | Source: Ambulatory Visit | Attending: Cardiology | Admitting: Cardiology

## 2024-06-27 DIAGNOSIS — I77810 Thoracic aortic ectasia: Secondary | ICD-10-CM

## 2024-06-27 DIAGNOSIS — I711 Thoracic aortic aneurysm, ruptured, unspecified: Secondary | ICD-10-CM | POA: Diagnosis not present

## 2024-06-27 MED ORDER — GADOPICLENOL 0.5 MMOL/ML IV SOLN
10.0000 mL | Freq: Once | INTRAVENOUS | Status: AC | PRN
  Administered 2024-06-27: 10 mL via INTRAVENOUS

## 2024-07-03 ENCOUNTER — Ambulatory Visit: Payer: Self-pay

## 2024-07-03 NOTE — Telephone Encounter (Signed)
 Attempted to call patient regarding chest MRI results. No answer, no updated DPR on file. Left message with no identifiers asking recipient to call [place] at our office number.

## 2024-07-04 ENCOUNTER — Telehealth: Payer: Self-pay | Admitting: Cardiology

## 2024-07-04 NOTE — Telephone Encounter (Signed)
 Santo Stanly LABOR, MD to Christina Geni CROME, RN [facility]    06/29/24  9:14 AM Mild ascending aortic dilation on non gated MRA.  In my review of 06/23/23 study I measured 44 mm at the ascending aorta in both studies (Flash sequence).  This, to me, doesn't not represent a greater than 3 mm increase in aortic size.  Repeat study in one year is recommended.  Went over the information above with the patient. Informed her that 3 mm increase is considered a slow rate of growth and interventions are not usually done until 5 cm or greater.  She wanted to make sure that it would be an MRI that is being repeated in one year and not a CT. 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  Informed her that I would send this information to her provider. She verbalized understanding.

## 2024-07-04 NOTE — Telephone Encounter (Signed)
 Patient was returning call. Please advise ?

## 2024-07-04 NOTE — Telephone Encounter (Signed)
 Pt was returning nurses call and is requesting a callback. Please advise

## 2024-07-07 NOTE — Telephone Encounter (Signed)
 Call to patient to advise that repeat testing in a year will be MRA/MRI. Patient verbalizes understanding.

## 2024-07-07 NOTE — Telephone Encounter (Signed)
 Please see 07/04/24 encounter.

## 2024-07-11 DIAGNOSIS — M5416 Radiculopathy, lumbar region: Secondary | ICD-10-CM | POA: Diagnosis not present

## 2024-07-17 ENCOUNTER — Other Ambulatory Visit

## 2024-07-17 DIAGNOSIS — E0500 Thyrotoxicosis with diffuse goiter without thyrotoxic crisis or storm: Secondary | ICD-10-CM | POA: Diagnosis not present

## 2024-07-17 LAB — T3, FREE: T3, Free: 3.3 pg/mL (ref 2.3–4.2)

## 2024-07-17 LAB — T4, FREE: Free T4: 1.2 ng/dL (ref 0.8–1.8)

## 2024-07-17 LAB — TSH: TSH: 0.53 m[IU]/L (ref 0.40–4.50)

## 2024-07-18 ENCOUNTER — Ambulatory Visit: Payer: Self-pay | Admitting: Endocrinology

## 2024-07-20 ENCOUNTER — Ambulatory Visit (INDEPENDENT_AMBULATORY_CARE_PROVIDER_SITE_OTHER): Admitting: Endocrinology

## 2024-07-20 ENCOUNTER — Encounter: Payer: Self-pay | Admitting: Endocrinology

## 2024-07-20 VITALS — BP 116/72 | HR 65 | Ht 62.0 in | Wt 211.2 lb

## 2024-07-20 DIAGNOSIS — H5789 Other specified disorders of eye and adnexa: Secondary | ICD-10-CM | POA: Diagnosis not present

## 2024-07-20 DIAGNOSIS — E0500 Thyrotoxicosis with diffuse goiter without thyrotoxic crisis or storm: Secondary | ICD-10-CM | POA: Diagnosis not present

## 2024-07-20 DIAGNOSIS — E079 Disorder of thyroid, unspecified: Secondary | ICD-10-CM

## 2024-07-20 NOTE — Progress Notes (Signed)
 Outpatient Endocrinology Note Christina Higdon, MD  07/20/24  Patient's Name: Christina [name]    DOB: May 26, 1951    MRN: [id number]  REASON OF VISIT: Follow-up for hyperthyroidism / Graves' [name]  PCP: Fernand Tracey LABOR, MD  HISTORY OF PRESENT ILLNESS:   [name] is a 73 y.o. old female with past medical history as listed below is presented for a follow up  of hyperthyroidism Christina [name].    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  He has positive thyrotropin receptor antibody, she had level up to 26 range.  Symptoms at the time of presentation was occasional palpitation, shortness of breath, hand tremors and paroxysmal atrial fibrillation.  Family history of hypothyroidism in mother. -Patient has been taking methimazole  since her diagnosis since September 2020. -Methimazole  dose has been adjusted periodically in the past.  # Patient has Graves' ophthalmopathy/Graves' eye [name], following with ophthalmology.  She was treated by oculoplastic ophthalmologist.  She was treated with steroid in the past.  She was treated with Tepezza  infusion as well.  She had second round of Tepezza  infusion in 2023 was stopped after her fourth infusion because of tinnitus.  Last infusion was in June 2023. CT scan of orbits has not shown any proptosis and only thickening of the eye muscles which has improved.  Methimazole  was decreased from 5 mg 2 times a day to 5 mg once a day in August 2025.  Interval history Has been taking methimazole  5 mg daily, taking in the morning before breakfast.  After decreasing the dose in August she has felt less sluggish otherwise no new symptoms.  Denies palpitation and heat intolerance.  Patient reports she had epidural cortisone injection last week for back pain and leg pain.  Recent thyroid  function test normal as follows.  No redness or watering of the eyes.  She used to follow-up Graves' eye [name] with  ophthalmology however she stated her regular ophthalmologist moved out of state.  She will let us  know if we need to refer her to new ophthalmologist for Graves' eye [name] monitoring and management.  No other complaints today.   Latest Reference Range & Units 07/17/24 11:33  TSH 0.40 - 4.50 mIU/L 0.53  Triiodothyronine,Free,Serum 2.3 - 4.2 pg/mL 3.3  T4,Free(Direct) 0.8 - 1.8 ng/dL 1.2   REVIEW OF SYSTEMS:  As per history of present illness.   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  Normal PA pressures on echo 03/11/2023    PAST SURGICAL HISTORY: Past Surgical History:  Procedure Laterality Date   IR RADIOLOGIST EVAL &  MGMT  08/28/2021   IR RADIOLOGIST EVAL & MGMT  03/10/2022   IR RADIOLOGIST EVAL & MGMT  04/02/2023   IR RADIOLOGIST EVAL & MGMT  04/10/2024   KNEE CLOSED REDUCTION Left 06/05/2014   Procedure: CLOSED MANIPULATION KNEE;  Surgeon: Donnice JONETTA Car, MD;  Location: [facility];  Service: Orthopedics;  Laterality: Left;   RIGHT/LEFT HEART CATH AND CORONARY ANGIOGRAPHY N/A 05/31/2019   Procedure: RIGHT/LEFT HEART CATH AND CORONARY ANGIOGRAPHY;  Surgeon: Jordan, Peter M, MD;  Location: [facility] IN[facility]ASIVE CV [facility];  Service: Cardiovascular;  Laterality: N/A;   ROBOTIC ASSITED PARTIAL NEPHRECTOMY  Right 03/07/2021   Procedure: XI ROBOTIC ASSITED RADICAL NEPHRECTOMY;  Surgeon: Devere Lonni Righter, MD;  Location: [facility];  Service: Urology;  Laterality: Right;   TOTAL KNEE ARTHROPLASTY Left 05/07/2014   Procedure: LEFT TOTAL KNEE ARTHROPLASTY;  Surgeon: Donnice JONETTA Car, MD;  Location: [facility];  Service: Orthopedics;  Laterality: Left;   TOTAL KNEE ARTHROPLASTY Right 06/05/2014   Procedure: RIGHT TOTAL KNEE ARTHROPLASTY;  Surgeon: Donnice JONETTA Car, MD;  Location: [facility];  Service: Orthopedics;  Laterality: Right;   WISDOM TEETH EXTRACTIONS      ALLERGIES: Allergies  Allergen Reactions   Hydrochlorothiazide Other (See Comments) and Hypertension    Wildly fluctuates B/P low-to-high, then high-to-low, in a matter of minutes   Hydrocodone -Acetaminophen  Nausea And Vomiting    Patient DOES NOT wish to take EVER again...   Other Nausea And Vomiting and Other (See Comments)    Stronger pain meds (post-op) caused dehydration and constipation    FAMILY HISTORY:  Family History  Problem Relation Age of Onset   Hypothyroidism Mother    Graves' [name] Sister    Diabetes Brother    Diabetes Maternal Aunt     SOCIAL HISTORY: Social History   Socioeconomic History   Marital status: Single    Spouse name: Not on file   Number of children: Not on file   Years of education: Not on file   Highest education level: Not on file  Occupational History   Not on file  Tobacco Use   Smoking status: Never   Smokeless tobacco: Never  Vaping Use   Vaping status: Never Used  Substance and Sexual Activity   Alcohol use: No   Drug use: No   Sexual activity: Not Currently  Other Topics Concern   Not on file  Social History Narrative   Not on file   Social Drivers of Health   Financial Resource Strain: Low Risk  (05/01/2024)   Received [facility]   Overall Financial Resource Strain (CARDIA)    How hard is it for you to pay for the very basics like food, housing, medical care, and  heating?: Not hard at all  Food Insecurity: No Food Insecurity (05/01/2024)   Received from [facility]   Hunger Vital Sign    Within the past 12 months, you worried that your food would run out before you got the money to buy more.: Never true    Within the past 12 months, the food you bought just didn't last and you didn't have money to get more.: Never true  Transportation Needs: No Transportation Needs (05/01/2024)   Received from [facility] - Transportation    In the past 12 months, has lack of transportation kept you from medical appointments or from getting medications?: No    In the past 12 months, has lack of transportation kept you from meetings, work, or from  getting things needed for daily living?: No  Physical Activity: Not on file  Stress: Not on file  Social Connections: Not on file    MEDICATIONS:  Current Outpatient Medications  Medication Sig Dispense Refill   acetaminophen  (TYLENOL ) 650 MG CR tablet Take 1,300 mg by mouth in the morning. Take 2 tablets (1300 mg) by mouth scheduled every morning & may take an additional dose (1300 mg) in the evening/afternoon if needed for pain.     adapalene (DIFFERIN) 0.1 % gel Apply 1 application topically at bedtime.     amlodipine -benazepril  (LOTREL) 2.5-10 MG capsule Take 1 capsule by mouth daily. 90 capsule 3   amoxicillin (AMOXIL) 500 MG capsule as directed. Dental visits only     APPLE CIDER VINEGAR PO Take 1 tablet by mouth daily. 1200 mg     celecoxib  (CELEBREX ) 200 MG capsule Take 200 mg by mouth daily as needed.     Dapsone  5 % topical gel Apply 1 application topically in the morning. Apply to the face daily as directed     Flaxseed, Linseed, (FLAX SEED OIL PO) Take 1 tablet by mouth daily. 720 mg     fluticasone (FLONASE) 50 MCG/ACT nasal spray Place into both nostrils as needed for allergies or rhinitis.     gabapentin (NEURONTIN) 100 MG capsule Take 100-200 mg by mouth at bedtime as needed.      Lidocaine -Menthol  (LIDOCAINE -MENTHOL  ROLL-ON) 4-1 % LIQD Apply 1 application  topically 3 (three) times daily as needed (leg pain).     loratadine (CLARITIN) 10 MG tablet Take 10 mg by mouth in the morning.     LUTEIN PO Take 1 tablet by mouth daily. 25 mg     methimazole  (TAPAZOLE ) 5 MG tablet Take 1 tablet (5 mg total) by mouth daily. Take 1 tablets 2 times a day. 90 tablet 3   metoprolol  tartrate (LOPRESSOR ) 25 MG tablet TAKE 1 TABLET BY MOUTH ONCE DAILY AS NEEDED FOR  PALPITATIONS 90 tablet 3   selenium 50 MCG TABS tablet Take 200 mcg by mouth daily.     furosemide  (LASIX ) 20 MG tablet Take 1 tablet (20 mg total) by mouth daily as needed. As needed for leg edema 90 tablet 3   No current facility-administered medications for this visit.    PHYSICAL EXAM: Vitals:   07/20/24 1056  BP: 116/72  Pulse: 65  SpO2: 98%  Weight: 211 lb 4 oz (95.8 kg)  Height: 5' 2 (1.575 m)    Body mass index is 38.64 kg/m.  Wt Readings from Last 3 Encounters:  07/20/24 211 lb 4 oz (95.8 kg)  05/11/24 209 lb 9.6 oz (95.1 kg)  02/07/24 208 lb 12.8 oz (94.7 kg)    General: Well developed, well nourished female in no apparent distress.  HEENT: AT/[place], no external lesions. Hearing intact to the spoken word Eyes:  No stare, proptosis or lid lag. Conjunctiva clear and no icterus. No erythema or watering Neck: Trachea midline, neck supple without appreciable thyromegaly or lymphadenopathy and no palpable thyroid  nodules Lungs: Clear to auscultation, no wheeze. Respirations not labored Heart: S1S2, Regular in rate and rhythm.  Abdomen: Soft, non tender, non distended. Neurologic: Alert, oriented, normal speech, deep tendon biceps reflexes 2 -3 +,  no gross focal neurological deficit Extremities: No pedal pitting edema, no tremors of outstretched hands Skin: Warm, color good.  Psychiatric: Does not appear depressed or anxious  PERTINENT HISTORIC LABORATORY AND IMAGING STUDIES:  All pertinent laboratory  results were reviewed. Please  see HPI also for further details.   TSH  Date Value Ref Range Status  07/17/2024 0.53 0.40 - 4.50 mIU/L Final  05/04/2024 2.37 0.40 - 4.50 mIU/L Final  01/25/2024 0.28 (L) 0.40 - 4.50 mIU/L Final   T3, Total  Date Value Ref Range Status  08/22/2020 115 71 - 180 ng/dL Final    Lab Results  Component Value Date   FREET4 1.2 07/17/2024   FREET4 1.0 05/04/2024   FREET4 1.1 01/25/2024   T3FREE 3.3 07/17/2024   T3FREE 3.3 05/04/2024   T3FREE 3.4 01/25/2024   TSH 0.53 07/17/2024   TSH 2.37 05/04/2024   TSH 0.28 (L) 01/25/2024    Lab Results  Component Value Date   THYROTRECAB 23.40 (H) 04/29/2023   THYROTRECAB 15.00 (H) 09/01/2022   THYROTRECAB 26.60 (H) 04/20/2022    Lab Results  Component Value Date   TSH 0.53 07/17/2024   TSH 2.37 05/04/2024   TSH 0.28 (L) 01/25/2024   FREET4 1.2 07/17/2024   FREET4 1.0 05/04/2024   FREET4 1.1 01/25/2024     No results found for: TSI   No components found for: TRAB      Latest Reference Range & Units 04/29/23 10:17 05/04/24 11:25  Thyrotropin Receptor Ab 0.00 - 1.75 IU/L 23.40 (H)   TRAB <=2.00 IU/L  11.88 (H)  (H): Data is abnormally high   ASSESSMENT / PLAN  1. Graves [name]   2. Thyroid  eye [name]    -Patient has hyperthyroidism secondary to Graves' [name].  She has been on methimazole  since September 2020.  Dose has been adjusted periodically. -She is currently taking methimazole  5 mg daily. -She is not a candidate for RAI I-131 treatment because of severe graves ophthalmopathy.  She does not want to go for thyroid  surgery. -Will continue with antithyroid medication/methimazole  at this time. -She has been following with ophthalmology for Graves' eye [name].  No active Graves' eye [name] today.  Patient will let us  know if she wants a new ophthalmologist, will refer at that time.  She reports her regular ophthalmologist moved out of state. -Patient thyroid  function test  normal.  Plan: -Continue current dose of methimazole  5 mg daily. -Check thyroid  function test prior to follow-up visit.  Follow-up in 4 months. - Asked to call our clinic with any question in between the visits.  Diagnoses and all orders for this visit:  Graves [name] -     T4, free -     T3, free -     TSH  Thyroid  eye [name]      DISPOSITION Follow up in clinic in 4 months suggested.  Labs prior to follow-up visit.  All questions answered and patient verbalized understanding of the plan.  Dameshia Seybold, MD [facility] Endocrinology [facility] [address] [place], [address] [place], [place] [address] Phone # [phone]   At least part of this note was generated using voice recognition software. Inadvertent word errors may have occurred, which were not recognized during the proofreading process.

## 2024-07-28 DIAGNOSIS — H40013 Open angle with borderline findings, low risk, bilateral: Secondary | ICD-10-CM | POA: Diagnosis not present

## 2024-07-28 DIAGNOSIS — H5213 Myopia, bilateral: Secondary | ICD-10-CM | POA: Diagnosis not present

## 2024-07-28 DIAGNOSIS — H2513 Age-related nuclear cataract, bilateral: Secondary | ICD-10-CM | POA: Diagnosis not present

## 2024-08-08 DIAGNOSIS — M5416 Radiculopathy, lumbar region: Secondary | ICD-10-CM | POA: Diagnosis not present

## 2024-08-08 DIAGNOSIS — M5116 Intervertebral disc disorders with radiculopathy, lumbar region: Secondary | ICD-10-CM | POA: Diagnosis not present

## 2024-08-30 DIAGNOSIS — M5416 Radiculopathy, lumbar region: Secondary | ICD-10-CM | POA: Diagnosis not present

## 2024-10-23 ENCOUNTER — Other Ambulatory Visit

## 2024-11-20 ENCOUNTER — Other Ambulatory Visit

## 2024-11-22 ENCOUNTER — Ambulatory Visit: Admitting: Endocrinology
